# Patient Record
Sex: Male | Born: 1950
Health system: Southern US, Community
[De-identification: ages and names within clinical notes are randomized; demographics above are authoritative.]

## PROBLEM LIST (undated history)

## (undated) DIAGNOSIS — Z86718 Personal history of other venous thrombosis and embolism: Secondary | ICD-10-CM

## (undated) DIAGNOSIS — Z87442 Personal history of urinary calculi: Secondary | ICD-10-CM

## (undated) DIAGNOSIS — I499 Cardiac arrhythmia, unspecified: Secondary | ICD-10-CM

## (undated) DIAGNOSIS — E232 Diabetes insipidus: Secondary | ICD-10-CM

## (undated) DIAGNOSIS — Z950 Presence of cardiac pacemaker: Secondary | ICD-10-CM

## (undated) DIAGNOSIS — N183 Chronic kidney disease, stage 3 unspecified: Secondary | ICD-10-CM

## (undated) DIAGNOSIS — T56891A Toxic effect of other metals, accidental (unintentional), initial encounter: Secondary | ICD-10-CM

## (undated) DIAGNOSIS — F329 Major depressive disorder, single episode, unspecified: Secondary | ICD-10-CM

## (undated) DIAGNOSIS — E785 Hyperlipidemia, unspecified: Secondary | ICD-10-CM

## (undated) DIAGNOSIS — F32A Depression, unspecified: Secondary | ICD-10-CM

## (undated) DIAGNOSIS — Z86711 Personal history of pulmonary embolism: Secondary | ICD-10-CM

## (undated) DIAGNOSIS — F319 Bipolar disorder, unspecified: Secondary | ICD-10-CM

## (undated) HISTORY — DX: Presence of cardiac pacemaker: Z95.0

## (undated) HISTORY — DX: Cardiac arrhythmia, unspecified: I49.9

## (undated) HISTORY — DX: Personal history of urinary calculi: Z87.442

## (undated) HISTORY — PX: NOSE SURGERY: SHX723

## (undated) HISTORY — PX: SPINE SURGERY: SHX786

## (undated) HISTORY — DX: Diabetes insipidus: E23.2

## (undated) HISTORY — DX: Personal history of pulmonary embolism: Z86.711

## (undated) HISTORY — PX: BACK SURGERY: SHX140

## (undated) HISTORY — PX: CARDIAC SURGERY: SHX584

## (undated) HISTORY — PX: PACEMAKER PLACEMENT: SHX43

## (undated) HISTORY — DX: Hyperlipidemia, unspecified: E78.5

## (undated) HISTORY — DX: Personal history of other venous thrombosis and embolism: Z86.718

---

## 2004-01-02 ENCOUNTER — Emergency Department (HOSPITAL_COMMUNITY): Admission: EM | Admit: 2004-01-02 | Discharge: 2004-01-02 | Payer: Self-pay | Admitting: Emergency Medicine

## 2005-01-03 ENCOUNTER — Inpatient Hospital Stay (HOSPITAL_COMMUNITY): Admission: RE | Admit: 2005-01-03 | Discharge: 2005-01-09 | Payer: Self-pay | Admitting: Psychiatry

## 2005-01-03 ENCOUNTER — Ambulatory Visit: Payer: Self-pay | Admitting: Psychiatry

## 2005-01-03 ENCOUNTER — Emergency Department (HOSPITAL_COMMUNITY): Admission: EM | Admit: 2005-01-03 | Discharge: 2005-01-03 | Payer: Self-pay | Admitting: Emergency Medicine

## 2006-02-28 ENCOUNTER — Ambulatory Visit (HOSPITAL_COMMUNITY): Payer: Self-pay | Admitting: *Deleted

## 2006-04-19 ENCOUNTER — Ambulatory Visit (HOSPITAL_COMMUNITY): Payer: Self-pay | Admitting: *Deleted

## 2006-06-12 ENCOUNTER — Ambulatory Visit (HOSPITAL_COMMUNITY): Payer: Self-pay | Admitting: *Deleted

## 2006-08-21 ENCOUNTER — Ambulatory Visit (HOSPITAL_COMMUNITY): Payer: Self-pay | Admitting: *Deleted

## 2006-11-20 ENCOUNTER — Ambulatory Visit (HOSPITAL_COMMUNITY): Payer: Self-pay | Admitting: *Deleted

## 2007-02-19 ENCOUNTER — Ambulatory Visit (HOSPITAL_COMMUNITY): Payer: Self-pay | Admitting: *Deleted

## 2007-06-04 ENCOUNTER — Ambulatory Visit (HOSPITAL_COMMUNITY): Payer: Self-pay | Admitting: *Deleted

## 2007-09-03 ENCOUNTER — Ambulatory Visit (HOSPITAL_COMMUNITY): Payer: Self-pay | Admitting: *Deleted

## 2007-12-12 ENCOUNTER — Ambulatory Visit (HOSPITAL_COMMUNITY): Payer: Self-pay | Admitting: *Deleted

## 2008-03-10 ENCOUNTER — Ambulatory Visit (HOSPITAL_COMMUNITY): Payer: Self-pay | Admitting: *Deleted

## 2008-06-09 ENCOUNTER — Ambulatory Visit (HOSPITAL_COMMUNITY): Payer: Self-pay | Admitting: *Deleted

## 2008-11-19 ENCOUNTER — Ambulatory Visit: Payer: Self-pay

## 2008-11-23 ENCOUNTER — Ambulatory Visit: Payer: Self-pay

## 2008-11-30 ENCOUNTER — Ambulatory Visit: Payer: Self-pay

## 2009-01-26 ENCOUNTER — Ambulatory Visit (HOSPITAL_COMMUNITY): Payer: Self-pay | Admitting: *Deleted

## 2009-03-23 ENCOUNTER — Ambulatory Visit (HOSPITAL_COMMUNITY): Payer: Self-pay | Admitting: *Deleted

## 2009-06-16 DIAGNOSIS — I2510 Atherosclerotic heart disease of native coronary artery without angina pectoris: Secondary | ICD-10-CM | POA: Insufficient documentation

## 2009-07-26 ENCOUNTER — Ambulatory Visit (HOSPITAL_COMMUNITY): Payer: Self-pay | Admitting: Psychiatry

## 2009-08-30 ENCOUNTER — Ambulatory Visit (HOSPITAL_COMMUNITY): Payer: Self-pay | Admitting: Psychiatry

## 2009-10-25 ENCOUNTER — Ambulatory Visit (HOSPITAL_COMMUNITY): Payer: Self-pay | Admitting: Psychiatry

## 2009-12-22 ENCOUNTER — Ambulatory Visit (HOSPITAL_COMMUNITY): Payer: Self-pay | Admitting: Psychiatry

## 2010-03-07 ENCOUNTER — Ambulatory Visit (HOSPITAL_COMMUNITY): Payer: Self-pay | Admitting: Psychiatry

## 2010-05-25 ENCOUNTER — Ambulatory Visit (HOSPITAL_COMMUNITY): Payer: Self-pay | Admitting: Psychiatry

## 2010-08-08 ENCOUNTER — Ambulatory Visit (HOSPITAL_COMMUNITY): Payer: Self-pay | Admitting: Psychiatry

## 2010-08-15 ENCOUNTER — Ambulatory Visit: Payer: Self-pay | Admitting: Family Medicine

## 2010-10-04 ENCOUNTER — Ambulatory Visit: Payer: Self-pay | Admitting: Unknown Physician Specialty

## 2010-10-31 ENCOUNTER — Ambulatory Visit (HOSPITAL_COMMUNITY)
Admission: RE | Admit: 2010-10-31 | Discharge: 2010-10-31 | Payer: Self-pay | Source: Home / Self Care | Attending: Psychiatry | Admitting: Psychiatry

## 2010-12-09 ENCOUNTER — Ambulatory Visit: Payer: Self-pay | Admitting: Unknown Physician Specialty

## 2010-12-12 LAB — PATHOLOGY REPORT

## 2011-01-30 ENCOUNTER — Encounter (HOSPITAL_COMMUNITY): Payer: Medicare Other | Admitting: Psychiatry

## 2011-01-30 DIAGNOSIS — F3189 Other bipolar disorder: Secondary | ICD-10-CM

## 2011-03-03 NOTE — H&P (Signed)
Jeffrey Herring, Jeffrey Herring NO.:  [id number]   MEDICAL RECORD NO.:  [id number]          PATIENT TYPE:  IPS   LOCATION:  0400                          FACILITY:  BH   PHYSICIAN:  Carlton Adam, M.D.      DATE OF BIRTH:  17-Apr-1951   DATE OF ADMISSION:  01/03/2005  DATE OF DISCHARGE:                         PSYCHIATRIC ADMISSION ASSESSMENT   IDENTIFYING INFORMATION:  This is a 60 year old married white male who was  voluntarily admitted on January 03, 2005.   HISTORY OF PRESENT ILLNESS:  The patient presents with a history of  depression, catatonic features and was shutting down at home, hypersomnia,  decreased ADLs, staring into space.  The patient had seen his psychiatrist  and felt that patient required inpatient admission.  The patient reports  that he is just tired of being on so many medications in the past with her  side effects and states that his current medication, Xanax, is what works  the best.  He states that he is not stupid enough to hurt himself.  He has  had some problems with variations in his appetite and reports no reported  weight loss.  His stressors include patient reports that his daughter has a  speeding ticket.  The patient will need to pay for her lawyer fees.  He also  has some financial issues.  The patient denies any psychotic symptoms.  Denies any current suicidal or homicidal thoughts.  He does state that his  wife feels that he should be doing more and stating what, jumping jacks?  The patient states that he also does not feel like he needs to hear.   PAST PSYCHIATRIC HISTORY:  First admission to [facility]  No  other psychiatric admissions.  He sees Dr. Randye Lobo as an outpatient.  No history of a suicide attempt.   SOCIAL HISTORY:  This is a 60 year old married white male, married for 28  years, first marriage.  Lives with his wife, has two children, ages 49 and  44.  Unemployed.  Was a Engineer, structural in the past.   Attempting to get  social security, disability.  Recently moved from [place] to [place]  approximately one year ago.  Reports he has some family here, which is his  brother.   FAMILY HISTORY:  None.   ALCOHOL/DRUG HISTORY:  The patient smokes.  Denies any alcohol use.  Denies  any drug use although urine drug screen was positive for marijuana.   PRIMARY CARE PHYSICIAN:  None.   MEDICAL PROBLEMS:  The patient has a pacemaker that was placed in 1980s for  episodes of bradycardia.  He has no current cardiologist.  Denies any other  further medical problems.   MEDICATIONS:  Has been on Lexapro, Zoloft, Wellbutrin, Risperdal, Abilify  and reports many others in the past.  His Abilify was discontinued  approximately two days ago.  The patient was reporting some akathisia with  that.  He is currently on Xanax, taking up to three times a day.  He states  he may take an extra one every now and then  for his complaints of anxiety.   ALLERGIES:  ASPIRIN.   PHYSICAL EXAMINATION:  The patient was assessed at [facility].  He is a  middle-aged, well-nourished male.  A little pale.  His temperature is 97.3,  heart rate 95, respirations 20, blood pressure 129/79.  He is 171 pounds.   LABORATORY DATA:  His urine drug screen was positive for benzodiazepines,  positive for THC.  His platelet count is 108.  Alcohol level is less than 5.  His BMET was within normal limits.   MENTAL STATUS EXAM:  He is an alert, middle-aged, cooperative male.  Fair  eye contact.  Neat in appearance.  Sitting on the side of the bed.  There is  poverty of speech.  No initiating of any conversation.  The patient feels  anxious.  He is constricted, smiles sometimes inappropriately.  Thought  processes are coherent.  No evidence of psychosis.  Cognitive function  intact.  Memory is fair.  The patient gets his medication often confused  with other medications that he has been on in the past.  Judgment is fair.   Insight is fair.   DIAGNOSES:   AXIS I:  1.  Rule out mood disorder not otherwise specified.  2.  THC abuse.   AXIS II:  Deferred.   AXIS III:  Pacemaker.   AXIS IV:  Problems with economics, other psychosocial problems.   AXIS V:  Current 30-35; estimated this past year 62.   PLAN:  To stabilize mood and thinking.  The patient will initially be placed  on the [place] for close observation.  Will have a family session as soon  as possible with wife for concerns.  We will also repeat patient's CBC and  obtain a liver function test.  The patient is to follow up with Dr. Tamala Julian.   TENTATIVE LENGTH OF STAY:  Three to four days.      JO/MEDQ  D:  01/04/2005  T:  01/05/2005  Job:  [id number]

## 2011-03-03 NOTE — Discharge Summary (Signed)
Jeffrey Herring, NOVOSAD NO.:  [id number]   MEDICAL RECORD NO.:  [id number]          PATIENT TYPE:  IPS   LOCATION:  [id number]                          FACILITY:  BH   PHYSICIAN:  Carlton Adam, M.D.      DATE OF BIRTH:  November 10, 1950   DATE OF ADMISSION:  01/03/2005  DATE OF DISCHARGE:  01/09/2005                                 DISCHARGE SUMMARY   CHIEF COMPLAINT AND PRESENT ILLNESS:  This was the first admission to [facility] for this 60 year old married white male voluntarily  admitted.  History of depression, catatonic features, was shutting down at  home, hypersomnia, decreased ADLs, just staring in space.  Has seen a  psychiatrist.  Kristeen Miss that the patient required inpatient admission.  Endorsed  that he was just tired of being on so many medications in the past with the  side effects.  Endorsed that his current medication, Xanax, is what worked  the best.  Endorsed that he was not really wanting to help himself.  Endorsed decreased appetite but no weight loss.  He reported his stressors  that his daughter had a speeding ticket.  He will need to pay for her lawyer  fees.  Has some financial difficulties.  He did endorse that his wife felt  that she should be doing more.   PAST PSYCHIATRIC HISTORY:  First time at [facility].  No other  psychiatric admissions.  Seeing Dr. Randye Lobo on an outpatient basis.   ALCOHOL/DRUG HISTORY:  Denies any alcohol or substance use, although the  drug screen was positive for marijuana.   MEDICAL HISTORY:  Pacemaker placed in 1984, bradycardia.   MEDICATIONS:  Lexapro, Zoloft, Wellbutrin, Risperdal, Abilify and many  others in the past.  Abilify was discontinued two days prior to this  admission.  Reports some akathisia with that.  Currently on Xanax, taking it  three times a day.   PHYSICAL EXAMINATION:  Performed and failed to show any acute findings.   LABORATORY DATA:  Drug screen positive for  marijuana and benzodiazepines.  CBC within normal limits.  Blood chemistries within normal limits.  TSH  0.976.   MENTAL STATUS EXAM:  Alert male.  Cooperative.  Fair eye contact.  Neat in  appearance.  Sitting on the side of the bed.  Not as spontaneous, poverty of  speech, not initiating any conversation.  Some psychomotor retardation.  He  did endorse feeling anxious.  Affect was constricted but smiles sometimes  inappropriately.  Thought processes were logical, coherent and relevant,  although there were scars.  No evidence of delusions.  Endorsed no suicidal  or homicidal ideation.  No hallucinations.  Cognition was well-preserved.   ADMISSION DIAGNOSES:   AXIS I:  1.  Major depression.  2.  Anxiety disorder not otherwise specified.  3.  Marijuana abuse.   AXIS II:  No diagnosis.   AXIS III:  Pacemaker.   AXIS IV:  Moderate.   AXIS V:  Global Assessment of Functioning upon admission 30-35; highest  Global Assessment of Functioning in the last year 91.  HOSPITAL COURSE:  He was admitted and started in individual and group  psychotherapy.  He was given Ambien for sleep.  He was maintained on Xanax 1  mg three times a day and 1.5 mg at night.  Initially, he was going to be  detoxed from the Xanax but, given the fact that this was the only medication  that he felt was useful, we went ahead and kept it.  He was placed on Prozac  10 mg per day and he was started on lithium 300 mg twice a day.  He did  endorsed that he experienced severe anxiety and the Xanax is the only  medication that helps him.  He used to live in [place].  Came to this area  when he lost his job.  Became incapacitated.  He has not been able to  function in part secondary to all the medication that he had been trying  before.  Cogentin did help his presentation.  Thought that he was having  some side effects from the Abilify and it was discontinued.  We continued to  stabilize.  There was a family session  with the wife.  Wife reported that he  was on Zyprexa.  One year prior to this admission, he stopped taking it due  to improvement.  Approximately two days later, symptoms resembling a heart  attack started.  Depression seemed to have worsened.  At home, wife reported  that he was inactive, apathetic, unmotivated.  He was able to validate the  fact that he was more withdrawn and depressed as wife perceived him to be.  He was starting to be clearer.  Still with some psychomotor retardation.  Slow processing but willing to pursue the medication.  We started the Prozac  and the lithium.  Slowly, his mood improved.  His affect was somewhat  brighter, more spontaneous.  Able to initiate conversation.  Some concerns  about being in the unit.  Overall, he was improving.  On March 27th, he was  in full contact with reality.  There were no suicidal ideation, no homicidal  ideation, no hallucinations, no delusions.  He was willing and motivated to  pursue further outpatient treatment and pursue these medications further.   DISCHARGE DIAGNOSES:   AXIS I:  1.  Bipolar disorder, depressed.  2.  Anxiety disorder not otherwise specified.   AXIS II:  No diagnosis.   AXIS III:  Pacemaker.   AXIS IV:  Moderate.   AXIS V:  Global Assessment of Functioning upon discharge 50-55.   DISCHARGE MEDICATIONS:  1.  Xanax 1 mg three times a day and 1.5 mg at night.  2.  Prozac 10 mg daily.  3.  Lithium carbonate 300 mg, 1 twice a day.   FOLLOW UP:  [facility].      IL/MEDQ  D:  01/31/2005  T:  01/31/2005  Job:  [id number]

## 2011-05-01 ENCOUNTER — Encounter (INDEPENDENT_AMBULATORY_CARE_PROVIDER_SITE_OTHER): Payer: Medicare Other | Admitting: Psychiatry

## 2011-05-01 DIAGNOSIS — F3189 Other bipolar disorder: Secondary | ICD-10-CM

## 2011-05-22 ENCOUNTER — Encounter (HOSPITAL_COMMUNITY): Payer: Medicare Other | Admitting: Psychiatry

## 2011-05-31 ENCOUNTER — Encounter (INDEPENDENT_AMBULATORY_CARE_PROVIDER_SITE_OTHER): Payer: Medicare Other | Admitting: Psychiatry

## 2011-05-31 DIAGNOSIS — F3189 Other bipolar disorder: Secondary | ICD-10-CM

## 2011-07-26 ENCOUNTER — Encounter (HOSPITAL_COMMUNITY): Payer: Medicare Other | Admitting: Psychiatry

## 2011-07-31 ENCOUNTER — Encounter (INDEPENDENT_AMBULATORY_CARE_PROVIDER_SITE_OTHER): Payer: Medicare Other | Admitting: Psychiatry

## 2011-07-31 DIAGNOSIS — F3189 Other bipolar disorder: Secondary | ICD-10-CM

## 2011-09-01 ENCOUNTER — Other Ambulatory Visit (HOSPITAL_COMMUNITY): Payer: Self-pay | Admitting: Psychiatry

## 2011-09-27 ENCOUNTER — Ambulatory Visit (INDEPENDENT_AMBULATORY_CARE_PROVIDER_SITE_OTHER): Payer: Medicare Other | Admitting: Psychiatry

## 2011-09-27 ENCOUNTER — Encounter (HOSPITAL_COMMUNITY): Payer: Self-pay | Admitting: Psychiatry

## 2011-09-27 VITALS — Wt 195.0 lb

## 2011-09-27 DIAGNOSIS — F3189 Other bipolar disorder: Secondary | ICD-10-CM

## 2011-09-27 MED ORDER — TRAZODONE HCL 100 MG PO TABS: 100.0000 mg | ORAL_TABLET | Freq: Every day | ORAL | Status: DC

## 2011-09-27 MED ORDER — FLUOXETINE HCL 10 MG PO CAPS: 10.0000 mg | ORAL_CAPSULE | Freq: Every day | ORAL | Status: DC

## 2011-09-27 MED ORDER — ALPRAZOLAM 1 MG PO TABS: 1.0000 mg | ORAL_TABLET | Freq: Three times a day (TID) | ORAL | Status: DC | PRN

## 2011-09-27 MED ORDER — LITHIUM CARBONATE 300 MG PO CAPS: ORAL_CAPSULE | ORAL | Status: DC

## 2011-09-27 NOTE — Progress Notes (Signed)
Jeffrey [id number]  Subjective Patient came for his followup appointment with his wife. He continued to endorse poor sleep anxiety irritability and some mood swings. He is taking lithium 900 mg along with Prozac 10 mg and trazodone. Patient reported his granddaughter is doing better in gaining weight. He reported no side effects of medication. There no tremors shakes present today he has a blood work for his liver enzymes as patient is also taking simvastatin from his primary care physician.  Mental Status Examination Patient is casually dressed and fairly groomed. His speech is somewhat fast and rapid. He denies any auditory or visual hallucination. His attention and concentration is distracted. His thought process is fast but logical linear and goal-directed. He denies any active or passive suicidal thoughts or homicidal thoughts. There no psychotic symptoms present. Is alert and oriented x3. His insight judgment and impulse control is okay  Current Medication Current outpatient prescriptions:ALPRAZolam (XANAX) 1 MG tablet, Take 1 tablet (1 mg total) by mouth 3 (three) times daily as needed., Disp: 90 tablet, Rfl: 1;  FLUoxetine (PROZAC) 10 MG capsule, Take 1 capsule (10 mg total) by mouth daily., Disp: 30 capsule, Rfl: 1;  lithium carbonate 300 MG capsule, Take 2 am and 2 hs, Disp: 120 capsule, Rfl: 1 traZODone (DESYREL) 100 MG tablet, Take 1 tablet (100 mg total) by mouth at bedtime., Disp: 30 tablet, Rfl: 1   Assessment Bipolar disorder NOS  Plan I recommended to try increased dose of lithium 600 mg twice a day. So far he has been tolerating lithium without any side effects. His last lithium level was 0.7. I recommended increase lithium may helpless residual mood swing insomnia and anxiety. I recommended to call us if he is a question or concern about the medication or if he feel worsening of her symptoms. I have explained risks and benefits of medication. I will see him again in  4-6 weeks

## 2011-10-26 ENCOUNTER — Other Ambulatory Visit (HOSPITAL_COMMUNITY): Payer: Self-pay | Admitting: Psychiatry

## 2011-10-26 DIAGNOSIS — F3189 Other bipolar disorder: Secondary | ICD-10-CM

## 2011-10-27 ENCOUNTER — Other Ambulatory Visit (HOSPITAL_COMMUNITY): Payer: Self-pay | Admitting: Psychiatry

## 2011-11-24 ENCOUNTER — Other Ambulatory Visit (HOSPITAL_COMMUNITY): Payer: Self-pay | Admitting: Psychiatry

## 2011-11-26 ENCOUNTER — Other Ambulatory Visit (HOSPITAL_COMMUNITY): Payer: Self-pay | Admitting: Psychiatry

## 2011-11-28 ENCOUNTER — Other Ambulatory Visit (HOSPITAL_COMMUNITY): Payer: Self-pay | Admitting: Psychiatry

## 2011-12-12 DIAGNOSIS — Z950 Presence of cardiac pacemaker: Secondary | ICD-10-CM | POA: Diagnosis not present

## 2011-12-12 DIAGNOSIS — I2510 Atherosclerotic heart disease of native coronary artery without angina pectoris: Secondary | ICD-10-CM | POA: Diagnosis not present

## 2011-12-12 DIAGNOSIS — Z45018 Encounter for adjustment and management of other part of cardiac pacemaker: Secondary | ICD-10-CM | POA: Diagnosis not present

## 2011-12-12 DIAGNOSIS — Q246 Congenital heart block: Secondary | ICD-10-CM | POA: Diagnosis not present

## 2011-12-12 DIAGNOSIS — E7800 Pure hypercholesterolemia, unspecified: Secondary | ICD-10-CM | POA: Diagnosis not present

## 2011-12-27 ENCOUNTER — Encounter (HOSPITAL_COMMUNITY): Payer: Self-pay | Admitting: Psychiatry

## 2011-12-27 ENCOUNTER — Ambulatory Visit (INDEPENDENT_AMBULATORY_CARE_PROVIDER_SITE_OTHER): Payer: Medicare Other | Admitting: Psychiatry

## 2011-12-27 VITALS — BP 125/85 | HR 85 | Wt 188.0 lb

## 2011-12-27 DIAGNOSIS — F319 Bipolar disorder, unspecified: Secondary | ICD-10-CM

## 2011-12-27 MED ORDER — LITHIUM CARBONATE 300 MG PO CAPS: ORAL_CAPSULE | ORAL | Status: DC

## 2011-12-27 MED ORDER — TRAZODONE HCL 100 MG PO TABS: 100.0000 mg | ORAL_TABLET | Freq: Every day | ORAL | Status: DC

## 2011-12-27 MED ORDER — ALPRAZOLAM 1 MG PO TABS: 1.0000 mg | ORAL_TABLET | Freq: Three times a day (TID) | ORAL | Status: DC | PRN

## 2011-12-27 MED ORDER — FLUOXETINE HCL 10 MG PO CAPS: 10.0000 mg | ORAL_CAPSULE | Freq: Every day | ORAL | Status: DC

## 2011-12-27 NOTE — Progress Notes (Signed)
Jeffrey LehmanOctober 06, [id number]  Chief complaint Medication management  History of present illness Patient is 61 year old Caucasian married male who came with his wife for his followup appointment. Patient admitted noncompliance with his medication for one day and that resulted severe anger paranoid thinking and poor sleep. Patient admitted that he forgot to take his medication as he was very busy doing things. However overall he has been stable on his medication. I endorse that he has chronic insomnia otherwise his mood and thinking is much improved. He is taking his medication recently without any interruption. He denies any side effects of medication. He denies drinking or using any illegal substances. He has any agitation anger or any violent behavior.  Current psychiatric medication Xanax 1 mg 3 times a day Prozac 10 mg daily Lithium 300 mg 1 in the morning and 2 at bedtime Trazodone 100 mg at bedtime  Mental status examination Patient is casually dressed and fairly groomed. He is cooperative and maintained good eye contact. His speech is somewhat fast and rapid. He denies any auditory or visual hallucination. His attention and concentration is distracted. His thought process is fast but logical linear and goal-directed. He denies any active or passive suicidal thoughts or homicidal thoughts. There no psychotic symptoms present. Is alert and oriented x3. His insight judgment and impulse control is okay  Assessment Axis I Bipolar disorder NOS Axis II deferred Axis III hyperlipidemia Axis IV mild to moderate  Plan I will continue his current medication. He had been taking lithium 900 mg with better tolerance. He has any tremors or any side effects. I will continue his trazodone Xanax and Prozac. I have explained risks and benefits of medication. I recommended to call us if he has any question or concern about the medication or if he feel worsening of her symptoms. I will see him again in 8  weeks.

## 2012-01-19 ENCOUNTER — Other Ambulatory Visit (HOSPITAL_COMMUNITY): Payer: Self-pay | Admitting: Psychiatry

## 2012-02-28 ENCOUNTER — Encounter (HOSPITAL_COMMUNITY): Payer: Self-pay | Admitting: Psychiatry

## 2012-02-28 ENCOUNTER — Ambulatory Visit (INDEPENDENT_AMBULATORY_CARE_PROVIDER_SITE_OTHER): Payer: Medicare Other | Admitting: Psychiatry

## 2012-02-28 VITALS — BP 128/70 | HR 78 | Wt 191.2 lb

## 2012-02-28 DIAGNOSIS — Z79899 Other long term (current) drug therapy: Secondary | ICD-10-CM

## 2012-02-28 DIAGNOSIS — F319 Bipolar disorder, unspecified: Secondary | ICD-10-CM

## 2012-02-28 MED ORDER — LITHIUM CARBONATE 300 MG PO CAPS: ORAL_CAPSULE | ORAL | Status: DC

## 2012-02-28 MED ORDER — ALPRAZOLAM 1 MG PO TABS: 1.0000 mg | ORAL_TABLET | Freq: Three times a day (TID) | ORAL | Status: DC

## 2012-02-28 MED ORDER — TRAZODONE HCL 100 MG PO TABS: 100.0000 mg | ORAL_TABLET | Freq: Every day | ORAL | Status: DC

## 2012-02-28 MED ORDER — FLUOXETINE HCL 10 MG PO CAPS: 10.0000 mg | ORAL_CAPSULE | Freq: Every day | ORAL | Status: DC

## 2012-02-28 NOTE — Progress Notes (Signed)
Nickson Lehman01/30/[id number]  Chief complaint Medication management  History of present illness Patient is 61 year old Caucasian married male who came with his wife for his followup appointment.  Patient endorse recently he is having difficulty in breathing and feeling tired.  His wife also endorse some time patient feel very thirsty and exhausted.  Patient has not seen his primary care physician and a long time.  He is compliant with her psychiatric medication and denies any side effects.  He is sleeping better and try to keep himself busy in his daily activities.  He denies any agitation anger or mood swings.  He has chronic insomnia but he feel his psychiatric medication working very well.  He denies any aggression or violence.  He described his mood is stable however he is concerned about his physical health.  Patient has history of blood clot in his lungs which requires surgery.  He also has an arrhythmia and uses pacemaker for many years.  Sometime he gets anxious and nervous about his physical health .  He was taking Coumadin which was stopped by his cardiologist .  He denies any drinking or using any illegal substance.  He has no recent blood work .  His last lithium was done in August 2012 which was 0.87.  Current psychiatric medication Xanax 1 mg 3 times a day Prozac 10 mg daily Lithium 300 mg 1 in the morning and 2 at bedtime Trazodone 100 mg at bedtime  Past psychiatric history Patient has been seeing in this office since 2007.  He was admitted at behavioral Center in 2006 the teaching mental status and psychotic behavior.  In the past he has taken Zyprexa, Abilify, Risperdal, Lexapro, Wellbutrin.  Patient endorse history of agitation anger or poor impulse control and severe depression.  He's been stable on his current psychiatric medication since 2010.  Patient has a history of suicidal attempt.  Medical history Patient has history of arrhythmia, hyperlipidemia and blood clot.  He  is on pacemaker.  He was taking Coumadin but is stopped by his cardiologist.  Social history Patient moved from [place] with his wife heart he lost his job 9 years ago.  Patient wanted to live close to his children and that time they were studying at [employer].   Mental status examination Patient is casually dressed and fairly groomed. He is cooperative and maintained fair eye contact. His speech is somewhat fast and rapid. He denies any auditory or visual hallucination. His attention and concentration is distracted. His thought process is fast but logical linear and goal-directed. He denies any active or passive suicidal thoughts or homicidal thoughts. There no psychotic symptoms present. Is alert and oriented x3. His insight judgment and impulse control is okay  Assessment Axis I Bipolar disorder NOS Axis II deferred Axis III hyperlipidemia Axis IV mild to moderate  Plan I reviewed his history and psychosocial stressor.  I do believe patient need annual physical and complete blood work.  He is concerned about his physical health and complaining sometimes shortness or breath and feeling tired.  We will do blood work including lithium level and hemoglobin A1c.  However I also recommend him to see his primary care physician if his symptoms persist.  I will continue his current psychiatric medication and recommended to call us if his psychiatric symptoms get worse or if he has any question or concern about the medication.  I will see him again in 8 weeks .  Time spent 30 minutes.

## 2012-03-12 DIAGNOSIS — I442 Atrioventricular block, complete: Secondary | ICD-10-CM | POA: Diagnosis not present

## 2012-03-12 DIAGNOSIS — I2699 Other pulmonary embolism without acute cor pulmonale: Secondary | ICD-10-CM | POA: Diagnosis not present

## 2012-03-12 DIAGNOSIS — Z45018 Encounter for adjustment and management of other part of cardiac pacemaker: Secondary | ICD-10-CM | POA: Diagnosis not present

## 2012-03-12 DIAGNOSIS — Q246 Congenital heart block: Secondary | ICD-10-CM | POA: Diagnosis not present

## 2012-03-12 DIAGNOSIS — Z950 Presence of cardiac pacemaker: Secondary | ICD-10-CM | POA: Diagnosis not present

## 2012-03-12 DIAGNOSIS — I2510 Atherosclerotic heart disease of native coronary artery without angina pectoris: Secondary | ICD-10-CM | POA: Diagnosis not present

## 2012-04-20 ENCOUNTER — Other Ambulatory Visit (HOSPITAL_COMMUNITY): Payer: Self-pay | Admitting: Psychiatry

## 2012-04-20 DIAGNOSIS — F319 Bipolar disorder, unspecified: Secondary | ICD-10-CM

## 2012-04-22 NOTE — Telephone Encounter (Signed)
Refill requested for pharmacy benefit, "[facility] Refill". Last refill 04/23/12 per pharmacy, does not require medication at this time.

## 2012-04-25 DIAGNOSIS — Z79899 Other long term (current) drug therapy: Secondary | ICD-10-CM | POA: Diagnosis not present

## 2012-04-25 DIAGNOSIS — F319 Bipolar disorder, unspecified: Secondary | ICD-10-CM | POA: Diagnosis not present

## 2012-05-01 ENCOUNTER — Ambulatory Visit (INDEPENDENT_AMBULATORY_CARE_PROVIDER_SITE_OTHER): Payer: Medicare Other | Admitting: Psychiatry

## 2012-05-01 ENCOUNTER — Other Ambulatory Visit (HOSPITAL_COMMUNITY): Payer: Self-pay | Admitting: Psychiatry

## 2012-05-01 ENCOUNTER — Encounter (HOSPITAL_COMMUNITY): Payer: Self-pay | Admitting: Psychiatry

## 2012-05-01 VITALS — BP 146/78 | HR 85 | Wt 189.0 lb

## 2012-05-01 DIAGNOSIS — F319 Bipolar disorder, unspecified: Secondary | ICD-10-CM | POA: Insufficient documentation

## 2012-05-01 MED ORDER — TRAZODONE HCL 100 MG PO TABS: 100.0000 mg | ORAL_TABLET | Freq: Every day | ORAL | Status: DC

## 2012-05-01 MED ORDER — LITHIUM CARBONATE 300 MG PO CAPS: ORAL_CAPSULE | ORAL | Status: DC

## 2012-05-01 MED ORDER — ALPRAZOLAM 1 MG PO TABS: 1.0000 mg | ORAL_TABLET | Freq: Three times a day (TID) | ORAL | Status: DC

## 2012-05-01 MED ORDER — FLUOXETINE HCL 10 MG PO CAPS: 10.0000 mg | ORAL_CAPSULE | Freq: Every day | ORAL | Status: DC

## 2012-05-01 NOTE — Progress Notes (Signed)
Chief complaint I'm under stress to the family reason.  My son moved in 2 weeks ago.    History of present illness Patient is 61 year old Caucasian married male who came with his wife for his followup appointment.  Patient endorse increasing stress and frustration in recent weeks.  Apparently his son and future daughter-in-law moved in 2 weeks ago.  Patient admitted sometime getting frustrated irritable and moody however he realizes that the situation is temporary.  His son is moving out in August when apartment will be available.  Overall he is doing very well on medication.  He continues to have chronic insomnia but denies any recent aggression her manic symptoms.  He denies any side effects of medication.  He has any crying spells.  He continues to have some breathing problem but overall it has been less frequent.  He had a blood work on July 11th 2013 which shows lithium level 0.7.  Patient denies any tremors or any side effects.  He likes his current dose of lithium.  He is also taking Xanax as prescribed.  He denies any alcohol or illegal substance use.  He's not asking for early refills of Xanax.  Current psychiatric medication Xanax 1 mg 3 times a day Prozac 10 mg daily Lithium 300 mg 1 in the morning and 2 at bedtime Trazodone 100 mg at bedtime  Past psychiatric history Patient has been seeing in this office since 2007.  He was admitted at behavioral Center in 2006 the teaching mental status and psychotic behavior.  In the past he has taken Zyprexa, Abilify, Risperdal, Lexapro, Wellbutrin.  Patient endorse history of agitation anger or poor impulse control and severe depression.  He's been stable on his current psychiatric medication since 2010.  Patient has a history of suicidal attempt.  Medical history Patient has history of arrhythmia, hyperlipidemia and blood clot.  He is on pacemaker.    Social history Patient moved from [place] with his wife heart he lost his job 9 years ago.   Patient wanted to live close to his children and that time they were studying at [employer].   Mental status examination Patient is casually dressed and fairly groomed. He is cooperative and maintained fair eye contact. His speech is somewhat fast and rapid. He denies any auditory or visual hallucination. His attention and concentration is distracted. His thought process is fast but logical linear and goal-directed. He denies any active or passive suicidal thoughts or homicidal thoughts. There no psychotic symptoms present. Is alert and oriented x3. His insight judgment and impulse control is okay  Assessment Axis I Bipolar disorder NOS Axis II deferred Axis III hyperlipidemia Axis IV mild to moderate  Plan I reviewed his blood test results.  His creatinine is 1.29 his hemoglobin A1c is 5.5 and his lithium level is 0.7.  He is a normal CBC and liver enzymes.  I recommend for counseling however patient endorse that his situation is temporary and if he need any counseling he will let us know.  His wife is very involved in his treatment plan.  I will continue his current psychiatric medication .  I explained the side effects and benefits of medication.  I recommend to call us if he is a question or concern about the medication or if he feel worsening of the symptoms.  I will see him again in 3 months.  Time spent 30 minutes.  Portion of this note is generated with voice dictation software and may contain typographical error.

## 2012-05-02 ENCOUNTER — Other Ambulatory Visit (HOSPITAL_COMMUNITY): Payer: Self-pay | Admitting: *Deleted

## 2012-05-02 DIAGNOSIS — F319 Bipolar disorder, unspecified: Secondary | ICD-10-CM

## 2012-05-02 MED ORDER — FLUOXETINE HCL 10 MG PO CAPS: 10.0000 mg | ORAL_CAPSULE | Freq: Every day | ORAL | Status: DC

## 2012-05-02 MED ORDER — TRAZODONE HCL 100 MG PO TABS: 100.0000 mg | ORAL_TABLET | Freq: Every day | ORAL | Status: DC

## 2012-05-02 MED ORDER — LITHIUM CARBONATE 300 MG PO CAPS: ORAL_CAPSULE | ORAL | Status: DC

## 2012-05-02 NOTE — Telephone Encounter (Signed)
Patient requests refills from 7/17 be sent to [facility] instead of [facility]

## 2012-05-17 ENCOUNTER — Other Ambulatory Visit (HOSPITAL_COMMUNITY): Payer: Self-pay | Admitting: Psychiatry

## 2012-06-06 ENCOUNTER — Encounter: Payer: Self-pay | Admitting: Psychiatry

## 2012-06-13 DIAGNOSIS — R6883 Chills (without fever): Secondary | ICD-10-CM | POA: Diagnosis not present

## 2012-06-13 DIAGNOSIS — I442 Atrioventricular block, complete: Secondary | ICD-10-CM | POA: Diagnosis not present

## 2012-07-09 ENCOUNTER — Other Ambulatory Visit (HOSPITAL_COMMUNITY): Payer: Self-pay | Admitting: Psychiatry

## 2012-07-09 DIAGNOSIS — F319 Bipolar disorder, unspecified: Secondary | ICD-10-CM

## 2012-08-02 ENCOUNTER — Encounter (HOSPITAL_COMMUNITY): Payer: Self-pay | Admitting: Psychiatry

## 2012-08-02 ENCOUNTER — Ambulatory Visit (INDEPENDENT_AMBULATORY_CARE_PROVIDER_SITE_OTHER): Payer: Medicare Other | Admitting: Psychiatry

## 2012-08-02 DIAGNOSIS — F319 Bipolar disorder, unspecified: Secondary | ICD-10-CM | POA: Diagnosis not present

## 2012-08-02 MED ORDER — ALPRAZOLAM 1 MG PO TABS: 1.0000 mg | ORAL_TABLET | Freq: Three times a day (TID) | ORAL | Status: DC

## 2012-08-02 MED ORDER — TRAZODONE HCL 100 MG PO TABS: 100.0000 mg | ORAL_TABLET | Freq: Every day | ORAL | Status: DC

## 2012-08-02 MED ORDER — FLUOXETINE HCL 10 MG PO CAPS: 10.0000 mg | ORAL_CAPSULE | Freq: Every day | ORAL | Status: DC

## 2012-08-02 MED ORDER — LITHIUM CARBONATE 300 MG PO CAPS: ORAL_CAPSULE | ORAL | Status: DC

## 2012-08-02 NOTE — Progress Notes (Signed)
Chief complaint Medication management and followup.     History of present illness Patient came for her followup appointment with his wife.  He stable on his current psychiatric medication.  There has been no new medication added.  His daughter is doing very well.  His granddaughter is also doing very well however she may require an her heart surgery in 2 months.  Overall patient is doing better on his medication.  He denies any agitation anger or any mania.  He is sleeping fair.  He has a chronic insomnia .  He's not drinking or using any illegal substance.   He agitation or aggression.  Wife endorse that he's been stable on his medication.  He takes Xanax but never ask for early refills.  Current psychiatric medication Xanax 1 mg 3 times a day Prozac 10 mg daily Lithium 300 mg 1 in the morning and 2 at bedtime Trazodone 100 mg at bedtime  Past psychiatric history Patient has been seeing in this office since 2007.  He was admitted at behavioral Center in 2006 the teaching mental status and psychotic behavior.  In the past he has taken Zyprexa, Abilify, Risperdal, Lexapro, Wellbutrin.  Patient endorse history of agitation anger or poor impulse control and severe depression.  He's been stable on his current psychiatric medication since 2010.  Patient has a history of suicidal attempt.  Medical history Patient has history of arrhythmia, hyperlipidemia and blood clot.  He is on pacemaker.    Social history Patient moved from [place] with his wife heart he lost his job 9 years ago.  Patient wanted to live close to his children and that time they were studying at [employer].   Mental status examination Patient is casually dressed and fairly groomed. He is cooperative and maintained fair eye contact. His speech is clear and coherent.  He denies any auditory or visual hallucination. His attention and concentration is distracted. His thought process is fast but logical linear and goal-directed. He denies  any active or passive suicidal thoughts or homicidal thoughts. There no psychotic symptoms present. Is alert and oriented x3. His insight judgment and impulse control is okay  Assessment Axis I Bipolar disorder NOS Axis II deferred Axis III hyperlipidemia Axis IV mild to moderate  Plan I will continue his current psychiatric medication.  I explain in detail the risk and benefits of medication.  I recommend to call us if he is any question or concern or if he feel worsening of the symptom.  Followup in 3 months.  Portion of this note is generated with voice dictation software and may contain typographical error.

## 2012-08-06 ENCOUNTER — Other Ambulatory Visit (HOSPITAL_COMMUNITY): Payer: Self-pay | Admitting: Psychiatry

## 2012-09-05 ENCOUNTER — Other Ambulatory Visit (HOSPITAL_COMMUNITY): Payer: Self-pay | Admitting: Psychiatry

## 2012-09-27 DIAGNOSIS — Z23 Encounter for immunization: Secondary | ICD-10-CM | POA: Diagnosis not present

## 2012-09-30 DIAGNOSIS — Z86711 Personal history of pulmonary embolism: Secondary | ICD-10-CM | POA: Insufficient documentation

## 2012-09-30 DIAGNOSIS — Z8679 Personal history of other diseases of the circulatory system: Secondary | ICD-10-CM | POA: Insufficient documentation

## 2012-09-30 DIAGNOSIS — Z950 Presence of cardiac pacemaker: Secondary | ICD-10-CM | POA: Insufficient documentation

## 2012-10-01 DIAGNOSIS — I10 Essential (primary) hypertension: Secondary | ICD-10-CM | POA: Diagnosis not present

## 2012-10-01 DIAGNOSIS — I442 Atrioventricular block, complete: Secondary | ICD-10-CM | POA: Diagnosis not present

## 2012-10-01 DIAGNOSIS — I2510 Atherosclerotic heart disease of native coronary artery without angina pectoris: Secondary | ICD-10-CM | POA: Diagnosis not present

## 2012-10-01 DIAGNOSIS — Z45018 Encounter for adjustment and management of other part of cardiac pacemaker: Secondary | ICD-10-CM | POA: Diagnosis not present

## 2012-10-02 ENCOUNTER — Encounter (HOSPITAL_COMMUNITY): Payer: Self-pay

## 2012-10-02 ENCOUNTER — Ambulatory Visit (HOSPITAL_COMMUNITY): Payer: Medicare Other | Admitting: Psychiatry

## 2012-10-03 ENCOUNTER — Ambulatory Visit (INDEPENDENT_AMBULATORY_CARE_PROVIDER_SITE_OTHER): Payer: Medicare Other | Admitting: Psychiatry

## 2012-10-03 ENCOUNTER — Encounter (HOSPITAL_COMMUNITY): Payer: Self-pay | Admitting: Psychiatry

## 2012-10-03 DIAGNOSIS — F319 Bipolar disorder, unspecified: Secondary | ICD-10-CM

## 2012-10-03 DIAGNOSIS — Z79899 Other long term (current) drug therapy: Secondary | ICD-10-CM

## 2012-10-03 MED ORDER — TRAZODONE HCL 100 MG PO TABS: 100.0000 mg | ORAL_TABLET | Freq: Every day | ORAL | Status: DC

## 2012-10-03 MED ORDER — LITHIUM CARBONATE 300 MG PO CAPS: ORAL_CAPSULE | ORAL | Status: DC

## 2012-10-03 MED ORDER — ALPRAZOLAM 1 MG PO TABS: 1.0000 mg | ORAL_TABLET | Freq: Three times a day (TID) | ORAL | Status: DC

## 2012-10-03 MED ORDER — FLUOXETINE HCL 10 MG PO CAPS: 10.0000 mg | ORAL_CAPSULE | Freq: Every day | ORAL | Status: DC

## 2012-10-03 NOTE — Progress Notes (Signed)
Patient ID: Ardi Alling, male   DOB: [birth year], 61 y.o.   MRN: [id number] Chief complaint Medication management and followup.     History of present illness Patient came for her followup appointment by himself.  Which he usually comes with his wife but she did not come today.  Patient told she was diagnosed with breast cancer and yesterday she had a chemotherapy.  Patient and his wife is very upset with this news.  However there handling this news better than they thought.  Patient is compliant with the medication.  Denies any side effects or any issues with the medication.  He has chronic insomnia but he feel his current medications are working well.  He denies any irritability anger or any agitation.  There were no tremors or shakes present.  His daughter and granddaughter are also doing very well.  He's not drinking or using any illegal substance.  He does not ask for early refill of Xanax or abuses his benzodiazepine.  Current psychiatric medication Xanax 1 mg 3 times a day Prozac 10 mg daily Lithium 300 mg 1 in the morning and 2 at bedtime Trazodone 100 mg at bedtime  Past psychiatric history Patient has been seeing in this office since 2007.  He was admitted at behavioral Center in 2006 the teaching mental status and psychotic behavior.  In the past he has taken Zyprexa, Abilify, Risperdal, Lexapro, Wellbutrin.  Patient endorse history of agitation anger or poor impulse control and severe depression.  He's been stable on his current psychiatric medication since 2010.  Patient has a history of suicidal attempt.  Medical history Patient has history of arrhythmia, hyperlipidemia and blood clot.  He is on pacemaker.    Social history Patient moved from [place] with his wife heart he lost his job 9 years ago.  Patient wanted to live close to his children and that time they were studying at [employer].   Review of Systems  Psychiatric/Behavioral: Negative for suicidal ideas and substance abuse.  The patient is nervous/anxious and has insomnia.    Mental status examination Patient is casually dressed and fairly groomed. He is cooperative and maintained fair eye contact. His speech is clear and coherent.  He denies any auditory or visual hallucination. His attention and concentration is fair.  His psychomotor activity is normal.  His thought process is fast but logical linear and goal-directed. He denies any active or passive suicidal thoughts or homicidal thoughts. There no psychotic symptoms present. Is alert and oriented x3. His insight judgment and impulse control is okay  Assessment Axis I Bipolar disorder NOS Axis II deferred Axis III hyperlipidemia Axis IV mild to moderate  Plan I will continue his current psychiatric medication.  I explain in detail the risk and benefits of medication.  I recommend to call us if he is any question or concern or if he feel worsening of the symptom.  He has not done lithium level in a while.  We will do lithium and hemoglobin A1c.  Followup in 3 months.  Portion of this note is generated with voice dictation software and may contain typographical error.

## 2012-10-08 ENCOUNTER — Other Ambulatory Visit (HOSPITAL_COMMUNITY): Payer: Self-pay | Admitting: Psychiatry

## 2012-10-10 ENCOUNTER — Other Ambulatory Visit (HOSPITAL_COMMUNITY): Payer: Self-pay | Admitting: Psychiatry

## 2012-10-14 ENCOUNTER — Other Ambulatory Visit (HOSPITAL_COMMUNITY): Payer: Self-pay | Admitting: Psychiatry

## 2012-10-24 ENCOUNTER — Other Ambulatory Visit (HOSPITAL_COMMUNITY): Payer: Self-pay | Admitting: Psychiatry

## 2012-10-24 NOTE — Telephone Encounter (Signed)
Gave print RX 10/03/12 for 30 day supplyw/2refill.Contacted Clarise Cruz in pharmacy, she states they do not have RX from 10/03/12 on file and will contact pt to paper copy with next refill request

## 2012-12-24 DIAGNOSIS — R350 Frequency of micturition: Secondary | ICD-10-CM | POA: Diagnosis not present

## 2012-12-24 DIAGNOSIS — Z1159 Encounter for screening for other viral diseases: Secondary | ICD-10-CM | POA: Diagnosis not present

## 2012-12-24 DIAGNOSIS — R7309 Other abnormal glucose: Secondary | ICD-10-CM | POA: Diagnosis not present

## 2012-12-24 DIAGNOSIS — Z79899 Other long term (current) drug therapy: Secondary | ICD-10-CM | POA: Diagnosis not present

## 2012-12-24 DIAGNOSIS — I2510 Atherosclerotic heart disease of native coronary artery without angina pectoris: Secondary | ICD-10-CM | POA: Diagnosis not present

## 2012-12-24 DIAGNOSIS — Z125 Encounter for screening for malignant neoplasm of prostate: Secondary | ICD-10-CM | POA: Diagnosis not present

## 2013-01-01 ENCOUNTER — Ambulatory Visit (INDEPENDENT_AMBULATORY_CARE_PROVIDER_SITE_OTHER): Payer: Medicare Other | Admitting: Psychiatry

## 2013-01-01 ENCOUNTER — Encounter (HOSPITAL_COMMUNITY): Payer: Self-pay | Admitting: Psychiatry

## 2013-01-01 VITALS — BP 125/71 | HR 81 | Wt 175.4 lb

## 2013-01-01 DIAGNOSIS — F319 Bipolar disorder, unspecified: Secondary | ICD-10-CM | POA: Diagnosis not present

## 2013-01-01 MED ORDER — ALPRAZOLAM 1 MG PO TABS: 1.0000 mg | ORAL_TABLET | Freq: Three times a day (TID) | ORAL | Status: DC

## 2013-01-01 MED ORDER — LITHIUM CARBONATE 300 MG PO CAPS: ORAL_CAPSULE | ORAL | Status: DC

## 2013-01-01 MED ORDER — FLUOXETINE HCL 10 MG PO CAPS: 10.0000 mg | ORAL_CAPSULE | Freq: Every day | ORAL | Status: DC

## 2013-01-01 MED ORDER — TRAZODONE HCL 100 MG PO TABS: 100.0000 mg | ORAL_TABLET | Freq: Every day | ORAL | Status: DC

## 2013-01-01 NOTE — Progress Notes (Signed)
Jeffrey Herring [phone] Progress Note  Jeffrey Herring [id number] 62 y.o.  01/01/2013 2:38 PM  Chief Complaint: I'm having nausea.  My physician believe I have a large prostate.  History of Present Illness: Patient is 62 year old Caucasian male who came for his followup appointment.  He has noticed increased urination, nausea and insomnia for past 2 weeks.  He has seen his primary care physician who told him that he had a large prostate.  He is given ciprofloxacin and Fosamax.  He has not seen any improvement.  He has blood work last week but we do not have any results available.  I review his lithium level and hemoglobin A 1C.  His hemoglobin A1c is 5.6 and his lithium level is 1.5.  Patient complained sometime mild tremors but he denies any other side effects of lithium toxicity.  He continues to have chronic insomnia and irritability but denies any recent agitation anger mood swing.  His daughter and granddaughter are doing very well.  He usually comes with his wife however his wife recently fell and could not come with him on his appointment.  Patient is not drinking alcohol or using any illegal substance.  He does not ask for early refills of Xanax.  Suicidal Ideation: No Plan Formed: No Patient has means to carry out plan: No  Homicidal Ideation: No Plan Formed: No Patient has means to carry out plan: No  Review of Systems: Psychiatric: Agitation: No Hallucination: No Depressed Mood: No Insomnia: Yes Hypersomnia: No Altered Concentration: No Feels Worthless: No Grandiose Ideas: No Belief In Special Powers: No New/Increased Substance Abuse: No Compulsions: No  Neurologic: Headache: No Seizure: No Paresthesias: No  Medical History; Patient has history of arrhythmia, hyperlipidemia and blood clot.  He is on pacemaker.  He sees Dr. Jeannie Herring at [facility] in [place].  He was recently diagnosed with BPH.  Family and Social History: Patient was born and raised in [place].   He move from [place] with his wife 10 years ago.  He wanted to live close to his children.  Outpatient Encounter Prescriptions as of 01/01/2013  Medication Sig Dispense Refill  . ALPRAZolam (XANAX) 1 MG tablet Take 1 tablet (1 mg total) by mouth 3 (three) times daily.  90 tablet  0  . FLUoxetine (PROZAC) 10 MG capsule Take 1 capsule (10 mg total) by mouth daily.  30 capsule  0  . lithium carbonate 300 MG capsule Take 1 in am and hs  60 capsule  0  . simvastatin (ZOCOR) 10 MG tablet Take 10 mg by mouth at bedtime.      . traZODone (DESYREL) 100 MG tablet Take 1 tablet (100 mg total) by mouth at bedtime.  30 tablet  2  . [DISCONTINUED] ALPRAZolam (XANAX) 1 MG tablet Take 1 tablet (1 mg total) by mouth 3 (three) times daily.  90 tablet  2  . [DISCONTINUED] FLUoxetine (PROZAC) 10 MG capsule Take 1 capsule (10 mg total) by mouth daily.  30 capsule  2  . [DISCONTINUED] lithium carbonate 300 MG capsule Take 1 in am and 2 at hs  90 capsule  2  . [DISCONTINUED] traZODone (DESYREL) 100 MG tablet Take 1 tablet (100 mg total) by mouth at bedtime.  30 tablet  2   No facility-administered encounter medications on file as of 01/01/2013.    Past Psychiatric History/Hospitalization(s): Anxiety: Yes Bipolar Disorder: Yes Depression: Yes Mania: Yes Psychosis: Yes Schizophrenia: No Personality Disorder: No Hospitalization for psychiatric illness: Patient is been  seeing in this office since 2007.  He was admitted at behavioral Center in 2006 due to psychotic episode.  Patient has a history of anger mood swing and poor impulse control.  In the past he has taken Zyprexa, Abilify, Risperdal, Lexapro and Wellbutrin.  He's stable on his medication since 2010. History of Electroconvulsive Shock Therapy: No Prior Suicide Attempts: Yes  Physical Exam: Constitutional:  BP 125/71  Pulse 81  Wt 175 lb 6.4 oz (79.561 kg)  General Appearance: alert, oriented, no acute distress, well nourished and Casually dressed.   He maintained fair eye contact.  Musculoskeletal: Strength & Muscle Tone: within normal limits Gait & Station: normal Patient leans: N/A  Psychiatric: Speech (describe rate, volume, coherence, spontaneity, and abnormalities if any): Clear and coherent  Thought Process (describe rate, content, abstract reasoning, and computation): Goal-directed  Associations: Coherent, Relevant and Intact  Thoughts: normal  Mental Status: Orientation: oriented to person, place, time/date and situation Mood & Affect: Anxious.  His psychomotor activity is normal. Attention Span & Concentration: Fair  Medical Decision Making (Choose Three): Established Problem, Stable/Improving (1), New problem, with additional work up planned, Review of Psycho-Social Stressors (1), Review or order clinical lab tests (1), Decision to obtain old records (1), Review of Medication Regimen & Side Effects (2) and Review of New Medication or Change in Dosage (2)  Assessment: Axis I: Bipolar disorder NOS  Axis II: Deferred  Axis III: See medical history  Axis IV: Mild to moderate  Axis V: 60-65   Plan: I. review his symptoms, response of medication and recent concerned about his insomnia and frequent urination.  His lithium level is 1.5 which is slightly increased.  I recommend to cut down his lithium to 300 mg twice a day, to see if symptoms of frequent urination and nausea resolved.  We will also get his records from his primary care physician about his recent medication and blood work.  I will continue his Xanax, trazodone and Prozac at present does.  I will see him again in 4 weeks.  Xanax and lithium was given for 30 days only.  Risk and benefit explain.  Recommend to call us back if he is any question or concern he feel worsening of the symptom.  Time spent 25 minutes.  Jeffrey Barnwell T., MD 01/01/2013

## 2013-01-13 DIAGNOSIS — Z45018 Encounter for adjustment and management of other part of cardiac pacemaker: Secondary | ICD-10-CM | POA: Diagnosis not present

## 2013-01-13 DIAGNOSIS — I442 Atrioventricular block, complete: Secondary | ICD-10-CM | POA: Diagnosis not present

## 2013-01-22 DIAGNOSIS — E785 Hyperlipidemia, unspecified: Secondary | ICD-10-CM | POA: Diagnosis not present

## 2013-01-22 DIAGNOSIS — N183 Chronic kidney disease, stage 3 unspecified: Secondary | ICD-10-CM | POA: Diagnosis not present

## 2013-01-24 ENCOUNTER — Encounter: Payer: Self-pay | Admitting: Psychiatry

## 2013-02-06 ENCOUNTER — Ambulatory Visit (INDEPENDENT_AMBULATORY_CARE_PROVIDER_SITE_OTHER): Payer: Medicare Other | Admitting: Psychiatry

## 2013-02-06 ENCOUNTER — Encounter (HOSPITAL_COMMUNITY): Payer: Self-pay | Admitting: Psychiatry

## 2013-02-06 VITALS — BP 129/73 | HR 65 | Wt 176.0 lb

## 2013-02-06 DIAGNOSIS — F319 Bipolar disorder, unspecified: Secondary | ICD-10-CM

## 2013-02-06 MED ORDER — ALPRAZOLAM 1 MG PO TABS: 1.0000 mg | ORAL_TABLET | Freq: Three times a day (TID) | ORAL | Status: DC

## 2013-02-06 MED ORDER — FLUOXETINE HCL 10 MG PO CAPS: 10.0000 mg | ORAL_CAPSULE | Freq: Every day | ORAL | Status: DC

## 2013-02-06 MED ORDER — LITHIUM CARBONATE 300 MG PO CAPS: ORAL_CAPSULE | ORAL | Status: DC

## 2013-02-06 NOTE — Progress Notes (Signed)
[place] [phone] Progress Note  Jeffrey Herring [id number] 62 y.o.  02/06/2013 2:08 PM  Chief Complaint:  I still have frequent urination and taking medicine for prostate.    History of Present Illness: Patient is 62 year old Caucasian male who came for his followup appointment.  On his last visit we decreased the lithium as his lithium level is 1.5 and he was having frequent urination .  He is taking lithium 300 mg twice a day.  He still has frequent urination however his primary care physician who started him on prostate medication but patient do not remember the name.  He said he has notice some irritability depression with lowering of lithium but now he is having the dose better.  I offer Lamictal but patient does not want to change his psychiatric medication.  He likes lithium and Prozac combination.  He denies any agitation anger mood swing.  His concern about his wife who is going through chemotherapy.  He denies any aggression violence or any suicidal thinking.  There were no tremors or shakes present.  Is not drinking or using any illegal substance.  Suicidal Ideation: No Plan Formed: No Patient has means to carry out plan: No  Homicidal Ideation: No Plan Formed: No Patient has means to carry out plan: No  Review of Systems: Psychiatric: Agitation: No Hallucination: No Depressed Mood: No Insomnia: Yes Hypersomnia: No Altered Concentration: No Feels Worthless: No Grandiose Ideas: No Belief In Special Powers: No New/Increased Substance Abuse: No Compulsions: No  Neurologic: Headache: No Seizure: No Paresthesias: No  Medical History; Patient has history of arrhythmia, hyperlipidemia and blood clot.  He is on pacemaker.  He sees Dr. Jeannie Herring at [facility] in [place].  He was recently diagnosed with BPH.  Family and Social History: Patient was born and raised in [place].  He move from [place] with his wife 10 years ago.  He wanted to live close to his  children.  Outpatient Encounter Prescriptions as of 02/06/2013  Medication Sig Dispense Refill  . ALPRAZolam (XANAX) 1 MG tablet Take 1 tablet (1 mg total) by mouth 3 (three) times daily.  90 tablet  1  . FLUoxetine (PROZAC) 10 MG capsule Take 1 capsule (10 mg total) by mouth daily.  30 capsule  1  . lithium carbonate 300 MG capsule Take 1 in am and hs  60 capsule  1  . simvastatin (ZOCOR) 10 MG tablet Take 10 mg by mouth at bedtime.      . traZODone (DESYREL) 100 MG tablet Take 1 tablet (100 mg total) by mouth at bedtime.  30 tablet  2  . [DISCONTINUED] ALPRAZolam (XANAX) 1 MG tablet Take 1 tablet (1 mg total) by mouth 3 (three) times daily.  90 tablet  0  . [DISCONTINUED] FLUoxetine (PROZAC) 10 MG capsule Take 1 capsule (10 mg total) by mouth daily.  30 capsule  0  . [DISCONTINUED] lithium carbonate 300 MG capsule Take 1 in am and hs  60 capsule  0   No facility-administered encounter medications on file as of 02/06/2013.    Past Psychiatric History/Hospitalization(s): Anxiety: Yes Bipolar Disorder: Yes Depression: Yes Mania: Yes Psychosis: Yes Schizophrenia: No Personality Disorder: No Hospitalization for psychiatric illness: Patient is been seeing in this office since 2007.  He was admitted at behavioral Center in 2006 due to psychotic episode.  Patient has a history of anger mood swing and poor impulse control.  In the past he has taken Zyprexa, Abilify, Risperdal, Lexapro and Wellbutrin.  He's stable on his medication since 2010. History of Electroconvulsive Shock Therapy: No Prior Suicide Attempts: Yes  Physical Exam: Constitutional:  BP 129/73  Pulse 65  Wt 176 lb (79.833 kg)  General Appearance: alert, oriented, no acute distress, well nourished and Casually dressed.  He maintained fair eye contact.  Musculoskeletal: Strength & Muscle Tone: within normal limits Gait & Station: normal Patient leans: N/A  Psychiatric: Speech (describe rate, volume, coherence,  spontaneity, and abnormalities if any): Clear and coherent  Thought Process (describe rate, content, abstract reasoning, and computation): Goal-directed  Associations: Coherent, Relevant and Intact  Thoughts: normal  Mental Status: Orientation: oriented to person, place, time/date and situation Mood & Affect: Anxious.  His psychomotor activity is normal. Attention Span & Concentration: Fair  Medical Decision Making (Choose Three): Established Problem, Stable/Improving (1), Review of Psycho-Social Stressors (1), Decision to obtain old records (1) and Review of Medication Regimen & Side Effects (2)  Assessment: Axis I: Bipolar disorder NOS  Axis II: Deferred  Axis III: See medical history  Axis IV: Mild to moderate  Axis V: 60-65   Plan:  I will continue his current psychiatric medication which is Xanax , Prozac 10 mg, lithium 300 mg twice a day and trazodone.  I recommend to call us if he started to feel worsening of the symptoms.  I also recommend to call us back with the prostate medication which he started recently .  I will see him again in 2 months.  Explained risks and benefits of medication. Jeffrey Herring T., MD 02/06/2013

## 2013-02-13 ENCOUNTER — Telehealth (HOSPITAL_COMMUNITY): Payer: Self-pay | Admitting: *Deleted

## 2013-02-13 DIAGNOSIS — I2510 Atherosclerotic heart disease of native coronary artery without angina pectoris: Secondary | ICD-10-CM | POA: Diagnosis not present

## 2013-02-13 DIAGNOSIS — I442 Atrioventricular block, complete: Secondary | ICD-10-CM | POA: Diagnosis not present

## 2013-02-13 DIAGNOSIS — R0602 Shortness of breath: Secondary | ICD-10-CM | POA: Diagnosis not present

## 2013-02-13 DIAGNOSIS — I10 Essential (primary) hypertension: Secondary | ICD-10-CM | POA: Diagnosis not present

## 2013-02-13 DIAGNOSIS — Z950 Presence of cardiac pacemaker: Secondary | ICD-10-CM | POA: Diagnosis not present

## 2013-02-13 DIAGNOSIS — Q246 Congenital heart block: Secondary | ICD-10-CM | POA: Diagnosis not present

## 2013-02-13 DIAGNOSIS — Z45018 Encounter for adjustment and management of other part of cardiac pacemaker: Secondary | ICD-10-CM | POA: Diagnosis not present

## 2013-02-13 NOTE — Telephone Encounter (Signed)
Spouse left [id number] appt 4/24,husband given 3 printed prescriptions.Trazodone was not one of them.Was it sent directly to pharmacy?  Contacted spouse. Left message: According to pt chart, on 4/24, pt received printed prescriptions for Xanax, Prozac and Lithium. A printed prescription for Trazodone was given on 3/19, for a 30 day supply with 2 refills.Advised spouse that it was possible that prescription had been turned in and was on file waiting for activation, and that she could check with pharmacy to see if that was the case.

## 2013-02-24 DIAGNOSIS — R358 Other polyuria: Secondary | ICD-10-CM | POA: Diagnosis not present

## 2013-02-24 DIAGNOSIS — R350 Frequency of micturition: Secondary | ICD-10-CM | POA: Diagnosis not present

## 2013-02-24 DIAGNOSIS — N179 Acute kidney failure, unspecified: Secondary | ICD-10-CM | POA: Diagnosis not present

## 2013-02-24 DIAGNOSIS — R3589 Other polyuria: Secondary | ICD-10-CM | POA: Diagnosis not present

## 2013-02-27 ENCOUNTER — Other Ambulatory Visit (HOSPITAL_COMMUNITY): Payer: Self-pay | Admitting: Psychiatry

## 2013-02-28 ENCOUNTER — Other Ambulatory Visit (HOSPITAL_COMMUNITY): Payer: Self-pay | Admitting: Psychiatry

## 2013-02-28 NOTE — Telephone Encounter (Signed)
Given on 02/06/13 with one more refill. Too soon to refill.

## 2013-03-03 DIAGNOSIS — R0602 Shortness of breath: Secondary | ICD-10-CM | POA: Diagnosis not present

## 2013-03-03 DIAGNOSIS — Z950 Presence of cardiac pacemaker: Secondary | ICD-10-CM | POA: Diagnosis not present

## 2013-03-03 DIAGNOSIS — R9431 Abnormal electrocardiogram [ECG] [EKG]: Secondary | ICD-10-CM | POA: Diagnosis not present

## 2013-03-03 DIAGNOSIS — Q246 Congenital heart block: Secondary | ICD-10-CM | POA: Diagnosis not present

## 2013-03-03 DIAGNOSIS — I2510 Atherosclerotic heart disease of native coronary artery without angina pectoris: Secondary | ICD-10-CM | POA: Diagnosis not present

## 2013-03-03 DIAGNOSIS — R5381 Other malaise: Secondary | ICD-10-CM | POA: Diagnosis not present

## 2013-03-03 DIAGNOSIS — I10 Essential (primary) hypertension: Secondary | ICD-10-CM | POA: Diagnosis not present

## 2013-03-06 DIAGNOSIS — R358 Other polyuria: Secondary | ICD-10-CM | POA: Diagnosis not present

## 2013-03-06 DIAGNOSIS — R3589 Other polyuria: Secondary | ICD-10-CM | POA: Diagnosis not present

## 2013-03-06 DIAGNOSIS — N179 Acute kidney failure, unspecified: Secondary | ICD-10-CM | POA: Diagnosis not present

## 2013-03-20 DIAGNOSIS — N400 Enlarged prostate without lower urinary tract symptoms: Secondary | ICD-10-CM | POA: Diagnosis not present

## 2013-03-20 DIAGNOSIS — N179 Acute kidney failure, unspecified: Secondary | ICD-10-CM | POA: Diagnosis not present

## 2013-03-20 DIAGNOSIS — N281 Cyst of kidney, acquired: Secondary | ICD-10-CM | POA: Diagnosis not present

## 2013-03-20 DIAGNOSIS — N329 Bladder disorder, unspecified: Secondary | ICD-10-CM | POA: Diagnosis not present

## 2013-03-30 ENCOUNTER — Other Ambulatory Visit (HOSPITAL_COMMUNITY): Payer: Self-pay | Admitting: Psychiatry

## 2013-03-31 DIAGNOSIS — K219 Gastro-esophageal reflux disease without esophagitis: Secondary | ICD-10-CM | POA: Diagnosis not present

## 2013-03-31 DIAGNOSIS — R634 Abnormal weight loss: Secondary | ICD-10-CM | POA: Diagnosis not present

## 2013-03-31 DIAGNOSIS — N189 Chronic kidney disease, unspecified: Secondary | ICD-10-CM | POA: Diagnosis not present

## 2013-03-31 DIAGNOSIS — E785 Hyperlipidemia, unspecified: Secondary | ICD-10-CM | POA: Diagnosis not present

## 2013-04-03 DIAGNOSIS — R9439 Abnormal result of other cardiovascular function study: Secondary | ICD-10-CM | POA: Diagnosis not present

## 2013-04-03 DIAGNOSIS — Q246 Congenital heart block: Secondary | ICD-10-CM | POA: Diagnosis not present

## 2013-04-03 DIAGNOSIS — R0602 Shortness of breath: Secondary | ICD-10-CM | POA: Diagnosis not present

## 2013-04-03 DIAGNOSIS — I2510 Atherosclerotic heart disease of native coronary artery without angina pectoris: Secondary | ICD-10-CM | POA: Diagnosis not present

## 2013-04-08 ENCOUNTER — Ambulatory Visit (HOSPITAL_COMMUNITY): Payer: Self-pay | Admitting: Psychiatry

## 2013-04-08 DIAGNOSIS — N183 Chronic kidney disease, stage 3 unspecified: Secondary | ICD-10-CM | POA: Diagnosis not present

## 2013-04-08 DIAGNOSIS — N400 Enlarged prostate without lower urinary tract symptoms: Secondary | ICD-10-CM | POA: Diagnosis not present

## 2013-04-08 DIAGNOSIS — R358 Other polyuria: Secondary | ICD-10-CM | POA: Diagnosis not present

## 2013-04-08 DIAGNOSIS — R3589 Other polyuria: Secondary | ICD-10-CM | POA: Diagnosis not present

## 2013-04-08 DIAGNOSIS — R109 Unspecified abdominal pain: Secondary | ICD-10-CM | POA: Diagnosis not present

## 2013-04-08 DIAGNOSIS — R634 Abnormal weight loss: Secondary | ICD-10-CM | POA: Diagnosis not present

## 2013-04-08 DIAGNOSIS — R350 Frequency of micturition: Secondary | ICD-10-CM | POA: Diagnosis not present

## 2013-04-10 ENCOUNTER — Ambulatory Visit (INDEPENDENT_AMBULATORY_CARE_PROVIDER_SITE_OTHER): Payer: Medicare Other | Admitting: Psychiatry

## 2013-04-10 ENCOUNTER — Encounter (HOSPITAL_COMMUNITY): Payer: Self-pay | Admitting: Psychiatry

## 2013-04-10 VITALS — BP 98/71 | HR 78 | Ht 70.5 in | Wt 174.0 lb

## 2013-04-10 DIAGNOSIS — F319 Bipolar disorder, unspecified: Secondary | ICD-10-CM

## 2013-04-10 MED ORDER — LITHIUM CARBONATE 300 MG PO CAPS: ORAL_CAPSULE | ORAL | Status: DC

## 2013-04-10 MED ORDER — TRAZODONE HCL 100 MG PO TABS: 100.0000 mg | ORAL_TABLET | Freq: Every day | ORAL | Status: DC

## 2013-04-10 MED ORDER — FLUOXETINE HCL 10 MG PO CAPS: 10.0000 mg | ORAL_CAPSULE | Freq: Every day | ORAL | Status: DC

## 2013-04-10 NOTE — Progress Notes (Signed)
[place] [phone] Progress Note  Jeffrey Herring [id number] 62 y.o.  04/10/2013 4:01 PM  Chief Complaint:  I'm having cardiac catheterization on coming Tuesday.  I am nervous.    History of Present Illness: Patient is 62 year old Caucasian male who came for his followup appointment with his wife.  He is anxious about upcoming cardiac catheterization.  He was recently seen his cardiologist and recommended cardiac cath.  Patient admitted anxiety and nervousness and sleep issue.  He admitted also getting irritable and frustrated since lithium has been decreased.  We are still waiting for his blood work which was done at his primary care physician Dr. Jeannie Done at [facility].  His wife told that he may have a high creatinine.  Patient continued to have frequent urination in the night despite lowering the lithium.  Wife told that patient drinks too much water and liquid.  Despite some irritability and frustration patient denies any anger mood swing.  He endorse some time manic-like feeling.  We have discuss trying to make to but patient is reluctant to try any new medication at this time.  Patient likes lithium however we do not have recent BUN and creatinine.  I also believe that lithium may be causing diabetes insipidus.  Patient denies any paranoia hallucination or any active or passive suicidal thoughts.  He received records from his cardiologist.  I called his primary care physician for his and blood work..  Patient has no tremors or shakes.  Suicidal Ideation: No Plan Formed: No Patient has means to carry out plan: No  Homicidal Ideation: No Plan Formed: No Patient has means to carry out plan: No  Review of Systems: Psychiatric: Agitation: No Hallucination: No Depressed Mood: No Insomnia: Yes Hypersomnia: No Altered Concentration: No Feels Worthless: No Grandiose Ideas: No Belief In Special Powers: No New/Increased Substance Abuse: No Compulsions: No  Neurologic: Headache:  No Seizure: No Paresthesias: No  Medical History; Patient has history of arrhythmia, hyperlipidemia and blood clot.  He is on pacemaker.  He sees Dr. Jeannie Done at [facility] in [place].  He was recently diagnosed with BPH.  Family and Social History: Patient was born and raised in [place].  He move from [place] with his wife 10 years ago.  He wanted to live close to his children.  Outpatient Encounter Prescriptions as of 04/10/2013  Medication Sig Dispense Refill  . ALPRAZolam (XANAX) 1 MG tablet TAKE 1 TABLET THREE TIMES DAILY  90 tablet  0  . FLUoxetine (PROZAC) 10 MG capsule Take 1 capsule (10 mg total) by mouth daily.  30 capsule  2  . lithium carbonate 300 MG capsule Take 1 in am and hs  60 capsule  2  . omeprazole (PRILOSEC) 20 MG capsule Take 20 mg by mouth daily.      . simvastatin (ZOCOR) 10 MG tablet Take 10 mg by mouth at bedtime.      . tamsulosin (FLOMAX) 0.4 MG CAPS Take 0.4 mg by mouth.      . traZODone (DESYREL) 100 MG tablet Take 1 tablet (100 mg total) by mouth at bedtime.  30 tablet  2  . [DISCONTINUED] FLUoxetine (PROZAC) 10 MG capsule Take 1 capsule (10 mg total) by mouth daily.  30 capsule  1  . [DISCONTINUED] lithium carbonate 300 MG capsule Take 1 in am and hs  60 capsule  1  . [DISCONTINUED] traZODone (DESYREL) 100 MG tablet Take 1 tablet (100 mg total) by mouth at bedtime.  30 tablet  2  No facility-administered encounter medications on file as of 04/10/2013.    Past Psychiatric History/Hospitalization(s): Anxiety: Yes Bipolar Disorder: Yes Depression: Yes Mania: Yes Psychosis: Yes Schizophrenia: No Personality Disorder: No Hospitalization for psychiatric illness: Patient is been seeing in this office since 2007.  He was admitted at behavioral Center in 2006 due to psychotic episode.  Patient has a history of anger mood swing and poor impulse control.  In the past he has taken Zyprexa, Abilify, Risperdal, Lexapro and Wellbutrin.  He's stable on his  medication since 2010. History of Electroconvulsive Shock Therapy: No Prior Suicide Attempts: Yes  Physical Exam: Constitutional:  BP 98/71  Pulse 78  Ht 5' 10.5" (1.791 m)  Wt 174 lb (78.926 kg)  BMI 24.61 kg/m2  General Appearance: alert, oriented, no acute distress, well nourished and Casually dressed.  He maintained fair eye contact.  Musculoskeletal: Strength & Muscle Tone: within normal limits Gait & Station: normal Patient leans: N/A  Psychiatric: Speech (describe rate, volume, coherence, spontaneity, and abnormalities if any): Clear and coherent  Thought Process (describe rate, content, abstract reasoning, and computation): Goal-directed  Associations: Coherent, Relevant and Intact  Thoughts: normal  Mental Status: Orientation: oriented to person, place, time/date and situation Mood & Affect: Anxious.  His psychomotor activity is normal. Attention Span & Concentration: Fair  Medical Decision Making (Choose Three): Established Problem, Stable/Improving (1), Review of Psycho-Social Stressors (1), Decision to obtain old records (1), Review and summation of old records (2), New Problem, with no additional work-up planned (3), Review of Last Therapy Session (1) and Review of Medication Regimen & Side Effects (2)  Assessment: Axis I: Bipolar disorder NOS  Axis II: Deferred  Axis III: See medical history  Axis IV: Mild to moderate  Axis V: 60-65   Plan:  I review records from cardiologist, left message for his primary care physician to fax his recent blood work , discussed anxiety related to upcoming cardiac catheterization  And reassurance given.at this time I will not change any medication since patient is going for cardiac catheterization .  However we will consider switching to Lamictal in the future if patient continues to have issues with lithium are high BUN/creatinine.  Discuss with the patient and his wife about future treatment plan.  We'll followup her  blood results from his primary care physician.  I will see him again in 3 months.  Time spent 25 minutes.  More than 50% of the time spent in psychoeducation, counseling and coordination of care. ARFEEN,SYED T., MD 04/10/2013

## 2013-04-15 DIAGNOSIS — M129 Arthropathy, unspecified: Secondary | ICD-10-CM | POA: Diagnosis not present

## 2013-04-15 DIAGNOSIS — R9431 Abnormal electrocardiogram [ECG] [EKG]: Secondary | ICD-10-CM | POA: Diagnosis not present

## 2013-04-15 DIAGNOSIS — Z888 Allergy status to other drugs, medicaments and biological substances status: Secondary | ICD-10-CM | POA: Diagnosis not present

## 2013-04-15 DIAGNOSIS — Z01818 Encounter for other preprocedural examination: Secondary | ICD-10-CM | POA: Diagnosis not present

## 2013-04-15 DIAGNOSIS — Z885 Allergy status to narcotic agent status: Secondary | ICD-10-CM | POA: Diagnosis not present

## 2013-04-15 DIAGNOSIS — K219 Gastro-esophageal reflux disease without esophagitis: Secondary | ICD-10-CM | POA: Diagnosis not present

## 2013-04-15 DIAGNOSIS — I2510 Atherosclerotic heart disease of native coronary artery without angina pectoris: Secondary | ICD-10-CM | POA: Diagnosis not present

## 2013-04-15 DIAGNOSIS — F319 Bipolar disorder, unspecified: Secondary | ICD-10-CM | POA: Diagnosis not present

## 2013-04-15 DIAGNOSIS — R918 Other nonspecific abnormal finding of lung field: Secondary | ICD-10-CM | POA: Diagnosis not present

## 2013-04-15 DIAGNOSIS — Q246 Congenital heart block: Secondary | ICD-10-CM | POA: Diagnosis not present

## 2013-04-15 DIAGNOSIS — R9439 Abnormal result of other cardiovascular function study: Secondary | ICD-10-CM | POA: Diagnosis not present

## 2013-04-15 DIAGNOSIS — Z950 Presence of cardiac pacemaker: Secondary | ICD-10-CM | POA: Diagnosis not present

## 2013-05-06 DIAGNOSIS — R358 Other polyuria: Secondary | ICD-10-CM | POA: Diagnosis not present

## 2013-05-06 DIAGNOSIS — N281 Cyst of kidney, acquired: Secondary | ICD-10-CM | POA: Insufficient documentation

## 2013-05-06 DIAGNOSIS — Q619 Cystic kidney disease, unspecified: Secondary | ICD-10-CM | POA: Diagnosis not present

## 2013-05-06 DIAGNOSIS — N400 Enlarged prostate without lower urinary tract symptoms: Secondary | ICD-10-CM | POA: Diagnosis not present

## 2013-05-06 DIAGNOSIS — R350 Frequency of micturition: Secondary | ICD-10-CM | POA: Diagnosis not present

## 2013-05-06 DIAGNOSIS — R3589 Other polyuria: Secondary | ICD-10-CM | POA: Diagnosis not present

## 2013-05-12 DIAGNOSIS — I2510 Atherosclerotic heart disease of native coronary artery without angina pectoris: Secondary | ICD-10-CM | POA: Diagnosis not present

## 2013-05-12 DIAGNOSIS — N289 Disorder of kidney and ureter, unspecified: Secondary | ICD-10-CM | POA: Diagnosis not present

## 2013-05-15 DIAGNOSIS — D649 Anemia, unspecified: Secondary | ICD-10-CM | POA: Diagnosis not present

## 2013-05-19 DIAGNOSIS — Z885 Allergy status to narcotic agent status: Secondary | ICD-10-CM | POA: Diagnosis not present

## 2013-05-19 DIAGNOSIS — Q246 Congenital heart block: Secondary | ICD-10-CM | POA: Diagnosis not present

## 2013-05-19 DIAGNOSIS — R9439 Abnormal result of other cardiovascular function study: Secondary | ICD-10-CM | POA: Diagnosis not present

## 2013-05-19 DIAGNOSIS — Z950 Presence of cardiac pacemaker: Secondary | ICD-10-CM | POA: Diagnosis not present

## 2013-05-19 DIAGNOSIS — Z79899 Other long term (current) drug therapy: Secondary | ICD-10-CM | POA: Diagnosis not present

## 2013-05-19 DIAGNOSIS — I2510 Atherosclerotic heart disease of native coronary artery without angina pectoris: Secondary | ICD-10-CM | POA: Diagnosis not present

## 2013-05-19 DIAGNOSIS — Z886 Allergy status to analgesic agent status: Secondary | ICD-10-CM | POA: Diagnosis not present

## 2013-05-31 ENCOUNTER — Other Ambulatory Visit (HOSPITAL_COMMUNITY): Payer: Self-pay | Admitting: Psychiatry

## 2013-05-31 DIAGNOSIS — F319 Bipolar disorder, unspecified: Secondary | ICD-10-CM

## 2013-06-12 DIAGNOSIS — R3589 Other polyuria: Secondary | ICD-10-CM | POA: Diagnosis not present

## 2013-06-12 DIAGNOSIS — N183 Chronic kidney disease, stage 3 unspecified: Secondary | ICD-10-CM | POA: Diagnosis not present

## 2013-06-12 DIAGNOSIS — R358 Other polyuria: Secondary | ICD-10-CM | POA: Diagnosis not present

## 2013-06-12 DIAGNOSIS — R350 Frequency of micturition: Secondary | ICD-10-CM | POA: Diagnosis not present

## 2013-06-12 DIAGNOSIS — N400 Enlarged prostate without lower urinary tract symptoms: Secondary | ICD-10-CM | POA: Diagnosis not present

## 2013-07-11 ENCOUNTER — Encounter (HOSPITAL_COMMUNITY): Payer: Self-pay | Admitting: Psychiatry

## 2013-07-11 ENCOUNTER — Ambulatory Visit (INDEPENDENT_AMBULATORY_CARE_PROVIDER_SITE_OTHER): Payer: Medicare Other | Admitting: Psychiatry

## 2013-07-11 VITALS — BP 109/65 | HR 63 | Wt 171.0 lb

## 2013-07-11 DIAGNOSIS — F319 Bipolar disorder, unspecified: Secondary | ICD-10-CM | POA: Diagnosis not present

## 2013-07-11 MED ORDER — LAMOTRIGINE 25 MG PO TABS: ORAL_TABLET | ORAL | Status: DC

## 2013-07-11 MED ORDER — FLUOXETINE HCL 10 MG PO CAPS: 10.0000 mg | ORAL_CAPSULE | Freq: Every day | ORAL | Status: DC

## 2013-07-11 MED ORDER — TRAZODONE HCL 100 MG PO TABS: 100.0000 mg | ORAL_TABLET | Freq: Every day | ORAL | Status: DC

## 2013-07-11 MED ORDER — ALPRAZOLAM 1 MG PO TABS: ORAL_TABLET | ORAL | Status: DC

## 2013-07-11 NOTE — Progress Notes (Signed)
[facility] [phone] Progress Note  Jeffrey Herring [id number] 62 y.o.  07/11/2013 11:36 AM  Chief Complaint:  I medication management and followup.      History of Present Illness: Patient is 62 year old Caucasian male who came for his followup appointment with his wife.  Patient was recently seen to his nephrologist Dr. Johnney Herring on August 28.  Patient has history of kidney stone and recently he has a lot of urinary symptoms.  He's been seen this doctor for a long time .  Patient has chronic kidney disease .  His last creatinine was 2.25 , BUN 23 on July 14.  He's been compliant with his psychotropic medication however recently there are new medicine added by his primary care physician and cardiologist.  Patient is allergic to aspirin , he is taking Plavix and also recommended lisinopril however patient has not started yet.  Patient has cardiac catheterization and he was very pleased that his arteries are not blocked that requires any stent .  Patient still has multiple medical problems and sometime he feels very tired and lack of energy.  Recently his primary care physician added iron because he was found to be anemic .  We are reducing his lithium gradually because of high creatinine .  However patient and his wife is very concerned because lithium has worked with him very well for his bipolar disorder.  He is taking now lithium 300 mg twice a day.  Patient has not seen regarding symptoms of bipolar disorder .  He has some anxiety and irritability and sometimes he does not sleep very well.  However he has frequent urination and polyuria which causes insomnia .  This time patient and his wife agree to try a new medication.  He has never tried Lamictal in the past.  Patient denies any hallucinations, paranoia or any suicidal thoughts.  Suicidal Ideation: No Plan Formed: No Patient has means to carry out plan: No  Homicidal Ideation: No Plan Formed: No Patient has means to carry out plan:  No  Review of Systems: Psychiatric: Agitation: No Hallucination: No Depressed Mood: No Insomnia: Yes Hypersomnia: No Altered Concentration: No Feels Worthless: No Grandiose Ideas: No Belief In Special Powers: No New/Increased Substance Abuse: No Compulsions: No  Neurologic: Headache: No Seizure: No Paresthesias: No  Medical History; Patient has history of arrhythmia, history of pulmonary embolism and DVT, history of nephrolithiasis , benign prostate hypertrophy, history of chronic kidney disease , anemia and hyperlipidemia.  He has a pacemaker .  His last blood work which was done on July 14 shows BUN 23, creatinine 2.25, glucose 107, calcium 10, GFR 30, sodium 139, potassium 4.2, chloride 104 and bicarbonate 20.  On June 24 2 CBC shows WBC count 7.4, hemoglobin and hematocrit 12.2 and 36.9, platelets 183, absolute neutrophil count 5.2 and ESR 24.  His primary care physician is Dr. Rutherford Herring and his nephrologist is Dr. Johnney Herring.    Family and Social History: Patient was born and raised in [place].  He move from [place] with his wife 10 years ago.  He wanted to live close to his children.  Outpatient Encounter Prescriptions as of 07/11/2013  Medication Sig Dispense Refill  . ALPRAZolam (XANAX) 1 MG tablet TAKE 1 TABLET THREE TIMES DAILY  90 tablet  0  . clopidogrel (PLAVIX) 75 MG tablet Take 75 mg by mouth daily.      Marland Kitchen FLUoxetine (PROZAC) 10 MG capsule Take 1 capsule (10 mg total) by mouth daily.  [place]  capsule  2  . lithium carbonate 300 MG capsule Take 1 in am and hs  60 capsule  2  . metoprolol succinate (TOPROL-XL) 25 MG 24 hr tablet Take 25 mg by mouth daily. 1/2 tab daily      . omeprazole (PRILOSEC) 20 MG capsule Take 20 mg by mouth daily.      . simvastatin (ZOCOR) 10 MG tablet Take 10 mg by mouth at bedtime.      . tamsulosin (FLOMAX) 0.4 MG CAPS Take 0.4 mg by mouth.      . traZODone (DESYREL) 100 MG tablet Take 1 tablet (100 mg total) by mouth at bedtime.  30 tablet  0  .  [DISCONTINUED] ALPRAZolam (XANAX) 1 MG tablet TAKE 1 TABLET THREE TIMES DAILY  90 tablet  0  . [DISCONTINUED] FLUoxetine (PROZAC) 10 MG capsule Take 1 capsule (10 mg total) by mouth daily.  30 capsule  2  . [DISCONTINUED] traZODone (DESYREL) 100 MG tablet Take 1 tablet (100 mg total) by mouth at bedtime.  30 tablet  2  . lamoTRIgine (LAMICTAL) 25 MG tablet Take 1 tab daily for 1 week and than 2 tab daily  60 tablet  0  . [DISCONTINUED] lamoTRIgine (LAMICTAL) 25 MG tablet Take 1 tab daily for 1 week and than 2 tab daily  60 tablet  0   No facility-administered encounter medications on file as of 07/11/2013.    Past Psychiatric History/Hospitalization(s): Anxiety: Yes Bipolar Disorder: Yes Depression: Yes Mania: Yes Psychosis: Yes Schizophrenia: No Personality Disorder: No Hospitalization for psychiatric illness: Patient is been seeing in this office since 2007.  He was admitted at behavioral Center in 2006 due to psychotic episode.  Patient has a history of anger mood swing and poor impulse control.  In the past he has taken Zyprexa, Abilify, Risperdal, Lexapro and Wellbutrin.  He's stable on his medication since 2010. History of Electroconvulsive Shock Therapy: No Prior Suicide Attempts: Yes  Physical Exam: Constitutional:  BP 109/65  Pulse 63  Wt 171 lb (77.565 kg)  BMI 24.18 kg/m2  General Appearance: alert, oriented, no acute distress, well nourished and Casually dressed.  He maintained fair eye contact.  Musculoskeletal: Strength & Muscle Tone: within normal limits Gait & Station: normal Patient leans: N/A  Psychiatric: Speech (describe rate, volume, coherence, spontaneity, and abnormalities if any): Clear and coherent  Thought Process (describe rate, content, abstract reasoning, and computation): Goal-directed  Associations: Coherent, Relevant and Intact  Thoughts: normal  Mental Status: Orientation: oriented to person, place, time/date and situation Mood & Affect:  Anxious.  His psychomotor activity is normal. Attention Span & Concentration: Fair  Medical Decision Making (Choose Three): Established Problem, Stable/Improving (1), New problem, with additional work up planned, Review of Psycho-Social Stressors (1), Decision to obtain old records (1), Review and summation of old records (2), New Problem, with no additional work-up planned (3), Review of Last Therapy Session (1), Review of Medication Regimen & Side Effects (2) and Review of New Medication or Change in Dosage (2)  Assessment: Axis I: Bipolar disorder NOS  Axis II: Deferred  Axis III: See medical history  Axis IV: Mild to moderate  Axis V: 60-65   Plan:  I review records from cardiologist, nephrologist and recent blood work.  Patient and his wife agree to try a new medication.  I will start Lamictal 25 mg daily for one week and gradually increase to 50 mg daily.  I recommend to decrease lithium 300 mg daily for another 2  weeks and then stop.  I explained the risks and benefits of medication especially a rash related to Lamictal and in that case patient needs to stop the medication immediately.  Recommended to call us back if he has any question or any concern.  I will continue Xanax, Prozac and trazodone at present dose.  Followup in 3 weeks. Time spent 25 minutes.  More than 50% of the time spent in psychoeducation, counseling and coordination of care. Jeffrey Herring T., MD 07/11/2013

## 2013-07-16 DIAGNOSIS — R0602 Shortness of breath: Secondary | ICD-10-CM | POA: Diagnosis not present

## 2013-08-01 ENCOUNTER — Encounter (HOSPITAL_COMMUNITY): Payer: Self-pay | Admitting: Psychiatry

## 2013-08-01 ENCOUNTER — Ambulatory Visit (INDEPENDENT_AMBULATORY_CARE_PROVIDER_SITE_OTHER): Payer: Medicare Other | Admitting: Psychiatry

## 2013-08-01 ENCOUNTER — Other Ambulatory Visit (HOSPITAL_COMMUNITY): Payer: Self-pay | Admitting: Psychiatry

## 2013-08-01 VITALS — BP 105/55 | HR 63 | Wt 175.0 lb

## 2013-08-01 DIAGNOSIS — F319 Bipolar disorder, unspecified: Secondary | ICD-10-CM | POA: Diagnosis not present

## 2013-08-01 MED ORDER — ALPRAZOLAM 1 MG PO TABS: ORAL_TABLET | ORAL | Status: DC

## 2013-08-01 MED ORDER — FLUOXETINE HCL 10 MG PO CAPS: 10.0000 mg | ORAL_CAPSULE | Freq: Every day | ORAL | Status: DC

## 2013-08-01 MED ORDER — TRAZODONE HCL 100 MG PO TABS: 100.0000 mg | ORAL_TABLET | Freq: Every day | ORAL | Status: DC

## 2013-08-01 NOTE — Progress Notes (Signed)
[facility] [phone] Progress Note  Joslyn Coniglio [id number] 62 y.o.  08/01/2013 12:10 PM  Chief Complaint:  I am having blisters in my tongue.       History of Present Illness: Patient is 62 year old Caucasian male who came for his followup appointment with his wife.  On his last visit we started him on Lamictal and discontinue lithium because his crack and was very high.  Patient and his wife endorsed that he had some time blisters inside his tongue.  Patient is not sure if started soon after Lamictal.  Wife endorsed that he has it from the past but he never mentioned.  He is taking iron pill.  He is anemia and he is taking iron pills.  The patient has not seen any improvement in his lethargy and remains feeling tired.  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  He admitted difficulty eating the food and he has lost weight from the past.  For now recommend to stop Lamictal.  Patient is no longer taking lithium.  We will defer any mood stabilizer or an antipsychotic medication at this time.  Patient denies any psychosis however he has history of bipolar disorder.  We will closely monitor the symptoms .  I will continue Xanax trazodone and Prozac at this time.  Patient will consult his primary care physician for the lesion on his tongue .  Patient denies any hallucinations, paranoia or any suicidal thoughts.  Suicidal Ideation: No Plan Formed: No Patient has means to carry out plan: No  Homicidal Ideation: No Plan Formed: No Patient has means to carry out plan: No  Review of Systems: Psychiatric: Agitation: No Hallucination: No Depressed Mood: No Insomnia: Yes Hypersomnia: No Altered Concentration: No Feels Worthless: No Grandiose Ideas: No Belief In Special Powers: No New/Increased Substance Abuse: No Compulsions: No  Neurologic: Headache: No Seizure: No Paresthesias: No  Medical  History; Patient has history of arrhythmia, history of pulmonary embolism and DVT, history of nephrolithiasis , benign prostate hypertrophy, history of chronic kidney disease , anemia and hyperlipidemia.  He has a pacemaker .  His last blood work which was done on July 14 shows BUN 23, creatinine 2.25, glucose 107, calcium 10, GFR 30, sodium 139, potassium 4.2, chloride 104 and bicarbonate 20.  On June 24 2 CBC shows WBC count 7.4, hemoglobin and hematocrit 12.2 and 36.9, platelets 183, absolute neutrophil count 5.2 and ESR 24.  His primary care physician is Dr. Rutherford Nail and his nephrologist is Dr. Johnney Ou.    Family and Social History: Patient was born and raised in [place].  He move from [place] with his wife 10 years ago.  He wanted to live close to his children.  Outpatient Encounter Prescriptions as of 08/01/2013  Medication Sig Dispense Refill  . ALPRAZolam (XANAX) 1 MG tablet TAKE 1 TABLET THREE TIMES DAILY  90 tablet  1  . clopidogrel (PLAVIX) 75 MG tablet Take 75 mg by mouth daily.      Marland Kitchen FLUoxetine (PROZAC) 10 MG capsule Take 1 capsule (10 mg total) by mouth daily.  30 capsule  1  . metoprolol succinate (TOPROL-XL) 25 MG 24 hr tablet Take 25 mg by mouth daily. 1/2 tab daily      . omeprazole (PRILOSEC) 20 MG capsule Take 20 mg by mouth daily.      . simvastatin (ZOCOR) 10 MG tablet Take 10 mg by mouth at bedtime.      Marland Kitchen  tamsulosin (FLOMAX) 0.4 MG CAPS Take 0.4 mg by mouth.      . traZODone (DESYREL) 100 MG tablet Take 1 tablet (100 mg total) by mouth at bedtime.  30 tablet  1  . [DISCONTINUED] ALPRAZolam (XANAX) 1 MG tablet TAKE 1 TABLET THREE TIMES DAILY  90 tablet  0  . [DISCONTINUED] FLUoxetine (PROZAC) 10 MG capsule Take 1 capsule (10 mg total) by mouth daily.  30 capsule  2  . [DISCONTINUED] lamoTRIgine (LAMICTAL) 25 MG tablet Take 1 tab daily for 1 week and than 2 tab daily  60 tablet  0  . [DISCONTINUED] lithium carbonate 300 MG capsule Take 1 in am and hs  60 capsule  2  .  [DISCONTINUED] traZODone (DESYREL) 100 MG tablet Take 1 tablet (100 mg total) by mouth at bedtime.  30 tablet  0   No facility-administered encounter medications on file as of 08/01/2013.    Past Psychiatric History/Hospitalization(s): Anxiety: Yes Bipolar Disorder: Yes Depression: Yes Mania: Yes Psychosis: Yes Schizophrenia: No Personality Disorder: No Hospitalization for psychiatric illness: Patient is been seeing in this office since 2007.  He was admitted at behavioral Center in 2006 due to psychotic episode.  Patient has a history of anger mood swing and poor impulse control.  In the past he has taken Zyprexa, Abilify, Risperdal, Lexapro and Wellbutrin.  He's stable on his medication since 2010. History of Electroconvulsive Shock Therapy: No Prior Suicide Attempts: Yes  Physical Exam: Constitutional:  BP 105/55  Pulse 63  Wt 175 lb (79.379 kg)  BMI 24.75 kg/m2  General Appearance: alert, oriented, no acute distress, well nourished and Casually dressed.  He maintained fair eye contact.  Musculoskeletal: Strength & Muscle Tone: within normal limits Gait & Station: normal Patient leans: N/A  Psychiatric: Speech (describe rate, volume, coherence, spontaneity, and abnormalities if any): Clear and coherent  Thought Process (describe rate, content, abstract reasoning, and computation): Goal-directed  Associations: Coherent, Relevant and Intact  Thoughts: normal  Mental Status: Orientation: oriented to person, place, time/date and situation Mood & Affect: Anxious.  His psychomotor activity is normal. Attention Span & Concentration: Fair  Medical Decision Making (Choose Three): Established Problem, Stable/Improving (1), New problem, with additional work up planned, Review of Psycho-Social Stressors (1), Review of Last Therapy Session (1), Review of Medication Regimen & Side Effects (2) and Review of New Medication or Change in Dosage (2)  Assessment: Axis I: Bipolar  disorder NOS  Axis II: Deferred  Axis III: See medical history  Axis IV: Mild to moderate  Axis V: 60-65   Plan:  Discontinue Lamictal.  Recommend patient to see his primary care physician Dr. Rutherford Nail for lesion inside his tongue.  Continue Prozac, Xanax and trazodone.  He is sleeping better from the past.  Discussed the safety plan , recommend to call us back if he started to feel symptoms getting worse .  Followup in 6 weeks.  Time spent 25 minutes.  More than 50% of the time spent in psychoeducation, counseling and coordination of care. Mekenna Finau T., MD 08/01/2013

## 2013-09-01 DIAGNOSIS — N183 Chronic kidney disease, stage 3 unspecified: Secondary | ICD-10-CM | POA: Diagnosis not present

## 2013-09-01 DIAGNOSIS — Z23 Encounter for immunization: Secondary | ICD-10-CM | POA: Diagnosis not present

## 2013-09-03 ENCOUNTER — Telehealth (HOSPITAL_COMMUNITY): Payer: Self-pay

## 2013-09-03 MED ORDER — QUETIAPINE FUMARATE 100 MG PO TABS: ORAL_TABLET | ORAL | Status: DC

## 2013-09-03 NOTE — Telephone Encounter (Signed)
I returned patient's phone call.  Patient is complaining of increased agitation and anger.  He is not taking lithium because of high creatinine.  We started him on Lamictal but he developed blisters on his tongue.  Patient endorsed poor sleep for past 2 days, irritability anger and mood swings.  He is taking Valium which is given by his physician.  I recommend to discontinue Valium suspicion is already taking Xanax.  Recommend to try Seroquel 100 mg half to one tablet at bedtime.  Recommend to cause back if he does not feel any improvement.  I will call Seroquel to his local pharmacy at [facility].

## 2013-09-08 DIAGNOSIS — R350 Frequency of micturition: Secondary | ICD-10-CM | POA: Diagnosis not present

## 2013-09-08 DIAGNOSIS — N400 Enlarged prostate without lower urinary tract symptoms: Secondary | ICD-10-CM | POA: Diagnosis not present

## 2013-09-08 DIAGNOSIS — N183 Chronic kidney disease, stage 3 unspecified: Secondary | ICD-10-CM | POA: Diagnosis not present

## 2013-09-19 ENCOUNTER — Ambulatory Visit (INDEPENDENT_AMBULATORY_CARE_PROVIDER_SITE_OTHER): Payer: Medicare Other | Admitting: Psychiatry

## 2013-09-19 ENCOUNTER — Encounter (HOSPITAL_COMMUNITY): Payer: Self-pay | Admitting: Psychiatry

## 2013-09-19 DIAGNOSIS — F319 Bipolar disorder, unspecified: Secondary | ICD-10-CM

## 2013-09-19 MED ORDER — TRAZODONE HCL 100 MG PO TABS: 100.0000 mg | ORAL_TABLET | Freq: Every day | ORAL | Status: DC

## 2013-09-19 MED ORDER — FLUOXETINE HCL 10 MG PO CAPS: 10.0000 mg | ORAL_CAPSULE | Freq: Every day | ORAL | Status: DC

## 2013-09-19 MED ORDER — ALPRAZOLAM 1 MG PO TABS: ORAL_TABLET | ORAL | Status: DC

## 2013-09-19 NOTE — Progress Notes (Signed)
[facility] [phone] Progress Note  Jeffrey Herring [id number] 62 y.o.  09/19/2013 11:47 AM  Chief Complaint:  Taking lithium 300 mg twice a day.  I cannot take Seroquel it is making me groggy and sleepy.       History of Present Illness: Jeffrey Herring came for his followup appointment with his wife.  He is taking lithium 300 mg twice a day.  We had tried Lamictal and Seroquel however patient developed a rash with Lamictal and cannot tolerate the Seroquel.  He recently saw his nephrologist Dr. Johnney Ou for blood work.  His creatinine is 2.20 and his nephrologist has no issues continuing lithium.  Patient and his wife also does not want to change any medication at this time since he is doing much better with the lithium.  He is taking Prozac trazodone and Xanax as prescribed.  He is not interested to try any other medication.  He is sleeping better.  He denies any irritability and/or any severe mood swing.  He denies any hallucinations, paranoia or any suicidal thoughts.  He is looking forward to Christmas.  Suicidal Ideation: No Plan Formed: No Patient has means to carry out plan: No  Homicidal Ideation: No Plan Formed: No Patient has means to carry out plan: No  Review of Systems: Psychiatric: Agitation: No Hallucination: No Depressed Mood: No Insomnia: Yes Hypersomnia: No Altered Concentration: No Feels Worthless: No Grandiose Ideas: No Belief In Special Powers: No New/Increased Substance Abuse: No Compulsions: No  Neurologic: Headache: No Seizure: No Paresthesias: No  Medical History; Patient has history of arrhythmia, history of pulmonary embolism and DVT, history of nephrolithiasis , benign prostate hypertrophy, history of chronic kidney disease , anemia and hyperlipidemia.  He has a pacemaker .  His last blood work which was done on July 14 shows BUN 23, creatinine 2.25, glucose 107, calcium 10, GFR 30, sodium 139, potassium 4.2, chloride 104 and bicarbonate 20.  On June 24  2 CBC shows WBC count 7.4, hemoglobin and hematocrit 12.2 and 36.9, platelets 183, absolute neutrophil count 5.2 and ESR 24.  His primary care physician is Dr. Rutherford Nail and his nephrologist is Dr. Johnney Ou.  His last creatinine was 2.2 which was done 09/02/2013.  Family and Social History: Patient was born and raised in [place].  He move from [place] with his wife 10 years ago.  He wanted to live close to his children.  Outpatient Encounter Prescriptions as of 09/19/2013  Medication Sig  . ALPRAZolam (XANAX) 1 MG tablet TAKE 1 TABLET THREE TIMES DAILY  . clopidogrel (PLAVIX) 75 MG tablet Take 75 mg by mouth daily.  Marland Kitchen FLUoxetine (PROZAC) 10 MG capsule Take 1 capsule (10 mg total) by mouth daily.  Marland Kitchen lithium carbonate 300 MG capsule Take 300 mg by mouth 2 (two) times daily with a meal.  . metoprolol succinate (TOPROL-XL) 25 MG 24 hr tablet Take 25 mg by mouth daily. 1/2 tab daily  . omeprazole (PRILOSEC) 20 MG capsule Take 20 mg by mouth daily.  . simvastatin (ZOCOR) 10 MG tablet Take 10 mg by mouth at bedtime.  . tamsulosin (FLOMAX) 0.4 MG CAPS Take 0.4 mg by mouth.  . traZODone (DESYREL) 100 MG tablet Take 1 tablet (100 mg total) by mouth at bedtime.  . [DISCONTINUED] ALPRAZolam (XANAX) 1 MG tablet TAKE 1 TABLET THREE TIMES DAILY  . [DISCONTINUED] FLUoxetine (PROZAC) 10 MG capsule Take 1 capsule (10 mg total) by mouth daily.  . [DISCONTINUED] traZODone (DESYREL) 100 MG tablet Take 1  tablet (100 mg total) by mouth at bedtime.  . [DISCONTINUED] QUEtiapine (SEROQUEL) 100 MG tablet Take 1/2 to 1 tab at bed time    Past Psychiatric History/Hospitalization(s): Anxiety: Yes Bipolar Disorder: Yes Depression: Yes Mania: Yes Psychosis: Yes Schizophrenia: No Personality Disorder: No Hospitalization for psychiatric illness: Patient is been seeing in this office since 2007.  He was admitted at behavioral Center in 2006 due to psychotic episode.  Patient has a history of anger mood swing and poor  impulse control.  In the past he has taken Zyprexa, Abilify, Risperdal, Lexapro and Wellbutrin.  He's stable on his medication since 2010. History of Electroconvulsive Shock Therapy: No Prior Suicide Attempts: Yes  Physical Exam: Constitutional:  There were no vitals taken for this visit.  General Appearance: alert, oriented, no acute distress, well nourished and Casually dressed.  He maintained fair eye contact.  Musculoskeletal: Strength & Muscle Tone: within normal limits Gait & Station: normal Patient leans: N/A  Psychiatric: Speech (describe rate, volume, coherence, spontaneity, and abnormalities if any): Clear and coherent  Thought Process (describe rate, content, abstract reasoning, and computation): Goal-directed  Associations: Coherent, Relevant and Intact  Thoughts: normal  Mental Status: Orientation: oriented to person, place, time/date and situation Mood & Affect: Anxious.  His psychomotor activity is normal. Attention Span & Concentration: Fair  Medical Decision Making (Choose Three): Established Problem, Stable/Improving (1), Review or order clinical lab tests (1), Review of Last Therapy Session (1) and Review of New Medication or Change in Dosage (2)  Assessment: Axis I: Bipolar disorder NOS  Axis II: Deferred  Axis III: See medical history  Axis IV: Mild to moderate  Axis V: 60-65   Plan:  Discontinue the Seroquel.  Restart lithium 300 mg twice a day.  Continue Prozac, Xanax and trazodone.  Patient has remaining on lithium.  He required a new prescription for Xanax, Prozac and trazodone.  Followup in 2 months.  Simpson Paulos T., MD 09/19/2013

## 2013-10-14 DIAGNOSIS — I442 Atrioventricular block, complete: Secondary | ICD-10-CM | POA: Diagnosis not present

## 2013-10-14 DIAGNOSIS — Z45018 Encounter for adjustment and management of other part of cardiac pacemaker: Secondary | ICD-10-CM | POA: Diagnosis not present

## 2013-10-14 DIAGNOSIS — I38 Endocarditis, valve unspecified: Secondary | ICD-10-CM | POA: Insufficient documentation

## 2013-10-14 DIAGNOSIS — I495 Sick sinus syndrome: Secondary | ICD-10-CM | POA: Insufficient documentation

## 2013-10-14 DIAGNOSIS — I10 Essential (primary) hypertension: Secondary | ICD-10-CM | POA: Diagnosis not present

## 2013-10-14 DIAGNOSIS — Z950 Presence of cardiac pacemaker: Secondary | ICD-10-CM | POA: Diagnosis not present

## 2013-10-14 DIAGNOSIS — I4430 Unspecified atrioventricular block: Secondary | ICD-10-CM | POA: Diagnosis not present

## 2013-11-21 ENCOUNTER — Encounter (HOSPITAL_COMMUNITY): Payer: Self-pay | Admitting: Psychiatry

## 2013-11-21 ENCOUNTER — Ambulatory Visit (INDEPENDENT_AMBULATORY_CARE_PROVIDER_SITE_OTHER): Payer: Medicare Other | Admitting: Psychiatry

## 2013-11-21 DIAGNOSIS — F319 Bipolar disorder, unspecified: Secondary | ICD-10-CM

## 2013-11-21 MED ORDER — FLUOXETINE HCL 10 MG PO CAPS: 10.0000 mg | ORAL_CAPSULE | Freq: Every day | ORAL | Status: DC

## 2013-11-21 MED ORDER — ALPRAZOLAM 1 MG PO TABS: ORAL_TABLET | ORAL | Status: DC

## 2013-11-21 MED ORDER — TRAZODONE HCL 100 MG PO TABS: 100.0000 mg | ORAL_TABLET | Freq: Every day | ORAL | Status: DC

## 2013-11-21 MED ORDER — LITHIUM CARBONATE 300 MG PO CAPS: 300.0000 mg | ORAL_CAPSULE | Freq: Two times a day (BID) | ORAL | Status: DC

## 2013-11-21 NOTE — Progress Notes (Signed)
Jeffrey Herring [phone] Progress Note  Jeffrey Herring [id number] 63 y.o.  11/21/2013 11:11 AM  Chief Complaint:  Medication management and followup.         History of Present Illness: Jeffrey Herring came for his followup appointment by himself.  Usually he comes with his wife.  Patient told his wife has been busy and seen doctors for her own physical health issues.  Patient is doing better on lithium 300 mg twice a day along with Xanax, Prozac and trazodone.  He is been complaining of right abdominal pain and believes he has inguinal hernia .  He has difficulty walking because of pain but his mood has been stable.  Denies irritability, anger and mood swing.  He likes to Lamictal.  He wants to continue the same dosage .  Despite high creatinine and he has discussed with his wife about the possibility of side effects of lithium on the kidney patient insist taking lithium.  We had tried other medications but patient does not response and he had side effects.   He is sleeping better.  He denied any paranoia or any hallucination. He does not have any tremors, shakes or any other side effects.  Suicidal Ideation: No Plan Formed: No Patient has means to carry out plan: No  Homicidal Ideation: No Plan Formed: No Patient has means to carry out plan: No  Review of Systems: Psychiatric: Agitation: No Hallucination: No Depressed Mood: No Insomnia: Yes Hypersomnia: No Altered Concentration: No Feels Worthless: No Grandiose Ideas: No Belief In Special Powers: No New/Increased Substance Abuse: No Compulsions: No  Neurologic: Headache: No Seizure: No Paresthesias: No  Medical History; Patient has history of arrhythmia, history of pulmonary embolism and DVT, history of nephrolithiasis , benign prostate hypertrophy, history of chronic kidney disease , anemia and hyperlipidemia.  He has a pacemaker .  His last blood work which was done on July 14 shows BUN 23, creatinine 2.25, glucose 107, calcium  10, GFR 30, sodium 139, potassium 4.2, chloride 104 and bicarbonate 20.  On June 24 2 CBC shows WBC count 7.4, hemoglobin and hematocrit 12.2 and 36.9, platelets 183, absolute neutrophil count 5.2 and ESR 24.  His primary care physician is Dr. Rutherford Nail and his nephrologist is Dr. Johnney Ou.  His last creatinine was 2.2 which was done 09/02/2013.  Family and Social History:  Patient was born and raised in [place].  He move from [place] with his wife 10 years ago.  He wanted to live close to his children.  Outpatient Encounter Prescriptions as of 11/21/2013  Medication Sig  . ALPRAZolam (XANAX) 1 MG tablet TAKE 1 TABLET THREE TIMES DAILY  . FLUoxetine (PROZAC) 10 MG capsule Take 1 capsule (10 mg total) by mouth daily.  Marland Kitchen lithium carbonate 300 MG capsule Take 1 capsule (300 mg total) by mouth 2 (two) times daily with a meal.  . traZODone (DESYREL) 100 MG tablet Take 1 tablet (100 mg total) by mouth at bedtime.  . [DISCONTINUED] ALPRAZolam (XANAX) 1 MG tablet TAKE 1 TABLET THREE TIMES DAILY  . [DISCONTINUED] FLUoxetine (PROZAC) 10 MG capsule Take 1 capsule (10 mg total) by mouth daily.  . [DISCONTINUED] lithium carbonate 300 MG capsule Take 300 mg by mouth 2 (two) times daily with a meal.  . [DISCONTINUED] traZODone (DESYREL) 100 MG tablet Take 1 tablet (100 mg total) by mouth at bedtime.  . clopidogrel (PLAVIX) 75 MG tablet Take 75 mg by mouth daily.  . metoprolol succinate (TOPROL-XL) 25 MG 24 hr  tablet Take 25 mg by mouth daily. 1/2 tab daily  . omeprazole (PRILOSEC) 20 MG capsule Take 20 mg by mouth daily.  . simvastatin (ZOCOR) 10 MG tablet Take 10 mg by mouth at bedtime.  . tamsulosin (FLOMAX) 0.4 MG CAPS Take 0.4 mg by mouth.    Past Psychiatric History/Hospitalization(s): Anxiety: Yes Bipolar Disorder: Yes Depression: Yes Mania: Yes Psychosis: Yes Schizophrenia: No Personality Disorder: No Hospitalization for psychiatric illness: Patient is been seeing in this office since 2007.  He  was admitted at behavioral Center in 2006 due to psychotic episode.  Patient has a history of anger mood swing and poor impulse control.  In the past he has taken Zyprexa, Abilify, Risperdal, Lexapro and Wellbutrin.  He's stable on his medication since 2010. History of Electroconvulsive Shock Therapy: No Prior Suicide Attempts: Yes  Physical Exam: Constitutional:  There were no vitals taken for this visit.  General Appearance: alert, oriented, no acute distress, well nourished and Casually dressed.  He maintained fair eye contact.  Musculoskeletal: Strength & Muscle Tone: within normal limits Gait & Station: He has some difficulty walking because of pain. Patient leans: N/A  Psychiatric: Speech (describe rate, volume, coherence, spontaneity, and abnormalities if any): Clear and coherent  Thought Process (describe rate, content, abstract reasoning, and computation): Goal-directed  Associations: Coherent, Relevant and Intact  Thoughts: normal, fund of knowledge adequate  Mental Status: Orientation: oriented to person, place, time/date and situation Mood & Affect: Anxious.  His psychomotor activity is normal. Attention Span & Concentration: Fair  Medical Decision Making (Choose Three): Established Problem, Stable/Improving (1), Review of Last Therapy Session (1) and Review of New Medication or Change in Dosage (2)  Assessment: Axis I: Bipolar disorder NOS  Axis II: Deferred  Axis III: See medical history  Axis IV: Mild to moderate  Axis V: 60-65   Plan:  I recommended to see his primary care physician for abdominal pain to rule out if he has inguinal hernia.  Continue lithium 300 mg twice a day, Prozac, Xanax and trazodone.  The patient does not want to change lithium despite high creatinine.  Recommended to call us back if he has any question on any concern.  Followup in 3 months.  Sharif Rendell T., MD 11/21/2013

## 2013-11-26 DIAGNOSIS — K4090 Unilateral inguinal hernia, without obstruction or gangrene, not specified as recurrent: Secondary | ICD-10-CM | POA: Diagnosis not present

## 2013-12-16 DIAGNOSIS — K4090 Unilateral inguinal hernia, without obstruction or gangrene, not specified as recurrent: Secondary | ICD-10-CM | POA: Diagnosis not present

## 2014-01-05 ENCOUNTER — Encounter: Payer: Self-pay | Admitting: General Surgery

## 2014-01-05 ENCOUNTER — Ambulatory Visit (INDEPENDENT_AMBULATORY_CARE_PROVIDER_SITE_OTHER): Payer: Medicare Other | Admitting: General Surgery

## 2014-01-05 VITALS — BP 134/74 | HR 76 | Resp 14 | Ht 70.5 in | Wt 177.0 lb

## 2014-01-05 DIAGNOSIS — Z950 Presence of cardiac pacemaker: Secondary | ICD-10-CM

## 2014-01-05 DIAGNOSIS — Z45018 Encounter for adjustment and management of other part of cardiac pacemaker: Secondary | ICD-10-CM | POA: Insufficient documentation

## 2014-01-05 DIAGNOSIS — K4090 Unilateral inguinal hernia, without obstruction or gangrene, not specified as recurrent: Secondary | ICD-10-CM | POA: Diagnosis not present

## 2014-01-05 NOTE — Patient Instructions (Addendum)
Patient to be scheduled for right inguinal hernia repair. Patient advised to follow up with Cardiologist prior to hernia repair.   Hernia Repair with Laparoscope A hernia occurs when an internal organ pushes out through a weak spot in the belly (abdominal) wall muscles. Hernias most commonly occur in the groin and around the navel. Hernias can also occur through a cut by the surgeon (incision) after an abdominal operation. A hernia may be caused by:  Lifting heavy objects.  Prolonged coughing.  Straining to move your bowels. Hernias can often be pushed back into place (reduced). Most hernias tend to get worse over time. Problems occur when abdominal contents get stuck in the opening and the blood supply is blocked or impaired (incarcerated hernia). Because of these risks, you require surgery to repair the hernia. Your hernia will be repaired using a laparoscope. Laparoscopic surgery is a type of minimally invasive surgery. It does not involve making a typical surgical cut (incision) in the skin. A laparoscope is a telescope-like rod and lens system. It is usually connected to a video camera and a light source so your caregiver can clearly see the operative area. The instruments are inserted through  to  inch (5 mm or 10 mm) openings in the skin at specific locations. A working and viewing space is created by blowing a small amount of carbon dioxide gas into the abdominal cavity. The abdomen is essentially blown up like a balloon (insufflated). This elevates the abdominal wall above the internal organs like a dome. The carbon dioxide gas is common to the human body and can be absorbed by tissue and removed by the respiratory system. Once the repair is completed, the small incisions will be closed with either stitches (sutures) or staples (just like a paper stapler only this staple holds the skin together). LET YOUR CAREGIVERS KNOW ABOUT:  Allergies.  Medications taken including herbs, eye drops,  over the counter medications, and creams.  Use of steroids (by mouth or creams).  Previous problems with anesthetics or Novocaine.  Possibility of pregnancy, if this applies.  History of blood clots (thrombophlebitis).  History of bleeding or blood problems.  Previous surgery.  Other health problems. BEFORE THE PROCEDURE  Laparoscopy can be done either in a hospital or out-patient clinic. You may be given a mild sedative to help you relax before the procedure. Once in the operating room, you will be given a general anesthesia to make you sleep (unless you and your caregiver choose a different anesthetic).  AFTER THE PROCEDURE  After the procedure you will be watched in a recovery area. Depending on what type of hernia was repaired, you might be admitted to the hospital or you might go home the same day. With this procedure you may have less pain and scarring. This usually results in a quicker recovery and less risk of infection. HOME CARE INSTRUCTIONS   Bed rest is not required. You may continue your normal activities but avoid heavy lifting (more than 10 pounds) or straining.  Cough gently. If you are a smoker it is best to stop, as even the best hernia repair can break down with the continual strain of coughing.  Avoid driving until given the OK by your surgeon.  There are no dietary restrictions unless given otherwise.  TAKE ALL MEDICATIONS AS DIRECTED.  Only take over-the-counter or prescription medicines for pain, discomfort, or fever as directed by your caregiver. SEEK MEDICAL CARE IF:   There is increasing abdominal pain or pain in  your incisions.  There is more bleeding from incisions, other than minimal spotting.  You feel light headed or faint.  You develop an unexplained fever, chills, and/or an oral temperature above 102 F (38.9 C).  You have redness, swelling, or increasing pain in the wound.  Pus coming from wound.  A foul smell coming from the wound or  dressings. SEEK IMMEDIATE MEDICAL CARE IF:   You develop a rash.  You have difficulty breathing.  You have any allergic problems. MAKE SURE YOU:   Understand these instructions.  Will watch your condition.  Will get help right away if you are not doing well or get worse. Document Released: 10/02/2005 Document Revised: 12/25/2011 Document Reviewed: 09/01/2009 [facility] Patient Information 2014 [place], [place].

## 2014-01-05 NOTE — Progress Notes (Signed)
Patient ID: Jeffrey Herring, male   DOB: [birth year], 63 y.o.   MRN: [id number]  Chief Complaint  Patient presents with  . Other    New patient evaluation for right inguinal hernia    HPI Jeffrey Herring is a 63 y.o. male who presents for an evaluation of a right inguinal hernia. The patient was referred by PCP Ashok Norris.  The patient states the hernia has been present for approximately 2 month. He has pain when he is active. The patient describes the pain as a stabbing pain. He states the area is swollen. He is not able to push the hernia back in. He has to lay down and rest in order for the pain to subside. He has not been taking anything for the pain.   The patient has had multiple cardiac pacemaker placed since age 29. He reports that 4 years ago the leads had become encrusted, Necessitating open heart surgery to remove the multiple previously placed leads and placement of a new pacemaker and on the left lower chest with transthoracic leads. He reports during this time a PICC line was required and he developed a DVT and pulmonary embolus. He was initially treated with Coumadin. He is presently on Plavix, but denies any cardiac bypass was completed during the procedure. He has had no recent stent placements. No change in exercise tolerance.  The patient reports that of late, he has had some mild weight loss and a worsening in his baseline cold intolerance, reminiscent of the way he felt prior to his lead excision procedure 4 years ago. He is scheduled to meet with his [facility] cardiologist in the near future to review these symptoms.  The patient is in the midst of a one-year dental restoration for multiple broken teeth. He denies that he was informed of any abscess formation that would preclude elective surgery.  HPI  Past Medical History  Diagnosis Date  . Hyperlipemia   . Pacemaker   . Arrhythmia   . Diabetes insipidus   . History of kidney stones   . History of DVT (deep vein  thrombosis)   . History of pulmonary embolism     Past Surgical History  Procedure Laterality Date  . Cardiac surgery    . Spine surgery    . Nose surgery    . Pacemaker placement      Family History  Problem Relation Age of Onset  . Depression Mother     Social History History  Substance Use Topics  . Smoking status: Current Every Day Smoker -- 0.20 packs/day    Types: Cigars  . Smokeless tobacco: Not on file  . Alcohol Use: No    Allergies  Allergen Reactions  . Aspirin Hives  . Codeine Nausea And Vomiting  . Demerol [Meperidine] Nausea And Vomiting    Current Outpatient Prescriptions  Medication Sig Dispense Refill  . clopidogrel (PLAVIX) 75 MG tablet Take 75 mg by mouth daily.      Marland Kitchen FLUoxetine (PROZAC) 10 MG capsule Take 1 capsule (10 mg total) by mouth daily.  30 capsule  2  . lisinopril (PRINIVIL,ZESTRIL) 5 MG tablet Take 5 mg by mouth daily.      Marland Kitchen lithium carbonate 300 MG capsule Take 1 capsule (300 mg total) by mouth 2 (two) times daily with a meal.  60 capsule  2  . metoprolol succinate (TOPROL-XL) 25 MG 24 hr tablet Take 25 mg by mouth daily. 1/2 tab daily      .  simvastatin (ZOCOR) 10 MG tablet Take 10 mg by mouth at bedtime.      . tamsulosin (FLOMAX) 0.4 MG CAPS Take 0.4 mg by mouth.      . traZODone (DESYREL) 100 MG tablet Take 1 tablet (100 mg total) by mouth at bedtime.  30 tablet  2  . ALPRAZolam (XANAX) 1 MG tablet TAKE 1 TABLET THREE TIMES DAILY  90 tablet  2  . omeprazole (PRILOSEC) 20 MG capsule Take 20 mg by mouth daily.       No current facility-administered medications for this visit.    Review of Systems Review of Systems  Constitutional: Negative.   Respiratory: Negative.   Cardiovascular: Negative.   Gastrointestinal: Positive for abdominal pain.    Blood pressure 134/74, pulse 76, resp. rate 14, height 5' 10.5" (1.791 m), weight 177 lb (80.287 kg).  Physical Exam Physical Exam  Constitutional: He is oriented to person, place,  and time. He appears well-developed and well-nourished.  Cardiovascular: Normal rate, regular rhythm and normal heart sounds.   No murmur heard. Pulmonary/Chest: Effort normal and breath sounds normal.  Abdominal: Soft. Normal appearance and bowel sounds are normal. There is no hepatosplenomegaly. There is no tenderness. A hernia is present. Hernia confirmed positive in the right inguinal area.    Neurological: He is alert and oriented to person, place, and time.  Skin: Skin is warm and dry.    Data Reviewed PCP Notes.  Assessment    Symptomatic right inguinal hernia.  Previous bacteremia with lead contamination.     Plan    Prior to consideration of placement of prosthetic mesh, the patient has been requested to followup with the cardiology service to determine if there is any evidence of recurrent lead infection. If necessary, absorbable mesh could be used for repair, but ideally prosthetic mesh would be utilized as no active infection is present. Care was activities was reviewed. We'll plan for follow up after his upcoming cardiac evaluation.        Robert Bellow 01/06/2014, 6:22 AM

## 2014-01-06 DIAGNOSIS — K4090 Unilateral inguinal hernia, without obstruction or gangrene, not specified as recurrent: Secondary | ICD-10-CM | POA: Insufficient documentation

## 2014-01-08 DIAGNOSIS — Z8679 Personal history of other diseases of the circulatory system: Secondary | ICD-10-CM | POA: Diagnosis not present

## 2014-01-08 DIAGNOSIS — Z950 Presence of cardiac pacemaker: Secondary | ICD-10-CM | POA: Diagnosis not present

## 2014-01-08 DIAGNOSIS — Q246 Congenital heart block: Secondary | ICD-10-CM | POA: Diagnosis not present

## 2014-01-08 DIAGNOSIS — I10 Essential (primary) hypertension: Secondary | ICD-10-CM | POA: Diagnosis not present

## 2014-01-08 DIAGNOSIS — I2510 Atherosclerotic heart disease of native coronary artery without angina pectoris: Secondary | ICD-10-CM | POA: Diagnosis not present

## 2014-01-08 DIAGNOSIS — R5381 Other malaise: Secondary | ICD-10-CM | POA: Diagnosis not present

## 2014-01-08 DIAGNOSIS — I442 Atrioventricular block, complete: Secondary | ICD-10-CM | POA: Diagnosis not present

## 2014-01-08 DIAGNOSIS — Z45018 Encounter for adjustment and management of other part of cardiac pacemaker: Secondary | ICD-10-CM | POA: Diagnosis not present

## 2014-01-29 ENCOUNTER — Encounter: Payer: Self-pay | Admitting: General Surgery

## 2014-01-29 ENCOUNTER — Other Ambulatory Visit: Payer: Self-pay | Admitting: General Surgery

## 2014-01-29 ENCOUNTER — Telehealth: Payer: Self-pay

## 2014-01-29 DIAGNOSIS — K4090 Unilateral inguinal hernia, without obstruction or gangrene, not specified as recurrent: Secondary | ICD-10-CM

## 2014-01-29 NOTE — Telephone Encounter (Signed)
Spoke with patient's wife and have arranged for the patient to have surgery at [facility] for 02/05/14. He will hold his Plavix for 1 week before this, he has been off of his Plavix for the past 2 days already. He will pre admit at [facility] on 02/02/14 at 9:15 am. Patient is aware of date, time, and instructions.

## 2014-01-29 NOTE — Progress Notes (Signed)
Patient ID: Jeffrey Herring, male   DOB: 05/03/1951, 63 y.o.   MRN: [id number]   Records from [facility] cardiology, [place] , [place] had been received in reviewed.  Echocardiogram dated 01/08/2014 shows an ejection fraction of 30-35%. Mild to moderate mitral and tricuspid regurgitation. Normal ventricular size. No pericardial effusion.  Pacemaker assessment completed 01/08/2014 shows the patient pacemaker dependent. Magnet over device has been recommended for OR safety.  Laboratory studies of 01/08/2014 showed a normal CBC with a white blood cell count of 8300, 77% polys, 11% lymphocytes, hemoglobin 13.0. Blood cultures reported 01/15/2014 were no growth at 5 days.  Cardiology note dated 01/08/2014 was reviewed. No contraindication to surgical intervention noted.  Coronary arteriogram dated 05/19/2013 showed an occluded right coronary with appropriate collaterals. No other significant lesions.  We'll proceed with planned right inguinal hernia repair on 02/05/2014.

## 2014-02-02 ENCOUNTER — Ambulatory Visit: Payer: Self-pay | Admitting: General Surgery

## 2014-02-02 DIAGNOSIS — I10 Essential (primary) hypertension: Secondary | ICD-10-CM | POA: Diagnosis not present

## 2014-02-02 DIAGNOSIS — Z01812 Encounter for preprocedural laboratory examination: Secondary | ICD-10-CM | POA: Diagnosis not present

## 2014-02-02 DIAGNOSIS — Z01810 Encounter for preprocedural cardiovascular examination: Secondary | ICD-10-CM | POA: Diagnosis not present

## 2014-02-02 DIAGNOSIS — I119 Hypertensive heart disease without heart failure: Secondary | ICD-10-CM | POA: Diagnosis not present

## 2014-02-02 DIAGNOSIS — Z7901 Long term (current) use of anticoagulants: Secondary | ICD-10-CM | POA: Diagnosis not present

## 2014-02-02 DIAGNOSIS — K4090 Unilateral inguinal hernia, without obstruction or gangrene, not specified as recurrent: Secondary | ICD-10-CM | POA: Diagnosis not present

## 2014-02-02 LAB — BASIC METABOLIC PANEL
Anion Gap: 1 — ABNORMAL LOW (ref 7–16)
BUN: 31 mg/dL — ABNORMAL HIGH (ref 7–18)
Calcium, Total: 9.3 mg/dL (ref 8.5–10.1)
Chloride: 111 mmol/L — ABNORMAL HIGH (ref 98–107)
Co2: 23 mmol/L (ref 21–32)
Creatinine: 2.73 mg/dL — ABNORMAL HIGH (ref 0.60–1.30)
EGFR (African American): 27 — ABNORMAL LOW
EGFR (Non-African Amer.): 24 — ABNORMAL LOW
Glucose: 106 mg/dL — ABNORMAL HIGH (ref 65–99)
Osmolality: 277 (ref 275–301)
Potassium: 4.5 mmol/L (ref 3.5–5.1)
Sodium: 135 mmol/L — ABNORMAL LOW (ref 136–145)

## 2014-02-02 LAB — PROTIME-INR
INR: 1.1
Prothrombin Time: 13.7 secs (ref 11.5–14.7)

## 2014-02-02 LAB — APTT: Activated PTT: 34.8 secs (ref 23.6–35.9)

## 2014-02-03 ENCOUNTER — Encounter: Payer: Self-pay | Admitting: General Surgery

## 2014-02-05 ENCOUNTER — Ambulatory Visit: Payer: Self-pay | Admitting: General Surgery

## 2014-02-05 ENCOUNTER — Encounter: Payer: Self-pay | Admitting: General Surgery

## 2014-02-05 DIAGNOSIS — F17200 Nicotine dependence, unspecified, uncomplicated: Secondary | ICD-10-CM | POA: Diagnosis not present

## 2014-02-05 DIAGNOSIS — Z86711 Personal history of pulmonary embolism: Secondary | ICD-10-CM | POA: Diagnosis not present

## 2014-02-05 DIAGNOSIS — Z885 Allergy status to narcotic agent status: Secondary | ICD-10-CM | POA: Diagnosis not present

## 2014-02-05 DIAGNOSIS — F3289 Other specified depressive episodes: Secondary | ICD-10-CM | POA: Diagnosis not present

## 2014-02-05 DIAGNOSIS — E232 Diabetes insipidus: Secondary | ICD-10-CM | POA: Diagnosis not present

## 2014-02-05 DIAGNOSIS — F329 Major depressive disorder, single episode, unspecified: Secondary | ICD-10-CM | POA: Diagnosis not present

## 2014-02-05 DIAGNOSIS — Z950 Presence of cardiac pacemaker: Secondary | ICD-10-CM | POA: Diagnosis not present

## 2014-02-05 DIAGNOSIS — K4090 Unilateral inguinal hernia, without obstruction or gangrene, not specified as recurrent: Secondary | ICD-10-CM | POA: Diagnosis not present

## 2014-02-05 DIAGNOSIS — Z87442 Personal history of urinary calculi: Secondary | ICD-10-CM | POA: Diagnosis not present

## 2014-02-05 DIAGNOSIS — Z886 Allergy status to analgesic agent status: Secondary | ICD-10-CM | POA: Diagnosis not present

## 2014-02-05 DIAGNOSIS — E785 Hyperlipidemia, unspecified: Secondary | ICD-10-CM | POA: Diagnosis not present

## 2014-02-05 DIAGNOSIS — Z79899 Other long term (current) drug therapy: Secondary | ICD-10-CM | POA: Diagnosis not present

## 2014-02-05 DIAGNOSIS — I499 Cardiac arrhythmia, unspecified: Secondary | ICD-10-CM | POA: Diagnosis not present

## 2014-02-05 DIAGNOSIS — Z86718 Personal history of other venous thrombosis and embolism: Secondary | ICD-10-CM | POA: Diagnosis not present

## 2014-02-05 DIAGNOSIS — Z7901 Long term (current) use of anticoagulants: Secondary | ICD-10-CM | POA: Diagnosis not present

## 2014-02-05 HISTORY — PX: HERNIA REPAIR: SHX51

## 2014-02-18 ENCOUNTER — Ambulatory Visit (INDEPENDENT_AMBULATORY_CARE_PROVIDER_SITE_OTHER): Payer: Medicare Other | Admitting: Psychiatry

## 2014-02-18 ENCOUNTER — Encounter (HOSPITAL_COMMUNITY): Payer: Self-pay | Admitting: Psychiatry

## 2014-02-18 VITALS — BP 115/68 | HR 82 | Ht 73.0 in | Wt 162.0 lb

## 2014-02-18 DIAGNOSIS — F319 Bipolar disorder, unspecified: Secondary | ICD-10-CM

## 2014-02-18 MED ORDER — FLUOXETINE HCL 10 MG PO CAPS: 10.0000 mg | ORAL_CAPSULE | Freq: Every day | ORAL | Status: DC

## 2014-02-18 MED ORDER — ALPRAZOLAM 1 MG PO TABS: ORAL_TABLET | ORAL | Status: DC

## 2014-02-18 MED ORDER — TRAZODONE HCL 100 MG PO TABS: 100.0000 mg | ORAL_TABLET | Freq: Every day | ORAL | Status: DC

## 2014-02-18 MED ORDER — LITHIUM CARBONATE 150 MG PO CAPS: ORAL_CAPSULE | ORAL | Status: DC

## 2014-02-18 NOTE — Patient Instructions (Signed)
Lithium dose is decreased from 300 mg twice a day to 150 mg 2 capsule in the morning and 1 in the evening.

## 2014-02-18 NOTE — Progress Notes (Signed)
Export [phone] Progress Note  Jeffrey Herring [id number] 63 y.o.  02/18/2014 11:23 AM  Chief Complaint:  Medication management and followup.         History of Present Illness: Jeffrey Herring came for his followup appointment by himself.  Usually he comes with his wife.  Patient has surgical procedures last month and he has hernia taken out .  Patient is overall feeling  better.  He did not talk much about his psychiatric issues but admitted that he has a lot of health issues going on.  He reported sleeping is on and off and he does require his psychotropic medication for insomnia irritability and anger.  He is taking Xanax 1 mg 3 times a day.  He has difficulty remembering things.  His attention and concentration his poor.  He does not recall any agitation, anger or any severe mood swings.  I reviewed his blood work.  It is done on April 13 .  His creatinine is 2.73 and his BUN is 31.  (His last BUN was 23 and creatinine 2.25) We have multiple times discussion about his lithium dosage because of this high creatinine.  His wife has insisted in the past at he should continue the lithium at present does however I am concerned because of his high creatinine level.  I discussed her lower down his lithium with him which he agreed.  We have tried other medication but patient did not respond very well with other mood stabilizers.  Suicidal Ideation: No Plan Formed: No Patient has means to carry out plan: No  Homicidal Ideation: No Plan Formed: No Patient has means to carry out plan: No  Review of Systems: Psychiatric: Agitation: No Hallucination: No Depressed Mood: No Insomnia: Yes Hypersomnia: No Altered Concentration: No Feels Worthless: No Grandiose Ideas: No Belief In Special Powers: No New/Increased Substance Abuse: No Compulsions: No  Neurologic: Headache: No Seizure: No Paresthesias: No  Medical History; Patient has history of arrhythmia, history of pulmonary embolism and  DVT, history of nephrolithiasis , benign prostate hypertrophy, history of chronic kidney disease , anemia and hyperlipidemia.  He has a pacemaker .   he has hernia taken out recently.  Outpatient Encounter Prescriptions as of 02/18/2014  Medication Sig  . ALPRAZolam (XANAX) 1 MG tablet TAKE 1 TABLET THREE TIMES DAILY  . FLUoxetine (PROZAC) 10 MG capsule Take 1 capsule (10 mg total) by mouth daily.  Marland Kitchen lithium carbonate 150 MG capsule Take 2 capsule in morning and 1 in evening  . traZODone (DESYREL) 100 MG tablet Take 1 tablet (100 mg total) by mouth at bedtime.  . [DISCONTINUED] ALPRAZolam (XANAX) 1 MG tablet TAKE 1 TABLET THREE TIMES DAILY  . [DISCONTINUED] FLUoxetine (PROZAC) 10 MG capsule Take 1 capsule (10 mg total) by mouth daily.  . [DISCONTINUED] lithium carbonate 300 MG capsule Take 1 capsule (300 mg total) by mouth 2 (two) times daily with a meal.  . [DISCONTINUED] traZODone (DESYREL) 100 MG tablet Take 1 tablet (100 mg total) by mouth at bedtime.  . clopidogrel (PLAVIX) 75 MG tablet Take 75 mg by mouth daily.  Marland Kitchen lisinopril (PRINIVIL,ZESTRIL) 5 MG tablet Take 5 mg by mouth daily.  . metoprolol succinate (TOPROL-XL) 25 MG 24 hr tablet Take 25 mg by mouth daily. 1/2 tab daily  . omeprazole (PRILOSEC) 20 MG capsule Take 20 mg by mouth daily.  . simvastatin (ZOCOR) 10 MG tablet Take 10 mg by mouth at bedtime.  . tamsulosin (FLOMAX) 0.4 MG CAPS Take 0.4  mg by mouth.    Past Psychiatric History/Hospitalization(s): Anxiety: Yes Bipolar Disorder: Yes Depression: Yes Mania: Yes Psychosis: Yes Schizophrenia: No Personality Disorder: No Hospitalization for psychiatric illness: Patient is been seeing in this office since 2007.  He was admitted at behavioral Center in 2006 due to psychotic episode.  Patient has a history of anger mood swing and poor impulse control.  In the past he has taken Zyprexa, Abilify, Risperdal, Lexapro and Wellbutrin.  He's stable on his medication since 2010. History  of Electroconvulsive Shock Therapy: No Prior Suicide Attempts: Yes  Physical Exam: Constitutional:  BP 115/68  Pulse 82  Ht 6\' 1"  (1.854 m)  Wt 162 lb (73.483 kg)  BMI 21.38 kg/m2  General Appearance: well nourished and Casually dressed.  He maintained fair eye contact.  Musculoskeletal: Strength & Muscle Tone: within normal limits Gait & Station: He has some difficulty walking because of pain. Patient leans: N/A   Mental Status Examination; Patient is casually dressed.  His speech is fast and at times rambling.  His thought processes circumstantial.  His attention and concentration is distracted.  He denies any paranoia, hallucination or any obsessive thoughts.  He denies any auditory or visual hallucination.  He appears restless and his psychomotor activity is slightly increased.  He described his mood is okay and his affect is neutral.  There were no delusions at this time.  He has difficulty organizing his thoughts.  He is alert and oriented x3 however he has difficulty remembering things.  His fund of knowledge is adequate.  His insight judgment and impulse control is okay.   Established Problem, Stable/Improving (1), New problem, with additional work up planned, Decision to obtain old records (1), Review of Last Therapy Session (1), Review of Medication Regimen & Side Effects (2) and Review of New Medication or Change in Dosage (2)  Assessment: Axis I: Bipolar disorder NOS  Axis II: Deferred  Axis III: See medical history  Axis IV: Mild to moderate  Axis V: 60-65   Plan:  I reviewed blood results, discharge summary , psychosocial stressors and his current medication.  Due to the fact that he has high creatinine from the past I lowered his lithium dose .  He will continue lithium 300 mg in the morning and the new dose will be 150 in the evening.  I will continue Prozac Xanax and trazodone at present dose.  I have provided written instruction to him.  On his next visit the  lithium level.  Recommended to call us back if he has any question on any concern.  Followup in 3 months.  Time spent 25 minutes.  More than 50% of the time spent in psychoeducation, counseling and coordination of care.  Discuss safety plan that anytime having active suicidal thoughts or homicidal thoughts then patient need to call 911 or go to the local emergency room.    Hennessey Cantrell T., MD 02/18/2014

## 2014-02-19 ENCOUNTER — Ambulatory Visit (INDEPENDENT_AMBULATORY_CARE_PROVIDER_SITE_OTHER): Payer: Medicare Other | Admitting: General Surgery

## 2014-02-19 ENCOUNTER — Encounter: Payer: Self-pay | Admitting: General Surgery

## 2014-02-19 VITALS — BP 130/64 | HR 68 | Resp 12 | Ht 70.0 in | Wt 161.0 lb

## 2014-02-19 DIAGNOSIS — K4090 Unilateral inguinal hernia, without obstruction or gangrene, not specified as recurrent: Secondary | ICD-10-CM

## 2014-02-19 NOTE — Progress Notes (Signed)
Patient ID: Jeffrey Herring, male   DOB: [birth year], 63 y.o.   MRN: [id number]  Chief Complaint  Patient presents with  . Routine Post Op    right inguinal hernia    HPI Jeffrey Herring is a 63 y.o. male here today for his post op right inguinal hernia repair done on 02/05/14. Patient states he is doing well.  HPI  Past Medical History  Diagnosis Date  . Hyperlipemia   . Pacemaker   . Arrhythmia   . Diabetes insipidus   . History of kidney stones   . History of DVT (deep vein thrombosis)   . History of pulmonary embolism     Past Surgical History  Procedure Laterality Date  . Cardiac surgery    . Spine surgery    . Nose surgery    . Pacemaker placement    . Hernia repair Right 02/05/14    Family History  Problem Relation Age of Onset  . Depression Mother     Social History History  Substance Use Topics  . Smoking status: Current Every Day Smoker -- 0.20 packs/day    Types: Cigars  . Smokeless tobacco: Never Used  . Alcohol Use: No    Allergies  Allergen Reactions  . Aspirin Hives  . Codeine Nausea And Vomiting  . Demerol [Meperidine] Nausea And Vomiting    Current Outpatient Prescriptions  Medication Sig Dispense Refill  . ALPRAZolam (XANAX) 1 MG tablet TAKE 1 TABLET THREE TIMES DAILY  90 tablet  2  . clopidogrel (PLAVIX) 75 MG tablet Take 75 mg by mouth daily.      Marland Kitchen FLUoxetine (PROZAC) 10 MG capsule Take 1 capsule (10 mg total) by mouth daily.  30 capsule  2  . lisinopril (PRINIVIL,ZESTRIL) 5 MG tablet Take 5 mg by mouth daily.      Marland Kitchen lithium carbonate 150 MG capsule Take 2 capsule in morning and 1 in evening  90 capsule  2  . metoprolol succinate (TOPROL-XL) 25 MG 24 hr tablet Take 25 mg by mouth daily. 1/2 tab daily      . omeprazole (PRILOSEC) 20 MG capsule Take 20 mg by mouth daily.      . simvastatin (ZOCOR) 10 MG tablet Take 10 mg by mouth at bedtime.      . tamsulosin (FLOMAX) 0.4 MG CAPS Take 0.4 mg by mouth.      . traZODone (DESYREL) 100 MG  tablet Take 1 tablet (100 mg total) by mouth at bedtime.  30 tablet  2   No current facility-administered medications for this visit.    Review of Systems Review of Systems  Constitutional: Negative.   Respiratory: Negative.   Cardiovascular: Negative.     Blood pressure 130/64, pulse 68, resp. rate 12, height 5\' 10"  (1.778 m), weight 161 lb (73.029 kg).  Physical Exam Physical Exam  Constitutional: He is oriented to person, place, and time. He appears well-developed and well-nourished.  Abdominal:  Thickening along the scar, mild bruising.  Neurological: He is alert and oriented to person, place, and time.  Skin: Skin is warm and dry.    Assessment    Doing well post hernia repair.     Plan    The patient will resume activities as he is comfortable.  He is aware of careful lifting technique.       PCP: Joyce Gross Tonjia Parillo 02/22/2014, 10:02 AM

## 2014-02-19 NOTE — Patient Instructions (Addendum)
Patient to return in one month.. The patient is aware to use a heating pad as needed for comfort.Proper lifting techniques reviewed.

## 2014-03-23 ENCOUNTER — Encounter: Payer: Self-pay | Admitting: General Surgery

## 2014-03-23 ENCOUNTER — Ambulatory Visit (INDEPENDENT_AMBULATORY_CARE_PROVIDER_SITE_OTHER): Payer: Medicare Other | Admitting: General Surgery

## 2014-03-23 VITALS — BP 130/70 | HR 68 | Resp 12 | Ht 70.0 in | Wt 161.0 lb

## 2014-03-23 DIAGNOSIS — K4090 Unilateral inguinal hernia, without obstruction or gangrene, not specified as recurrent: Secondary | ICD-10-CM

## 2014-03-23 NOTE — Progress Notes (Signed)
Patient ID: Jeffrey Herring, male   DOB: 1951/02/20, 62 y.o.   MRN: [id number]  Chief Complaint  Patient presents with  . Routine Post Op    right inguinal hernia repair     HPI Jeffrey Herring is a 63 y.o. male here today for his one month right inguinal hernia repair done on 02/05/14. Patient states he is doing well.   HPI  Past Medical History  Diagnosis Date  . Hyperlipemia   . Pacemaker   . Arrhythmia   . Diabetes insipidus   . History of kidney stones   . History of DVT (deep vein thrombosis)   . History of pulmonary embolism     Past Surgical History  Procedure Laterality Date  . Cardiac surgery    . Spine surgery    . Nose surgery    . Pacemaker placement    . Hernia repair Right 02/05/14    Family History  Problem Relation Age of Onset  . Depression Mother     Social History History  Substance Use Topics  . Smoking status: Current Every Day Smoker -- 0.20 packs/day    Types: Cigars  . Smokeless tobacco: Never Used  . Alcohol Use: No    Allergies  Allergen Reactions  . Aspirin Hives  . Codeine Nausea And Vomiting  . Demerol [Meperidine] Nausea And Vomiting    Current Outpatient Prescriptions  Medication Sig Dispense Refill  . ALPRAZolam (XANAX) 1 MG tablet TAKE 1 TABLET THREE TIMES DAILY  90 tablet  2  . clopidogrel (PLAVIX) 75 MG tablet Take 75 mg by mouth daily.      Marland Kitchen FLUoxetine (PROZAC) 10 MG capsule Take 1 capsule (10 mg total) by mouth daily.  30 capsule  2  . lisinopril (PRINIVIL,ZESTRIL) 5 MG tablet Take 5 mg by mouth daily.      Marland Kitchen lithium carbonate 150 MG capsule Take 2 capsule in morning and 1 in evening  90 capsule  2  . metoprolol succinate (TOPROL-XL) 25 MG 24 hr tablet Take 25 mg by mouth daily. 1/2 tab daily      . omeprazole (PRILOSEC) 20 MG capsule Take 20 mg by mouth daily.      . simvastatin (ZOCOR) 10 MG tablet Take 10 mg by mouth at bedtime.      . tamsulosin (FLOMAX) 0.4 MG CAPS Take 0.4 mg by mouth.      . traZODone (DESYREL)  100 MG tablet Take 1 tablet (100 mg total) by mouth at bedtime.  30 tablet  2   No current facility-administered medications for this visit.    Review of Systems Review of Systems  Constitutional: Negative.   Respiratory: Negative.   Cardiovascular: Negative.     Blood pressure 130/70, pulse 68, resp. rate 12, height 5\' 10"  (1.778 m), weight 161 lb (73.029 kg).  Physical Exam Physical Exam  Constitutional: He is oriented to person, place, and time. He appears well-developed and well-nourished.  Eyes: Conjunctivae are normal. No scleral icterus.  Neck: Neck supple.  Cardiovascular: Normal rate, regular rhythm and normal heart sounds.   Pulmonary/Chest: Effort normal and breath sounds normal.  Abdominal: Soft. Normal appearance and bowel sounds are normal.  Right inguinal site is well healed   Neurological: He is alert and oriented to person, place, and time.  Skin: Skin is warm and dry.       Assessment    Doing well status post inguinal hernia repair.     Plan  Good judgment with strenuous activity was encouraged. The patient reported that he is appreciating or tremors. He was encouraged to discuss this with his primary care physician.     PCP: Joyce Gross Rokhaya Quinn 03/23/2014, 9:30 PM

## 2014-03-23 NOTE — Patient Instructions (Signed)
Proper lifting techniques reviewed.

## 2014-04-09 ENCOUNTER — Other Ambulatory Visit (HOSPITAL_COMMUNITY): Payer: Self-pay | Admitting: Psychiatry

## 2014-04-09 ENCOUNTER — Inpatient Hospital Stay (HOSPITAL_COMMUNITY)
Admission: EM | Admit: 2014-04-09 | Discharge: 2014-04-23 | DRG: 871 | Disposition: A | Discharge status: Skilled Nursing Facility | Payer: Medicare Other | Attending: Internal Medicine | Admitting: Internal Medicine

## 2014-04-09 ENCOUNTER — Telehealth (HOSPITAL_COMMUNITY): Payer: Self-pay

## 2014-04-09 ENCOUNTER — Emergency Department (HOSPITAL_COMMUNITY): Payer: Medicare Other

## 2014-04-09 ENCOUNTER — Telehealth (HOSPITAL_COMMUNITY): Payer: Self-pay | Admitting: Psychiatry

## 2014-04-09 ENCOUNTER — Encounter (HOSPITAL_COMMUNITY): Payer: Self-pay | Admitting: Emergency Medicine

## 2014-04-09 DIAGNOSIS — Z86718 Personal history of other venous thrombosis and embolism: Secondary | ICD-10-CM | POA: Diagnosis not present

## 2014-04-09 DIAGNOSIS — Z886 Allergy status to analgesic agent status: Secondary | ICD-10-CM | POA: Diagnosis not present

## 2014-04-09 DIAGNOSIS — R339 Retention of urine, unspecified: Secondary | ICD-10-CM | POA: Diagnosis not present

## 2014-04-09 DIAGNOSIS — N179 Acute kidney failure, unspecified: Secondary | ICD-10-CM | POA: Diagnosis not present

## 2014-04-09 DIAGNOSIS — G9340 Encephalopathy, unspecified: Secondary | ICD-10-CM

## 2014-04-09 DIAGNOSIS — D631 Anemia in chronic kidney disease: Secondary | ICD-10-CM | POA: Diagnosis present

## 2014-04-09 DIAGNOSIS — F17200 Nicotine dependence, unspecified, uncomplicated: Secondary | ICD-10-CM | POA: Diagnosis present

## 2014-04-09 DIAGNOSIS — I10 Essential (primary) hypertension: Secondary | ICD-10-CM | POA: Diagnosis not present

## 2014-04-09 DIAGNOSIS — A419 Sepsis, unspecified organism: Principal | ICD-10-CM | POA: Diagnosis present

## 2014-04-09 DIAGNOSIS — K4090 Unilateral inguinal hernia, without obstruction or gangrene, not specified as recurrent: Secondary | ICD-10-CM | POA: Diagnosis present

## 2014-04-09 DIAGNOSIS — T438X5A Adverse effect of other psychotropic drugs, initial encounter: Secondary | ICD-10-CM | POA: Diagnosis present

## 2014-04-09 DIAGNOSIS — E860 Dehydration: Secondary | ICD-10-CM

## 2014-04-09 DIAGNOSIS — Y33XXXS Other specified events, undetermined intent, sequela: Secondary | ICD-10-CM | POA: Diagnosis not present

## 2014-04-09 DIAGNOSIS — F29 Unspecified psychosis not due to a substance or known physiological condition: Secondary | ICD-10-CM | POA: Diagnosis not present

## 2014-04-09 DIAGNOSIS — G92 Toxic encephalopathy: Secondary | ICD-10-CM | POA: Diagnosis present

## 2014-04-09 DIAGNOSIS — G929 Unspecified toxic encephalopathy: Secondary | ICD-10-CM | POA: Diagnosis present

## 2014-04-09 DIAGNOSIS — E43 Unspecified severe protein-calorie malnutrition: Secondary | ICD-10-CM | POA: Diagnosis not present

## 2014-04-09 DIAGNOSIS — Z885 Allergy status to narcotic agent status: Secondary | ICD-10-CM

## 2014-04-09 DIAGNOSIS — N183 Chronic kidney disease, stage 3 unspecified: Secondary | ICD-10-CM | POA: Diagnosis not present

## 2014-04-09 DIAGNOSIS — N400 Enlarged prostate without lower urinary tract symptoms: Secondary | ICD-10-CM | POA: Diagnosis not present

## 2014-04-09 DIAGNOSIS — N2581 Secondary hyperparathyroidism of renal origin: Secondary | ICD-10-CM | POA: Diagnosis present

## 2014-04-09 DIAGNOSIS — N189 Chronic kidney disease, unspecified: Secondary | ICD-10-CM | POA: Diagnosis not present

## 2014-04-09 DIAGNOSIS — G9349 Other encephalopathy: Secondary | ICD-10-CM | POA: Diagnosis not present

## 2014-04-09 DIAGNOSIS — I509 Heart failure, unspecified: Secondary | ICD-10-CM | POA: Diagnosis present

## 2014-04-09 DIAGNOSIS — R4182 Altered mental status, unspecified: Secondary | ICD-10-CM | POA: Diagnosis not present

## 2014-04-09 DIAGNOSIS — D72829 Elevated white blood cell count, unspecified: Secondary | ICD-10-CM | POA: Diagnosis present

## 2014-04-09 DIAGNOSIS — Z950 Presence of cardiac pacemaker: Secondary | ICD-10-CM | POA: Diagnosis not present

## 2014-04-09 DIAGNOSIS — J9819 Other pulmonary collapse: Secondary | ICD-10-CM | POA: Diagnosis not present

## 2014-04-09 DIAGNOSIS — R410 Disorientation, unspecified: Secondary | ICD-10-CM

## 2014-04-09 DIAGNOSIS — Z7902 Long term (current) use of antithrombotics/antiplatelets: Secondary | ICD-10-CM | POA: Diagnosis not present

## 2014-04-09 DIAGNOSIS — M6281 Muscle weakness (generalized): Secondary | ICD-10-CM | POA: Diagnosis not present

## 2014-04-09 DIAGNOSIS — T6591XS Toxic effect of unspecified substance, accidental (unintentional), sequela: Secondary | ICD-10-CM | POA: Diagnosis not present

## 2014-04-09 DIAGNOSIS — R279 Unspecified lack of coordination: Secondary | ICD-10-CM | POA: Diagnosis not present

## 2014-04-09 DIAGNOSIS — N139 Obstructive and reflux uropathy, unspecified: Secondary | ICD-10-CM | POA: Diagnosis present

## 2014-04-09 DIAGNOSIS — F919 Conduct disorder, unspecified: Secondary | ICD-10-CM | POA: Diagnosis not present

## 2014-04-09 DIAGNOSIS — I129 Hypertensive chronic kidney disease with stage 1 through stage 4 chronic kidney disease, or unspecified chronic kidney disease: Secondary | ICD-10-CM | POA: Diagnosis present

## 2014-04-09 DIAGNOSIS — Z8619 Personal history of other infectious and parasitic diseases: Secondary | ICD-10-CM | POA: Diagnosis present

## 2014-04-09 DIAGNOSIS — E785 Hyperlipidemia, unspecified: Secondary | ICD-10-CM | POA: Diagnosis present

## 2014-04-09 DIAGNOSIS — IMO0002 Reserved for concepts with insufficient information to code with codable children: Secondary | ICD-10-CM

## 2014-04-09 DIAGNOSIS — E232 Diabetes insipidus: Secondary | ICD-10-CM | POA: Diagnosis present

## 2014-04-09 DIAGNOSIS — R6889 Other general symptoms and signs: Secondary | ICD-10-CM | POA: Diagnosis not present

## 2014-04-09 DIAGNOSIS — J189 Pneumonia, unspecified organism: Secondary | ICD-10-CM | POA: Diagnosis present

## 2014-04-09 DIAGNOSIS — R404 Transient alteration of awareness: Secondary | ICD-10-CM | POA: Diagnosis not present

## 2014-04-09 DIAGNOSIS — T5691XA Toxic effect of unspecified metal, accidental (unintentional), initial encounter: Secondary | ICD-10-CM | POA: Diagnosis not present

## 2014-04-09 DIAGNOSIS — Z86711 Personal history of pulmonary embolism: Secondary | ICD-10-CM | POA: Diagnosis not present

## 2014-04-09 DIAGNOSIS — F3130 Bipolar disorder, current episode depressed, mild or moderate severity, unspecified: Secondary | ICD-10-CM | POA: Diagnosis not present

## 2014-04-09 DIAGNOSIS — N039 Chronic nephritic syndrome with unspecified morphologic changes: Secondary | ICD-10-CM

## 2014-04-09 DIAGNOSIS — E870 Hyperosmolality and hypernatremia: Secondary | ICD-10-CM | POA: Diagnosis not present

## 2014-04-09 DIAGNOSIS — T56894S Toxic effect of other metals, undetermined, sequela: Secondary | ICD-10-CM

## 2014-04-09 DIAGNOSIS — E875 Hyperkalemia: Secondary | ICD-10-CM | POA: Diagnosis not present

## 2014-04-09 DIAGNOSIS — R509 Fever, unspecified: Secondary | ICD-10-CM | POA: Diagnosis not present

## 2014-04-09 DIAGNOSIS — I5022 Chronic systolic (congestive) heart failure: Secondary | ICD-10-CM | POA: Diagnosis present

## 2014-04-09 DIAGNOSIS — F319 Bipolar disorder, unspecified: Secondary | ICD-10-CM | POA: Diagnosis present

## 2014-04-09 DIAGNOSIS — T56891A Toxic effect of other metals, accidental (unintentional), initial encounter: Secondary | ICD-10-CM

## 2014-04-09 DIAGNOSIS — R262 Difficulty in walking, not elsewhere classified: Secondary | ICD-10-CM | POA: Diagnosis not present

## 2014-04-09 DIAGNOSIS — T56894A Toxic effect of other metals, undetermined, initial encounter: Secondary | ICD-10-CM | POA: Diagnosis not present

## 2014-04-09 LAB — URINALYSIS, ROUTINE W REFLEX MICROSCOPIC
Bilirubin Urine: NEGATIVE
Glucose, UA: NEGATIVE mg/dL
Hgb urine dipstick: NEGATIVE
Ketones, ur: NEGATIVE mg/dL
Nitrite: NEGATIVE
Protein, ur: NEGATIVE mg/dL
Specific Gravity, Urine: 1.007 (ref 1.005–1.030)
Urobilinogen, UA: 0.2 mg/dL (ref 0.0–1.0)
pH: 6 (ref 5.0–8.0)

## 2014-04-09 LAB — CBC WITH DIFFERENTIAL/PLATELET
Basophils Absolute: 0 10*3/uL (ref 0.0–0.1)
Basophils Relative: 0 % (ref 0–1)
Eosinophils Absolute: 0.2 10*3/uL (ref 0.0–0.7)
Eosinophils Relative: 1 % (ref 0–5)
HCT: 34 % — ABNORMAL LOW (ref 39.0–52.0)
Hemoglobin: 11.3 g/dL — ABNORMAL LOW (ref 13.0–17.0)
Lymphocytes Relative: 8 % — ABNORMAL LOW (ref 12–46)
Lymphs Abs: 0.9 10*3/uL (ref 0.7–4.0)
MCH: 30.4 pg (ref 26.0–34.0)
MCHC: 33.2 g/dL (ref 30.0–36.0)
MCV: 91.4 fL (ref 78.0–100.0)
Monocytes Absolute: 1.4 10*3/uL — ABNORMAL HIGH (ref 0.1–1.0)
Monocytes Relative: 12 % (ref 3–12)
Neutro Abs: 9.8 10*3/uL — ABNORMAL HIGH (ref 1.7–7.7)
Neutrophils Relative %: 79 % — ABNORMAL HIGH (ref 43–77)
Platelets: 203 10*3/uL (ref 150–400)
RBC: 3.72 MIL/uL — ABNORMAL LOW (ref 4.22–5.81)
RDW: 13.6 % (ref 11.5–15.5)
WBC: 12.3 10*3/uL — ABNORMAL HIGH (ref 4.0–10.5)

## 2014-04-09 LAB — CBG MONITORING, ED: Glucose-Capillary: 134 mg/dL — ABNORMAL HIGH (ref 70–99)

## 2014-04-09 LAB — COMPREHENSIVE METABOLIC PANEL
ALT: 10 U/L (ref 0–53)
AST: 15 U/L (ref 0–37)
Albumin: 4.9 g/dL (ref 3.5–5.2)
Alkaline Phosphatase: 102 U/L (ref 39–117)
BUN: 35 mg/dL — ABNORMAL HIGH (ref 6–23)
CO2: 17 mEq/L — ABNORMAL LOW (ref 19–32)
Calcium: 11.1 mg/dL — ABNORMAL HIGH (ref 8.4–10.5)
Chloride: 104 mEq/L (ref 96–112)
Creatinine, Ser: 3.12 mg/dL — ABNORMAL HIGH (ref 0.50–1.35)
GFR calc Af Amer: 23 mL/min — ABNORMAL LOW (ref >90)
GFR calc non Af Amer: 20 mL/min — ABNORMAL LOW (ref >90)
Glucose, Bld: 125 mg/dL — ABNORMAL HIGH (ref 70–99)
Potassium: 4.6 mEq/L (ref 3.7–5.3)
Sodium: 135 mEq/L — ABNORMAL LOW (ref 137–147)
Total Bilirubin: 0.7 mg/dL (ref 0.3–1.2)
Total Protein: 8.1 g/dL (ref 6.0–8.3)

## 2014-04-09 LAB — I-STAT CG4 LACTIC ACID, ED: Lactic Acid, Venous: 1.55 mmol/L (ref 0.5–2.2)

## 2014-04-09 LAB — I-STAT TROPONIN, ED: Troponin i, poc: 0.02 ng/mL (ref 0.00–0.08)

## 2014-04-09 LAB — URINE MICROSCOPIC-ADD ON

## 2014-04-09 LAB — HIV ANTIBODY (ROUTINE TESTING W REFLEX): HIV 1&2 Ab, 4th Generation: NONREACTIVE

## 2014-04-09 LAB — SALICYLATE LEVEL: Salicylate Lvl: <2 mg/dL — ABNORMAL LOW (ref 2.8–20.0)

## 2014-04-09 LAB — LITHIUM LEVEL: Lithium Lvl: 2.16 mEq/L (ref 0.80–1.40)

## 2014-04-09 LAB — ETHANOL: Alcohol, Ethyl (B): <11 mg/dL (ref 0–11)

## 2014-04-09 LAB — ACETAMINOPHEN LEVEL: Acetaminophen (Tylenol), Serum: <15 ug/mL (ref 10–30)

## 2014-04-09 MED ORDER — ACETAMINOPHEN 650 MG RE SUPP: 650.0000 mg | Freq: Four times a day (QID) | RECTAL | Status: DC | PRN

## 2014-04-09 MED ORDER — CHLORHEXIDINE GLUCONATE 0.12 % MT SOLN
15.0000 mL | Freq: Two times a day (BID) | OROMUCOSAL | Status: DC
  Administered 2014-04-09 – 2014-04-16 (×14): 15 mL via OROMUCOSAL
  Filled 2014-04-09 (×18): qty 15

## 2014-04-09 MED ORDER — DEXTROSE 5 % IV SOLN
1.0000 g | Freq: Once | INTRAVENOUS | Status: AC
  Administered 2014-04-09: 1 g via INTRAVENOUS
  Filled 2014-04-09: qty 10

## 2014-04-09 MED ORDER — METOPROLOL SUCCINATE ER 25 MG PO TB24
25.0000 mg | ORAL_TABLET | Freq: Every morning | ORAL | Status: DC
  Administered 2014-04-09 – 2014-04-23 (×14): 25 mg via ORAL
  Filled 2014-04-09 (×17): qty 1

## 2014-04-09 MED ORDER — CLOPIDOGREL BISULFATE 75 MG PO TABS
75.0000 mg | ORAL_TABLET | Freq: Every morning | ORAL | Status: DC
  Administered 2014-04-09 – 2014-04-23 (×15): 75 mg via ORAL
  Filled 2014-04-09 (×16): qty 1

## 2014-04-09 MED ORDER — DIPHENHYDRAMINE HCL 50 MG/ML IJ SOLN
50.0000 mg | Freq: Once | INTRAMUSCULAR | Status: AC
  Administered 2014-04-09: 50 mg via INTRAMUSCULAR
  Filled 2014-04-09: qty 1

## 2014-04-09 MED ORDER — DEXTROSE 5 % IV SOLN
1.0000 g | INTRAVENOUS | Status: DC
  Administered 2014-04-10 – 2014-04-12 (×3): 1 g via INTRAVENOUS
  Filled 2014-04-09 (×4): qty 10

## 2014-04-09 MED ORDER — HALOPERIDOL LACTATE 5 MG/ML IJ SOLN
2.0000 mg | Freq: Once | INTRAMUSCULAR | Status: AC
  Administered 2014-04-09: 2 mg via INTRAMUSCULAR
  Filled 2014-04-09: qty 1

## 2014-04-09 MED ORDER — SODIUM CHLORIDE 0.9 % IV BOLUS (SEPSIS)
1000.0000 mL | Freq: Once | INTRAVENOUS | Status: AC
  Administered 2014-04-09: 1000 mL via INTRAVENOUS

## 2014-04-09 MED ORDER — ONDANSETRON HCL 4 MG PO TABS: 4.0000 mg | ORAL_TABLET | Freq: Four times a day (QID) | ORAL | Status: DC | PRN

## 2014-04-09 MED ORDER — FLUOXETINE HCL 10 MG PO CAPS
10.0000 mg | ORAL_CAPSULE | Freq: Every morning | ORAL | Status: DC
  Administered 2014-04-09 – 2014-04-13 (×5): 10 mg via ORAL
  Filled 2014-04-09 (×8): qty 1

## 2014-04-09 MED ORDER — SIMVASTATIN 10 MG PO TABS
10.0000 mg | ORAL_TABLET | Freq: Every day | ORAL | Status: DC
  Administered 2014-04-09 – 2014-04-22 (×13): 10 mg via ORAL
  Filled 2014-04-09 (×18): qty 1

## 2014-04-09 MED ORDER — ACETAMINOPHEN 325 MG PO TABS: 650.0000 mg | ORAL_TABLET | Freq: Four times a day (QID) | ORAL | Status: DC | PRN

## 2014-04-09 MED ORDER — LITHIUM CARBONATE 150 MG PO CAPS: 150.0000 mg | ORAL_CAPSULE | Freq: Two times a day (BID) | ORAL | Status: DC

## 2014-04-09 MED ORDER — ALPRAZOLAM 0.5 MG PO TABS
1.0000 mg | ORAL_TABLET | Freq: Three times a day (TID) | ORAL | Status: DC
  Administered 2014-04-09 – 2014-04-23 (×39): 1 mg via ORAL
  Filled 2014-04-09 (×43): qty 2

## 2014-04-09 MED ORDER — PNEUMOCOCCAL VAC POLYVALENT 25 MCG/0.5ML IJ INJ
0.5000 mL | INJECTION | INTRAMUSCULAR | Status: DC
  Filled 2014-04-09: qty 0.5

## 2014-04-09 MED ORDER — ONDANSETRON HCL 4 MG/2ML IJ SOLN: 4.0000 mg | Freq: Four times a day (QID) | INTRAMUSCULAR | Status: DC | PRN

## 2014-04-09 MED ORDER — LORAZEPAM 2 MG/ML IJ SOLN
1.0000 mg | Freq: Once | INTRAMUSCULAR | Status: AC
  Administered 2014-04-09: 1 mg via INTRAMUSCULAR
  Filled 2014-04-09: qty 1

## 2014-04-09 MED ORDER — DEXTROSE 5 % IV SOLN
500.0000 mg | Freq: Once | INTRAVENOUS | Status: AC
  Administered 2014-04-09: 500 mg via INTRAVENOUS
  Filled 2014-04-09: qty 500

## 2014-04-09 MED ORDER — SODIUM CHLORIDE 0.9 % IV SOLN: INTRAVENOUS | Status: DC

## 2014-04-09 MED ORDER — HALOPERIDOL LACTATE 5 MG/ML IJ SOLN
5.0000 mg | Freq: Four times a day (QID) | INTRAMUSCULAR | Status: DC | PRN
  Administered 2014-04-09 – 2014-04-23 (×12): 5 mg via INTRAVENOUS
  Filled 2014-04-09 (×16): qty 1

## 2014-04-09 MED ORDER — SODIUM CHLORIDE 0.9 % IV SOLN
INTRAVENOUS | Status: DC
  Administered 2014-04-09 (×2): via INTRAVENOUS

## 2014-04-09 MED ORDER — HEPARIN SODIUM (PORCINE) 5000 UNIT/ML IJ SOLN
5000.0000 [IU] | Freq: Three times a day (TID) | INTRAMUSCULAR | Status: DC
  Administered 2014-04-09 – 2014-04-22 (×37): 5000 [IU] via SUBCUTANEOUS
  Filled 2014-04-09 (×45): qty 1

## 2014-04-09 MED ORDER — LORAZEPAM 2 MG/ML IJ SOLN
2.0000 mg | Freq: Once | INTRAMUSCULAR | Status: AC
  Administered 2014-04-09: 2 mg via INTRAVENOUS
  Filled 2014-04-09: qty 1

## 2014-04-09 MED ORDER — TRAZODONE HCL 100 MG PO TABS
100.0000 mg | ORAL_TABLET | Freq: Every day | ORAL | Status: DC
  Administered 2014-04-09 – 2014-04-22 (×13): 100 mg via ORAL
  Filled 2014-04-09 (×15): qty 1

## 2014-04-09 MED ORDER — SODIUM CHLORIDE 0.9 % IJ SOLN
3.0000 mL | Freq: Two times a day (BID) | INTRAMUSCULAR | Status: DC
  Administered 2014-04-09 – 2014-04-23 (×12): 3 mL via INTRAVENOUS

## 2014-04-09 MED ORDER — AZITHROMYCIN 250 MG PO TABS: 500.0000 mg | ORAL_TABLET | ORAL | Status: DC

## 2014-04-09 MED ORDER — LISINOPRIL 5 MG PO TABS
5.0000 mg | ORAL_TABLET | Freq: Every morning | ORAL | Status: DC
  Administered 2014-04-09: 5 mg via ORAL
  Filled 2014-04-09 (×3): qty 1

## 2014-04-09 MED ORDER — LORAZEPAM 2 MG/ML IJ SOLN
1.0000 mg | Freq: Four times a day (QID) | INTRAMUSCULAR | Status: DC | PRN
  Administered 2014-04-09 – 2014-04-23 (×10): 1 mg via INTRAVENOUS
  Filled 2014-04-09 (×11): qty 1

## 2014-04-09 MED ORDER — AZITHROMYCIN 500 MG PO TABS
500.0000 mg | ORAL_TABLET | ORAL | Status: DC
  Administered 2014-04-10 – 2014-04-13 (×4): 500 mg via ORAL
  Filled 2014-04-09 (×4): qty 1

## 2014-04-09 MED ORDER — TAMSULOSIN HCL 0.4 MG PO CAPS
0.4000 mg | ORAL_CAPSULE | Freq: Every morning | ORAL | Status: DC
  Administered 2014-04-10 – 2014-04-23 (×14): 0.4 mg via ORAL
  Filled 2014-04-09 (×14): qty 1

## 2014-04-09 MED ORDER — DEXTROSE 5 % IV SOLN: 1.0000 g | INTRAVENOUS | Status: DC

## 2014-04-09 MED ORDER — BIOTENE DRY MOUTH MT LIQD
15.0000 mL | Freq: Two times a day (BID) | OROMUCOSAL | Status: DC
  Administered 2014-04-10 – 2014-04-16 (×14): 15 mL via OROMUCOSAL

## 2014-04-09 NOTE — H&P (Signed)
[facility] History and Physical  Jeffrey Herring [id number] DOB: Jan 30, 1951 DOA: 04/09/2014  Referring physician: Emergency Department PCP: Jeffrey Norris, MD  Specialists:   Chief Complaint: Confusion  HPI: Jeffrey Herring is a 63 y.o. male  With a hx of bipolar disorder, DI, hx pacemaker placement who presents to the ED with increased confusion and agitation over the past week prior to admi. In the ED, the patient was found to have a low grade temp of 100F with CXR findings suggestive of R base infiltrate with WBC of over 12K and Cr of 3.12 (previous of over 2). The patient was started on empiric azithromycin and rocephin and the hospitalist service consulted for consideration for admission.  Review of Systems:  Per above, the remainder of the 10pt ros reviewed and are neg  Past Medical History  Diagnosis Date  . Hyperlipemia   . Pacemaker   . Arrhythmia   . Diabetes insipidus   . History of kidney stones   . History of DVT (deep vein thrombosis)   . History of pulmonary embolism    Past Surgical History  Procedure Laterality Date  . Cardiac surgery    . Spine surgery    . Nose surgery    . Pacemaker placement    . Hernia repair Right 02/05/14   Social History:  reports that he has been smoking Cigars.  He has never used smokeless tobacco. He reports that he does not drink alcohol or use illicit drugs.  where does patient live--home, ALF, SNF? and with whom if at home?  Can patient participate in ADLs?  Allergies  Allergen Reactions  . Aspirin Hives  . Codeine Nausea And Vomiting  . Demerol [Meperidine] Nausea And Vomiting    Family History  Problem Relation Age of Onset  . Depression Mother     (be sure to complete)  Prior to Admission medications   Medication Sig Start Date End Date Taking? Authorizing Provider  ALPRAZolam [name]) 1 MG tablet Take 1 mg by mouth 3 (three) times daily.   Yes Historical Provider, MD  clopidogrel (PLAVIX) 75 MG tablet  Take 75 mg by mouth every morning.    Yes Historical Provider, MD  FLUoxetine (PROZAC) 10 MG capsule Take 10 mg by mouth every morning.   Yes Historical Provider, MD  lisinopril (PRINIVIL,ZESTRIL) 5 MG tablet Take 5 mg by mouth every morning.    Yes Historical Provider, MD  lithium carbonate 150 MG capsule Take 150 mg by mouth 2 (two) times daily with a meal.   Yes Historical Provider, MD  metoprolol succinate (TOPROL-XL) 25 MG 24 hr tablet Take 25 mg by mouth every morning.    Yes Historical Provider, MD  simvastatin (ZOCOR) 10 MG tablet Take 10 mg by mouth at bedtime.   Yes Historical Provider, MD  tamsulosin (FLOMAX) 0.4 MG CAPS Take 0.4 mg by mouth every morning.    Yes Historical Provider, MD  traZODone (DESYREL) 100 MG tablet Take 100 mg by mouth at bedtime.   Yes Historical Provider, MD   Physical Exam: Filed Vitals:   04/09/14 1134 04/09/14 1307  BP: 154/118 132/70  Pulse: 78 96  Temp: 100 F (37.8 C)   TempSrc: Oral   Resp: 16 20  SpO2: 98% 96%     General:  Awake, in nad  Eyes: PERRL B  ENT: membranes moist, dentition fair  Neck: trachea midline, neck supple  Cardiovascular: regular, s1, s2  Respiratory: normal resp effort, on wheezing  Abdomen: soft,  nondistended  Skin: normal skin turgor, no abnormal skin lesions seen  Musculoskeletal: perfused, no clubbing  Psychiatric: confused, not following commands  Neurologic: cn2-12 grossly int  Labs on Admission:  Basic Metabolic Panel:  Recent Labs Lab 04/09/14 1204  NA 135*  K 4.6  CL 104  CO2 17*  GLUCOSE 125*  BUN 35*  CREATININE 3.12*  CALCIUM 11.1*   Liver Function Tests:  Recent Labs Lab 04/09/14 1204  AST 15  ALT 10  ALKPHOS 102  BILITOT 0.7  PROT 8.1  ALBUMIN 4.9   No results found for this basename: LIPASE, AMYLASE,  in the last 168 hours No results found for this basename: AMMONIA,  in the last 168 hours CBC:  Recent Labs Lab 04/09/14 1204  WBC 12.3*  NEUTROABS 9.8*  HGB  11.3*  HCT 34.0*  MCV 91.4  PLT 203   Cardiac Enzymes: No results found for this basename: CKTOTAL, CKMB, CKMBINDEX, TROPONINI,  in the last 168 hours  BNP (last 3 results) No results found for this basename: PROBNP,  in the last 8760 hours CBG:  Recent Labs Lab 04/09/14 1211  GLUCAP 134*    Radiological Exams on Admission: Dg Chest 2 View  04/09/2014   CLINICAL DATA:  Fever.  EXAM: CHEST  2 VIEW  COMPARISON:  Chest x-ray 01/02/2004.  FINDINGS: Mediastinum and hilar structures are normal. Stable borderline cardiomegaly. Prior CABG. Pacer leads are noted over the chest. Previously identified pacemaker has been removed. Right base atelectasis and/or infiltrate. No significant pleural effusion. No pneumothorax. No acute bony abnormality.  IMPRESSION: 1. Right base atelectasis and/or infiltrate. Follow-up chest x-ray to demonstrate clearing suggested. 2. Prior CABG.  Borderline cardiomegaly, no CHF.   Electronically Signed   By: Marcello Moores  Register   On: 04/09/2014 12:52    Assessment/Plan Principal Problem:   Bipolar 1 disorder Active Problems:   Inguinal hernia   Cardiac pacemaker in situ   Acute renal failure   Leukocytosis   1. Mental status change 1. Considerations include worsening bipolar vs toxic-metabolic encephalopathy from possible CAP 2. Consider Psych consult 3. Abx for pna per below 4. Admit to med-tele 2. Suspected CAP 1. Cont on azithromycin and rocephin that was started in the ED 2. On min O2 support 3. ARF 1. Cont on IVF and monitor lytes/renal function closely 2. Avoid nephrotoxic agents 4. DVT prophylaxis 1. Heparin subq  UPDATE: Lithium levels now elevated to over 2.16 in the setting of worsening renal function. Nephrology will be consulted and in the meantime, have coordinated with ED a transfer to [facility] for further work up.  Code Status: Full Family Communication: Wife at bedside Disposition Plan: Pending  Time spent: 69min  [facility] Pager [phone]  If 7PM-7AM, please contact night-coverage [email or web address] Password [facility] 04/09/2014, 2:07 PM

## 2014-04-09 NOTE — ED Provider Notes (Addendum)
CSN: [id number]     Arrival date & time 04/09/14  1121 History   First MD Initiated Contact with Patient 04/09/14 1145     Chief Complaint  Patient presents with  . AMS      (Consider location/radiation/quality/duration/timing/severity/associated sxs/prior Treatment) The history is provided by the patient and the spouse.  Jeffrey Herring is a 63 y.o. male hx of HL, diabetes insipidus, here with AMS. As per his wife, patient has been more confused. He has been disoriented and has been agitated. He also has been wandering out of the house and has been hearing things that are not there. He also claims that he "wants to be out of his skin" and hasn't been sleeping. He was recently here and Cr has worsened and lithium decreased. Family called Dr. Justine Null, PMD, who sent patient in for r/o UTI vs bipolar.    Past Medical History  Diagnosis Date  . Hyperlipemia   . Pacemaker   . Arrhythmia   . Diabetes insipidus   . History of kidney stones   . History of DVT (deep vein thrombosis)   . History of pulmonary embolism    Past Surgical History  Procedure Laterality Date  . Cardiac surgery    . Spine surgery    . Nose surgery    . Pacemaker placement    . Hernia repair Right 02/05/14   Family History  Problem Relation Age of Onset  . Depression Mother    History  Substance Use Topics  . Smoking status: Current Every Day Smoker -- 0.20 packs/day    Types: Cigars  . Smokeless tobacco: Never Used  . Alcohol Use: No    Review of Systems  Unable to perform ROS: Mental status change      Allergies  Aspirin; Codeine; and Demerol  Home Medications   Prior to Admission medications   Medication Sig Start Date End Date Taking? Authorizing Provider  ALPRAZolam [name]) 1 MG tablet Take 1 mg by mouth 3 (three) times daily.   Yes Historical Provider, MD  clopidogrel (PLAVIX) 75 MG tablet Take 75 mg by mouth every morning.    Yes Historical Provider, MD  FLUoxetine (PROZAC) 10 MG capsule  Take 10 mg by mouth every morning.   Yes Historical Provider, MD  lisinopril (PRINIVIL,ZESTRIL) 5 MG tablet Take 5 mg by mouth every morning.    Yes Historical Provider, MD  lithium carbonate 150 MG capsule Take 150 mg by mouth 2 (two) times daily with a meal.   Yes Historical Provider, MD  metoprolol succinate (TOPROL-XL) 25 MG 24 hr tablet Take 25 mg by mouth every morning.    Yes Historical Provider, MD  simvastatin (ZOCOR) 10 MG tablet Take 10 mg by mouth at bedtime.   Yes Historical Provider, MD  tamsulosin (FLOMAX) 0.4 MG CAPS Take 0.4 mg by mouth every morning.    Yes Historical Provider, MD  traZODone (DESYREL) 100 MG tablet Take 100 mg by mouth at bedtime.   Yes Historical Provider, MD   BP 132/70  Pulse 96  Temp(Src) 100 F (37.8 C) (Oral)  Resp 20  SpO2 96% Physical Exam  Nursing note and vitals reviewed. Constitutional:  Confused, disheveled, agitated   HENT:  Head: Normocephalic and atraumatic.  Mouth/Throat: Oropharynx is clear and moist.  Eyes: Conjunctivae are normal. Pupils are equal, round, and reactive to light.  Neck: Normal range of motion. Neck supple.  Cardiovascular: Normal rate, regular rhythm and normal heart sounds.   Pulmonary/Chest: Effort  normal and breath sounds normal. No respiratory distress. He has no wheezes. He has no rales.  Abdominal: Soft. Bowel sounds are normal. He exhibits no distension. There is no tenderness. There is no rebound and no guarding.  Musculoskeletal: Normal range of motion. He exhibits no edema and no tenderness.  Neurological:  Confused, moving all extremities   Skin: Skin is warm and dry.  Psychiatric:  Confused, poor judgement     ED Course  Procedures (including critical care time) Labs Review Labs Reviewed  CBC WITH DIFFERENTIAL - Abnormal; Notable for the following:    WBC 12.3 (*)    RBC 3.72 (*)    Hemoglobin 11.3 (*)    HCT 34.0 (*)    Neutrophils Relative % 79 (*)    Neutro Abs 9.8 (*)    Lymphocytes  Relative 8 (*)    Monocytes Absolute 1.4 (*)    All other components within normal limits  COMPREHENSIVE METABOLIC PANEL - Abnormal; Notable for the following:    Sodium 135 (*)    CO2 17 (*)    Glucose, Bld 125 (*)    BUN 35 (*)    Creatinine, Ser 3.12 (*)    Calcium 11.1 (*)    GFR calc non Af Amer 20 (*)    GFR calc Af Amer 23 (*)    All other components within normal limits  URINALYSIS, ROUTINE W REFLEX MICROSCOPIC - Abnormal; Notable for the following:    Leukocytes, UA SMALL (*)    All other components within normal limits  CBG MONITORING, ED - Abnormal; Notable for the following:    Glucose-Capillary 134 (*)    All other components within normal limits  URINE CULTURE  URINE MICROSCOPIC-ADD ON  LITHIUM LEVEL  ETHANOL  SALICYLATE LEVEL  ACETAMINOPHEN LEVEL  I-STAT TROPOININ, ED  I-STAT CG4 LACTIC ACID, ED    Imaging Review Dg Chest 2 View  04/09/2014   CLINICAL DATA:  Fever.  EXAM: CHEST  2 VIEW  COMPARISON:  Chest x-ray 01/02/2004.  FINDINGS: Mediastinum and hilar structures are normal. Stable borderline cardiomegaly. Prior CABG. Pacer leads are noted over the chest. Previously identified pacemaker has been removed. Right base atelectasis and/or infiltrate. No significant pleural effusion. No pneumothorax. No acute bony abnormality.  IMPRESSION: 1. Right base atelectasis and/or infiltrate. Follow-up chest x-ray to demonstrate clearing suggested. 2. Prior CABG.  Borderline cardiomegaly, no CHF.   Electronically Signed   By: Marcello Moores  Register   On: 04/09/2014 12:52     EKG Interpretation   Date/Time:  Thursday April 09 2014 13:06:12 EDT Ventricular Rate:  102 PR Interval:  147 QRS Duration: 196 QT Interval:  492 QTC Calculation: 641 R Axis:   0 Text Interpretation:  A-V dual-paced rhythm with some inhibition No  further analysis attempted due to paced rhythm Since last tracing rate  faster Confirmed by YAO  MD, DAVID ([id number]) on 04/09/2014 1:14:25 PM      MDM    Final diagnoses:  None    Jeffrey Herring is a 63 y.o. male here with agitation, AMS. Likely bipolar. Low grade temp so will need to r/o sepsis. On lithium so will check level. Will IVC and likely need psych consult.   2:08 PM Patient IVC. WBC 12. CXR showed possible infiltrate. Given abx. Cr 3.1, worsened compared to before. Bicarb 17, likely from dehydration. Will admit to med/surg for pneumonia, AMS, dehydration, renal failure.   3:17 PM Lithium level came out to be 2.1. Likely from  acute renal failure. Since its not an acute overdose, will not give any meds. I consulted nephrology. Will transfer to [facility] for nephrology consult.   Wandra Arthurs, MD 04/09/14 Joiner Yao, MD 04/09/14 [phone]

## 2014-04-09 NOTE — ED Notes (Signed)
Pt repositioned in bed. Pt awakens with repositioning in bed and denies pain at present time.

## 2014-04-09 NOTE — Telephone Encounter (Signed)
I received a call from patient's wife who is concerned the patient is very delusional, confused and agitated.  I talked with the patient's wife.  She is concerned that her past few days the patient is not to his baseline.  Patient is going to the garbage can, more irritable, confused and not eating very well.  By this very concerned about his behavior.  I recommended to bring patient to [place] room for assessment and medical clearance.  I also spoke to psychiatric emergency room staff that after medically cleared and patient remains very psychotic that he required inpatient psychiatric treatment.

## 2014-04-09 NOTE — ED Notes (Signed)
Tammy from lab reports Lithium of 2.16.

## 2014-04-09 NOTE — ED Notes (Signed)
Wyline Copas on phone with Darl Householder MD.

## 2014-04-09 NOTE — Progress Notes (Signed)
Jeffrey Herring [id number] Code Status: Full   Admission Data: 04/09/2014 5:56 PM Attending Provider:  Hartford Poli [id number], MD Consults/ Treatment Team:    Jeffrey Herring is a 63 y.o. male patient admitted from ED awake, alert - oriented  X 3 - no acute distress noted.  VSS - Blood pressure 100/52, pulse 62, temperature 97.6 F (36.4 C), temperature source Oral, resp. rate 18, SpO2 95.00%.    IV in place, occlusive dsg intact without redness.   SR up x 2, fall assessment complete, Call light within reach, patient able to voice, and demonstrate understanding.  Skin, clean-dry- intact without evidence of bruising, or skin tears.   No evidence of skin break down noted on exam.     Will cont to eval and treat per MD orders.  Henriette Combs, [place] 04/09/2014 5:56 PM

## 2014-04-09 NOTE — ED Notes (Signed)
Per pt's family, states patient has been altered for about 3 weeks-was told to come her by Dr Justine Null to be checked out for possible UTI

## 2014-04-09 NOTE — Consult Note (Signed)
[facility] Consult   Reason for Consult:  Lethargic, disorientation  Referring Physician:  EDP RYIN AMBROSIUS is an 63 y.o. male. Total Time spent with patient: 25 minutes  Assessment: AXIS I:  Psychotic Disorder NOS AXIS II:  Deferred AXIS III:   Past Medical History  Diagnosis Date  . Hyperlipemia   . Pacemaker   . Arrhythmia   . Diabetes insipidus   . History of kidney stones   . History of DVT (deep vein thrombosis)   . History of pulmonary embolism    AXIS IV:  other psychosocial or environmental problems and problems related to social environment AXIS V:  11-20 some danger of hurting self or others possible OR occasionally fails to maintain minimal personal hygiene OR gross impairment in communication  Plan:  Recommend psychiatric Inpatient admission when medically cleared.   Subjective:   Jeffrey Herring is a 63 y.o. male patient admitted with severe confusion. Pt has been medically cleared in ED. Mr. Zufall is a pt of Dr. Adele Schilder who is tapering pt off lithium due to renal impairment. Pt is lethargic and will not respond to questions during this assessment. Per nurse report and medical chart, pt just received Ativan for agitation which is likely the cause for this sedation. Pt will be admitted to [facility] inpatient if possible as we have a therapeutic relationship with this patient already.   HPI:  With a hx of bipolar disorder, DI, hx pacemaker placement who presents to the ED with increased confusion and agitation over the past week prior to admi. In the ED, the patient was found to have a low grade temp of 100F with CXR findings suggestive of R base infiltrate with WBC of over 12K and Cr of 3.12 (previous of over 2). The patient was started on empiric azithromycin and rocephin and the hospitalist service consulted for consideration for admission.   HPI Elements:   Location:  Generalized, inpatient. Quality:  Worsening. Severity:  Severe. Timing:   Transient. Duration:  Transient. Context:  Unknown etiology, yet possibly related to lithium adjustments.  Past Psychiatric History: Past Medical History  Diagnosis Date  . Hyperlipemia   . Pacemaker   . Arrhythmia   . Diabetes insipidus   . History of kidney stones   . History of DVT (deep vein thrombosis)   . History of pulmonary embolism     reports that he has been smoking Cigars.  He has never used smokeless tobacco. He reports that he does not drink alcohol or use illicit drugs. Family History  Problem Relation Age of Onset  . Depression Mother      Living Arrangements: Spouse/significant other   Abuse/Neglect [facility]) Physical Abuse: Denies Verbal Abuse: Denies Sexual Abuse: Denies Allergies:   Allergies  Allergen Reactions  . Aspirin Hives  . Codeine Nausea And Vomiting  . Demerol [Meperidine] Nausea And Vomiting    ACT Assessment Complete:  Yes:    Educational Status    Risk to Self: Risk to self Is patient at risk for suicide?: No Substance abuse history and/or treatment for substance abuse?: No  Risk to Others:    Abuse: Abuse/Neglect Assessment (Assessment to be complete while patient is alone) Physical Abuse: Denies Verbal Abuse: Denies Sexual Abuse: Denies Exploitation of patient/patient's resources: Denies Self-Neglect: Denies  Prior Inpatient Therapy:    Prior Outpatient Therapy:    Additional Information:      Objective: Blood pressure 100/52, pulse 62, temperature 97.6 F (36.4 C), temperature source Oral,  resp. rate 18, SpO2 95.00%.There is no weight on file to calculate BMI. Results for orders placed during the hospital encounter of 04/09/14 (from the past 72 hour(s))  CBC WITH DIFFERENTIAL     Status: Abnormal   Collection Time    04/09/14 12:04 PM      Result Value Ref Range   WBC 12.3 (*) 4.0 - 10.5 K/uL   RBC 3.72 (*) 4.22 - 5.81 MIL/uL   Hemoglobin 11.3 (*) 13.0 - 17.0 g/dL   HCT 34.0 (*) 39.0 - 52.0 %   MCV 91.4  78.0 - 100.0 fL    MCH 30.4  26.0 - 34.0 pg   MCHC 33.2  30.0 - 36.0 g/dL   RDW 13.6  11.5 - 15.5 %   Platelets 203  150 - 400 K/uL   Neutrophils Relative % 79 (*) 43 - 77 %   Neutro Abs 9.8 (*) 1.7 - 7.7 K/uL   Lymphocytes Relative 8 (*) 12 - 46 %   Lymphs Abs 0.9  0.7 - 4.0 K/uL   Monocytes Relative 12  3 - 12 %   Monocytes Absolute 1.4 (*) 0.1 - 1.0 K/uL   Eosinophils Relative 1  0 - 5 %   Eosinophils Absolute 0.2  0.0 - 0.7 K/uL   Basophils Relative 0  0 - 1 %   Basophils Absolute 0.0  0.0 - 0.1 K/uL  COMPREHENSIVE METABOLIC PANEL     Status: Abnormal   Collection Time    04/09/14 12:04 PM      Result Value Ref Range   Sodium 135 (*) 137 - 147 mEq/L   Potassium 4.6  3.7 - 5.3 mEq/L   Chloride 104  96 - 112 mEq/L   CO2 17 (*) 19 - 32 mEq/L   Glucose, Bld 125 (*) 70 - 99 mg/dL   BUN 35 (*) 6 - 23 mg/dL   Creatinine, Ser 3.12 (*) 0.50 - 1.35 mg/dL   Calcium 11.1 (*) 8.4 - 10.5 mg/dL   Total Protein 8.1  6.0 - 8.3 g/dL   Albumin 4.9  3.5 - 5.2 g/dL   AST 15  0 - 37 U/L   ALT 10  0 - 53 U/L   Alkaline Phosphatase 102  39 - 117 U/L   Total Bilirubin 0.7  0.3 - 1.2 mg/dL   GFR calc non Af Amer 20 (*) >90 mL/min   GFR calc Af Amer 23 (*) >90 mL/min   Comment: (NOTE)     The eGFR has been calculated using the CKD EPI equation.     This calculation has not been validated in all clinical situations.     eGFR's persistently <90 mL/min signify possible Chronic Kidney     Disease.  URINALYSIS, ROUTINE W REFLEX MICROSCOPIC     Status: Abnormal   Collection Time    04/09/14 12:09 PM      Result Value Ref Range   Color, Urine YELLOW  YELLOW   APPearance CLEAR  CLEAR   Specific Gravity, Urine 1.007  1.005 - 1.030   pH 6.0  5.0 - 8.0   Glucose, UA NEGATIVE  NEGATIVE mg/dL   Hgb urine dipstick NEGATIVE  NEGATIVE   Bilirubin Urine NEGATIVE  NEGATIVE   Ketones, ur NEGATIVE  NEGATIVE mg/dL   Protein, ur NEGATIVE  NEGATIVE mg/dL   Urobilinogen, UA 0.2  0.0 - 1.0 mg/dL   Nitrite NEGATIVE  NEGATIVE    Leukocytes, UA SMALL (*) NEGATIVE  URINE MICROSCOPIC-ADD ON  Status: None   Collection Time    04/09/14 12:09 PM      Result Value Ref Range   WBC, UA 3-6  <3 WBC/hpf   Bacteria, UA RARE  RARE  CBG MONITORING, ED     Status: Abnormal   Collection Time    04/09/14 12:11 PM      Result Value Ref Range   Glucose-Capillary 134 (*) 70 - 99 mg/dL  Randolm Idol, ED     Status: None   Collection Time    04/09/14 12:14 PM      Result Value Ref Range   Troponin i, poc 0.02  0.00 - 0.08 ng/mL   Comment 3            Comment: Due to the release kinetics of cTnI,     a negative result within the first hours     of the onset of symptoms does not rule out     myocardial infarction with certainty.     If myocardial infarction is still suspected,     repeat the test at appropriate intervals.  LITHIUM LEVEL     Status: Abnormal   Collection Time    04/09/14  1:27 PM      Result Value Ref Range   Lithium Lvl 2.16 (*) 0.80 - 1.40 mEq/L   Comment: CRITICAL RESULT CALLED TO, READ BACK BY AND VERIFIED WITH:     [facility] RN AT 1749 04/09/14 BARFIELD,T  ETHANOL     Status: None   Collection Time    04/09/14  1:27 PM      Result Value Ref Range   Alcohol, Ethyl (B) <11  0 - 11 mg/dL   Comment:            LOWEST DETECTABLE LIMIT FOR     SERUM ALCOHOL IS 11 mg/dL     FOR MEDICAL PURPOSES ONLY  SALICYLATE LEVEL     Status: Abnormal   Collection Time    04/09/14  1:27 PM      Result Value Ref Range   Salicylate Lvl <4.4 (*) 2.8 - 20.0 mg/dL  ACETAMINOPHEN LEVEL     Status: None   Collection Time    04/09/14  1:27 PM      Result Value Ref Range   Acetaminophen (Tylenol), Serum <15.0  10 - 30 ug/mL   Comment:            THERAPEUTIC CONCENTRATIONS VARY     SIGNIFICANTLY. A RANGE OF 10-30     ug/mL MAY BE AN EFFECTIVE     CONCENTRATION FOR MANY PATIENTS.     HOWEVER, SOME ARE BEST TREATED     AT CONCENTRATIONS OUTSIDE THIS     RANGE.     ACETAMINOPHEN CONCENTRATIONS     >150  ug/mL AT 4 HOURS AFTER     INGESTION AND >50 ug/mL AT 12     HOURS AFTER INGESTION ARE     OFTEN ASSOCIATED WITH TOXIC     REACTIONS.  I-STAT CG4 LACTIC ACID, ED     Status: None   Collection Time    04/09/14  1:40 PM      Result Value Ref Range   Lactic Acid, Venous 1.55  0.5 - 2.2 mmol/L   Labs are reviewed and are pertinent for Lithium 2.16, Cr 3.12, BUN 35,   Current Facility-Administered Medications  Medication Dose Route Frequency Provider Last Rate Last Dose  . 0.9 %  sodium chloride infusion  Intravenous Continuous Donne Hazel, MD 75 mL/hr at 04/09/14 1700    . acetaminophen (TYLENOL) tablet 650 mg  650 mg Oral Q6H PRN Donne Hazel, MD       Or  . acetaminophen (TYLENOL) suppository 650 mg  650 mg Rectal Q6H PRN Donne Hazel, MD      . ALPRAZolam [name]) tablet 1 mg  1 mg Oral TID Donne Hazel, MD      . Derrill Memo ON 04/10/2014] azithromycin (ZITHROMAX) tablet 500 mg  500 mg Oral Q24H Donne Hazel, MD      . Derrill Memo ON 04/10/2014] cefTRIAXone (ROCEPHIN) 1 g in dextrose 5 % 50 mL IVPB  1 g Intravenous Q24H Donne Hazel, MD      . clopidogrel (PLAVIX) tablet 75 mg  75 mg Oral q morning - 10a Donne Hazel, MD      . FLUoxetine (PROZAC) capsule 10 mg  10 mg Oral q morning - 10a Donne Hazel, MD      . haloperidol lactate (HALDOL) injection 5 mg  5 mg Intravenous Q6H PRN Donne Hazel, MD      . heparin injection 5,000 Units  5,000 Units Subcutaneous 3 times per day Donne Hazel, MD      . lisinopril (PRINIVIL,ZESTRIL) tablet 5 mg  5 mg Oral q morning - 10a Donne Hazel, MD      . LORazepam (ATIVAN) injection 1 mg  1 mg Intravenous Q6H PRN Verlee Monte, MD   1 mg at 04/09/14 1813  . metoprolol succinate (TOPROL-XL) 24 hr tablet 25 mg  25 mg Oral q morning - 10a Donne Hazel, MD      . ondansetron [facility]) tablet 4 mg  4 mg Oral Q6H PRN Donne Hazel, MD       Or  . ondansetron [facility]) injection 4 mg  4 mg Intravenous Q6H PRN Donne Hazel, MD      . Derrill Memo ON  04/10/2014] pneumococcal 23 valent vaccine (PNU-IMMUNE) injection 0.5 mL  0.5 mL Intramuscular Tomorrow-1000 Verlee Monte, MD      . simvastatin (ZOCOR) tablet 10 mg  10 mg Oral QHS Donne Hazel, MD      . sodium chloride 0.9 % injection 3 mL  3 mL Intravenous Q12H Donne Hazel, MD      . tamsulosin [facility]) capsule 0.4 mg  0.4 mg Oral q morning - 10a Donne Hazel, MD      . traZODone (DESYREL) tablet 100 mg  100 mg Oral QHS Donne Hazel, MD        Psychiatric Specialty Exam:     Blood pressure 100/52, pulse 62, temperature 97.6 F (36.4 C), temperature source Oral, resp. rate 18, SpO2 95.00%.There is no weight on file to calculate BMI.  General Appearance: Casual  Eye Contact::  Good  Speech:  Clear and Coherent  Volume:  Normal  Mood:  Anxious and Depressed  Affect:  Appropriate  Thought Process:  Coherent and Goal Directed  Orientation:  Full (Time, Place, and Person)  Thought Content:  WDL  Suicidal Thoughts:  No  Homicidal Thoughts:  No  Memory:  Immediate;   Good Recent;   Good Remote;   Good  Judgement:  Fair  Insight:  Fair  Psychomotor Activity:  Normal  Concentration:  Fair  Recall:  [place] of Knowledge:Fair  Language: Fair  Akathisia:  No  Handed:    AIMS (if indicated):  Assets:  Desire for Improvement Resilience  Sleep:      Musculoskeletal: Strength & Muscle Tone: within normal limits Gait & Station: normal Patient leans: N/A  Treatment Plan Summary:  Daily contact with patient to assess and evaluate symptoms and progress in treatment Medication management -Admit to [facility] inpatient as we have a therapeutic relationship with this patient.   Benjamine Mola, FNP-BC 04/09/2014 6:29 PM

## 2014-04-09 NOTE — ED Notes (Signed)
CT reports unable to perform CT of head due to pt movement. Darl Householder MD aware.

## 2014-04-09 NOTE — ED Notes (Signed)
EMS called to transport pt to limit transport delay.

## 2014-04-09 NOTE — ED Notes (Signed)
A Nease attempt IV unsuccessful. Infiltration/brusing noted to site of right hand.

## 2014-04-09 NOTE — Consult Note (Signed)
HPI: I was asked by Dr. Wyline Copas to see Jeffrey Herring who is a 63 y.o. male admitted today with altered mental status and fever to 100 with ?RLL infx.n He has a history of Stage 3 CKD with a serum creatinine of 2.02 on 03/06/13 and 2.2 on 09/01/13. He has been taking Lithium for years followed by Dr. Adele Schilder.  I do not have any lithium level results.  Laboratory studies upon presentation today include a BUN of 35 and creat of 3.2. Lithium level was elevated at 2.16.  Calcium was 11.1. He has a past history of diabetes insipidus. UA reveals spg of 1.007 and no protein w/ 3-6 WBCs. Renal consultation was requested.  Past Medical History  Diagnosis Date  . Hyperlipemia   . Pacemaker   . Arrhythmia   . Diabetes insipidus   . History of kidney stones   . History of DVT (deep vein thrombosis)   . History of pulmonary embolism    Past Surgical History  Procedure Laterality Date  . Cardiac surgery    . Spine surgery    . Nose surgery    . Pacemaker placement    . Hernia repair Right 02/05/14   Social History:  reports that he has been smoking Cigars.  He has never used smokeless tobacco. He reports that he does not drink alcohol or use illicit drugs. Allergies:  Allergies  Allergen Reactions  . Aspirin Hives  . Codeine Nausea And Vomiting  . Demerol [Meperidine] Nausea And Vomiting   Family History  Problem Relation Age of Onset  . Depression Mother     Medications:  Prior to Admission:  Prescriptions prior to admission  Medication Sig Dispense Refill  . ALPRAZolam (XANAX) 1 MG tablet Take 1 mg by mouth 3 (three) times daily.      . clopidogrel (PLAVIX) 75 MG tablet Take 75 mg by mouth every morning.       Marland Kitchen FLUoxetine (PROZAC) 10 MG capsule Take 10 mg by mouth every morning.      Marland Kitchen lisinopril (PRINIVIL,ZESTRIL) 5 MG tablet Take 5 mg by mouth every morning.       . lithium carbonate 150 MG capsule Take 150 mg by mouth 2 (two) times daily with a meal.      . metoprolol succinate  (TOPROL-XL) 25 MG 24 hr tablet Take 25 mg by mouth every morning.       . simvastatin (ZOCOR) 10 MG tablet Take 10 mg by mouth at bedtime.      . tamsulosin (FLOMAX) 0.4 MG CAPS Take 0.4 mg by mouth every morning.       . traZODone (DESYREL) 100 MG tablet Take 100 mg by mouth at bedtime.       Scheduled: . ALPRAZolam  1 mg Oral TID  . [START ON 04/10/2014] antiseptic oral rinse  15 mL Mouth Rinse q12n4p  . [START ON 04/10/2014] azithromycin  500 mg Oral Q24H  . [START ON 04/10/2014] cefTRIAXone (ROCEPHIN)  IV  1 g Intravenous Q24H  . chlorhexidine  15 mL Mouth Rinse BID  . clopidogrel  75 mg Oral q morning - 10a  . FLUoxetine  10 mg Oral q morning - 10a  . heparin  5,000 Units Subcutaneous 3 times per day  . lisinopril  5 mg Oral q morning - 10a  . metoprolol succinate  25 mg Oral q morning - 10a  . [START ON 04/10/2014] pneumococcal 23 valent vaccine  0.5 mL Intramuscular Tomorrow-1000  . simvastatin  10 mg Oral QHS  . sodium chloride  3 mL Intravenous Q12H  . tamsulosin  0.4 mg Oral q morning - 10a  . traZODone  100 mg Oral QHS    ROS: not ascertainable   Blood pressure 164/91, pulse 84, temperature 98.5 F (36.9 C), temperature source Axillary, resp. rate 18, height '5\' 10"'  (1.778 m), weight 68.221 kg (150 lb 6.4 oz), SpO2 98.00%.  General appearance: moderate distress, uncooperative and agitated Head: Normocephalic, without obvious abnormality, atraumatic Resp: clear to auscultation bilaterally Chest wall: no tenderness Cardio: regular rate and rhythm, S1, S2 normal, no murmur, click, rub or gallop GI: soft, non-tender; bowel sounds normal; no masses,  no organomegaly Extremities: extremities normal, atraumatic, no cyanosis or edema Skin: Skin color, texture, turgor normal. No rashes or lesions Neurologic: severe agitation and uncooperative Results for orders placed during the hospital encounter of 04/09/14 (from the past 48 hour(s))  CBC WITH DIFFERENTIAL     Status: Abnormal    Collection Time    04/09/14 12:04 PM      Result Value Ref Range   WBC 12.3 (*) 4.0 - 10.5 K/uL   RBC 3.72 (*) 4.22 - 5.81 MIL/uL   Hemoglobin 11.3 (*) 13.0 - 17.0 g/dL   HCT 34.0 (*) 39.0 - 52.0 %   MCV 91.4  78.0 - 100.0 fL   MCH 30.4  26.0 - 34.0 pg   MCHC 33.2  30.0 - 36.0 g/dL   RDW 13.6  11.5 - 15.5 %   Platelets 203  150 - 400 K/uL   Neutrophils Relative % 79 (*) 43 - 77 %   Neutro Abs 9.8 (*) 1.7 - 7.7 K/uL   Lymphocytes Relative 8 (*) 12 - 46 %   Lymphs Abs 0.9  0.7 - 4.0 K/uL   Monocytes Relative 12  3 - 12 %   Monocytes Absolute 1.4 (*) 0.1 - 1.0 K/uL   Eosinophils Relative 1  0 - 5 %   Eosinophils Absolute 0.2  0.0 - 0.7 K/uL   Basophils Relative 0  0 - 1 %   Basophils Absolute 0.0  0.0 - 0.1 K/uL  COMPREHENSIVE METABOLIC PANEL     Status: Abnormal   Collection Time    04/09/14 12:04 PM      Result Value Ref Range   Sodium 135 (*) 137 - 147 mEq/L   Potassium 4.6  3.7 - 5.3 mEq/L   Chloride 104  96 - 112 mEq/L   CO2 17 (*) 19 - 32 mEq/L   Glucose, Bld 125 (*) 70 - 99 mg/dL   BUN 35 (*) 6 - 23 mg/dL   Creatinine, Ser 3.12 (*) 0.50 - 1.35 mg/dL   Calcium 11.1 (*) 8.4 - 10.5 mg/dL   Total Protein 8.1  6.0 - 8.3 g/dL   Albumin 4.9  3.5 - 5.2 g/dL   AST 15  0 - 37 U/L   ALT 10  0 - 53 U/L   Alkaline Phosphatase 102  39 - 117 U/L   Total Bilirubin 0.7  0.3 - 1.2 mg/dL   GFR calc non Af Amer 20 (*) >90 mL/min   GFR calc Af Amer 23 (*) >90 mL/min   Comment: (NOTE)     The eGFR has been calculated using the CKD EPI equation.     This calculation has not been validated in all clinical situations.     eGFR's persistently <90 mL/min signify possible Chronic Kidney     Disease.  URINALYSIS,  ROUTINE W REFLEX MICROSCOPIC     Status: Abnormal   Collection Time    04/09/14 12:09 PM      Result Value Ref Range   Color, Urine YELLOW  YELLOW   APPearance CLEAR  CLEAR   Specific Gravity, Urine 1.007  1.005 - 1.030   pH 6.0  5.0 - 8.0   Glucose, UA NEGATIVE  NEGATIVE mg/dL    Hgb urine dipstick NEGATIVE  NEGATIVE   Bilirubin Urine NEGATIVE  NEGATIVE   Ketones, ur NEGATIVE  NEGATIVE mg/dL   Protein, ur NEGATIVE  NEGATIVE mg/dL   Urobilinogen, UA 0.2  0.0 - 1.0 mg/dL   Nitrite NEGATIVE  NEGATIVE   Leukocytes, UA SMALL (*) NEGATIVE  URINE MICROSCOPIC-ADD ON     Status: None   Collection Time    04/09/14 12:09 PM      Result Value Ref Range   WBC, UA 3-6  <3 WBC/hpf   Bacteria, UA RARE  RARE  CBG MONITORING, ED     Status: Abnormal   Collection Time    04/09/14 12:11 PM      Result Value Ref Range   Glucose-Capillary 134 (*) 70 - 99 mg/dL  I-STAT TROPOININ, ED     Status: None   Collection Time    04/09/14 12:14 PM      Result Value Ref Range   Troponin i, poc 0.02  0.00 - 0.08 ng/mL   Comment 3            Comment: Due to the release kinetics of cTnI,     a negative result within the first hours     of the onset of symptoms does not rule out     myocardial infarction with certainty.     If myocardial infarction is still suspected,     repeat the test at appropriate intervals.  LITHIUM LEVEL     Status: Abnormal   Collection Time    04/09/14  1:27 PM      Result Value Ref Range   Lithium Lvl 2.16 (*) 0.80 - 1.40 mEq/L   Comment: CRITICAL RESULT CALLED TO, READ BACK BY AND VERIFIED WITH:     [facility] RN AT 2947 04/09/14 BARFIELD,T  ETHANOL     Status: None   Collection Time    04/09/14  1:27 PM      Result Value Ref Range   Alcohol, Ethyl (B) <11  0 - 11 mg/dL   Comment:            LOWEST DETECTABLE LIMIT FOR     SERUM ALCOHOL IS 11 mg/dL     FOR MEDICAL PURPOSES ONLY  SALICYLATE LEVEL     Status: Abnormal   Collection Time    04/09/14  1:27 PM      Result Value Ref Range   Salicylate Lvl <6.5 (*) 2.8 - 20.0 mg/dL  ACETAMINOPHEN LEVEL     Status: None   Collection Time    04/09/14  1:27 PM      Result Value Ref Range   Acetaminophen (Tylenol), Serum <15.0  10 - 30 ug/mL   Comment:            THERAPEUTIC CONCENTRATIONS VARY      SIGNIFICANTLY. A RANGE OF 10-30     ug/mL MAY BE AN EFFECTIVE     CONCENTRATION FOR MANY PATIENTS.     HOWEVER, SOME ARE BEST TREATED     AT CONCENTRATIONS OUTSIDE THIS  RANGE.     ACETAMINOPHEN CONCENTRATIONS     >150 ug/mL AT 4 HOURS AFTER     INGESTION AND >50 ug/mL AT 12     HOURS AFTER INGESTION ARE     OFTEN ASSOCIATED WITH TOXIC     REACTIONS.  I-STAT CG4 LACTIC ACID, ED     Status: None   Collection Time    04/09/14  1:40 PM      Result Value Ref Range   Lactic Acid, Venous 1.55  0.5 - 2.2 mmol/L  HIV ANTIBODY (ROUTINE TESTING)     Status: None   Collection Time    04/09/14  3:01 PM      Result Value Ref Range   HIV 1&2 Ab, 4th Generation NONREACTIVE  NONREACTIVE   Comment: (NOTE)     A NONREACTIVE HIV Ag/Ab result does not exclude HIV infection since     the time frame for seroconversion is variable. If acute HIV infection     is suspected, a HIV-1 RNA Qualitative TMA test is recommended.     HIV-1/2 Antibody Diff         Not indicated.     HIV-1 RNA, Qual TMA           Not indicated.     PLEASE NOTE: This information has been disclosed to you from records     whose confidentiality may be protected by state law. If your state     requires such protection, then the state law prohibits you from making     any further disclosure of the information without the specific written     consent of the person to whom it pertains, or as otherwise permitted     by law. A general authorization for the release of medical or other     information is NOT sufficient for this purpose.     The performance of this assay has not been clinically validated in     patients less than 33 years old.     Performed at [facility]   Dg Chest 2 View  04/09/2014   CLINICAL DATA:  Fever.  EXAM: CHEST  2 VIEW  COMPARISON:  Chest x-ray 01/02/2004.  FINDINGS: Mediastinum and hilar structures are normal. Stable borderline cardiomegaly. Prior CABG. Pacer leads are noted over the chest.  Previously identified pacemaker has been removed. Right base atelectasis and/or infiltrate. No significant pleural effusion. No pneumothorax. No acute bony abnormality.  IMPRESSION: 1. Right base atelectasis and/or infiltrate. Follow-up chest x-ray to demonstrate clearing suggested. 2. Prior CABG.  Borderline cardiomegaly, no CHF.   Electronically Signed   By: Marcello Moores  Register   On: 04/09/2014 12:52    Assessment: 1 Chronic kidney disease with an acute component, both possibly due to Lithium 2 Nephrogenic diabetes insipidus, due to Lithium 3 Hypercalcemia due to Lithium 4 Altered mental status of uncertain cause(s) 5 Bipolar  Rec: 1 Stop Lithium forever  2 Hydrate 3 Follow level 4 check PTH level 5 Renal ultrasound 6 Hold Lisinopril  Citlalli Weikel C 04/09/2014, 9:58 PM

## 2014-04-09 NOTE — ED Notes (Addendum)
Per wife pt "is bipolar and delusional." Wife also states pt on Lithium ad Prozac and has not been taking medications prescribed at night time. Pt seen by psychiatrist and referred here to be treated for possible UTI related to confusion and high creatinine . Pt unable to verify date/time. Pt alert. Pt denies pain. Pt denies SI/HI.   Wife/Maryann contact information [phone].

## 2014-04-09 NOTE — ED Notes (Signed)
Pt behavior continues to be agitated. Pt obeys commands but staff have to repeat several times. Pt forgets easily.

## 2014-04-09 NOTE — ED Notes (Signed)
Bednar MD at bedside. Aware of stable vital signs and pt responds to pain. Post assessment as Bednar exits room pt rises and moans, back to baseline with confusion as presented on arrival. EMS at bedside. Bednar reports pt okay to transfer.

## 2014-04-09 NOTE — ED Notes (Signed)
Pt groans when moved. Bednar MD notified of pt increased lethargic state.

## 2014-04-09 NOTE — ED Notes (Signed)
Yao MD notified of Lithium level. [employer]product [employer] admitting.

## 2014-04-09 NOTE — ED Notes (Signed)
Per [place] Night NT unable to draw ALL labels related to pt agitation. Will redraw remaining labs when pt calms.

## 2014-04-09 NOTE — Progress Notes (Signed)
Pt given ativan 1mg  at 1815 for agitation. Pt condition unchanged. Pt currently with sitter at bedside. At 1915: haldol IV given. However, after giving haldol iv was flushed and started to leak. Unsure whether pt received haldol or not. Night shift RN was notified and will continue to monitor pt's condition.

## 2014-04-09 NOTE — ED Provider Notes (Signed)
Recheck just prior to transfer with [facility] present; patient remains with altered mental status sitting up on his own groaning and moaning with incomprehensible speech moving all 4 extremities spontaneously with pulse oximetry 94% to 95% normal room air with lungs clear and unlabored bilaterally with abdomen soft nontender patient not following commands. 1715 The patient appears reasonably stabilized for transfer considering the current resources, flow, and capabilities available in the ED at this time, and I doubt any other [facility] requiring further screening and/or treatment in the ED prior to transfer.  Babette Relic, MD 04/12/14 2141

## 2014-04-09 NOTE — ED Notes (Signed)
Pt transported to CT ?

## 2014-04-09 NOTE — ED Notes (Addendum)
Dr Darl Householder is aware that patient is very agitated- and will not allow [place] to perform EKG. Patient pulling leads and gown off. RN Lilia Pro and Dr Darl Householder notified.

## 2014-04-10 ENCOUNTER — Inpatient Hospital Stay (HOSPITAL_COMMUNITY): Payer: Medicare Other

## 2014-04-10 DIAGNOSIS — R339 Retention of urine, unspecified: Secondary | ICD-10-CM

## 2014-04-10 DIAGNOSIS — F3130 Bipolar disorder, current episode depressed, mild or moderate severity, unspecified: Secondary | ICD-10-CM

## 2014-04-10 LAB — COMPREHENSIVE METABOLIC PANEL
ALT: 13 U/L (ref 0–53)
AST: 45 U/L — ABNORMAL HIGH (ref 0–37)
Albumin: 4.3 g/dL (ref 3.5–5.2)
Alkaline Phosphatase: 92 U/L (ref 39–117)
BUN: 31 mg/dL — ABNORMAL HIGH (ref 6–23)
CO2: 12 mEq/L — ABNORMAL LOW (ref 19–32)
Calcium: 10.6 mg/dL — ABNORMAL HIGH (ref 8.4–10.5)
Chloride: 105 mEq/L (ref 96–112)
Creatinine, Ser: 2.79 mg/dL — ABNORMAL HIGH (ref 0.50–1.35)
GFR calc Af Amer: 26 mL/min — ABNORMAL LOW (ref >90)
GFR calc non Af Amer: 22 mL/min — ABNORMAL LOW (ref >90)
Glucose, Bld: 81 mg/dL (ref 70–99)
Potassium: 5 mEq/L (ref 3.7–5.3)
Sodium: 136 mEq/L — ABNORMAL LOW (ref 137–147)
Total Bilirubin: 0.7 mg/dL (ref 0.3–1.2)
Total Protein: 7.2 g/dL (ref 6.0–8.3)

## 2014-04-10 LAB — URINE CULTURE
Colony Count: NO GROWTH
Culture: NO GROWTH

## 2014-04-10 LAB — CBC
HCT: 27.4 % — ABNORMAL LOW (ref 39.0–52.0)
Hemoglobin: 8.8 g/dL — ABNORMAL LOW (ref 13.0–17.0)
MCH: 30.4 pg (ref 26.0–34.0)
MCHC: 32.1 g/dL (ref 30.0–36.0)
MCV: 94.8 fL (ref 78.0–100.0)
Platelets: 143 10*3/uL — ABNORMAL LOW (ref 150–400)
RBC: 2.89 MIL/uL — ABNORMAL LOW (ref 4.22–5.81)
RDW: 14 % (ref 11.5–15.5)
WBC: 8 10*3/uL (ref 4.0–10.5)

## 2014-04-10 LAB — STREP PNEUMONIAE URINARY ANTIGEN: Strep Pneumo Urinary Antigen: NEGATIVE

## 2014-04-10 MED ORDER — SODIUM CHLORIDE 0.9 % IV SOLN
INTRAVENOUS | Status: DC
  Administered 2014-04-10 – 2014-04-11 (×4): via INTRAVENOUS

## 2014-04-10 NOTE — Progress Notes (Signed)
Pt seen by Dr. Florene Glen late last night- work up (ultrasound) is pending, is making urine.  No repeat labs as of yet.  I have re- ordered for today.  I will plan on looking at labs and seeing patient in AM.  Call if you have questions today  Massa Pe A [phone]

## 2014-04-10 NOTE — Care Management Note (Addendum)
    Page 1 of 1   04/23/2014     10:45:47 AM CARE MANAGEMENT NOTE 04/23/2014  Patient:  LAURY, TENNYSON   Account Number:  [id number]  Date Initiated:  04/10/2014  Documentation initiated by:  Tomi Bamberger  Subjective/Objective Assessment:   dx lithium toxicity, confusion, arf, ?pna  admit- lives with wife     Action/Plan:   Anticipated DC Date:  04/23/2014   Anticipated DC Plan:  [place]  In-house referral  Clinical Social Worker      DC Planning Services  CM consult      Choice offered to / List presented to:             Status of service:  Completed, signed off Medicare Important Message given?  YES (If response is "NO", the following Medicare IM given date fields will be blank) Date Medicare IM given:  04/20/2014 Medicare IM given by:  [facility] Date Additional Medicare IM given:  04/23/2014 Additional Medicare IM given by:  Tomi Bamberger  Discharge Disposition:  [place]  Per UR Regulation:  Reviewed for med. necessity/level of care/duration of stay  If discussed at [place] of Stay Meetings, dates discussed:    Comments:  04/23/14 [place], BSN [phone] patient for dc to [facility] today.  04/20/2014 1500 LTAC referral sent. Pt not appropriate for LTAC. Jonnie Finner RN CCM Case Mgmt phone [phone]

## 2014-04-10 NOTE — Progress Notes (Signed)
[facility] PROGRESS NOTE  Jeffrey Herring [id number] DOB: July 17, 1951 DOA: 04/09/2014 PCP: Ashok Norris, MD  Assessment/Plan: 1. Acute encephalopathy -Likely multifactorial, suspect lithium toxicity, underlying infections process from UTI and possibly CAP, urinary retention.  -He seemed less agitated once foley was placed  -Continue supportive care, emperic antibiotic therapy, IV fluids  2.  Urinary Retention -Over 800 mL's of urine drained after the placement of foley.  -Will check a renal U/S -Continue AB therapy for possible UTI  3.  Acute Kidney Injury -Initial labs showing a Creatinine of 3.12 -Nephrology consulted -U/S pending   4.  Possible CAP -Initial CXR showed Right base atelectasis and/or PNA -Was started on emperic AB therapy with Rocephin and Azithromycin -Plan to repeat CXR in AM  5.  History of Bipolar Disorder -Presenting with Lithium toxicity -Psychiatry following    Code Status: Full Code Family Communication: Spoke with his wife Disposition Plan: Follow up on renal US, continue AB therapy, supportive care   Consultants:  Nephrology   Antibiotics:  Ceftriaxone  Azithromycin  HPI/Subjective: Patient admitted on 04/09/2014 presenting with increased confusion. Has h/o bipolar disorder, had a supratherapeutic Lithium level of 2.16.  On this morning's evaluation he appears agitated, thrashing his arms, not following commands. He was bladder scanned this moring and found to have over 800 mL's, for which Foley was placed.   Objective: Filed Vitals:   04/10/14 1200  BP: 121/63  Pulse: 80  Temp:   Resp:     Intake/Output Summary (Last 24 hours) at 04/10/14 1348 Last data filed at 04/10/14 0849  Gross per 24 hour  Intake      0 ml  Output    800 ml  Net   -800 ml   Filed Weights   04/09/14 1820  Weight: 68.221 kg (150 lb 6.4 oz)    Exam:   General:  Patient is agitated, thrashing about, significantly improved after  insertion of foley  Cardiovascular: Tachycardic, regular rate and rhythm, normal S1S2  Respiratory: Clear to auscultation, normal inspiratory effort  Abdomen: Having mild generalized tenderness to palpation  Musculoskeletal: no edema  Data Reviewed: Basic Metabolic Panel:  Recent Labs Lab 04/09/14 1204  NA 135*  K 4.6  CL 104  CO2 17*  GLUCOSE 125*  BUN 35*  CREATININE 3.12*  CALCIUM 11.1*   Liver Function Tests:  Recent Labs Lab 04/09/14 1204  AST 15  ALT 10  ALKPHOS 102  BILITOT 0.7  PROT 8.1  ALBUMIN 4.9   No results found for this basename: LIPASE, AMYLASE,  in the last 168 hours No results found for this basename: AMMONIA,  in the last 168 hours CBC:  Recent Labs Lab 04/09/14 1204 04/10/14 1012  WBC 12.3* 8.0  NEUTROABS 9.8*  --   HGB 11.3* 8.8*  HCT 34.0* 27.4*  MCV 91.4 94.8  PLT 203 143*   Cardiac Enzymes: No results found for this basename: CKTOTAL, CKMB, CKMBINDEX, TROPONINI,  in the last 168 hours BNP (last 3 results) No results found for this basename: PROBNP,  in the last 8760 hours CBG:  Recent Labs Lab 04/09/14 1211  GLUCAP 134*    Recent Results (from the past 240 hour(s))  CULTURE, BLOOD (ROUTINE X 2)     Status: None   Collection Time    04/09/14  3:01 PM      Result Value Ref Range Status   Specimen Description BLOOD FOREARM   Final   Special Requests BOTTLES DRAWN AEROBIC AND ANAEROBIC 6ML  Final   Culture  Setup Time     Final   Value: 04/09/2014 19:05     Performed at [facility]   Culture     Final   Value:        BLOOD CULTURE RECEIVED NO GROWTH TO DATE CULTURE WILL BE HELD FOR 5 DAYS BEFORE ISSUING A FINAL NEGATIVE REPORT     Performed at [facility]   Report Status PENDING   Incomplete  CULTURE, BLOOD (ROUTINE X 2)     Status: None   Collection Time    04/09/14  3:01 PM      Result Value Ref Range Status   Specimen Description BLOOD BLOOD LEFT FOREARM   Final   Special Requests     Final    Value: BOTTLES DRAWN AEROBIC AND ANAEROBIC 6ML POST ANTIBIOTICS   Culture  Setup Time     Final   Value: 04/09/2014 19:05     Performed at [facility]   Culture     Final   Value:        BLOOD CULTURE RECEIVED NO GROWTH TO DATE CULTURE WILL BE HELD FOR 5 DAYS BEFORE ISSUING A FINAL NEGATIVE REPORT     Performed at [facility]   Report Status PENDING   Incomplete     Studies: Dg Chest 2 View  04/09/2014   CLINICAL DATA:  Fever.  EXAM: CHEST  2 VIEW  COMPARISON:  Chest x-ray 01/02/2004.  FINDINGS: Mediastinum and hilar structures are normal. Stable borderline cardiomegaly. Prior CABG. Pacer leads are noted over the chest. Previously identified pacemaker has been removed. Right base atelectasis and/or infiltrate. No significant pleural effusion. No pneumothorax. No acute bony abnormality.  IMPRESSION: 1. Right base atelectasis and/or infiltrate. Follow-up chest x-ray to demonstrate clearing suggested. 2. Prior CABG.  Borderline cardiomegaly, no CHF.   Electronically Signed   By: Marcello Moores  Register   On: 04/09/2014 12:52    Scheduled Meds: . ALPRAZolam  1 mg Oral TID  . antiseptic oral rinse  15 mL Mouth Rinse q12n4p  . azithromycin  500 mg Oral Q24H  . cefTRIAXone (ROCEPHIN)  IV  1 g Intravenous Q24H  . chlorhexidine  15 mL Mouth Rinse BID  . clopidogrel  75 mg Oral q morning - 10a  . FLUoxetine  10 mg Oral q morning - 10a  . heparin  5,000 Units Subcutaneous 3 times per day  . metoprolol succinate  25 mg Oral q morning - 10a  . pneumococcal 23 valent vaccine  0.5 mL Intramuscular Tomorrow-1000  . simvastatin  10 mg Oral QHS  . sodium chloride  3 mL Intravenous Q12H  . tamsulosin  0.4 mg Oral q morning - 10a  . traZODone  100 mg Oral QHS   Continuous Infusions: . sodium chloride 100 mL/hr at 04/10/14 1159    Principal Problem:   Bipolar 1 disorder Active Problems:   Inguinal hernia   Cardiac pacemaker in situ   Acute renal failure   Leukocytosis   Pneumonia    Sepsis due to pneumonia   Lithium toxicity    Time spent: 35 min    Kelvin Cellar  [facility] Pager [phone]. If 7PM-7AM, please contact night-coverage at [email or web address], password [facility] 04/10/2014, 1:48 PM  LOS: 1 day

## 2014-04-10 NOTE — Progress Notes (Signed)
Utilization review completed.  

## 2014-04-10 NOTE — Consult Note (Signed)
[facility] Psychiatry Consult   Reason for Consult:  Bipolar disorder, lithium toxicity and AMS Referring Physician:  Dr.Chiu  Jeffrey Herring is an 63 y.o. male. Total Time spent with patient: 45 minutes  Assessment: AXIS I:  Bipolar, Depressed AXIS II:  Deferred AXIS III:   Past Medical History  Diagnosis Date  . Hyperlipemia   . Pacemaker   . Arrhythmia   . Diabetes insipidus   . History of kidney stones   . History of DVT (deep vein thrombosis)   . History of pulmonary embolism    AXIS IV:  other psychosocial or environmental problems, problems related to social environment and problems with primary support group AXIS V:  31-40 impairment in reality testing  Plan:  Supportive therapy provided about ongoing stressors. Discontinue lithium if it was not done May use Haldol and Ativan as needed for agitation and aggressive behaviors Appreciate psychiatric consultation and followup as clinically required  Subjective:   Jeffrey Herring is a 63 y.o. male patient admitted with AMS.  HPI: Patient was seen and chart reviewed. Patient was not able to contribute to the evaluation secondary to being sedated with medication. Patient has renal sonogram planned for today. Staff RN reported patient has been irritable, restless and poorly cooperative.   Medical history: Jeffrey Herring is a 63 y.o. male With a hx of bipolar disorder, DI, hx pacemaker placement who presents to the ED with increased confusion and agitation over the past week prior to admi. In the ED, the patient was found to have a low grade temp of 100F with CXR findings suggestive of R base infiltrate with WBC of over 12K and Cr of 3.12 (previous of over 2). The patient was started on empiric azithromycin and rocephin and the hospitalist service consulted for consideration for admission.   Review of Systems:  Per above, the remainder of the 10pt ros reviewed and are neg   HPI Elements:   Location:  Lithium  toxicity. Quality:  Sedation and confusion. Severity:  Acute. Timing:  Unknown causes of lithium toxicity.  Past Psychiatric History: Past Medical History  Diagnosis Date  . Hyperlipemia   . Pacemaker   . Arrhythmia   . Diabetes insipidus   . History of kidney stones   . History of DVT (deep vein thrombosis)   . History of pulmonary embolism     reports that he has been smoking Cigars.  He has never used smokeless tobacco. He reports that he does not drink alcohol or use illicit drugs. Family History  Problem Relation Age of Onset  . Depression Mother      Living Arrangements: Spouse/significant other   Abuse/Neglect [facility]) Physical Abuse: Denies Verbal Abuse: Denies Sexual Abuse: Denies Allergies:   Allergies  Allergen Reactions  . Aspirin Hives  . Codeine Nausea And Vomiting  . Demerol [Meperidine] Nausea And Vomiting    ACT Assessment Complete:  NO Objective: Blood pressure 147/63, pulse 68, temperature 98.5 F (36.9 C), temperature source Axillary, resp. rate 18, height '5\' 10"'  (1.778 m), weight 68.221 kg (150 lb 6.4 oz), SpO2 98.00%.Body mass index is 21.58 kg/(m^2). Results for orders placed during the hospital encounter of 04/09/14 (from the past 72 hour(s))  CBC WITH DIFFERENTIAL     Status: Abnormal   Collection Time    04/09/14 12:04 PM      Result Value Ref Range   WBC 12.3 (*) 4.0 - 10.5 K/uL   RBC 3.72 (*) 4.22 - 5.81 MIL/uL  Hemoglobin 11.3 (*) 13.0 - 17.0 g/dL   HCT 34.0 (*) 39.0 - 52.0 %   MCV 91.4  78.0 - 100.0 fL   MCH 30.4  26.0 - 34.0 pg   MCHC 33.2  30.0 - 36.0 g/dL   RDW 13.6  11.5 - 15.5 %   Platelets 203  150 - 400 K/uL   Neutrophils Relative % 79 (*) 43 - 77 %   Neutro Abs 9.8 (*) 1.7 - 7.7 K/uL   Lymphocytes Relative 8 (*) 12 - 46 %   Lymphs Abs 0.9  0.7 - 4.0 K/uL   Monocytes Relative 12  3 - 12 %   Monocytes Absolute 1.4 (*) 0.1 - 1.0 K/uL   Eosinophils Relative 1  0 - 5 %   Eosinophils Absolute 0.2  0.0 - 0.7 K/uL   Basophils  Relative 0  0 - 1 %   Basophils Absolute 0.0  0.0 - 0.1 K/uL  COMPREHENSIVE METABOLIC PANEL     Status: Abnormal   Collection Time    04/09/14 12:04 PM      Result Value Ref Range   Sodium 135 (*) 137 - 147 mEq/L   Potassium 4.6  3.7 - 5.3 mEq/L   Chloride 104  96 - 112 mEq/L   CO2 17 (*) 19 - 32 mEq/L   Glucose, Bld 125 (*) 70 - 99 mg/dL   BUN 35 (*) 6 - 23 mg/dL   Creatinine, Ser 3.12 (*) 0.50 - 1.35 mg/dL   Calcium 11.1 (*) 8.4 - 10.5 mg/dL   Total Protein 8.1  6.0 - 8.3 g/dL   Albumin 4.9  3.5 - 5.2 g/dL   AST 15  0 - 37 U/L   ALT 10  0 - 53 U/L   Alkaline Phosphatase 102  39 - 117 U/L   Total Bilirubin 0.7  0.3 - 1.2 mg/dL   GFR calc non Af Amer 20 (*) >90 mL/min   GFR calc Af Amer 23 (*) >90 mL/min   Comment: (NOTE)     The eGFR has been calculated using the CKD EPI equation.     This calculation has not been validated in all clinical situations.     eGFR's persistently <90 mL/min signify possible Chronic Kidney     Disease.  URINALYSIS, ROUTINE W REFLEX MICROSCOPIC     Status: Abnormal   Collection Time    04/09/14 12:09 PM      Result Value Ref Range   Color, Urine YELLOW  YELLOW   APPearance CLEAR  CLEAR   Specific Gravity, Urine 1.007  1.005 - 1.030   pH 6.0  5.0 - 8.0   Glucose, UA NEGATIVE  NEGATIVE mg/dL   Hgb urine dipstick NEGATIVE  NEGATIVE   Bilirubin Urine NEGATIVE  NEGATIVE   Ketones, ur NEGATIVE  NEGATIVE mg/dL   Protein, ur NEGATIVE  NEGATIVE mg/dL   Urobilinogen, UA 0.2  0.0 - 1.0 mg/dL   Nitrite NEGATIVE  NEGATIVE   Leukocytes, UA SMALL (*) NEGATIVE  URINE MICROSCOPIC-ADD ON     Status: None   Collection Time    04/09/14 12:09 PM      Result Value Ref Range   WBC, UA 3-6  <3 WBC/hpf   Bacteria, UA RARE  RARE  CBG MONITORING, ED     Status: Abnormal   Collection Time    04/09/14 12:11 PM      Result Value Ref Range   Glucose-Capillary 134 (*) 70 - 99 mg/dL  Randolm Idol, ED     Status: None   Collection Time    04/09/14 12:14 PM       Result Value Ref Range   Troponin i, poc 0.02  0.00 - 0.08 ng/mL   Comment 3            Comment: Due to the release kinetics of cTnI,     a negative result within the first hours     of the onset of symptoms does not rule out     myocardial infarction with certainty.     If myocardial infarction is still suspected,     repeat the test at appropriate intervals.  LITHIUM LEVEL     Status: Abnormal   Collection Time    04/09/14  1:27 PM      Result Value Ref Range   Lithium Lvl 2.16 (*) 0.80 - 1.40 mEq/L   Comment: CRITICAL RESULT CALLED TO, READ BACK BY AND VERIFIED WITH:     [facility] RN AT [id number] 04/09/14 BARFIELD,T  ETHANOL     Status: None   Collection Time    04/09/14  1:27 PM      Result Value Ref Range   Alcohol, Ethyl (B) <11  0 - 11 mg/dL   Comment:            LOWEST DETECTABLE LIMIT FOR     SERUM ALCOHOL IS 11 mg/dL     FOR MEDICAL PURPOSES ONLY  SALICYLATE LEVEL     Status: Abnormal   Collection Time    04/09/14  1:27 PM      Result Value Ref Range   Salicylate Lvl <1.4 (*) 2.8 - 20.0 mg/dL  ACETAMINOPHEN LEVEL     Status: None   Collection Time    04/09/14  1:27 PM      Result Value Ref Range   Acetaminophen (Tylenol), Serum <15.0  10 - 30 ug/mL   Comment:            THERAPEUTIC CONCENTRATIONS VARY     SIGNIFICANTLY. A RANGE OF 10-30     ug/mL MAY BE AN EFFECTIVE     CONCENTRATION FOR MANY PATIENTS.     HOWEVER, SOME ARE BEST TREATED     AT CONCENTRATIONS OUTSIDE THIS     RANGE.     ACETAMINOPHEN CONCENTRATIONS     >150 ug/mL AT 4 HOURS AFTER     INGESTION AND >50 ug/mL AT 12     HOURS AFTER INGESTION ARE     OFTEN ASSOCIATED WITH TOXIC     REACTIONS.  I-STAT CG4 LACTIC ACID, ED     Status: None   Collection Time    04/09/14  1:40 PM      Result Value Ref Range   Lactic Acid, Venous 1.55  0.5 - 2.2 mmol/L  CULTURE, BLOOD (ROUTINE X 2)     Status: None   Collection Time    04/09/14  3:01 PM      Result Value Ref Range   Specimen Description BLOOD  FOREARM     Special Requests BOTTLES DRAWN AEROBIC AND ANAEROBIC 6ML     Culture  Setup Time       Value: 04/09/2014 19:05     Performed at [facility]   Culture       Value:        BLOOD CULTURE RECEIVED NO GROWTH TO DATE CULTURE WILL BE HELD FOR 5 DAYS BEFORE ISSUING A FINAL NEGATIVE REPORT  Performed at [facility]   Report Status PENDING    CULTURE, BLOOD (ROUTINE X 2)     Status: None   Collection Time    04/09/14  3:01 PM      Result Value Ref Range   Specimen Description BLOOD BLOOD LEFT FOREARM     Special Requests       Value: BOTTLES DRAWN AEROBIC AND ANAEROBIC 6ML POST ANTIBIOTICS   Culture  Setup Time       Value: 04/09/2014 19:05     Performed at [facility]   Culture       Value:        BLOOD CULTURE RECEIVED NO GROWTH TO DATE CULTURE WILL BE HELD FOR 5 DAYS BEFORE ISSUING A FINAL NEGATIVE REPORT     Performed at [facility]   Report Status PENDING    HIV ANTIBODY (ROUTINE TESTING)     Status: None   Collection Time    04/09/14  3:01 PM      Result Value Ref Range   HIV 1&2 Ab, 4th Generation NONREACTIVE  NONREACTIVE   Comment: (NOTE)     A NONREACTIVE HIV Ag/Ab result does not exclude HIV infection since     the time frame for seroconversion is variable. If acute HIV infection     is suspected, a HIV-1 RNA Qualitative TMA test is recommended.     HIV-1/2 Antibody Diff         Not indicated.     HIV-1 RNA, Qual TMA           Not indicated.     PLEASE NOTE: This information has been disclosed to you from records     whose confidentiality may be protected by state law. If your state     requires such protection, then the state law prohibits you from making     any further disclosure of the information without the specific written     consent of the person to whom it pertains, or as otherwise permitted     by law. A general authorization for the release of medical or other     information is NOT sufficient for this purpose.      The performance of this assay has not been clinically validated in     patients less than 23 years old.     Performed at [facility]     Status: None   Collection Time    04/10/14  9:12 AM      Result Value Ref Range   Strep Pneumo Urinary Antigen NEGATIVE  NEGATIVE   Comment:            Infection due to S. pneumoniae     cannot be absolutely ruled out     since the antigen present     may be below the detection limit     of the test.  CBC     Status: Abnormal   Collection Time    04/10/14 10:12 AM      Result Value Ref Range   WBC 8.0  4.0 - 10.5 K/uL   RBC 2.89 (*) 4.22 - 5.81 MIL/uL   Hemoglobin 8.8 (*) 13.0 - 17.0 g/dL   HCT 27.4 (*) 39.0 - 52.0 %   MCV 94.8  78.0 - 100.0 fL   MCH 30.4  26.0 - 34.0 pg   MCHC 32.1  30.0 - 36.0 g/dL   RDW 14.0  11.5 - 15.5 %   Platelets 143 (*) 150 - 400 K/uL   Labs are reviewed and are pertinent for lithium toxicity and increased bun and cr.  Current Facility-Administered Medications  Medication Dose Route Frequency Provider Last Rate Last Dose  . 0.9 %  sodium chloride infusion   Intravenous Continuous Kelvin Cellar, MD 100 mL/hr at 04/10/14 1159    . acetaminophen (TYLENOL) tablet 650 mg  650 mg Oral Q6H PRN Donne Hazel, MD       Or  . acetaminophen (TYLENOL) suppository 650 mg  650 mg Rectal Q6H PRN Donne Hazel, MD      . ALPRAZolam [name]) tablet 1 mg  1 mg Oral TID Donne Hazel, MD   1 mg at 04/09/14 2201  . antiseptic oral rinse (BIOTENE) solution 15 mL  15 mL Mouth Rinse q12n4p Verlee Monte, MD      . azithromycin (ZITHROMAX) tablet 500 mg  500 mg Oral Q24H Donne Hazel, MD      . cefTRIAXone (ROCEPHIN) 1 g in dextrose 5 % 50 mL IVPB  1 g Intravenous Q24H Donne Hazel, MD      . chlorhexidine (PERIDEX) 0.12 % solution 15 mL  15 mL Mouth Rinse BID Verlee Monte, MD   15 mL at 04/10/14 0848  . clopidogrel (PLAVIX) tablet 75 mg  75 mg Oral q morning - 10a Donne Hazel, MD   75  mg at 04/09/14 2204  . FLUoxetine (PROZAC) capsule 10 mg  10 mg Oral q morning - 10a Donne Hazel, MD   10 mg at 04/09/14 2204  . haloperidol lactate (HALDOL) injection 5 mg  5 mg Intravenous Q6H PRN Donne Hazel, MD   5 mg at 04/10/14 0847  . heparin injection 5,000 Units  5,000 Units Subcutaneous 3 times per day Donne Hazel, MD   5,000 Units at 04/10/14 0554  . LORazepam (ATIVAN) injection 1 mg  1 mg Intravenous Q6H PRN Verlee Monte, MD   1 mg at 04/10/14 0626  . metoprolol succinate (TOPROL-XL) 24 hr tablet 25 mg  25 mg Oral q morning - 10a Donne Hazel, MD   25 mg at 04/09/14 2204  . ondansetron (ZOFRAN) tablet 4 mg  4 mg Oral Q6H PRN Donne Hazel, MD       Or  . ondansetron [facility]) injection 4 mg  4 mg Intravenous Q6H PRN Donne Hazel, MD      . pneumococcal 23 valent vaccine (PNU-IMMUNE) injection 0.5 mL  0.5 mL Intramuscular Tomorrow-1000 Verlee Monte, MD      . simvastatin (ZOCOR) tablet 10 mg  10 mg Oral QHS Donne Hazel, MD   10 mg at 04/09/14 2207  . sodium chloride 0.9 % injection 3 mL  3 mL Intravenous Q12H Donne Hazel, MD   3 mL at 04/09/14 2208  . tamsulosin (FLOMAX) capsule 0.4 mg  0.4 mg Oral q morning - 10a Donne Hazel, MD      . traZODone (DESYREL) tablet 100 mg  100 mg Oral QHS Donne Hazel, MD   100 mg at 04/09/14 2208    Psychiatric Specialty Exam: Physical Exam  ROS  Blood pressure 147/63, pulse 68, temperature 98.5 F (36.9 C), temperature source Axillary, resp. rate 18, height '5\' 10"'  (1.778 m), weight 68.221 kg (150 lb 6.4 oz), SpO2 98.00%.Body mass index is 21.58 kg/(m^2).  General Appearance: Guarded  Eye Contact::  Minimal  Speech:  NA  Volume:  Decreased  Mood:  NA  Affect:  NA  Thought Process:  NA  Orientation:  NA  Thought Content:  NA  Suicidal Thoughts:  No  Homicidal Thoughts:  No  Memory:  NA  Judgement:  NA  Insight:  NA  Psychomotor Activity:  NA  Concentration:  NA  Recall:  NA  Fund of Knowledge:NA  Language: NA   Akathisia:  NA  Handed:  Right  AIMS (if indicated):     Assets:  Financial Resources/Insurance Housing Intimacy Leisure Time Resilience Social Support Talents/Skills  Sleep:      Musculoskeletal: Strength & Muscle Tone: decreased Gait & Station: unable to stand Patient leans: N/A  Treatment Plan Summary: Daily contact with patient to assess and evaluate symptoms and progress in treatment Medication management  JONNALAGADDA,JANARDHAHA R. 04/10/2014 12:03 PM

## 2014-04-11 ENCOUNTER — Inpatient Hospital Stay (HOSPITAL_COMMUNITY): Payer: Medicare Other

## 2014-04-11 DIAGNOSIS — Z950 Presence of cardiac pacemaker: Secondary | ICD-10-CM

## 2014-04-11 LAB — BASIC METABOLIC PANEL
BUN: 34 mg/dL — ABNORMAL HIGH (ref 6–23)
CO2: 14 mEq/L — ABNORMAL LOW (ref 19–32)
Calcium: 10.1 mg/dL (ref 8.4–10.5)
Chloride: 112 mEq/L (ref 96–112)
Creatinine, Ser: 3.04 mg/dL — ABNORMAL HIGH (ref 0.50–1.35)
GFR calc Af Amer: 23 mL/min — ABNORMAL LOW (ref >90)
GFR calc non Af Amer: 20 mL/min — ABNORMAL LOW (ref >90)
Glucose, Bld: 72 mg/dL (ref 70–99)
Potassium: 5.1 mEq/L (ref 3.7–5.3)
Sodium: 143 mEq/L (ref 137–147)

## 2014-04-11 LAB — LEGIONELLA ANTIGEN, URINE: Legionella Antigen, Urine: NEGATIVE

## 2014-04-11 LAB — CBC
HCT: 29.5 % — ABNORMAL LOW (ref 39.0–52.0)
Hemoglobin: 9.2 g/dL — ABNORMAL LOW (ref 13.0–17.0)
MCH: 30.2 pg (ref 26.0–34.0)
MCHC: 31.2 g/dL (ref 30.0–36.0)
MCV: 96.7 fL (ref 78.0–100.0)
Platelets: 146 10*3/uL — ABNORMAL LOW (ref 150–400)
RBC: 3.05 MIL/uL — ABNORMAL LOW (ref 4.22–5.81)
RDW: 14.3 % (ref 11.5–15.5)
WBC: 7.7 10*3/uL (ref 4.0–10.5)

## 2014-04-11 MED ORDER — DARBEPOETIN ALFA-POLYSORBATE 100 MCG/0.5ML IJ SOLN
100.0000 ug | INTRAMUSCULAR | Status: DC
  Administered 2014-04-11: 100 ug via SUBCUTANEOUS
  Filled 2014-04-11 (×3): qty 0.5

## 2014-04-11 MED ORDER — SODIUM BICARBONATE 650 MG PO TABS
1300.0000 mg | ORAL_TABLET | Freq: Two times a day (BID) | ORAL | Status: DC
  Administered 2014-04-11 – 2014-04-23 (×24): 1300 mg via ORAL
  Filled 2014-04-11 (×26): qty 2

## 2014-04-11 NOTE — Progress Notes (Signed)
[facility] PROGRESS NOTE  Jeffrey Herring [id number] DOB: 02-12-1951 DOA: 04/09/2014 PCP: Ashok Norris, MD   HPI/Subjective: Patient admitted on 04/09/2014 presenting with increased confusion. Has h/o bipolar disorder, had a supratherapeutic Lithium level of 2.16.   Seen with sitter at bedside, denies any complaints, has very short attention span  Assessment/Plan: 1. Acute encephalopathy -Likely multifactorial, suspect lithium toxicity, underlying infections process from UTI and possibly CAP, urinary retention.  -He seemed less agitated once foley was placed  -Continue supportive care, emperic antibiotic therapy, IV fluids  2.  Urinary Retention -Over 800 mL's of urine drained after the placement of foley.  -Will check a renal U/S -Continue AB therapy for possible UTI  3.  Acute Kidney Injury -Initial labs showing a Creatinine of 3.12 -Nephrology consulted -U/S showed clinical renal disease, no evidence of obstruction  4.  Possible CAP -Initial CXR showed Right base atelectasis and/or PNA -Was started on emperic AB therapy with Rocephin and Azithromycin -Plan to repeat CXR in AM  5.  History of Bipolar Disorder -Presenting with Lithium toxicity -Psychiatry following, recommended Ativan and Haldol as needed.    Code Status: Full Code Family Communication: Spoke with his wife Disposition Plan: Follow up on renal US, continue AB therapy, supportive care   Consultants:  Nephrology   Antibiotics:  Ceftriaxone  Azithromycin   Objective: Filed Vitals:   04/11/14 1046  BP: 114/68  Pulse: 86  Temp:   Resp:     Intake/Output Summary (Last 24 hours) at 04/11/14 1247 Last data filed at 04/11/14 1157  Gross per 24 hour  Intake    240 ml  Output   1675 ml  Net  -1435 ml   Filed Weights   04/09/14 1820  Weight: 68.221 kg (150 lb 6.4 oz)    Exam:   General:  Patient is agitated, thrashing about, significantly improved after insertion of  foley  Cardiovascular: Tachycardic, regular rate and rhythm, normal S1S2  Respiratory: Clear to auscultation, normal inspiratory effort  Abdomen: Having mild generalized tenderness to palpation  Musculoskeletal: no edema  Data Reviewed: Basic Metabolic Panel:  Recent Labs Lab 04/09/14 1204 04/10/14 1012 04/11/14 0452  NA 135* 136* 143  K 4.6 5.0 5.1  CL 104 105 112  CO2 17* 12* 14*  GLUCOSE 125* 81 72  BUN 35* 31* 34*  CREATININE 3.12* 2.79* 3.04*  CALCIUM 11.1* 10.6* 10.1   Liver Function Tests:  Recent Labs Lab 04/09/14 1204 04/10/14 1012  AST 15 45*  ALT 10 13  ALKPHOS 102 92  BILITOT 0.7 0.7  PROT 8.1 7.2  ALBUMIN 4.9 4.3   No results found for this basename: LIPASE, AMYLASE,  in the last 168 hours No results found for this basename: AMMONIA,  in the last 168 hours CBC:  Recent Labs Lab 04/09/14 1204 04/10/14 1012 04/11/14 0452  WBC 12.3* 8.0 7.7  NEUTROABS 9.8*  --   --   HGB 11.3* 8.8* 9.2*  HCT 34.0* 27.4* 29.5*  MCV 91.4 94.8 96.7  PLT 203 143* 146*   Cardiac Enzymes: No results found for this basename: CKTOTAL, CKMB, CKMBINDEX, TROPONINI,  in the last 168 hours BNP (last 3 results) No results found for this basename: PROBNP,  in the last 8760 hours CBG:  Recent Labs Lab 04/09/14 1211  GLUCAP 134*    Recent Results (from the past 240 hour(s))  URINE CULTURE     Status: None   Collection Time    04/09/14 12:09 PM  Result Value Ref Range Status   Specimen Description URINE, CLEAN CATCH   Final   Special Requests NONE   Final   Culture  Setup Time     Final   Value: 04/09/2014 19:48     Performed at [place]     Final   Value: NO GROWTH     Performed at [facility]   Culture     Final   Value: NO GROWTH     Performed at [facility]   Report Status 04/10/2014 FINAL   Final  CULTURE, BLOOD (ROUTINE X 2)     Status: None   Collection Time    04/09/14  3:01 PM      Result Value  Ref Range Status   Specimen Description BLOOD FOREARM   Final   Special Requests BOTTLES DRAWN AEROBIC AND ANAEROBIC 6ML   Final   Culture  Setup Time     Final   Value: 04/09/2014 19:05     Performed at [facility]   Culture     Final   Value:        BLOOD CULTURE RECEIVED NO GROWTH TO DATE CULTURE WILL BE HELD FOR 5 DAYS BEFORE ISSUING A FINAL NEGATIVE REPORT     Performed at [facility]   Report Status PENDING   Incomplete  CULTURE, BLOOD (ROUTINE X 2)     Status: None   Collection Time    04/09/14  3:01 PM      Result Value Ref Range Status   Specimen Description BLOOD BLOOD LEFT FOREARM   Final   Special Requests     Final   Value: BOTTLES DRAWN AEROBIC AND ANAEROBIC 6ML POST ANTIBIOTICS   Culture  Setup Time     Final   Value: 04/09/2014 19:05     Performed at [facility]   Culture     Final   Value:        BLOOD CULTURE RECEIVED NO GROWTH TO DATE CULTURE WILL BE HELD FOR 5 DAYS BEFORE ISSUING A FINAL NEGATIVE REPORT     Performed at [facility]   Report Status PENDING   Incomplete     Studies: US Renal  04/11/2014   CLINICAL DATA:  Acute kidney injury and urinary tract infection  EXAM: RENAL/URINARY TRACT ULTRASOUND COMPLETE  COMPARISON:  None.  FINDINGS: Right Kidney:  Length: 10 cm. Echogenic cortex with abnormal cortical medullary differentiation. There is a 9 mm simple appearing cyst in the interpolar region. No hydronephrosis or evidence of solid mass.  Left Kidney:  Length: 10 cm. Echogenic cortex with abnormal cortical medullary differentiation. No hydronephrosis or evidence of mass.  Bladder:  Completely decompressed around a Foley catheter.  IMPRESSION: 1. No hydronephrosis. 2. Chronic medical renal disease.   Electronically Signed   By: Jorje Guild M.D.   On: 04/11/2014 06:17   Dg Chest Port 1 View  04/11/2014   CLINICAL DATA:  Possible pneumonia.  EXAM: PORTABLE CHEST - 1 VIEW  COMPARISON:  Chest x-ray 04/09/2014.  FINDINGS:  Status post median sternotomy. Epicardial wires are seen projecting over the right atrium. Abandoned wires projecting over the upper right hemithorax presumably from prior pacemaker. Lung volumes are low. There are some ill-defined bibasilar opacities favored to reflect some mild subsegmental atelectasis. No consolidative airspace disease. No pleural effusions. No pneumothorax. No evidence of pulmonary edema. Heart size is borderline enlarged. Upper mediastinal contours are unremarkable.  Atherosclerosis in the thoracic aorta.  IMPRESSION: 1. Low lung volumes with probable bibasilar subsegmental atelectasis. 2. Atherosclerosis. 3. Postoperative changes and support apparatus, as above.   Electronically Signed   By: Vinnie Langton M.D.   On: 04/11/2014 11:24    Scheduled Meds: . ALPRAZolam  1 mg Oral TID  . antiseptic oral rinse  15 mL Mouth Rinse q12n4p  . azithromycin  500 mg Oral Q24H  . cefTRIAXone (ROCEPHIN)  IV  1 g Intravenous Q24H  . chlorhexidine  15 mL Mouth Rinse BID  . clopidogrel  75 mg Oral q morning - 10a  . darbepoetin (ARANESP) injection - NON-DIALYSIS  100 mcg Subcutaneous Q Sat-1800  . FLUoxetine  10 mg Oral q morning - 10a  . heparin  5,000 Units Subcutaneous 3 times per day  . metoprolol succinate  25 mg Oral q morning - 10a  . pneumococcal 23 valent vaccine  0.5 mL Intramuscular Tomorrow-1000  . simvastatin  10 mg Oral QHS  . sodium bicarbonate  1,300 mg Oral BID  . sodium chloride  3 mL Intravenous Q12H  . tamsulosin  0.4 mg Oral q morning - 10a  . traZODone  100 mg Oral QHS   Continuous Infusions: . sodium chloride 100 mL/hr at 04/10/14 2213    Principal Problem:   Bipolar 1 disorder Active Problems:   Inguinal hernia   Cardiac pacemaker in situ   Acute renal failure   Leukocytosis   Pneumonia   Sepsis due to pneumonia   Lithium toxicity    Time spent: 35 min    [facility] Pager [phone]. If 7PM-7AM, please contact  night-coverage at [email or web address], password [facility] 04/11/2014, 12:47 PM  LOS: 2 days

## 2014-04-11 NOTE — Progress Notes (Signed)
Subjective:  Still quite agitated- took several tries to engage him with one work answers, mittens in place.  Is making urine, creatinine trending down although went up slightly this AM- of note seemed to have element of BOO- large amount of urine after foley placed yesterday.  U/A no hydro, 10 cm echogenic kidneys Objective Vital signs in last 24 hours: Filed Vitals:   04/10/14 1200 04/10/14 1429 04/10/14 2222 04/11/14 0634  BP: 121/63 98/54 109/69 116/65  Pulse: 80 88 72 81  Temp:  98.1 F (36.7 C) 98.3 F (36.8 C) 98.3 F (36.8 C)  TempSrc:  Axillary Oral Oral  Resp:  18 20 20   Height:      Weight:      SpO2:  94% 94% 95%   Weight change:   Intake/Output Summary (Last 24 hours) at 04/11/14 1046 Last data filed at 04/11/14 0900  Gross per 24 hour  Intake    240 ml  Output   1400 ml  Net  -1160 ml    Assessment/ Plan: Pt is a 63 y.o. yo male with bipolar d/o on lithium with CKD (creatinine 2-2.2 in 2014) who was admitted on 04/09/2014 with increased agitation, A on CKD with lithium toxicity, hypercalcemia and BOO.  Also possible PNA  Assessment/Plan: 1. A on CKD- baseline creatinine of around 2- likely due to lithium toxicity.  Now with A on CRF in the setting of high lithium level, hypercalcemia and on lisinopril and also clinical evidence of BOO.  U/A bland. Lithium and lisinopril held, foley in place.  Nonoliguric, kidney function overall improved even with bump in creatinine this AM.  There are no indications for dialysis and in this state patient would not be a candidate.   2. Mental status- really do not know baseline.  Reportedly was at home with wife PTA so must have been better than what it is now.  Toxic/metabolic vs psych.  Since we are saying that lithium should be stopped, will need psych input on what to treat him with   3. Anemia- likely due to CKD- will check iron stores and start ESA 4. Secondary hyperparathyroidism- will check appropriate labs and treat as able 5.  HTN/volume- if anything dry, is on toprol and flomax with a good blood pressure.  IVF at 100 per hour 6. Metabolic acidosis - will start bicarb- should help potassium as well 7. ID- CAP- on rocephin and zithromax- per primary team  GOLDSBOROUGH,KELLIE A    Labs: Basic Metabolic Panel:  Recent Labs Lab 04/09/14 1204 04/10/14 1012 04/11/14 0452  NA 135* 136* 143  K 4.6 5.0 5.1  CL 104 105 112  CO2 17* 12* 14*  GLUCOSE 125* 81 72  BUN 35* 31* 34*  CREATININE 3.12* 2.79* 3.04*  CALCIUM 11.1* 10.6* 10.1   Liver Function Tests:  Recent Labs Lab 04/09/14 1204 04/10/14 1012  AST 15 45*  ALT 10 13  ALKPHOS 102 92  BILITOT 0.7 0.7  PROT 8.1 7.2  ALBUMIN 4.9 4.3   No results found for this basename: LIPASE, AMYLASE,  in the last 168 hours No results found for this basename: AMMONIA,  in the last 168 hours CBC:  Recent Labs Lab 04/09/14 1204 04/10/14 1012 04/11/14 0452  WBC 12.3* 8.0 7.7  NEUTROABS 9.8*  --   --   HGB 11.3* 8.8* 9.2*  HCT 34.0* 27.4* 29.5*  MCV 91.4 94.8 96.7  PLT 203 143* 146*   Cardiac Enzymes: No results found for this basename:  CKTOTAL, CKMB, CKMBINDEX, TROPONINI,  in the last 168 hours CBG:  Recent Labs Lab 04/09/14 1211  GLUCAP 134*    Iron Studies: No results found for this basename: IRON, TIBC, TRANSFERRIN, FERRITIN,  in the last 72 hours Studies/Results: Dg Chest 2 View  04/09/2014   CLINICAL DATA:  Fever.  EXAM: CHEST  2 VIEW  COMPARISON:  Chest x-ray 01/02/2004.  FINDINGS: Mediastinum and hilar structures are normal. Stable borderline cardiomegaly. Prior CABG. Pacer leads are noted over the chest. Previously identified pacemaker has been removed. Right base atelectasis and/or infiltrate. No significant pleural effusion. No pneumothorax. No acute bony abnormality.  IMPRESSION: 1. Right base atelectasis and/or infiltrate. Follow-up chest x-ray to demonstrate clearing suggested. 2. Prior CABG.  Borderline cardiomegaly, no CHF.    Electronically Signed   By: Marcello Moores  Register   On: 04/09/2014 12:52   US Renal  04/11/2014   CLINICAL DATA:  Acute kidney injury and urinary tract infection  EXAM: RENAL/URINARY TRACT ULTRASOUND COMPLETE  COMPARISON:  None.  FINDINGS: Right Kidney:  Length: 10 cm. Echogenic cortex with abnormal cortical medullary differentiation. There is a 9 mm simple appearing cyst in the interpolar region. No hydronephrosis or evidence of solid mass.  Left Kidney:  Length: 10 cm. Echogenic cortex with abnormal cortical medullary differentiation. No hydronephrosis or evidence of mass.  Bladder:  Completely decompressed around a Foley catheter.  IMPRESSION: 1. No hydronephrosis. 2. Chronic medical renal disease.   Electronically Signed   By: Jorje Guild M.D.   On: 04/11/2014 06:17   Medications: Infusions: . sodium chloride 100 mL/hr at 04/10/14 2213    Scheduled Medications: . ALPRAZolam  1 mg Oral TID  . antiseptic oral rinse  15 mL Mouth Rinse q12n4p  . azithromycin  500 mg Oral Q24H  . cefTRIAXone (ROCEPHIN)  IV  1 g Intravenous Q24H  . chlorhexidine  15 mL Mouth Rinse BID  . clopidogrel  75 mg Oral q morning - 10a  . FLUoxetine  10 mg Oral q morning - 10a  . heparin  5,000 Units Subcutaneous 3 times per day  . metoprolol succinate  25 mg Oral q morning - 10a  . pneumococcal 23 valent vaccine  0.5 mL Intramuscular Tomorrow-1000  . simvastatin  10 mg Oral QHS  . sodium chloride  3 mL Intravenous Q12H  . tamsulosin  0.4 mg Oral q morning - 10a  . traZODone  100 mg Oral QHS    have reviewed scheduled and prn medications.  Physical Exam: General: agitated, mittens- did eventually get a one word answer Heart: RRR Lungs: clear Abdomen: soft, non tender Extremities: no edema    04/11/2014,10:46 AM  LOS: 2 days

## 2014-04-12 LAB — IRON AND TIBC
Iron: 88 ug/dL (ref 42–135)
Saturation Ratios: 39 % (ref 20–55)
TIBC: 227 ug/dL (ref 215–435)
UIBC: 139 ug/dL (ref 125–400)

## 2014-04-12 LAB — RENAL FUNCTION PANEL
Albumin: 3.9 g/dL (ref 3.5–5.2)
BUN: 30 mg/dL — ABNORMAL HIGH (ref 6–23)
CO2: 15 mEq/L — ABNORMAL LOW (ref 19–32)
Calcium: 10.2 mg/dL (ref 8.4–10.5)
Chloride: 114 mEq/L — ABNORMAL HIGH (ref 96–112)
Creatinine, Ser: 2.79 mg/dL — ABNORMAL HIGH (ref 0.50–1.35)
GFR calc Af Amer: 26 mL/min — ABNORMAL LOW (ref >90)
GFR calc non Af Amer: 22 mL/min — ABNORMAL LOW (ref >90)
Glucose, Bld: 112 mg/dL — ABNORMAL HIGH (ref 70–99)
Phosphorus: 2.9 mg/dL (ref 2.3–4.6)
Potassium: 4.4 mEq/L (ref 3.7–5.3)
Sodium: 145 mEq/L (ref 137–147)

## 2014-04-12 LAB — FERRITIN: Ferritin: 366 ng/mL — ABNORMAL HIGH (ref 22–322)

## 2014-04-12 LAB — LITHIUM LEVEL: Lithium Lvl: 1.22 mEq/L (ref 0.80–1.40)

## 2014-04-12 MED ORDER — WHITE PETROLATUM GEL
Status: AC
  Administered 2014-04-12: 1
  Filled 2014-04-12: qty 5

## 2014-04-12 NOTE — Progress Notes (Signed)
[facility] PROGRESS NOTE  Jeffrey Herring [id number] DOB: 1950-11-06 DOA: 04/09/2014 PCP: Ashok Norris, MD   HPI/Subjective: Patient admitted on 04/09/2014 presenting with increased confusion. Has h/o bipolar disorder, had a supratherapeutic Lithium level of 2.16.   Seen with sitter at bedside, NOT engaging in meaningful conversation today.  Assessment/Plan: 1. Acute encephalopathy -Likely multifactorial, suspect lithium toxicity, underlying infections process from UTI and possibly CAP, urinary retention.  -He seemed less agitated once foley was placed  -Continue supportive care, emperic antibiotic therapy, IV fluids  2.  Urinary Retention -Over 800 mL's of urine drained after the placement of foley.  -Will check a renal U/S -Continue AB therapy for possible UTI  3.  Acute Kidney Injury -Initial labs showing a Creatinine of 3.12, creatinine today is 2.79. -Nephrology consulted -U/S showed clinical renal disease, no evidence of obstruction  4.  Possible CAP -Initial CXR showed Right base atelectasis and/or PNA -Was started on emperic AB therapy with Rocephin and Azithromycin -Plan to repeat CXR in AM  5.  History of Bipolar Disorder -Presenting with Lithium toxicity -Psychiatry following, recommended Ativan and Haldol as needed.    Code Status: Full Code Family Communication: Spoke with his wife Disposition Plan: Follow up on renal US, continue AB therapy, supportive care   Consultants:  Nephrology   Antibiotics:  Ceftriaxone  Azithromycin   Objective: Filed Vitals:   04/12/14 1008  BP: 136/76  Pulse: 78  Temp:   Resp:     Intake/Output Summary (Last 24 hours) at 04/12/14 1048 Last data filed at 04/12/14 0432  Gross per 24 hour  Intake    100 ml  Output   1925 ml  Net  -1825 ml   Filed Weights   04/09/14 1820  Weight: 68.221 kg (150 lb 6.4 oz)    Exam:   General:  Patient is agitated, thrashing about, significantly improved  after insertion of foley  Cardiovascular: Tachycardic, regular rate and rhythm, normal S1S2  Respiratory: Clear to auscultation, normal inspiratory effort  Abdomen: Having mild generalized tenderness to palpation  Musculoskeletal: no edema  Data Reviewed: Basic Metabolic Panel:  Recent Labs Lab 04/09/14 1204 04/10/14 1012 04/11/14 0452 04/12/14 0540  NA 135* 136* 143 145  K 4.6 5.0 5.1 4.4  CL 104 105 112 114*  CO2 17* 12* 14* 15*  GLUCOSE 125* 81 72 112*  BUN 35* 31* 34* 30*  CREATININE 3.12* 2.79* 3.04* 2.79*  CALCIUM 11.1* 10.6* 10.1 10.2  PHOS  --   --   --  2.9   Liver Function Tests:  Recent Labs Lab 04/09/14 1204 04/10/14 1012 04/12/14 0540  AST 15 45*  --   ALT 10 13  --   ALKPHOS 102 92  --   BILITOT 0.7 0.7  --   PROT 8.1 7.2  --   ALBUMIN 4.9 4.3 3.9   No results found for this basename: LIPASE, AMYLASE,  in the last 168 hours No results found for this basename: AMMONIA,  in the last 168 hours CBC:  Recent Labs Lab 04/09/14 1204 04/10/14 1012 04/11/14 0452  WBC 12.3* 8.0 7.7  NEUTROABS 9.8*  --   --   HGB 11.3* 8.8* 9.2*  HCT 34.0* 27.4* 29.5*  MCV 91.4 94.8 96.7  PLT 203 143* 146*   Cardiac Enzymes: No results found for this basename: CKTOTAL, CKMB, CKMBINDEX, TROPONINI,  in the last 168 hours BNP (last 3 results) No results found for this basename: PROBNP,  in the last 8760 hours  CBG:  Recent Labs Lab 04/09/14 1211  GLUCAP 134*    Recent Results (from the past 240 hour(s))  URINE CULTURE     Status: None   Collection Time    04/09/14 12:09 PM      Result Value Ref Range Status   Specimen Description URINE, CLEAN CATCH   Final   Special Requests NONE   Final   Culture  Setup Time     Final   Value: 04/09/2014 19:48     Performed at [place]     Final   Value: NO GROWTH     Performed at [facility]   Culture     Final   Value: NO GROWTH     Performed at [facility]   Report  Status 04/10/2014 FINAL   Final  CULTURE, BLOOD (ROUTINE X 2)     Status: None   Collection Time    04/09/14  3:01 PM      Result Value Ref Range Status   Specimen Description BLOOD FOREARM   Final   Special Requests BOTTLES DRAWN AEROBIC AND ANAEROBIC 6ML   Final   Culture  Setup Time     Final   Value: 04/09/2014 19:05     Performed at [facility]   Culture     Final   Value:        BLOOD CULTURE RECEIVED NO GROWTH TO DATE CULTURE WILL BE HELD FOR 5 DAYS BEFORE ISSUING A FINAL NEGATIVE REPORT     Performed at [facility]   Report Status PENDING   Incomplete  CULTURE, BLOOD (ROUTINE X 2)     Status: None   Collection Time    04/09/14  3:01 PM      Result Value Ref Range Status   Specimen Description BLOOD BLOOD LEFT FOREARM   Final   Special Requests     Final   Value: BOTTLES DRAWN AEROBIC AND ANAEROBIC 6ML POST ANTIBIOTICS   Culture  Setup Time     Final   Value: 04/09/2014 19:05     Performed at [facility]   Culture     Final   Value:        BLOOD CULTURE RECEIVED NO GROWTH TO DATE CULTURE WILL BE HELD FOR 5 DAYS BEFORE ISSUING A FINAL NEGATIVE REPORT     Performed at [facility]   Report Status PENDING   Incomplete     Studies: US Renal  04/11/2014   CLINICAL DATA:  Acute kidney injury and urinary tract infection  EXAM: RENAL/URINARY TRACT ULTRASOUND COMPLETE  COMPARISON:  None.  FINDINGS: Right Kidney:  Length: 10 cm. Echogenic cortex with abnormal cortical medullary differentiation. There is a 9 mm simple appearing cyst in the interpolar region. No hydronephrosis or evidence of solid mass.  Left Kidney:  Length: 10 cm. Echogenic cortex with abnormal cortical medullary differentiation. No hydronephrosis or evidence of mass.  Bladder:  Completely decompressed around a Foley catheter.  IMPRESSION: 1. No hydronephrosis. 2. Chronic medical renal disease.   Electronically Signed   By: Jorje Guild M.D.   On: 04/11/2014 06:17   Dg Chest Port 1  View  04/11/2014   CLINICAL DATA:  Possible pneumonia.  EXAM: PORTABLE CHEST - 1 VIEW  COMPARISON:  Chest x-ray 04/09/2014.  FINDINGS: Status post median sternotomy. Epicardial wires are seen projecting over the right atrium. Abandoned wires projecting over the upper right hemithorax presumably  from prior pacemaker. Lung volumes are low. There are some ill-defined bibasilar opacities favored to reflect some mild subsegmental atelectasis. No consolidative airspace disease. No pleural effusions. No pneumothorax. No evidence of pulmonary edema. Heart size is borderline enlarged. Upper mediastinal contours are unremarkable. Atherosclerosis in the thoracic aorta.  IMPRESSION: 1. Low lung volumes with probable bibasilar subsegmental atelectasis. 2. Atherosclerosis. 3. Postoperative changes and support apparatus, as above.   Electronically Signed   By: Vinnie Langton M.D.   On: 04/11/2014 11:24    Scheduled Meds: . ALPRAZolam  1 mg Oral TID  . antiseptic oral rinse  15 mL Mouth Rinse q12n4p  . azithromycin  500 mg Oral Q24H  . cefTRIAXone (ROCEPHIN)  IV  1 g Intravenous Q24H  . chlorhexidine  15 mL Mouth Rinse BID  . clopidogrel  75 mg Oral q morning - 10a  . darbepoetin (ARANESP) injection - NON-DIALYSIS  100 mcg Subcutaneous Q Sat-1800  . FLUoxetine  10 mg Oral q morning - 10a  . heparin  5,000 Units Subcutaneous 3 times per day  . metoprolol succinate  25 mg Oral q morning - 10a  . pneumococcal 23 valent vaccine  0.5 mL Intramuscular Tomorrow-1000  . simvastatin  10 mg Oral QHS  . sodium bicarbonate  1,300 mg Oral BID  . sodium chloride  3 mL Intravenous Q12H  . tamsulosin  0.4 mg Oral q morning - 10a  . traZODone  100 mg Oral QHS   Continuous Infusions: . sodium chloride 100 mL/hr at 04/11/14 2151    Principal Problem:   Bipolar 1 disorder Active Problems:   Inguinal hernia   Cardiac pacemaker in situ   Acute renal failure   Leukocytosis   Pneumonia   Sepsis due to pneumonia    Lithium toxicity    Time spent: 35 min    [facility] Pager [phone]. If 7PM-7AM, please contact night-coverage at [email or web address], password [facility] 04/12/2014, 10:48 AM  LOS: 3 days

## 2014-04-12 NOTE — Progress Notes (Signed)
Subjective:  Still quite agitated- took several tries to engage him with one work answers, mittens in place.  Is making urine, creatinine trending down     Objective Vital signs in last 24 hours: Filed Vitals:   04/11/14 1257 04/11/14 2125 04/12/14 0626 04/12/14 1008  BP: 136/71 120/68 142/77 136/76  Pulse: 78 86 66 78  Temp: 98.2 F (36.8 C) 97.1 F (36.2 C) 99.2 F (37.3 C)   TempSrc: Oral Oral Axillary   Resp: 19 20 18    Height:      Weight:      SpO2: 96% 95% 98%    Weight change:   Intake/Output Summary (Last 24 hours) at 04/12/14 1050 Last data filed at 04/12/14 0432  Gross per 24 hour  Intake    100 ml  Output   1925 ml  Net  -1825 ml    Assessment/ Plan: Pt is a 63 y.o. yo male with bipolar d/o on lithium with CKD (creatinine 2-2.2 in 2014) who was admitted on 04/09/2014 with increased agitation, A on CKD with lithium toxicity, hypercalcemia and BOO.  Also possible PNA  Assessment/Plan: 1. A on CKD- baseline creatinine of around 2- likely due to lithium toxicity.  Now with A on CRF in the setting of high lithium level, hypercalcemia and on lisinopril and also clinical evidence of BOO.  U/A bland. U/S unremarkable except for echogenic kidneys.  Lithium and lisinopril held, foley in place.  Nonoliguric, kidney function overall improved.  There are no indications for dialysis and in this state patient would not be a candidate.   2. Mental status- really do not know baseline.  Reportedly was at home with wife PTA so must have been better than what it is now.  Toxic/metabolic vs psych.  Since we are saying that lithium should be stopped, will need psych input on what to treat him with long term 3. Anemia- likely due to CKD- will check iron stores (pending) and start ESA 4. Secondary hyperparathyroidism- will check appropriate labs and treat as able- PTH pending 5. HTN/volume- volume status better, is on toprol and flomax with a good blood pressure.  Watch sodium , may need some  free water- encourage drinking 6. Metabolic acidosis - likely due to CKD-  on oral bicarb should help potassium as well 7. ID- CAP- on rocephin and zithromax- per primary team  GOLDSBOROUGH,KELLIE A    Labs: Basic Metabolic Panel:  Recent Labs Lab 04/10/14 1012 04/11/14 0452 04/12/14 0540  NA 136* 143 145  K 5.0 5.1 4.4  CL 105 112 114*  CO2 12* 14* 15*  GLUCOSE 81 72 112*  BUN 31* 34* 30*  CREATININE 2.79* 3.04* 2.79*  CALCIUM 10.6* 10.1 10.2  PHOS  --   --  2.9   Liver Function Tests:  Recent Labs Lab 04/09/14 1204 04/10/14 1012 04/12/14 0540  AST 15 45*  --   ALT 10 13  --   ALKPHOS 102 92  --   BILITOT 0.7 0.7  --   PROT 8.1 7.2  --   ALBUMIN 4.9 4.3 3.9   No results found for this basename: LIPASE, AMYLASE,  in the last 168 hours No results found for this basename: AMMONIA,  in the last 168 hours CBC:  Recent Labs Lab 04/09/14 1204 04/10/14 1012 04/11/14 0452  WBC 12.3* 8.0 7.7  NEUTROABS 9.8*  --   --   HGB 11.3* 8.8* 9.2*  HCT 34.0* 27.4* 29.5*  MCV 91.4 94.8 96.7  PLT 203 143* 146*   Cardiac Enzymes: No results found for this basename: CKTOTAL, CKMB, CKMBINDEX, TROPONINI,  in the last 168 hours CBG:  Recent Labs Lab 04/09/14 1211  GLUCAP 134*    Iron Studies: No results found for this basename: IRON, TIBC, TRANSFERRIN, FERRITIN,  in the last 72 hours Studies/Results: US Renal  04/11/2014   CLINICAL DATA:  Acute kidney injury and urinary tract infection  EXAM: RENAL/URINARY TRACT ULTRASOUND COMPLETE  COMPARISON:  None.  FINDINGS: Right Kidney:  Length: 10 cm. Echogenic cortex with abnormal cortical medullary differentiation. There is a 9 mm simple appearing cyst in the interpolar region. No hydronephrosis or evidence of solid mass.  Left Kidney:  Length: 10 cm. Echogenic cortex with abnormal cortical medullary differentiation. No hydronephrosis or evidence of mass.  Bladder:  Completely decompressed around a Foley catheter.  IMPRESSION: 1. No  hydronephrosis. 2. Chronic medical renal disease.   Electronically Signed   By: Jorje Guild M.D.   On: 04/11/2014 06:17   Dg Chest Port 1 View  04/11/2014   CLINICAL DATA:  Possible pneumonia.  EXAM: PORTABLE CHEST - 1 VIEW  COMPARISON:  Chest x-ray 04/09/2014.  FINDINGS: Status post median sternotomy. Epicardial wires are seen projecting over the right atrium. Abandoned wires projecting over the upper right hemithorax presumably from prior pacemaker. Lung volumes are low. There are some ill-defined bibasilar opacities favored to reflect some mild subsegmental atelectasis. No consolidative airspace disease. No pleural effusions. No pneumothorax. No evidence of pulmonary edema. Heart size is borderline enlarged. Upper mediastinal contours are unremarkable. Atherosclerosis in the thoracic aorta.  IMPRESSION: 1. Low lung volumes with probable bibasilar subsegmental atelectasis. 2. Atherosclerosis. 3. Postoperative changes and support apparatus, as above.   Electronically Signed   By: Vinnie Langton M.D.   On: 04/11/2014 11:24   Medications: Infusions: . sodium chloride 100 mL/hr at 04/11/14 2151    Scheduled Medications: . ALPRAZolam  1 mg Oral TID  . antiseptic oral rinse  15 mL Mouth Rinse q12n4p  . azithromycin  500 mg Oral Q24H  . cefTRIAXone (ROCEPHIN)  IV  1 g Intravenous Q24H  . chlorhexidine  15 mL Mouth Rinse BID  . clopidogrel  75 mg Oral q morning - 10a  . darbepoetin (ARANESP) injection - NON-DIALYSIS  100 mcg Subcutaneous Q Sat-1800  . FLUoxetine  10 mg Oral q morning - 10a  . heparin  5,000 Units Subcutaneous 3 times per day  . metoprolol succinate  25 mg Oral q morning - 10a  . pneumococcal 23 valent vaccine  0.5 mL Intramuscular Tomorrow-1000  . simvastatin  10 mg Oral QHS  . sodium bicarbonate  1,300 mg Oral BID  . sodium chloride  3 mL Intravenous Q12H  . tamsulosin  0.4 mg Oral q morning - 10a  . traZODone  100 mg Oral QHS    have reviewed scheduled and prn  medications.  Physical Exam: General: agitated, mittens- did eventually get a one word answer Heart: RRR Lungs: clear Abdomen: soft, non tender Extremities: no edema    04/12/2014,10:50 AM  LOS: 3 days

## 2014-04-13 LAB — RENAL FUNCTION PANEL
Albumin: 4 g/dL (ref 3.5–5.2)
BUN: 28 mg/dL — ABNORMAL HIGH (ref 6–23)
CO2: 17 mEq/L — ABNORMAL LOW (ref 19–32)
Calcium: 10.6 mg/dL — ABNORMAL HIGH (ref 8.4–10.5)
Chloride: 117 mEq/L — ABNORMAL HIGH (ref 96–112)
Creatinine, Ser: 2.7 mg/dL — ABNORMAL HIGH (ref 0.50–1.35)
GFR calc Af Amer: 27 mL/min — ABNORMAL LOW (ref >90)
GFR calc non Af Amer: 23 mL/min — ABNORMAL LOW (ref >90)
Glucose, Bld: 123 mg/dL — ABNORMAL HIGH (ref 70–99)
Phosphorus: 3.9 mg/dL (ref 2.3–4.6)
Potassium: 4.7 mEq/L (ref 3.7–5.3)
Sodium: 148 mEq/L — ABNORMAL HIGH (ref 137–147)

## 2014-04-13 LAB — PARATHYROID HORMONE, INTACT (NO CA)
PTH: 107.6 pg/mL — ABNORMAL HIGH (ref 14.0–72.0)
PTH: 69.8 pg/mL (ref 14.0–72.0)

## 2014-04-13 MED ORDER — POLYETHYLENE GLYCOL 3350 17 G PO PACK
17.0000 g | PACK | Freq: Every day | ORAL | Status: DC
  Administered 2014-04-13 – 2014-04-23 (×10): 17 g via ORAL
  Filled 2014-04-13 (×11): qty 1

## 2014-04-13 MED ORDER — SODIUM CHLORIDE 0.45 % IV SOLN
INTRAVENOUS | Status: DC
  Administered 2014-04-13: 100 mL/h via INTRAVENOUS
  Administered 2014-04-14 (×2): via INTRAVENOUS

## 2014-04-13 MED ORDER — LEVOFLOXACIN 500 MG PO TABS
500.0000 mg | ORAL_TABLET | ORAL | Status: AC
  Administered 2014-04-13 – 2014-04-15 (×2): 500 mg via ORAL
  Filled 2014-04-13 (×2): qty 1

## 2014-04-13 MED ORDER — OLANZAPINE 5 MG PO TBDP
5.0000 mg | ORAL_TABLET | Freq: Every day | ORAL | Status: DC
  Administered 2014-04-13 – 2014-04-15 (×3): 5 mg via ORAL
  Filled 2014-04-13 (×5): qty 1

## 2014-04-13 NOTE — Progress Notes (Signed)
Patient ID: Jeffrey Herring, male   DOB: 1951/09/27, 63 y.o.   MRN: [id number] S:non verbal O:BP 142/80  Pulse 69  Temp(Src) 97.8 F (36.6 C) (Oral)  Resp 20  Ht '5\' 10"'  (1.778 m)  Wt 68.221 kg (150 lb 6.4 oz)  BMI 21.58 kg/m2  SpO2 95%  Intake/Output Summary (Last 24 hours) at 04/13/14 1318 Last data filed at 04/13/14 1041  Gross per 24 hour  Intake    290 ml  Output   1425 ml  Net  -1135 ml   Intake/Output: I/O last 3 completed shifts: In: 290 [P.O.:240; IV Piggyback:50] Out: 2800 [Urine:2800]  Intake/Output this shift:  Total I/O In: -  Out: 325 [Urine:325] Weight change:  Gen:WD WM, delirious and not following commands CVS:no rub Resp:scattered rhonchi CNO:BSJGGE Ext:no edema   Recent Labs Lab 04/09/14 1204 04/10/14 1012 04/11/14 0452 04/12/14 0540 04/13/14 0634  NA 135* 136* 143 145 148*  K 4.6 5.0 5.1 4.4 4.7  CL 104 105 112 114* 117*  CO2 17* 12* 14* 15* 17*  GLUCOSE 125* 81 72 112* 123*  BUN 35* 31* 34* 30* 28*  CREATININE 3.12* 2.79* 3.04* 2.79* 2.70*  ALBUMIN 4.9 4.3  --  3.9 4.0  CALCIUM 11.1* 10.6* 10.1 10.2 10.6*  PHOS  --   --   --  2.9 3.9  AST 15 45*  --   --   --   ALT 10 13  --   --   --    Liver Function Tests:  Recent Labs Lab 04/09/14 1204 04/10/14 1012 04/12/14 0540 04/13/14 0634  AST 15 45*  --   --   ALT 10 13  --   --   ALKPHOS 102 92  --   --   BILITOT 0.7 0.7  --   --   PROT 8.1 7.2  --   --   ALBUMIN 4.9 4.3 3.9 4.0   No results found for this basename: LIPASE, AMYLASE,  in the last 168 hours No results found for this basename: AMMONIA,  in the last 168 hours CBC:  Recent Labs Lab 04/09/14 1204 04/10/14 1012 04/11/14 0452  WBC 12.3* 8.0 7.7  NEUTROABS 9.8*  --   --   HGB 11.3* 8.8* 9.2*  HCT 34.0* 27.4* 29.5*  MCV 91.4 94.8 96.7  PLT 203 143* 146*   Cardiac Enzymes: No results found for this basename: CKTOTAL, CKMB, CKMBINDEX, TROPONINI,  in the last 168 hours CBG:  Recent Labs Lab 04/09/14 1211   GLUCAP 134*    Iron Studies:  Recent Labs  04/12/14 0540  IRON 88  TIBC 227  FERRITIN 366*   Studies/Results: No results found. . ALPRAZolam  1 mg Oral TID  . antiseptic oral rinse  15 mL Mouth Rinse q12n4p  . chlorhexidine  15 mL Mouth Rinse BID  . clopidogrel  75 mg Oral q morning - 10a  . darbepoetin (ARANESP) injection - NON-DIALYSIS  100 mcg Subcutaneous Q Sat-1800  . FLUoxetine  10 mg Oral q morning - 10a  . heparin  5,000 Units Subcutaneous 3 times per day  . levofloxacin  500 mg Oral Q48H  . metoprolol succinate  25 mg Oral q morning - 10a  . pneumococcal 23 valent vaccine  0.5 mL Intramuscular Tomorrow-1000  . polyethylene glycol  17 g Oral Daily  . simvastatin  10 mg Oral QHS  . sodium bicarbonate  1,300 mg Oral BID  . sodium chloride  3 mL Intravenous Q12H  .  tamsulosin  0.4 mg Oral q morning - 10a  . traZODone  100 mg Oral QHS    BMET    Component Value Date/Time   NA 148* 04/13/2014 0634   K 4.7 04/13/2014 0634   CL 117* 04/13/2014 0634   CO2 17* 04/13/2014 0634   GLUCOSE 123* 04/13/2014 0634   BUN 28* 04/13/2014 0634   CREATININE 2.70* 04/13/2014 0634   CALCIUM 10.6* 04/13/2014 0634   GFRNONAA 23* 04/13/2014 0634   GFRAA 27* 04/13/2014 0634   CBC    Component Value Date/Time   WBC 7.7 04/11/2014 0452   RBC 3.05* 04/11/2014 0452   HGB 9.2* 04/11/2014 0452   HCT 29.5* 04/11/2014 0452   PLT 146* 04/11/2014 0452   MCV 96.7 04/11/2014 0452   MCH 30.2 04/11/2014 0452   MCHC 31.2 04/11/2014 0452   RDW 14.3 04/11/2014 0452   LYMPHSABS 0.9 04/09/2014 1204   MONOABS 1.4* 04/09/2014 1204   EOSABS 0.2 04/09/2014 1204   BASOSABS 0.0 04/09/2014 1204      Assessment/Plan:  1. AKI/CKD high suspicion for lithium toxicity as well as hypercalcemia and BOO.  Agree with holding lithium and would not resume.  Scr improved.  Cont to follow 2. Hyperkalemia- will likely need boluses of free water if not taking po or with D5W IV  3. Met acidosis- improved with bicarb, cont to  replete and follow 4. HTN- stable 5. Psych- off lithium, nonverbal and does not follow commands.  recs per Psych 6. ID: CAP- on abx  COLADONATO,JOSEPH A

## 2014-04-13 NOTE — Consult Note (Signed)
[facility] Consult   Reason for Consult:  Bipolar disorder, lithium toxicity and AMS Referring Physician:  Dr.Chiu  Jeffrey Herring is an 63 y.o. male. Total Time spent with patient: 45 minutes  Assessment: AXIS I:  Bipolar, Depressed AXIS II:  Deferred AXIS III:   Past Medical History  Diagnosis Date  . Hyperlipemia   . Pacemaker   . Arrhythmia   . Diabetes insipidus   . History of kidney stones   . History of DVT (deep vein thrombosis)   . History of pulmonary embolism    AXIS IV:  other psychosocial or environmental problems, problems related to social environment and problems with primary support group AXIS V:  31-40 impairment in reality testing  Plan:  Supportive therapy provided about ongoing stressors. Discontinue lithium if it was not done Discontinue Fluoxetine - not helpful  Start Zyprexa Zydis 5 mg PO Qhs and may adjust if tolerated May use Haldol and Ativan as needed for agitation and aggressive behaviors Appreciate psychiatric consultation and followup as clinically required  Subjective:   Jeffrey Herring is a 63 y.o. male patient admitted with AMS.  HPI: Patient was seen and chart reviewed. Patient was not able to contribute to the evaluation secondary to being sedated with medication. Patient has renal sonogram planned for today. Staff RN reported patient has been irritable, restless and poorly cooperative.   Interval History: Patient is seen for psychiatric consultation follow up. Case discussed with Dr. Adele Schilder who is aware of the patient condition and stated patient should not be on Lithium and should give a trial of Lamictal and/or ZZyprexa due to his renal condition. Patient and his family believe he does better with lithium but it is hard to keep him with therapeutic level without falling into toxic levels. Patient was unable to contribute verbally, awake, opened his eyes but unable to follow the commands or respond verbally.   Medical  history: Jeffrey Herring is a 64 y.o. male With a hx of bipolar disorder, DI, hx pacemaker placement who presents to the ED with increased confusion and agitation over the past week prior to admi. In the ED, the patient was found to have a low grade temp of 100F with CXR findings suggestive of R base infiltrate with WBC of over 12K and Cr of 3.12 (previous of over 2). The patient was started on empiric azithromycin and rocephin and the hospitalist service consulted for consideration for admission.   Review of Systems:  Per above, the remainder of the 10pt ros reviewed and are neg   HPI Elements:   Location:  Lithium toxicity. Quality:  Sedation and confusion. Severity:  Acute. Timing:  Unknown causes of lithium toxicity.  Past Psychiatric History: Past Medical History  Diagnosis Date  . Hyperlipemia   . Pacemaker   . Arrhythmia   . Diabetes insipidus   . History of kidney stones   . History of DVT (deep vein thrombosis)   . History of pulmonary embolism     reports that he has been smoking Cigars.  He has never used smokeless tobacco. He reports that he does not drink alcohol or use illicit drugs. Family History  Problem Relation Age of Onset  . Depression Mother      Living Arrangements: Spouse/significant other   Abuse/Neglect [facility]) Physical Abuse: Denies Verbal Abuse: Denies Sexual Abuse: Denies Allergies:   Allergies  Allergen Reactions  . Aspirin Hives  . Codeine Nausea And Vomiting  . Demerol [Meperidine] Nausea And  Vomiting    ACT Assessment Complete:  NO Objective: Blood pressure 142/80, pulse 69, temperature 97.8 F (36.6 C), temperature source Oral, resp. rate 20, height '5\' 10"'  (1.778 m), weight 68.221 kg (150 lb 6.4 oz), SpO2 95.00%.Body mass index is 21.58 kg/(m^2). Results for orders placed during the hospital encounter of 04/09/14 (from the past 72 hour(s))  BASIC METABOLIC PANEL     Status: Abnormal   Collection Time    04/11/14  4:52 AM      Result  Value Ref Range   Sodium 143  137 - 147 mEq/L   Comment: DELTA CHECK NOTED   Potassium 5.1  3.7 - 5.3 mEq/L   Chloride 112  96 - 112 mEq/L   CO2 14 (*) 19 - 32 mEq/L   Glucose, Bld 72  70 - 99 mg/dL   BUN 34 (*) 6 - 23 mg/dL   Creatinine, Ser 3.04 (*) 0.50 - 1.35 mg/dL   Calcium 10.1  8.4 - 10.5 mg/dL   GFR calc non Af Amer 20 (*) >90 mL/min   GFR calc Af Amer 23 (*) >90 mL/min   Comment: (NOTE)     The eGFR has been calculated using the CKD EPI equation.     This calculation has not been validated in all clinical situations.     eGFR's persistently <90 mL/min signify possible Chronic Kidney     Disease.  CBC     Status: Abnormal   Collection Time    04/11/14  4:52 AM      Result Value Ref Range   WBC 7.7  4.0 - 10.5 K/uL   RBC 3.05 (*) 4.22 - 5.81 MIL/uL   Hemoglobin 9.2 (*) 13.0 - 17.0 g/dL   HCT 29.5 (*) 39.0 - 52.0 %   MCV 96.7  78.0 - 100.0 fL   MCH 30.2  26.0 - 34.0 pg   MCHC 31.2  30.0 - 36.0 g/dL   RDW 14.3  11.5 - 15.5 %   Platelets 146 (*) 150 - 400 K/uL  IRON AND TIBC     Status: None   Collection Time    04/12/14  5:40 AM      Result Value Ref Range   Iron 88  42 - 135 ug/dL   TIBC 227  215 - 435 ug/dL   Saturation Ratios 39  20 - 55 %   UIBC 139  125 - 400 ug/dL   Comment: Performed at [place]     Status: Abnormal   Collection Time    04/12/14  5:40 AM      Result Value Ref Range   Ferritin 366 (*) 22 - 322 ng/mL   Comment: Performed at [place], INTACT (NO CA)     Status: None   Collection Time    04/12/14  5:40 AM      Result Value Ref Range   PTH 69.8  14.0 - 72.0 pg/mL   Comment: Performed at [facility]  RENAL FUNCTION PANEL     Status: Abnormal   Collection Time    04/12/14  5:40 AM      Result Value Ref Range   Sodium 145  137 - 147 mEq/L   Potassium 4.4  3.7 - 5.3 mEq/L   Chloride 114 (*) 96 - 112 mEq/L   CO2 15 (*) 19 - 32 mEq/L   Glucose, Bld 112 (*) 70 - 99 mg/dL   BUN  30 (*)  6 - 23 mg/dL   Creatinine, Ser 2.79 (*) 0.50 - 1.35 mg/dL   Calcium 10.2  8.4 - 10.5 mg/dL   Phosphorus 2.9  2.3 - 4.6 mg/dL   Albumin 3.9  3.5 - 5.2 g/dL   GFR calc non Af Amer 22 (*) >90 mL/min   GFR calc Af Amer 26 (*) >90 mL/min   Comment: (NOTE)     The eGFR has been calculated using the CKD EPI equation.     This calculation has not been validated in all clinical situations.     eGFR's persistently <90 mL/min signify possible Chronic Kidney     Disease.  LITHIUM LEVEL     Status: None   Collection Time    04/12/14  5:40 AM      Result Value Ref Range   Lithium Lvl 1.22  0.80 - 1.40 mEq/L  RENAL FUNCTION PANEL     Status: Abnormal   Collection Time    04/13/14  6:34 AM      Result Value Ref Range   Sodium 148 (*) 137 - 147 mEq/L   Potassium 4.7  3.7 - 5.3 mEq/L   Chloride 117 (*) 96 - 112 mEq/L   CO2 17 (*) 19 - 32 mEq/L   Glucose, Bld 123 (*) 70 - 99 mg/dL   BUN 28 (*) 6 - 23 mg/dL   Creatinine, Ser 2.70 (*) 0.50 - 1.35 mg/dL   Calcium 10.6 (*) 8.4 - 10.5 mg/dL   Phosphorus 3.9  2.3 - 4.6 mg/dL   Albumin 4.0  3.5 - 5.2 g/dL   GFR calc non Af Amer 23 (*) >90 mL/min   GFR calc Af Amer 27 (*) >90 mL/min   Comment: (NOTE)     The eGFR has been calculated using the CKD EPI equation.     This calculation has not been validated in all clinical situations.     eGFR's persistently <90 mL/min signify possible Chronic Kidney     Disease.   Labs are reviewed and are pertinent for lithium toxicity and increased bun and cr.  Current Facility-Administered Medications  Medication Dose Route Frequency Weronika Birch Last Rate Last Dose  . 0.45 % sodium chloride infusion   Intravenous Continuous Donetta Potts, MD      . acetaminophen (TYLENOL) tablet 650 mg  650 mg Oral Q6H PRN Donne Hazel, MD       Or  . acetaminophen (TYLENOL) suppository 650 mg  650 mg Rectal Q6H PRN Donne Hazel, MD      . ALPRAZolam [name]) tablet 1 mg  1 mg Oral TID Donne Hazel, MD   1 mg at  04/13/14 1001  . antiseptic oral rinse (BIOTENE) solution 15 mL  15 mL Mouth Rinse q12n4p Verlee Monte, MD   15 mL at 04/13/14 1233  . chlorhexidine (PERIDEX) 0.12 % solution 15 mL  15 mL Mouth Rinse BID Verlee Monte, MD   15 mL at 04/13/14 0755  . clopidogrel (PLAVIX) tablet 75 mg  75 mg Oral q morning - 10a Donne Hazel, MD   75 mg at 04/13/14 1001  . darbepoetin (ARANESP) injection 100 mcg  100 mcg Subcutaneous Q Sat-1800 Louis Meckel, MD   100 mcg at 04/11/14 1845  . FLUoxetine (PROZAC) capsule 10 mg  10 mg Oral q morning - 10a Donne Hazel, MD   10 mg at 04/13/14 1001  . haloperidol lactate (HALDOL) injection 5 mg  5 mg Intravenous Q6H PRN  Donne Hazel, MD   5 mg at 04/12/14 1756  . heparin injection 5,000 Units  5,000 Units Subcutaneous 3 times per day Donne Hazel, MD   5,000 Units at 04/13/14 0536  . levofloxacin (LEVAQUIN) tablet 500 mg  500 mg Oral Q48H Verlee Monte, MD   500 mg at 04/13/14 1233  . LORazepam (ATIVAN) injection 1 mg  1 mg Intravenous Q6H PRN Verlee Monte, MD   1 mg at 04/12/14 2252  . metoprolol succinate (TOPROL-XL) 24 hr tablet 25 mg  25 mg Oral q morning - 10a Donne Hazel, MD   25 mg at 04/13/14 1001  . ondansetron (ZOFRAN) tablet 4 mg  4 mg Oral Q6H PRN Donne Hazel, MD       Or  . ondansetron [facility]) injection 4 mg  4 mg Intravenous Q6H PRN Donne Hazel, MD      . pneumococcal 23 valent vaccine (PNU-IMMUNE) injection 0.5 mL  0.5 mL Intramuscular Tomorrow-1000 Mutaz Elmahi, MD      . polyethylene glycol (MIRALAX / GLYCOLAX) packet 17 g  17 g Oral Daily Verlee Monte, MD   17 g at 04/13/14 1240  . simvastatin (ZOCOR) tablet 10 mg  10 mg Oral QHS Donne Hazel, MD   10 mg at 04/12/14 2245  . sodium bicarbonate tablet 1,300 mg  1,300 mg Oral BID Louis Meckel, MD   1,300 mg at 04/13/14 1002  . sodium chloride 0.9 % injection 3 mL  3 mL Intravenous Q12H Donne Hazel, MD   3 mL at 04/12/14 2245  . tamsulosin (FLOMAX) capsule 0.4 mg  0.4  mg Oral q morning - 10a Donne Hazel, MD   0.4 mg at 04/13/14 1002  . traZODone (DESYREL) tablet 100 mg  100 mg Oral QHS Donne Hazel, MD   100 mg at 04/12/14 2244    Psychiatric Specialty Exam: Physical Exam  ROS  Blood pressure 142/80, pulse 69, temperature 97.8 F (36.6 C), temperature source Oral, resp. rate 20, height '5\' 10"'  (1.778 m), weight 68.221 kg (150 lb 6.4 oz), SpO2 95.00%.Body mass index is 21.58 kg/(m^2).  General Appearance: Guarded  Eye Contact::  Minimal  Speech:  NA  Volume:  Decreased  Mood:  NA  Affect:  NA  Thought Process:  NA  Orientation:  NA  Thought Content:  NA  Suicidal Thoughts:  No  Homicidal Thoughts:  No  Memory:  NA  Judgement:  NA  Insight:  NA  Psychomotor Activity:  NA  Concentration:  NA  Recall:  NA  Fund of Knowledge:NA  Language: NA  Akathisia:  NA  Handed:  Right  AIMS (if indicated):     Assets:  Financial Resources/Insurance Housing Intimacy Leisure Time Resilience Social Support Talents/Skills  Sleep:      Musculoskeletal: Strength & Muscle Tone: decreased Gait & Station: unable to stand Patient leans: N/A  Treatment Plan Summary: Daily contact with patient to assess and evaluate symptoms and progress in treatment Medication management  Jeffrey Herring,Jeffrey R. 04/13/2014 1:34 PM

## 2014-04-13 NOTE — Progress Notes (Signed)
[facility] PROGRESS NOTE  CLEDITH SEIN [id number] DOB: 19-Jul-1951 DOA: 04/09/2014 PCP: Ashok Norris, MD   HPI/Subjective: Patient admitted on 04/09/2014 presenting with increased confusion. Has h/o bipolar disorder, had a supratherapeutic Lithium level of 2.16.   Seen with sitter at bedside, lethargic this morning.  Assessment/Plan: 1. Acute encephalopathy -Likely multifactorial, suspect lithium toxicity, underlying infections process from UTI and possibly CAP, urinary retention.  -He seemed less agitated once foley was placed  -Continue supportive care, emperic antibiotic therapy, IV fluids. -Lethargic today, did not get any Haldol or Ativan since yesterday. -Psych reconsulted for medication recommendation on discharge and discharge disposition ([facility] versus home).  2.  Urinary Retention -Over 800 mL's of urine drained after the placement of foley.  -Will check a renal U/S -Continue AB therapy for possible UTI  3.  Acute Kidney Injury -Initial labs showing a Creatinine of 3.12, creatinine today is 2.70. -Nephrology consulted -U/S showed clinical renal disease, no evidence of obstruction  4.  Possible CAP -Initial CXR showed Right base atelectasis and/or PNA -Was started on emperic AB therapy with Rocephin and Azithromycin -Antibiotics switched to oral Levaquin.  5.  History of Bipolar Disorder -Presenting with Lithium toxicity -Psychiatry following, recommended Ativan and Haldol as needed.    Code Status: Full Code Family Communication: Spoke with his wife Disposition Plan: Follow up on renal US, continue AB therapy, supportive care   Consultants:  Nephrology   Antibiotics:  Ceftriaxone  Azithromycin   Objective: Filed Vitals:   04/13/14 0940  BP: 142/80  Pulse: 69  Temp: 97.8 F (36.6 C)  Resp: 20    Intake/Output Summary (Last 24 hours) at 04/13/14 1212 Last data filed at 04/13/14 1041  Gross per 24 hour  Intake    290 ml   Output   1425 ml  Net  -1135 ml   Filed Weights   04/09/14 1820  Weight: 68.221 kg (150 lb 6.4 oz)    Exam:   General:  Patient is agitated, thrashing about, significantly improved after insertion of foley  Cardiovascular: Tachycardic, regular rate and rhythm, normal S1S2  Respiratory: Clear to auscultation, normal inspiratory effort  Abdomen: Having mild generalized tenderness to palpation  Musculoskeletal: no edema  Data Reviewed: Basic Metabolic Panel:  Recent Labs Lab 04/09/14 1204 04/10/14 1012 04/11/14 0452 04/12/14 0540 04/13/14 0634  NA 135* 136* 143 145 148*  K 4.6 5.0 5.1 4.4 4.7  CL 104 105 112 114* 117*  CO2 17* 12* 14* 15* 17*  GLUCOSE 125* 81 72 112* 123*  BUN 35* 31* 34* 30* 28*  CREATININE 3.12* 2.79* 3.04* 2.79* 2.70*  CALCIUM 11.1* 10.6* 10.1 10.2 10.6*  PHOS  --   --   --  2.9 3.9   Liver Function Tests:  Recent Labs Lab 04/09/14 1204 04/10/14 1012 04/12/14 0540 04/13/14 0634  AST 15 45*  --   --   ALT 10 13  --   --   ALKPHOS 102 92  --   --   BILITOT 0.7 0.7  --   --   PROT 8.1 7.2  --   --   ALBUMIN 4.9 4.3 3.9 4.0   No results found for this basename: LIPASE, AMYLASE,  in the last 168 hours No results found for this basename: AMMONIA,  in the last 168 hours CBC:  Recent Labs Lab 04/09/14 1204 04/10/14 1012 04/11/14 0452  WBC 12.3* 8.0 7.7  NEUTROABS 9.8*  --   --   HGB 11.3* 8.8* 9.2*  HCT 34.0* 27.4* 29.5*  MCV 91.4 94.8 96.7  PLT 203 143* 146*   Cardiac Enzymes: No results found for this basename: CKTOTAL, CKMB, CKMBINDEX, TROPONINI,  in the last 168 hours BNP (last 3 results) No results found for this basename: PROBNP,  in the last 8760 hours CBG:  Recent Labs Lab 04/09/14 1211  GLUCAP 134*    Recent Results (from the past 240 hour(s))  URINE CULTURE     Status: None   Collection Time    04/09/14 12:09 PM      Result Value Ref Range Status   Specimen Description URINE, CLEAN CATCH   Final   Special  Requests NONE   Final   Culture  Setup Time     Final   Value: 04/09/2014 19:48     Performed at [facility]     Final   Value: NO GROWTH     Performed at [facility]   Culture     Final   Value: NO GROWTH     Performed at [facility]   Report Status 04/10/2014 FINAL   Final  CULTURE, BLOOD (ROUTINE X 2)     Status: None   Collection Time    04/09/14  3:01 PM      Result Value Ref Range Status   Specimen Description BLOOD FOREARM   Final   Special Requests BOTTLES DRAWN AEROBIC AND ANAEROBIC 6ML   Final   Culture  Setup Time     Final   Value: 04/09/2014 19:05     Performed at [facility]   Culture     Final   Value:        BLOOD CULTURE RECEIVED NO GROWTH TO DATE CULTURE WILL BE HELD FOR 5 DAYS BEFORE ISSUING A FINAL NEGATIVE REPORT     Performed at [facility]   Report Status PENDING   Incomplete  CULTURE, BLOOD (ROUTINE X 2)     Status: None   Collection Time    04/09/14  3:01 PM      Result Value Ref Range Status   Specimen Description BLOOD BLOOD LEFT FOREARM   Final   Special Requests     Final   Value: BOTTLES DRAWN AEROBIC AND ANAEROBIC 6ML POST ANTIBIOTICS   Culture  Setup Time     Final   Value: 04/09/2014 19:05     Performed at [facility]   Culture     Final   Value:        BLOOD CULTURE RECEIVED NO GROWTH TO DATE CULTURE WILL BE HELD FOR 5 DAYS BEFORE ISSUING A FINAL NEGATIVE REPORT     Performed at [facility]   Report Status PENDING   Incomplete     Studies: No results found.  Scheduled Meds: . ALPRAZolam  1 mg Oral TID  . antiseptic oral rinse  15 mL Mouth Rinse q12n4p  . chlorhexidine  15 mL Mouth Rinse BID  . clopidogrel  75 mg Oral q morning - 10a  . darbepoetin (ARANESP) injection - NON-DIALYSIS  100 mcg Subcutaneous Q Sat-1800  . FLUoxetine  10 mg Oral q morning - 10a  . heparin  5,000 Units Subcutaneous 3 times per day  . levofloxacin  500 mg Oral Q48H  . metoprolol  succinate  25 mg Oral q morning - 10a  . pneumococcal 23 valent vaccine  0.5 mL Intramuscular Tomorrow-1000  . simvastatin  10 mg Oral  QHS  . sodium bicarbonate  1,300 mg Oral BID  . sodium chloride  3 mL Intravenous Q12H  . tamsulosin  0.4 mg Oral q morning - 10a  . traZODone  100 mg Oral QHS   Continuous Infusions: . sodium chloride 100 mL/hr at 04/11/14 2151    Principal Problem:   Bipolar 1 disorder Active Problems:   Inguinal hernia   Cardiac pacemaker in situ   Acute renal failure   Leukocytosis   Pneumonia   Sepsis due to pneumonia   Lithium toxicity    Time spent: 35 min    [facility] Pager [phone]. If 7PM-7AM, please contact night-coverage at [email or web address], password [facility] 04/13/2014, 12:12 PM  LOS: 4 days

## 2014-04-14 ENCOUNTER — Inpatient Hospital Stay (HOSPITAL_COMMUNITY): Payer: Medicare Other

## 2014-04-14 DIAGNOSIS — D72829 Elevated white blood cell count, unspecified: Secondary | ICD-10-CM

## 2014-04-14 DIAGNOSIS — Y33XXXS Other specified events, undetermined intent, sequela: Secondary | ICD-10-CM

## 2014-04-14 DIAGNOSIS — T6591XS Toxic effect of unspecified substance, accidental (unintentional), sequela: Secondary | ICD-10-CM

## 2014-04-14 LAB — RENAL FUNCTION PANEL
Albumin: 3.1 g/dL — ABNORMAL LOW (ref 3.5–5.2)
BUN: 31 mg/dL — ABNORMAL HIGH (ref 6–23)
CO2: 16 mEq/L — ABNORMAL LOW (ref 19–32)
Calcium: 10.1 mg/dL (ref 8.4–10.5)
Chloride: 119 mEq/L — ABNORMAL HIGH (ref 96–112)
Creatinine, Ser: 2.67 mg/dL — ABNORMAL HIGH (ref 0.50–1.35)
GFR calc Af Amer: 27 mL/min — ABNORMAL LOW (ref >90)
GFR calc non Af Amer: 24 mL/min — ABNORMAL LOW (ref >90)
Glucose, Bld: 108 mg/dL — ABNORMAL HIGH (ref 70–99)
Phosphorus: 3.7 mg/dL (ref 2.3–4.6)
Potassium: 4.3 mEq/L (ref 3.7–5.3)
Sodium: 149 mEq/L — ABNORMAL HIGH (ref 137–147)

## 2014-04-14 MED ORDER — STERILE WATER FOR INJECTION IV SOLN
INTRAVENOUS | Status: DC
  Administered 2014-04-14 – 2014-04-16 (×5): via INTRAVENOUS
  Filled 2014-04-14 (×10): qty 9.7

## 2014-04-14 NOTE — Consult Note (Signed)
[place] Psychiatry Consult   Reason for Consult:  Bipolar disorder, lithium toxicity and AMS Referring Physician:  Dr.Chiu  Jeffrey Herring is an 63 y.o. male. Total Time spent with patient: 45 minutes  Assessment: AXIS I:  Bipolar, Depressed AXIS II:  Deferred AXIS III:   Past Medical History  Diagnosis Date  . Hyperlipemia   . Pacemaker   . Arrhythmia   . Diabetes insipidus   . History of kidney stones   . History of DVT (deep vein thrombosis)   . History of pulmonary embolism    AXIS IV:  other psychosocial or environmental problems, problems related to social environment and problems with primary support group AXIS V:  31-40 impairment in reality testing  Plan:  Supportive therapy provided about ongoing stressors. Discontinue lithium if it was not done Discontinue Fluoxetine - not helpful  Start Zyprexa Zydis 5 mg PO Qhs and may adjust if tolerated May use Haldol and Ativan as needed for agitation and aggressive behaviors Appreciate psychiatric consultation and followup as clinically required  Subjective:   Jeffrey Herring is a 63 y.o. male patient admitted with AMS.  HPI: Patient was seen and chart reviewed. Patient was not able to contribute to the evaluation secondary to being sedated with medication. Patient has renal sonogram planned for today. Staff RN reported patient has been irritable, restless and poorly cooperative.   Interval History: Patient is seen for psychiatric consultation follow up. Patient is awake but not alert and oriented. He has making verbal sounds but does not make sense at this time. Patient found sedated this morning as per report. Patient has safety sitter at next to his bed.  Will follow up for medical clearance and in patient hospitalization needs.  Medical history: Jeffrey Herring is a 63 y.o. male With a hx of bipolar disorder, DI, hx pacemaker placement who presents to the ED with increased confusion and agitation over the past  week prior to admi. In the ED, the patient was found to have a low grade temp of 100F with CXR findings suggestive of R base infiltrate with WBC of over 12K and Cr of 3.12 (previous of over 2). The patient was started on empiric azithromycin and rocephin and the hospitalist service consulted for consideration for admission.   Review of Systems:  Per above, the remainder of the 10pt ros reviewed and are neg   HPI Elements:   Location:  Lithium toxicity. Quality:  Sedation and confusion. Severity:  Acute. Timing:  Unknown causes of lithium toxicity.  Past Psychiatric History: Past Medical History  Diagnosis Date  . Hyperlipemia   . Pacemaker   . Arrhythmia   . Diabetes insipidus   . History of kidney stones   . History of DVT (deep vein thrombosis)   . History of pulmonary embolism     reports that he has been smoking Cigars.  He has never used smokeless tobacco. He reports that he does not drink alcohol or use illicit drugs. Family History  Problem Relation Age of Onset  . Depression Mother      Living Arrangements: Spouse/significant other   Abuse/Neglect [facility]) Physical Abuse: Denies Verbal Abuse: Denies Sexual Abuse: Denies Allergies:   Allergies  Allergen Reactions  . Aspirin Hives  . Codeine Nausea And Vomiting  . Demerol [Meperidine] Nausea And Vomiting    ACT Assessment Complete:  NO Objective: Blood pressure 126/72, pulse 60, temperature 97.7 F (36.5 C), temperature source Axillary, resp. rate 18, height '5\' 10"'  (  1.778 m), weight 68.221 kg (150 lb 6.4 oz), SpO2 96.00%.Body mass index is 21.58 kg/(m^2). Results for orders placed during the hospital encounter of 04/09/14 (from the past 72 hour(s))  IRON AND TIBC     Status: None   Collection Time    04/12/14  5:40 AM      Result Value Ref Range   Iron 88  42 - 135 ug/dL   TIBC 227  215 - 435 ug/dL   Saturation Ratios 39  20 - 55 %   UIBC 139  125 - 400 ug/dL   Comment: Performed at [place]     Status: Abnormal   Collection Time    04/12/14  5:40 AM      Result Value Ref Range   Ferritin 366 (*) 22 - 322 ng/mL   Comment: Performed at [place], INTACT (NO CA)     Status: None   Collection Time    04/12/14  5:40 AM      Result Value Ref Range   PTH 69.8  14.0 - 72.0 pg/mL   Comment: Performed at [facility]  RENAL FUNCTION PANEL     Status: Abnormal   Collection Time    04/12/14  5:40 AM      Result Value Ref Range   Sodium 145  137 - 147 mEq/L   Potassium 4.4  3.7 - 5.3 mEq/L   Chloride 114 (*) 96 - 112 mEq/L   CO2 15 (*) 19 - 32 mEq/L   Glucose, Bld 112 (*) 70 - 99 mg/dL   BUN 30 (*) 6 - 23 mg/dL   Creatinine, Ser 2.79 (*) 0.50 - 1.35 mg/dL   Calcium 10.2  8.4 - 10.5 mg/dL   Phosphorus 2.9  2.3 - 4.6 mg/dL   Albumin 3.9  3.5 - 5.2 g/dL   GFR calc non Af Amer 22 (*) >90 mL/min   GFR calc Af Amer 26 (*) >90 mL/min   Comment: (NOTE)     The eGFR has been calculated using the CKD EPI equation.     This calculation has not been validated in all clinical situations.     eGFR's persistently <90 mL/min signify possible Chronic Kidney     Disease.  LITHIUM LEVEL     Status: None   Collection Time    04/12/14  5:40 AM      Result Value Ref Range   Lithium Lvl 1.22  0.80 - 1.40 mEq/L  RENAL FUNCTION PANEL     Status: Abnormal   Collection Time    04/13/14  6:34 AM      Result Value Ref Range   Sodium 148 (*) 137 - 147 mEq/L   Potassium 4.7  3.7 - 5.3 mEq/L   Chloride 117 (*) 96 - 112 mEq/L   CO2 17 (*) 19 - 32 mEq/L   Glucose, Bld 123 (*) 70 - 99 mg/dL   BUN 28 (*) 6 - 23 mg/dL   Creatinine, Ser 2.70 (*) 0.50 - 1.35 mg/dL   Calcium 10.6 (*) 8.4 - 10.5 mg/dL   Phosphorus 3.9  2.3 - 4.6 mg/dL   Albumin 4.0  3.5 - 5.2 g/dL   GFR calc non Af Amer 23 (*) >90 mL/min   GFR calc Af Amer 27 (*) >90 mL/min   Comment: (NOTE)     The eGFR has been calculated using the CKD EPI equation.     This calculation has  not  been validated in all clinical situations.     eGFR's persistently <90 mL/min signify possible Chronic Kidney     Disease.  RENAL FUNCTION PANEL     Status: Abnormal   Collection Time    04/14/14  5:07 AM      Result Value Ref Range   Sodium 149 (*) 137 - 147 mEq/L   Potassium 4.3  3.7 - 5.3 mEq/L   Chloride 119 (*) 96 - 112 mEq/L   CO2 16 (*) 19 - 32 mEq/L   Glucose, Bld 108 (*) 70 - 99 mg/dL   BUN 31 (*) 6 - 23 mg/dL   Creatinine, Ser 2.67 (*) 0.50 - 1.35 mg/dL   Calcium 10.1  8.4 - 10.5 mg/dL   Phosphorus 3.7  2.3 - 4.6 mg/dL   Albumin 3.1 (*) 3.5 - 5.2 g/dL   GFR calc non Af Amer 24 (*) >90 mL/min   GFR calc Af Amer 27 (*) >90 mL/min   Comment: (NOTE)     The eGFR has been calculated using the CKD EPI equation.     This calculation has not been validated in all clinical situations.     eGFR's persistently <90 mL/min signify possible Chronic Kidney     Disease.   Labs are reviewed and are pertinent for lithium toxicity and increased bun and cr.  Current Facility-Administered Medications  Medication Dose Route Frequency Provider Last Rate Last Dose  . 0.45 % sodium chloride infusion   Intravenous Continuous Donetta Potts, MD 100 mL/hr at 04/14/14 1103    . acetaminophen (TYLENOL) tablet 650 mg  650 mg Oral Q6H PRN Donne Hazel, MD       Or  . acetaminophen (TYLENOL) suppository 650 mg  650 mg Rectal Q6H PRN Donne Hazel, MD      . ALPRAZolam [name]) tablet 1 mg  1 mg Oral TID Donne Hazel, MD   1 mg at 04/14/14 1011  . antiseptic oral rinse (BIOTENE) solution 15 mL  15 mL Mouth Rinse q12n4p Verlee Monte, MD   15 mL at 04/14/14 1200  . chlorhexidine (PERIDEX) 0.12 % solution 15 mL  15 mL Mouth Rinse BID Verlee Monte, MD   15 mL at 04/14/14 0807  . clopidogrel (PLAVIX) tablet 75 mg  75 mg Oral q morning - 10a Donne Hazel, MD   75 mg at 04/14/14 1012  . darbepoetin (ARANESP) injection 100 mcg  100 mcg Subcutaneous Q Sat-1800 Louis Meckel, MD   100 mcg at  04/11/14 1845  . haloperidol lactate (HALDOL) injection 5 mg  5 mg Intravenous Q6H PRN Donne Hazel, MD   5 mg at 04/12/14 1756  . heparin injection 5,000 Units  5,000 Units Subcutaneous 3 times per day Donne Hazel, MD   5,000 Units at 04/14/14 [phone]  . levofloxacin (LEVAQUIN) tablet 500 mg  500 mg Oral Q48H Verlee Monte, MD   500 mg at 04/13/14 1233  . LORazepam (ATIVAN) injection 1 mg  1 mg Intravenous Q6H PRN Verlee Monte, MD   1 mg at 04/12/14 2252  . metoprolol succinate (TOPROL-XL) 24 hr tablet 25 mg  25 mg Oral q morning - 10a Donne Hazel, MD   25 mg at 04/14/14 1011  . OLANZapine zydis (ZYPREXA) disintegrating tablet 5 mg  5 mg Oral QHS Durward Parcel, MD   5 mg at 04/13/14 2304  . ondansetron (ZOFRAN) tablet 4 mg  4 mg Oral Q6H PRN  Donne Hazel, MD       Or  . ondansetron [facility]) injection 4 mg  4 mg Intravenous Q6H PRN Donne Hazel, MD      . pneumococcal 23 valent vaccine (PNU-IMMUNE) injection 0.5 mL  0.5 mL Intramuscular Tomorrow-1000 Verlee Monte, MD      . polyethylene glycol (MIRALAX / GLYCOLAX) packet 17 g  17 g Oral Daily Verlee Monte, MD   17 g at 04/14/14 1000  . simvastatin (ZOCOR) tablet 10 mg  10 mg Oral QHS Donne Hazel, MD   10 mg at 04/13/14 2237  . sodium bicarbonate tablet 1,300 mg  1,300 mg Oral BID Louis Meckel, MD   1,300 mg at 04/14/14 1011  . sodium chloride 0.9 % injection 3 mL  3 mL Intravenous Q12H Donne Hazel, MD   3 mL at 04/14/14 1018  . tamsulosin (FLOMAX) capsule 0.4 mg  0.4 mg Oral q morning - 10a Donne Hazel, MD   0.4 mg at 04/14/14 1011  . traZODone (DESYREL) tablet 100 mg  100 mg Oral QHS Donne Hazel, MD   100 mg at 04/13/14 2236    Psychiatric Specialty Exam: Physical Exam  ROS  Blood pressure 126/72, pulse 60, temperature 97.7 F (36.5 C), temperature source Axillary, resp. rate 18, height '5\' 10"'  (1.778 m), weight 68.221 kg (150 lb 6.4 oz), SpO2 96.00%.Body mass index is 21.58 kg/(m^2).  General  Appearance: Guarded  Eye Contact::  Minimal  Speech:  NA  Volume:  Decreased  Mood:  NA  Affect:  NA  Thought Process:  NA  Orientation:  NA  Thought Content:  NA  Suicidal Thoughts:  No  Homicidal Thoughts:  No  Memory:  NA  Judgement:  NA  Insight:  NA  Psychomotor Activity:  NA  Concentration:  NA  Recall:  NA  Fund of Knowledge:NA  Language: NA  Akathisia:  NA  Handed:  Right  AIMS (if indicated):     Assets:  Financial Resources/Insurance Housing Intimacy Leisure Time Resilience Social Support Talents/Skills  Sleep:      Musculoskeletal: Strength & Muscle Tone: decreased Gait & Station: unable to stand Patient leans: N/A  Treatment Plan Summary: Daily contact with patient to assess and evaluate symptoms and progress in treatment Medication management  Lyndsie Wallman,JANARDHAHA R. 04/14/2014 12:54 PM

## 2014-04-14 NOTE — Progress Notes (Addendum)
[facility] PROGRESS NOTE  Jeffrey Herring [id number] DOB: 08/15/1951 DOA: 04/09/2014 PCP: Ashok Norris, MD   Subjective: Seen with sitter at bedside, lethargic this morning.  Interim history: Patient is a 63 year old with history of bipolar disorder, presented to [facility] emergency with increased confusion and agitation, he also had low-grade fever and acute renal failure with creatinine of 3.1. Chest x-ray showed he might have community-acquired pneumonia, he has lithium level of 2.16, consistent with lithium toxicity. After initial presentation with agitation patient started to have more confusion, psych reconsulted, MRI cannot be done because of cardiac pacemaker, CT angiography cannot be done because of renal function.  Assessment/Plan:  Acute encephalopathy -Likely multifactorial, suspect lithium toxicity, underlying infections process from UTI and possibly CAP, urinary retention.  -He seemed less agitated once foley was placed  -Continue supportive care, emperic antibiotic therapy, IV fluids. -Lethargic today, did not get any Haldol or Ativan since yesterday. -Psych reconsulted for medication recommendation on discharge and discharge disposition ([facility] versus home).  Acute Kidney Injury, probable CKD stage III -Initial labs showing a Creatinine of 3.12, creatinine today is 2.70. -I reviewed multiple records, patient seems to have chronic kidney disease. -There is scan labs from 04/25/2012 creatinine was 1.29, 09/01/2013 creatinine was 2.2, 02/02/2014 creatinine was 2.73. -Nephrology consulted -U/S showed clinical renal disease, no evidence of obstruction  Urinary Retention -Over 800 mL's of urine drained after the placement of foley.  -Renal ultrasound without hydronephrosis or obstruction.  Possible CAP -Initial CXR showed Right base atelectasis and/or PNA -Was started on emperic AB therapy with Rocephin and Azithromycin -Antibiotics switched to oral  Levaquin.  History of Bipolar Disorder -Presenting with Lithium toxicity -Psychiatry following, recommended Ativan and Haldol as needed.    Code Status: Full Code Family Communication: Spoke with his wife Disposition Plan:    Consultants:  Nephrology   Antibiotics:  Ceftriaxone  Azithromycin   Objective: Filed Vitals:   04/14/14 0538  BP: 126/72  Pulse: 60  Temp: 97.7 F (36.5 C)  Resp: 18    Intake/Output Summary (Last 24 hours) at 04/14/14 1052 Last data filed at 04/14/14 0600  Gross per 24 hour  Intake 1658.33 ml  Output   1400 ml  Net 258.33 ml   Filed Weights   04/09/14 1820  Weight: 68.221 kg (150 lb 6.4 oz)    Exam:   General:  Patient is agitated, thrashing about, significantly improved after insertion of foley  Cardiovascular: Tachycardic, regular rate and rhythm, normal S1S2  Respiratory: Clear to auscultation, normal inspiratory effort  Abdomen: Having mild generalized tenderness to palpation  Musculoskeletal: no edema  Data Reviewed: Basic Metabolic Panel:  Recent Labs Lab 04/10/14 1012 04/11/14 0452 04/12/14 0540 04/13/14 0634 04/14/14 0507  NA 136* 143 145 148* 149*  K 5.0 5.1 4.4 4.7 4.3  CL 105 112 114* 117* 119*  CO2 12* 14* 15* 17* 16*  GLUCOSE 81 72 112* 123* 108*  BUN 31* 34* 30* 28* 31*  CREATININE 2.79* 3.04* 2.79* 2.70* 2.67*  CALCIUM 10.6* 10.1 10.2 10.6* 10.1  PHOS  --   --  2.9 3.9 3.7   Liver Function Tests:  Recent Labs Lab 04/09/14 1204 04/10/14 1012 04/12/14 0540 04/13/14 0634 04/14/14 0507  AST 15 45*  --   --   --   ALT 10 13  --   --   --   ALKPHOS 102 92  --   --   --   BILITOT 0.7 0.7  --   --   --  PROT 8.1 7.2  --   --   --   ALBUMIN 4.9 4.3 3.9 4.0 3.1*   No results found for this basename: LIPASE, AMYLASE,  in the last 168 hours No results found for this basename: AMMONIA,  in the last 168 hours CBC:  Recent Labs Lab 04/09/14 1204 04/10/14 1012 04/11/14 0452  WBC 12.3* 8.0  7.7  NEUTROABS 9.8*  --   --   HGB 11.3* 8.8* 9.2*  HCT 34.0* 27.4* 29.5*  MCV 91.4 94.8 96.7  PLT 203 143* 146*   Cardiac Enzymes: No results found for this basename: CKTOTAL, CKMB, CKMBINDEX, TROPONINI,  in the last 168 hours BNP (last 3 results) No results found for this basename: PROBNP,  in the last 8760 hours CBG:  Recent Labs Lab 04/09/14 1211  GLUCAP 134*    Recent Results (from the past 240 hour(s))  URINE CULTURE     Status: None   Collection Time    04/09/14 12:09 PM      Result Value Ref Range Status   Specimen Description URINE, CLEAN CATCH   Final   Special Requests NONE   Final   Culture  Setup Time     Final   Value: 04/09/2014 19:48     Performed at [place]     Final   Value: NO GROWTH     Performed at [facility]   Culture     Final   Value: NO GROWTH     Performed at [facility]   Report Status 04/10/2014 FINAL   Final  CULTURE, BLOOD (ROUTINE X 2)     Status: None   Collection Time    04/09/14  3:01 PM      Result Value Ref Range Status   Specimen Description BLOOD FOREARM   Final   Special Requests BOTTLES DRAWN AEROBIC AND ANAEROBIC 6ML   Final   Culture  Setup Time     Final   Value: 04/09/2014 19:05     Performed at [facility]   Culture     Final   Value:        BLOOD CULTURE RECEIVED NO GROWTH TO DATE CULTURE WILL BE HELD FOR 5 DAYS BEFORE ISSUING A FINAL NEGATIVE REPORT     Performed at [facility]   Report Status PENDING   Incomplete  CULTURE, BLOOD (ROUTINE X 2)     Status: None   Collection Time    04/09/14  3:01 PM      Result Value Ref Range Status   Specimen Description BLOOD BLOOD LEFT FOREARM   Final   Special Requests     Final   Value: BOTTLES DRAWN AEROBIC AND ANAEROBIC 6ML POST ANTIBIOTICS   Culture  Setup Time     Final   Value: 04/09/2014 19:05     Performed at [facility]   Culture     Final   Value:        BLOOD CULTURE RECEIVED NO GROWTH TO  DATE CULTURE WILL BE HELD FOR 5 DAYS BEFORE ISSUING A FINAL NEGATIVE REPORT     Performed at [facility]   Report Status PENDING   Incomplete     Studies: No results found.  Scheduled Meds: . ALPRAZolam  1 mg Oral TID  . antiseptic oral rinse  15 mL Mouth Rinse q12n4p  . chlorhexidine  15 mL Mouth Rinse BID  . clopidogrel  75  mg Oral q morning - 10a  . darbepoetin (ARANESP) injection - NON-DIALYSIS  100 mcg Subcutaneous Q Sat-1800  . heparin  5,000 Units Subcutaneous 3 times per day  . levofloxacin  500 mg Oral Q48H  . metoprolol succinate  25 mg Oral q morning - 10a  . OLANZapine zydis  5 mg Oral QHS  . pneumococcal 23 valent vaccine  0.5 mL Intramuscular Tomorrow-1000  . polyethylene glycol  17 g Oral Daily  . simvastatin  10 mg Oral QHS  . sodium bicarbonate  1,300 mg Oral BID  . sodium chloride  3 mL Intravenous Q12H  . tamsulosin  0.4 mg Oral q morning - 10a  . traZODone  100 mg Oral QHS   Continuous Infusions: . sodium chloride 100 mL/hr at 04/14/14 0600    Principal Problem:   Bipolar 1 disorder Active Problems:   Inguinal hernia   Cardiac pacemaker in situ   Acute renal failure   Leukocytosis   Pneumonia   Sepsis due to pneumonia   Lithium toxicity    Time spent: 35 min    [place] [facility] Pager [phone]. If 7PM-7AM, please contact night-coverage at [email or web address], password [facility] 04/14/2014, 10:52 AM  LOS: 5 days

## 2014-04-14 NOTE — Progress Notes (Signed)
Patient ID: Jeffrey Herring, male   DOB: Oct 10, 1951, 63 y.o.   MRN: [id number] S:more awake but still nonverbal and not following commands O:BP 126/72  Pulse 60  Temp(Src) 97.7 F (36.5 C) (Axillary)  Resp 18  Ht _0  (1.778 m)  Wt 68.221 kg (150 lb 6.4 oz)  BMI 21.58 kg/m2  SpO2 96%  Intake/Output Summary (Last 24 hours) at 04/14/14 1334 Last data filed at 04/14/14 0600  Gross per 24 hour  Intake 1658.33 ml  Output   1400 ml  Net 258.33 ml   Intake/Output: I/O last 3 completed shifts: In: 1898.3 [P.O.:240; I.V.:1658.3] Out: 2325 [Urine:2325]  Intake/Output this shift:    Weight change:  Gen:WD chronically ill-appearing, lethargic and nonverbal CVS:no rub Resp:scattered rhonchi GYB:WLSLHT Ext:no edema   Recent Labs Lab 04/09/14 1204 04/10/14 1012 04/11/14 0452 04/12/14 0540 04/13/14 0634 04/14/14 0507  NA 135* 136* 143 145 148* 149*  K 4.6 5.0 5.1 4.4 4.7 4.3  CL 104 105 112 114* 117* 119*  CO2 17* 12* 14* 15* 17* 16*  GLUCOSE 125* 81 72 112* 123* 108*  BUN 35* 31* 34* 30* 28* 31*  CREATININE 3.12* 2.79* 3.04* 2.79* 2.70* 2.67*  ALBUMIN 4.9 4.3  --  3.9 4.0 3.1*  CALCIUM 11.1* 10.6* 10.1 10.2 10.6* 10.1  PHOS  --   --   --  2.9 3.9 3.7  AST 15 45*  --   --   --   --   ALT 10 13  --   --   --   --    Liver Function Tests:  Recent Labs Lab 04/09/14 1204 04/10/14 1012 04/12/14 0540 04/13/14 0634 04/14/14 0507  AST 15 45*  --   --   --   ALT 10 13  --   --   --   ALKPHOS 102 92  --   --   --   BILITOT 0.7 0.7  --   --   --   PROT 8.1 7.2  --   --   --   ALBUMIN 4.9 4.3 3.9 4.0 3.1*   No results found for this basename: LIPASE, AMYLASE,  in the last 168 hours No results found for this basename: AMMONIA,  in the last 168 hours CBC:  Recent Labs Lab 04/09/14 1204 04/10/14 1012 04/11/14 0452  WBC 12.3* 8.0 7.7  NEUTROABS 9.8*  --   --   HGB 11.3* 8.8* 9.2*  HCT 34.0* 27.4* 29.5*  MCV 91.4 94.8 96.7  PLT 203 143* 146*   Cardiac Enzymes: No  results found for this basename: CKTOTAL, CKMB, CKMBINDEX, TROPONINI,  in the last 168 hours CBG:  Recent Labs Lab 04/09/14 1211  GLUCAP 134*    Iron Studies:  Recent Labs  04/12/14 0540  IRON 88  TIBC 227  FERRITIN 366*   Studies/Results: No results found. . ALPRAZolam  1 mg Oral TID  . antiseptic oral rinse  15 mL Mouth Rinse q12n4p  . chlorhexidine  15 mL Mouth Rinse BID  . clopidogrel  75 mg Oral q morning - 10a  . darbepoetin (ARANESP) injection - NON-DIALYSIS  100 mcg Subcutaneous Q Sat-1800  . heparin  5,000 Units Subcutaneous 3 times per day  . levofloxacin  500 mg Oral Q48H  . metoprolol succinate  25 mg Oral q morning - 10a  . OLANZapine zydis  5 mg Oral QHS  . pneumococcal 23 valent vaccine  0.5 mL Intramuscular Tomorrow-1000  . polyethylene glycol  17  g Oral Daily  . simvastatin  10 mg Oral QHS  . sodium bicarbonate  1,300 mg Oral BID  . sodium chloride  3 mL Intravenous Q12H  . tamsulosin  0.4 mg Oral q morning - 10a  . traZODone  100 mg Oral QHS    BMET    Component Value Date/Time   NA 149* 04/14/2014 0507   K 4.3 04/14/2014 0507   CL 119* 04/14/2014 0507   CO2 16* 04/14/2014 0507   GLUCOSE 108* 04/14/2014 0507   BUN 31* 04/14/2014 0507   CREATININE 2.67* 04/14/2014 0507   CALCIUM 10.1 04/14/2014 0507   GFRNONAA 24* 04/14/2014 0507   GFRAA 27* 04/14/2014 0507   CBC    Component Value Date/Time   WBC 7.7 04/11/2014 0452   RBC 3.05* 04/11/2014 0452   HGB 9.2* 04/11/2014 0452   HCT 29.5* 04/11/2014 0452   PLT 146* 04/11/2014 0452   MCV 96.7 04/11/2014 0452   MCH 30.2 04/11/2014 0452   MCHC 31.2 04/11/2014 0452   RDW 14.3 04/11/2014 0452   LYMPHSABS 0.9 04/09/2014 1204   MONOABS 1.4* 04/09/2014 1204   EOSABS 0.2 04/09/2014 1204   BASOSABS 0.0 04/09/2014 1204     Assessment/Plan:  1. AKI/CKD high suspicion for lithium toxicity as well as hypercalcemia and BOO. Agree with holding lithium and would not resume. Scr improved. Cont to follow 2. Hypernatremia- not  taking po.  Will change IVF's to sterile water with 39mq of bicarb and follow serum Na levels but may need to have NGT placed for boluses of free water if not taking po. 3. Met acidosis- improved with bicarb but now dropping again.  Will change to IV bicarb as he is not taking po. 4. HTN- stable 5. Psych- off lithium, nonverbal and does not follow commands. recs per Psych 6. ID: CAP- on abx 7. Dispo- pt is not a candidate for outpt dialysis given his condition.  Continue with conservative therapy.  [place] Point[place]

## 2014-04-14 NOTE — Progress Notes (Signed)
Patient's father requested that all four rails be up because he feels that his father is a fall risk. Sitter by bedside and all rails up. Will continue to monitor

## 2014-04-14 NOTE — Consult Note (Signed)
Face to face evaluation and I agree with this note 

## 2014-04-15 ENCOUNTER — Inpatient Hospital Stay (HOSPITAL_COMMUNITY): Payer: Medicare Other

## 2014-04-15 DIAGNOSIS — G9340 Encephalopathy, unspecified: Secondary | ICD-10-CM

## 2014-04-15 LAB — RENAL FUNCTION PANEL
Albumin: 3.7 g/dL (ref 3.5–5.2)
Anion gap: 15 (ref 5–15)
BUN: 28 mg/dL — ABNORMAL HIGH (ref 6–23)
CO2: 21 mEq/L (ref 19–32)
Calcium: 10.2 mg/dL (ref 8.4–10.5)
Chloride: 113 mEq/L — ABNORMAL HIGH (ref 96–112)
Creatinine, Ser: 2.28 mg/dL — ABNORMAL HIGH (ref 0.50–1.35)
GFR calc Af Amer: 33 mL/min — ABNORMAL LOW (ref >90)
GFR calc non Af Amer: 29 mL/min — ABNORMAL LOW (ref >90)
Glucose, Bld: 106 mg/dL — ABNORMAL HIGH (ref 70–99)
Phosphorus: 2.8 mg/dL (ref 2.3–4.6)
Potassium: 4.1 mEq/L (ref 3.7–5.3)
Sodium: 149 mEq/L — ABNORMAL HIGH (ref 137–147)

## 2014-04-15 LAB — CULTURE, BLOOD (ROUTINE X 2)
Culture: NO GROWTH
Culture: NO GROWTH

## 2014-04-15 NOTE — Progress Notes (Signed)
EEG Completed; Results Pending  

## 2014-04-15 NOTE — Consult Note (Addendum)
NEURO HOSPITALIST CONSULT NOTE    Reason for Consult: AMS  HPI:                                                                                                                                          Jeffrey Herring is an 63 y.o. male with history of Bipolar disorder on Lithium, Abdominal pace maker who presented to ED on 04/09/14 for one week of confusion and agitation. At that time patient had a LG temp of 100 and CXR suggestive of R base infiltrate with WBC of over 12K and Cr of 3.12. patient was also found to have urinary retention and foley was placed. Lithium level was obtained and found to be 2.16. Patient was hydrated and lithium was held.  3 days later on 6/28 lithium level was down to 1.22 however creatinine remained elevated at 2.79.  Patient continues to remain confused 6 days after admission and neurology was consulted for further evaluation.    Past Medical History  Diagnosis Date  . Hyperlipemia   . Pacemaker   . Arrhythmia   . Diabetes insipidus   . History of kidney stones   . History of DVT (deep vein thrombosis)   . History of pulmonary embolism     Past Surgical History  Procedure Laterality Date  . Cardiac surgery    . Spine surgery    . Nose surgery    . Pacemaker placement    . Hernia repair Right 02/05/14    Family History  Problem Relation Age of Onset  . Depression Mother      Social History:  reports that he has been smoking Cigars.  He has never used smokeless tobacco. He reports that he does not drink alcohol or use illicit drugs.  Allergies  Allergen Reactions  . Aspirin Hives  . Codeine Nausea And Vomiting  . Demerol [Meperidine] Nausea And Vomiting    MEDICATIONS:                                                                                                                     Prior to Admission:  Prescriptions prior to admission  Medication Sig Dispense Refill  . ALPRAZolam (XANAX) 1 MG tablet Take 1 mg by mouth 3  (three) times daily.      Marland Kitchen  clopidogrel (PLAVIX) 75 MG tablet Take 75 mg by mouth every morning.       Marland Kitchen FLUoxetine (PROZAC) 10 MG capsule Take 10 mg by mouth every morning.      Marland Kitchen lisinopril (PRINIVIL,ZESTRIL) 5 MG tablet Take 5 mg by mouth every morning.       . lithium carbonate 150 MG capsule Take 150 mg by mouth 2 (two) times daily with a meal.      . metoprolol succinate (TOPROL-XL) 25 MG 24 hr tablet Take 25 mg by mouth every morning.       . simvastatin (ZOCOR) 10 MG tablet Take 10 mg by mouth at bedtime.      . tamsulosin (FLOMAX) 0.4 MG CAPS Take 0.4 mg by mouth every morning.       . traZODone (DESYREL) 100 MG tablet Take 100 mg by mouth at bedtime.       Scheduled: . ALPRAZolam  1 mg Oral TID  . antiseptic oral rinse  15 mL Mouth Rinse q12n4p  . chlorhexidine  15 mL Mouth Rinse BID  . clopidogrel  75 mg Oral q morning - 10a  . darbepoetin (ARANESP) injection - NON-DIALYSIS  100 mcg Subcutaneous Q Sat-1800  . heparin  5,000 Units Subcutaneous 3 times per day  . metoprolol succinate  25 mg Oral q morning - 10a  . OLANZapine zydis  5 mg Oral QHS  . pneumococcal 23 valent vaccine  0.5 mL Intramuscular Tomorrow-1000  . polyethylene glycol  17 g Oral Daily  . simvastatin  10 mg Oral QHS  . sodium bicarbonate  1,300 mg Oral BID  . sodium chloride  3 mL Intravenous Q12H  . tamsulosin  0.4 mg Oral q morning - 10a  . traZODone  100 mg Oral QHS     ROS:                                                                                                                                       History obtained from unobtainable from patient due to mental status   Blood pressure 149/87, pulse 68, temperature 98.5 F (36.9 C), temperature source Oral, resp. rate 16, height 5\' 10"  (1.778 m), weight 68.221 kg (150 lb 6.4 oz), SpO2 99.00%.   Neurologic Examination:                                                                                                      Mental Status: Alert,  answers no question appropriately, at  time laughing inappropriately.   Patient does not speak much but when he does it is short one-two word sentences such as "what" or "ok I will".   Able to follow simple commands with both verbal and visual prompting. Easily distracted. Cranial Nerves: II: Discs flat bilaterally; blinks to threat bilaterally, pupils equal, round, reactive to light and accommodation III,IV, VI: ptosis not present, extra-ocular motions intact bilaterally V,VII: smile symmetric, facial light touch sensation normal bilaterally VIII: hearing normal bilaterally IX,X: gag reflex present XI: bilateral shoulder shrug XII: midline tongue extension without atrophy or fasciculations  Motor: Moving all extremities both spontaneously and purposefully with good strength Sensory: withdraws from pain in all extremities. Deep Tendon Reflexes:  Right: Upper Extremity   Left: Upper extremity   biceps (C-5 to C-6) 2/4   biceps (C-5 to C-6) 2/4 tricep (C7) 2/4    triceps (C7) 2/4 Brachioradialis (C6) 2/4  Brachioradialis (C6) 2/4  Lower Extremity Lower Extremity  quadriceps (L-2 to L-4) 2/4   quadriceps (L-2 to L-4) 2/4 Achilles (S1) 2/4   Achilles (S1) 2/4  Plantars: Right: downgoing   Left: downgoing Cerebellar: Finger to nose showed no ataxia.  Could not get patient to take part in heel to shin.  Gait: not tested.  CV: pulses palpable throughout    Lab Results: Basic Metabolic Panel:  Recent Labs Lab 04/10/14 1012 04/11/14 0452 04/12/14 0540 04/13/14 0634 04/14/14 0507  NA 136* 143 145 148* 149*  K 5.0 5.1 4.4 4.7 4.3  CL 105 112 114* 117* 119*  CO2 12* 14* 15* 17* 16*  GLUCOSE 81 72 112* 123* 108*  BUN 31* 34* 30* 28* 31*  CREATININE 2.79* 3.04* 2.79* 2.70* 2.67*  CALCIUM 10.6* 10.1 10.2 10.6* 10.1  PHOS  --   --  2.9 3.9 3.7    Liver Function Tests:  Recent Labs Lab 04/09/14 1204 04/10/14 1012 04/12/14 0540 04/13/14 0634 04/14/14 0507  AST 15 45*  --    --   --   ALT 10 13  --   --   --   ALKPHOS 102 92  --   --   --   BILITOT 0.7 0.7  --   --   --   PROT 8.1 7.2  --   --   --   ALBUMIN 4.9 4.3 3.9 4.0 3.1*   No results found for this basename: LIPASE, AMYLASE,  in the last 168 hours No results found for this basename: AMMONIA,  in the last 168 hours  CBC:  Recent Labs Lab 04/09/14 1204 04/10/14 1012 04/11/14 0452  WBC 12.3* 8.0 7.7  NEUTROABS 9.8*  --   --   HGB 11.3* 8.8* 9.2*  HCT 34.0* 27.4* 29.5*  MCV 91.4 94.8 96.7  PLT 203 143* 146*    Cardiac Enzymes: No results found for this basename: CKTOTAL, CKMB, CKMBINDEX, TROPONINI,  in the last 168 hours  Lipid Panel: No results found for this basename: CHOL, TRIG, HDL, CHOLHDL, VLDL, LDLCALC,  in the last 168 hours  CBG:  Recent Labs Lab 04/09/14 [place] 134*    Microbiology: Results for orders placed during the hospital encounter of 04/09/14  URINE CULTURE     Status: None   Collection Time    04/09/14 12:09 PM      Result Value Ref Range Status   Specimen Description URINE, CLEAN CATCH   Final   Special Requests NONE   Final   Culture  Setup Time  Final   Value: 04/09/2014 19:48     Performed at [facility]     Final   Value: NO GROWTH     Performed at [facility]   Culture     Final   Value: NO GROWTH     Performed at [facility]   Report Status 04/10/2014 FINAL   Final  CULTURE, BLOOD (ROUTINE X 2)     Status: None   Collection Time    04/09/14  3:01 PM      Result Value Ref Range Status   Specimen Description BLOOD FOREARM   Final   Special Requests BOTTLES DRAWN AEROBIC AND ANAEROBIC 6ML   Final   Culture  Setup Time     Final   Value: 04/09/2014 19:05     Performed at [facility]   Culture     Final   Value: NO GROWTH 5 DAYS     Performed at [facility]   Report Status 04/15/2014 FINAL   Final  CULTURE, BLOOD (ROUTINE X 2)     Status: None   Collection Time    04/09/14   3:01 PM      Result Value Ref Range Status   Specimen Description BLOOD BLOOD LEFT FOREARM   Final   Special Requests     Final   Value: BOTTLES DRAWN AEROBIC AND ANAEROBIC 6ML POST ANTIBIOTICS   Culture  Setup Time     Final   Value: 04/09/2014 19:05     Performed at [facility]   Culture     Final   Value: NO GROWTH 5 DAYS     Performed at [facility]   Report Status 04/15/2014 FINAL   Final    Coagulation Studies: No results found for this basename: LABPROT, INR,  in the last 72 hours  Imaging: Ct Head Wo Contrast  04/14/2014   CLINICAL DATA:  Mental status change and confusion  EXAM: CT HEAD WITHOUT CONTRAST  TECHNIQUE: Contiguous axial images were obtained from the base of the skull through the vertex without intravenous contrast.  COMPARISON:  There are no previous studies for comparison.  FINDINGS: The study is limited due to motion artifact. The ventricles are normal in size and position. There is a focus of encephalomalacia in the posterior right parietal lobe. There is no acute intracranial hemorrhage nor objective evidence of an evolving ischemic event. The cerebellum and brainstem are grossly normal.  At bone window settings as best as can be determined there is no acute skull fracture. The observed paranasal sinuses and mastoid air cells are clear.  IMPRESSION: The study is limited due to motion artifact. There is no definite acute intracranial abnormality. Hypodensity in the right posterior parietal lobe is consistent with encephalomalacia.   Electronically Signed   By: David  Martinique   On: 04/14/2014 15:18    Etta Quill PA-C [facility] [phone]  04/15/2014, 4:18 PM  Patient seen and examined.  Clinical course and management discussed.  Necessary edits performed.  I agree with the above.  Assessment and plan of care developed and discussed below.   Assessment/Plan: 63 YO male presenting to hospital with 1 week confusion in setting of  lithium toxicity and decreased renal function. Although patient's level is normal his clearance is clearly impaired which may be affecting his ability to return to baseline further narrowing his therapeutic window.  Patient also has multiple other metabolic issues that may  be affecting his mental status as well.  Lithium continues to be held.    Recommend: 1) Continue supportive care and continued holding of Lithium.  2) Will follow up on EEG 3) Psychiatry should follow for signs of underlying psychiatric disease since patient will be off of Lithium.    Alexis Goodell, MD [facility] [phone]  04/15/2014  7:25 PM

## 2014-04-15 NOTE — Progress Notes (Signed)
Patient ID: Jeffrey Herring, male   DOB: 1950-11-15, 64 y.o.   MRN: [id number] S:"I'm thirsty" O:BP 146/92  Pulse 72  Temp(Src) 97.5 F (36.4 C) (Axillary)  Resp 22  Ht '5\' 10"'  (1.778 m)  Wt 68.221 kg (150 lb 6.4 oz)  BMI 21.58 kg/m2  SpO2 99%  Intake/Output Summary (Last 24 hours) at 04/15/14 0930 Last data filed at 04/15/14 0700  Gross per 24 hour  Intake 3088.33 ml  Output    470 ml  Net 2618.33 ml   Intake/Output: I/O last 3 completed shifts: In: 4288.3 [P.O.:240; I.V.:4048.3] Out: 1870 [Urine:1870]  Intake/Output this shift:    Weight change:  Gen:WD WN WM who is lethargic but arousable, verbal and able to follow simple commands CVS:no rub Resp:occ rhonchi JSH:FWYOVZ Ext:no edema   Recent Labs Lab 04/09/14 1204 04/10/14 1012 04/11/14 0452 04/12/14 0540 04/13/14 0634 04/14/14 0507  NA 135* 136* 143 145 148* 149*  K 4.6 5.0 5.1 4.4 4.7 4.3  CL 104 105 112 114* 117* 119*  CO2 17* 12* 14* 15* 17* 16*  GLUCOSE 125* 81 72 112* 123* 108*  BUN 35* 31* 34* 30* 28* 31*  CREATININE 3.12* 2.79* 3.04* 2.79* 2.70* 2.67*  ALBUMIN 4.9 4.3  --  3.9 4.0 3.1*  CALCIUM 11.1* 10.6* 10.1 10.2 10.6* 10.1  PHOS  --   --   --  2.9 3.9 3.7  AST 15 45*  --   --   --   --   ALT 10 13  --   --   --   --    Liver Function Tests:  Recent Labs Lab 04/09/14 1204 04/10/14 1012 04/12/14 0540 04/13/14 0634 04/14/14 0507  AST 15 45*  --   --   --   ALT 10 13  --   --   --   ALKPHOS 102 92  --   --   --   BILITOT 0.7 0.7  --   --   --   PROT 8.1 7.2  --   --   --   ALBUMIN 4.9 4.3 3.9 4.0 3.1*   No results found for this basename: LIPASE, AMYLASE,  in the last 168 hours No results found for this basename: AMMONIA,  in the last 168 hours CBC:  Recent Labs Lab 04/09/14 1204 04/10/14 1012 04/11/14 0452  WBC 12.3* 8.0 7.7  NEUTROABS 9.8*  --   --   HGB 11.3* 8.8* 9.2*  HCT 34.0* 27.4* 29.5*  MCV 91.4 94.8 96.7  PLT 203 143* 146*   Cardiac Enzymes: No results found for  this basename: CKTOTAL, CKMB, CKMBINDEX, TROPONINI,  in the last 168 hours CBG:  Recent Labs Lab 04/09/14 1211  GLUCAP 134*    Iron Studies: No results found for this basename: IRON, TIBC, TRANSFERRIN, FERRITIN,  in the last 72 hours Studies/Results: Ct Head Wo Contrast  04/14/2014   CLINICAL DATA:  Mental status change and confusion  EXAM: CT HEAD WITHOUT CONTRAST  TECHNIQUE: Contiguous axial images were obtained from the base of the skull through the vertex without intravenous contrast.  COMPARISON:  There are no previous studies for comparison.  FINDINGS: The study is limited due to motion artifact. The ventricles are normal in size and position. There is a focus of encephalomalacia in the posterior right parietal lobe. There is no acute intracranial hemorrhage nor objective evidence of an evolving ischemic event. The cerebellum and brainstem are grossly normal.  At bone window settings as best  as can be determined there is no acute skull fracture. The observed paranasal sinuses and mastoid air cells are clear.  IMPRESSION: The study is limited due to motion artifact. There is no definite acute intracranial abnormality. Hypodensity in the right posterior parietal lobe is consistent with encephalomalacia.   Electronically Signed   By: David  Martinique   On: 04/14/2014 15:18   . ALPRAZolam  1 mg Oral TID  . antiseptic oral rinse  15 mL Mouth Rinse q12n4p  . chlorhexidine  15 mL Mouth Rinse BID  . clopidogrel  75 mg Oral q morning - 10a  . darbepoetin (ARANESP) injection - NON-DIALYSIS  100 mcg Subcutaneous Q Sat-1800  . heparin  5,000 Units Subcutaneous 3 times per day  . levofloxacin  500 mg Oral Q48H  . metoprolol succinate  25 mg Oral q morning - 10a  . OLANZapine zydis  5 mg Oral QHS  . pneumococcal 23 valent vaccine  0.5 mL Intramuscular Tomorrow-1000  . polyethylene glycol  17 g Oral Daily  . simvastatin  10 mg Oral QHS  . sodium bicarbonate  1,300 mg Oral BID  . sodium chloride  3 mL  Intravenous Q12H  . tamsulosin  0.4 mg Oral q morning - 10a  . traZODone  100 mg Oral QHS    BMET    Component Value Date/Time   NA 149* 04/14/2014 0507   K 4.3 04/14/2014 0507   CL 119* 04/14/2014 0507   CO2 16* 04/14/2014 0507   GLUCOSE 108* 04/14/2014 0507   BUN 31* 04/14/2014 0507   CREATININE 2.67* 04/14/2014 0507   CALCIUM 10.1 04/14/2014 0507   GFRNONAA 24* 04/14/2014 0507   GFRAA 27* 04/14/2014 0507   CBC    Component Value Date/Time   WBC 7.7 04/11/2014 0452   RBC 3.05* 04/11/2014 0452   HGB 9.2* 04/11/2014 0452   HCT 29.5* 04/11/2014 0452   PLT 146* 04/11/2014 0452   MCV 96.7 04/11/2014 0452   MCH 30.2 04/11/2014 0452   MCHC 31.2 04/11/2014 0452   RDW 14.3 04/11/2014 0452   LYMPHSABS 0.9 04/09/2014 1204   MONOABS 1.4* 04/09/2014 1204   EOSABS 0.2 04/09/2014 1204   BASOSABS 0.0 04/09/2014 1204     Assessment/Plan:  1. AKI/CKD high suspicion for lithium toxicity as well as hypercalcemia and BOO. Agree with holding lithium and would not resume. Scr improved. Cont to follow 1. Unfortunately labs not drawn this am, will check now but overall appears to be doing better 2. Hypernatremia- not taking po. Will change IVF's to sterile water with 72mq of bicarb and follow serum Na levels but may need to have NGT placed for boluses of free water if not taking po. 1. Thirsty and able to drink some water.  Await serum Na levels and hopefully will be able to decrease IVF's and transition to po 3. Met acidosis- started on hypotonic bicarb yesterday, await labs for today 4. HTN- stable 5. Psych- off lithium, more awake/alert/interactive today and able to commands. recs per Psych 6. ID: CAP- on abx 7. Dispo- pt is not a candidate for outpt dialysis given his condition. Continue with conservative therapy. 8.   Mathius Birkeland A

## 2014-04-15 NOTE — Progress Notes (Signed)
PATIENT DETAILS Name: Jeffrey Herring Age: 63 y.o. Sex: male Date of Birth: 07/13/1951 Admit Date: 04/09/2014 Admitting Physician Donne Hazel, MD IN:[id number], MD  Subjective: Continues to be confused this morning. Follow some commands.  Assessment/Plan: Principal Problem: Acute encephalopathy - Etiology not very evident, current suspicion for metabolic encephalopathy- given lithium toxicity, worsening renal failure, UTI/community acquired pneumonia - CT head on 6/30 negative. Unable to do MRI as the patient has a cardiac pacemaker. - Given persistent altered mental status, and despite improvement in renal function, he can levels, will check an EEG and have consulted neurology as well.  Active Problems: Acute on chronic kidney disease stage III - Multi-factorial etiology for acute renal failure-including eating toxicity, hypercalcemia, and obstructive uropathy. Nephrology consulted, supportive care given, creatinine significantly improved and now close to her usual baseline.  Acute urinary retention - Resolved. Temporarily required placement of Foley catheter. Renal ultrasound did not show any hydronephrosis. - Currently voiding spontaneously, has a condom catheter in place. Continue Flomax  Hypernatremia -? Developing DI secondary to leaking toxicity - Monitor periodically. Cautious to continue with fluids, given hardly any by mouth intake   Metabolic acidosis - Secondary to worsening kidney function on IV fluids with bicarbonate.  Suspected community acquired pneumonia - Remains afebrile, no leukocytosis. Does not have a toxic appearance. - Has finished a course of IV antibiotics including Rocephin/Zithromax, with transition to oral Levaquin on 6/29 for 2 doses- last dose on 7/1.  Long-standing history of bipolar disorder - Presented Lithium toxicity, has been evaluated by psychiatry- he then discontinued permanently-started on Zyprexa  History of chronic  systolic heart failure - Clinically compensated. Continue to monitor carefully while on IV fluids. - ACE inhibitor discontinued given worsening kidney function, continue cautiously with beta blocker blood pressure permitting.  History of complete heart block - Pacemaker in place.  History of venous thromboembolism - Previously was on Coumadin- currently no evidence of any recurrence. On heparin for DVT prophylaxis.  Disposition: Remain inpatient  DVT Prophylaxis: Prophylactic Heparin  Code Status: Full code   Family Communication Spouse over the phone  Procedures:  None  CONSULTS:  nephrology, neurology and psychiatry  Time spent 40 minutes-which includes 50% of the time with face-to-face with patient/ family and coordinating care related to the above assessment and plan.  MEDICATIONS: Scheduled Meds: . ALPRAZolam  1 mg Oral TID  . antiseptic oral rinse  15 mL Mouth Rinse q12n4p  . chlorhexidine  15 mL Mouth Rinse BID  . clopidogrel  75 mg Oral q morning - 10a  . darbepoetin (ARANESP) injection - NON-DIALYSIS  100 mcg Subcutaneous Q Sat-1800  . heparin  5,000 Units Subcutaneous 3 times per day  . metoprolol succinate  25 mg Oral q morning - 10a  . OLANZapine zydis  5 mg Oral QHS  . pneumococcal 23 valent vaccine  0.5 mL Intramuscular Tomorrow-1000  . polyethylene glycol  17 g Oral Daily  . simvastatin  10 mg Oral QHS  . sodium bicarbonate  1,300 mg Oral BID  . sodium chloride  3 mL Intravenous Q12H  . tamsulosin  0.4 mg Oral q morning - 10a  . traZODone  100 mg Oral QHS   Continuous Infusions: .  sodium bicarbonate infusion 1/4 NS 1000 mL 100 mL/hr at 04/15/14 0537   PRN Meds:.acetaminophen, acetaminophen, haloperidol lactate, LORazepam, ondansetron (ZOFRAN) IV, ondansetron  Antibiotics: Anti-infectives   Start     Dose/Rate Route Frequency Ordered Stop  04/13/14 1200  levofloxacin (LEVAQUIN) tablet 500 mg     500 mg Oral Every 48 hours 04/13/14 1143  04/15/14 1200   04/10/14 1400  cefTRIAXone (ROCEPHIN) 1 g in dextrose 5 % 50 mL IVPB  Status:  Discontinued     1 g 100 mL/hr over 30 Minutes Intravenous Every 24 hours 04/09/14 1453 04/13/14 1143   04/10/14 1000  azithromycin (ZITHROMAX) tablet 500 mg  Status:  Discontinued     500 mg Oral Every 24 hours 04/09/14 1453 04/13/14 1143   04/09/14 1500  cefTRIAXone (ROCEPHIN) 1 g in dextrose 5 % 50 mL IVPB  Status:  Discontinued     1 g 100 mL/hr over 30 Minutes Intravenous Every 24 hours 04/09/14 1448 04/09/14 1453   04/09/14 1500  azithromycin (ZITHROMAX) tablet 500 mg  Status:  Discontinued     500 mg Oral Every 24 hours 04/09/14 1448 04/09/14 1453   04/09/14 1330  cefTRIAXone (ROCEPHIN) 1 g in dextrose 5 % 50 mL IVPB     1 g 100 mL/hr over 30 Minutes Intravenous  Once 04/09/14 1316 04/09/14 1451   04/09/14 1330  azithromycin (ZITHROMAX) 500 mg in dextrose 5 % 250 mL IVPB     500 mg 250 mL/hr over 60 Minutes Intravenous  Once 04/09/14 1316 04/09/14 1600       PHYSICAL EXAM: Vital signs in last 24 hours: Filed Vitals:   04/15/14 1008 04/15/14 1011 04/15/14 1018 04/15/14 1105  BP: 85/50 86/55 90/51  144/79  Pulse: 65 64  64  Temp:      TempSrc:      Resp:      Height:      Weight:      SpO2:        Weight change:  Filed Weights   04/09/14 1820  Weight: 68.221 kg (150 lb 6.4 oz)   Body mass index is 21.58 kg/(m^2).   Gen Exam: Awake, follows some commands.Confused Neck: Supple, No JVD.   Chest: B/L Clear.   CVS: S1 S2 Regular, no murmurs.  Abdomen: soft, BS +, non tender, non distended.  Extremities: no edema, lower extremities warm to touch. Neurologic: Non Focal.   Skin: No Rash.   Wounds: N/A.   Intake/Output from previous day:  Intake/Output Summary (Last 24 hours) at 04/15/14 1206 Last data filed at 04/15/14 1054  Gross per 24 hour  Intake 3478.33 ml  Output    470 ml  Net 3008.33 ml     LAB RESULTS: CBC  Recent Labs Lab 04/09/14 1204 04/10/14 1012  04/11/14 0452  WBC 12.3* 8.0 7.7  HGB 11.3* 8.8* 9.2*  HCT 34.0* 27.4* 29.5*  PLT 203 143* 146*  MCV 91.4 94.8 96.7  MCH 30.4 30.4 30.2  MCHC 33.2 32.1 31.2  RDW 13.6 14.0 14.3  LYMPHSABS 0.9  --   --   MONOABS 1.4*  --   --   EOSABS 0.2  --   --   BASOSABS 0.0  --   --     Chemistries   Recent Labs Lab 04/10/14 1012 04/11/14 0452 04/12/14 0540 04/13/14 0634 04/14/14 0507  NA 136* 143 145 148* 149*  K 5.0 5.1 4.4 4.7 4.3  CL 105 112 114* 117* 119*  CO2 12* 14* 15* 17* 16*  GLUCOSE 81 72 112* 123* 108*  BUN 31* 34* 30* 28* 31*  CREATININE 2.79* 3.04* 2.79* 2.70* 2.67*  CALCIUM 10.6* 10.1 10.2 10.6* 10.1    CBG:  Recent Labs Lab  04/09/14 1211  GLUCAP 134*    GFR Estimated Creatinine Clearance: 27 ml/min (by C-G formula based on Cr of 2.67).  Coagulation profile No results found for this basename: INR, PROTIME,  in the last 168 hours  Cardiac Enzymes No results found for this basename: CK, CKMB, TROPONINI, MYOGLOBIN,  in the last 168 hours  No components found with this basename: POCBNP,  No results found for this basename: DDIMER,  in the last 72 hours No results found for this basename: HGBA1C,  in the last 72 hours No results found for this basename: CHOL, HDL, LDLCALC, TRIG, CHOLHDL, LDLDIRECT,  in the last 72 hours No results found for this basename: TSH, T4TOTAL, FREET3, T3FREE, THYROIDAB,  in the last 72 hours No results found for this basename: VITAMINB12, FOLATE, FERRITIN, TIBC, IRON, RETICCTPCT,  in the last 72 hours No results found for this basename: LIPASE, AMYLASE,  in the last 72 hours  Urine Studies No results found for this basename: UACOL, UAPR, USPG, UPH, UTP, UGL, UKET, UBIL, UHGB, UNIT, UROB, ULEU, UEPI, UWBC, URBC, UBAC, CAST, CRYS, UCOM, BILUA,  in the last 72 hours  MICROBIOLOGY: Recent Results (from the past 240 hour(s))  URINE CULTURE     Status: None   Collection Time    04/09/14 12:09 PM      Result Value Ref Range Status    Specimen Description URINE, CLEAN CATCH   Final   Special Requests NONE   Final   Culture  Setup Time     Final   Value: 04/09/2014 19:48     Performed at [facility]     Final   Value: NO GROWTH     Performed at [facility]   Culture     Final   Value: NO GROWTH     Performed at [facility]   Report Status 04/10/2014 FINAL   Final  CULTURE, BLOOD (ROUTINE X 2)     Status: None   Collection Time    04/09/14  3:01 PM      Result Value Ref Range Status   Specimen Description BLOOD FOREARM   Final   Special Requests BOTTLES DRAWN AEROBIC AND ANAEROBIC 6ML   Final   Culture  Setup Time     Final   Value: 04/09/2014 19:05     Performed at [facility]   Culture     Final   Value: NO GROWTH 5 DAYS     Performed at [facility]   Report Status 04/15/2014 FINAL   Final  CULTURE, BLOOD (ROUTINE X 2)     Status: None   Collection Time    04/09/14  3:01 PM      Result Value Ref Range Status   Specimen Description BLOOD BLOOD LEFT FOREARM   Final   Special Requests     Final   Value: BOTTLES DRAWN AEROBIC AND ANAEROBIC 6ML POST ANTIBIOTICS   Culture  Setup Time     Final   Value: 04/09/2014 19:05     Performed at [facility]   Culture     Final   Value: NO GROWTH 5 DAYS     Performed at [facility]   Report Status 04/15/2014 FINAL   Final    RADIOLOGY STUDIES/RESULTS: Dg Chest 2 View  04/09/2014   CLINICAL DATA:  Fever.  EXAM: CHEST  2 VIEW  COMPARISON:  Chest x-ray 01/02/2004.  FINDINGS: Mediastinum and hilar  structures are normal. Stable borderline cardiomegaly. Prior CABG. Pacer leads are noted over the chest. Previously identified pacemaker has been removed. Right base atelectasis and/or infiltrate. No significant pleural effusion. No pneumothorax. No acute bony abnormality.  IMPRESSION: 1. Right base atelectasis and/or infiltrate. Follow-up chest x-ray to demonstrate clearing suggested. 2. Prior CABG.   Borderline cardiomegaly, no CHF.   Electronically Signed   By: Marcello Moores  Register   On: 04/09/2014 12:52   Ct Head Wo Contrast  04/14/2014   CLINICAL DATA:  Mental status change and confusion  EXAM: CT HEAD WITHOUT CONTRAST  TECHNIQUE: Contiguous axial images were obtained from the base of the skull through the vertex without intravenous contrast.  COMPARISON:  There are no previous studies for comparison.  FINDINGS: The study is limited due to motion artifact. The ventricles are normal in size and position. There is a focus of encephalomalacia in the posterior right parietal lobe. There is no acute intracranial hemorrhage nor objective evidence of an evolving ischemic event. The cerebellum and brainstem are grossly normal.  At bone window settings as best as can be determined there is no acute skull fracture. The observed paranasal sinuses and mastoid air cells are clear.  IMPRESSION: The study is limited due to motion artifact. There is no definite acute intracranial abnormality. Hypodensity in the right posterior parietal lobe is consistent with encephalomalacia.   Electronically Signed   By: David  Martinique   On: 04/14/2014 15:18   US Renal  04/11/2014   CLINICAL DATA:  Acute kidney injury and urinary tract infection  EXAM: RENAL/URINARY TRACT ULTRASOUND COMPLETE  COMPARISON:  None.  FINDINGS: Right Kidney:  Length: 10 cm. Echogenic cortex with abnormal cortical medullary differentiation. There is a 9 mm simple appearing cyst in the interpolar region. No hydronephrosis or evidence of solid mass.  Left Kidney:  Length: 10 cm. Echogenic cortex with abnormal cortical medullary differentiation. No hydronephrosis or evidence of mass.  Bladder:  Completely decompressed around a Foley catheter.  IMPRESSION: 1. No hydronephrosis. 2. Chronic medical renal disease.   Electronically Signed   By: Jorje Guild M.D.   On: 04/11/2014 06:17   Dg Chest Port 1 View  04/11/2014   CLINICAL DATA:  Possible pneumonia.  EXAM:  PORTABLE CHEST - 1 VIEW  COMPARISON:  Chest x-ray 04/09/2014.  FINDINGS: Status post median sternotomy. Epicardial wires are seen projecting over the right atrium. Abandoned wires projecting over the upper right hemithorax presumably from prior pacemaker. Lung volumes are low. There are some ill-defined bibasilar opacities favored to reflect some mild subsegmental atelectasis. No consolidative airspace disease. No pleural effusions. No pneumothorax. No evidence of pulmonary edema. Heart size is borderline enlarged. Upper mediastinal contours are unremarkable. Atherosclerosis in the thoracic aorta.  IMPRESSION: 1. Low lung volumes with probable bibasilar subsegmental atelectasis. 2. Atherosclerosis. 3. Postoperative changes and support apparatus, as above.   Electronically Signed   By: Vinnie Langton M.D.   On: 04/11/2014 11:24    Oren Binet, MD  [facility] Pager:[phone]  If 7PM-7AM, please contact night-coverage [email or web address] Password [email or web address] 04/15/2014, 12:06 PM   LOS: 6 days   **Disclaimer: This note may have been dictated with voice recognition software. Similar sounding words can inadvertently be transcribed and this note may contain transcription errors which may not have been corrected upon publication of note.**

## 2014-04-15 NOTE — Consult Note (Signed)
[facility] Consult   Reason for Consult:  Bipolar disorder, lithium toxicity and AMS Referring Physician:  Dr.Chiu  Jeffrey Herring is an 63 y.o. male. Total Time spent with patient: 45 minutes  Assessment: AXIS I:  Bipolar, Depressed AXIS II:  Deferred AXIS III:   Past Medical History  Diagnosis Date  . Hyperlipemia   . Pacemaker   . Arrhythmia   . Diabetes insipidus   . History of kidney stones   . History of DVT (deep vein thrombosis)   . History of pulmonary embolism    AXIS IV:  other psychosocial or environmental problems, problems related to social environment and problems with primary support group AXIS V:  31-40 impairment in reality testing  Plan:  Recommend psychiatric Inpatient admission when medically cleared. Supportive therapy provided about ongoing stressors. Patient benefit from Geriatric psychiatry when medcially stable, Contact psych social service regarding appropriate placement. Continue Zyprexa Zydis 5 mg PO Qhs and may adjust if tolerated May use Haldol and Ativan as needed for agitation and aggressive behaviors Appreciate psychiatric consultation and followup as clinically required  Subjective:   Jeffrey Herring is a 63 y.o. male patient admitted with AMS.  HPI: Patient was seen and chart reviewed. Patient was not able to contribute to the evaluation secondary to being sedated with medication. Patient has renal sonogram planned for today. Staff RN reported patient has been irritable, restless and poorly cooperative.   Interval History: Patient is seen for psychiatric consultation follow up. Patient has little improvement over the past 24 hours. Patient is awake, introduced himself with both first name and last name and than not responded to questions. He is continues to be delirious and unfocused most of the time. He has been undergoing EEG during my visit.  He has making verbal sounds but does not make sense at this time. Patient has  safety sitter at next to his bed.  Will follow up for medical clearance and in patient hospitalization needs.  Medical history: Jeffrey Herring is a 63 y.o. male With a hx of bipolar disorder, DI, hx pacemaker placement who presents to the ED with increased confusion and agitation over the past week prior to admi. In the ED, the patient was found to have a low grade temp of 100F with CXR findings suggestive of R base infiltrate with WBC of over 12K and Cr of 3.12 (previous of over 2). The patient was started on empiric azithromycin and rocephin and the hospitalist service consulted for consideration for admission.   Review of Systems:  Per above, the remainder of the 10pt ros reviewed and are neg   HPI Elements:  Location:  Lithium toxicity. Quality:  Sedation and confusion. Severity:  Acute. Timing:  Unknown causes of lithium toxicity.  Past Psychiatric History: Past Medical History  Diagnosis Date  . Hyperlipemia   . Pacemaker   . Arrhythmia   . Diabetes insipidus   . History of kidney stones   . History of DVT (deep vein thrombosis)   . History of pulmonary embolism     reports that he has been smoking Cigars.  He has never used smokeless tobacco. He reports that he does not drink alcohol or use illicit drugs. Family History  Problem Relation Age of Onset  . Depression Mother      Living Arrangements: Spouse/significant other   Abuse/Neglect [facility]) Physical Abuse: Denies Verbal Abuse: Denies Sexual Abuse: Denies Allergies:   Allergies  Allergen Reactions  . Aspirin Hives  .  Codeine Nausea And Vomiting  . Demerol [Meperidine] Nausea And Vomiting    ACT Assessment Complete:  NO Objective: Blood pressure 144/79, pulse 64, temperature 97.5 F (36.4 C), temperature source Axillary, resp. rate 22, height '5\' 10"'  (1.778 m), weight 68.221 kg (150 lb 6.4 oz), SpO2 99.00%.Body mass index is 21.58 kg/(m^2). Results for orders placed during the hospital encounter of 04/09/14  (from the past 72 hour(s))  RENAL FUNCTION PANEL     Status: Abnormal   Collection Time    04/13/14  6:34 AM      Result Value Ref Range   Sodium 148 (*) 137 - 147 mEq/L   Potassium 4.7  3.7 - 5.3 mEq/L   Chloride 117 (*) 96 - 112 mEq/L   CO2 17 (*) 19 - 32 mEq/L   Glucose, Bld 123 (*) 70 - 99 mg/dL   BUN 28 (*) 6 - 23 mg/dL   Creatinine, Ser 2.70 (*) 0.50 - 1.35 mg/dL   Calcium 10.6 (*) 8.4 - 10.5 mg/dL   Phosphorus 3.9  2.3 - 4.6 mg/dL   Albumin 4.0  3.5 - 5.2 g/dL   GFR calc non Af Amer 23 (*) >90 mL/min   GFR calc Af Amer 27 (*) >90 mL/min   Comment: (NOTE)     The eGFR has been calculated using the CKD EPI equation.     This calculation has not been validated in all clinical situations.     eGFR's persistently <90 mL/min signify possible Chronic Kidney     Disease.  RENAL FUNCTION PANEL     Status: Abnormal   Collection Time    04/14/14  5:07 AM      Result Value Ref Range   Sodium 149 (*) 137 - 147 mEq/L   Potassium 4.3  3.7 - 5.3 mEq/L   Chloride 119 (*) 96 - 112 mEq/L   CO2 16 (*) 19 - 32 mEq/L   Glucose, Bld 108 (*) 70 - 99 mg/dL   BUN 31 (*) 6 - 23 mg/dL   Creatinine, Ser 2.67 (*) 0.50 - 1.35 mg/dL   Calcium 10.1  8.4 - 10.5 mg/dL   Phosphorus 3.7  2.3 - 4.6 mg/dL   Albumin 3.1 (*) 3.5 - 5.2 g/dL   GFR calc non Af Amer 24 (*) >90 mL/min   GFR calc Af Amer 27 (*) >90 mL/min   Comment: (NOTE)     The eGFR has been calculated using the CKD EPI equation.     This calculation has not been validated in all clinical situations.     eGFR's persistently <90 mL/min signify possible Chronic Kidney     Disease.   Labs are reviewed and are pertinent for lithium toxicity and increased bun and cr.  Current Facility-Administered Medications  Medication Dose Route Frequency Provider Last Rate Last Dose  . acetaminophen (TYLENOL) tablet 650 mg  650 mg Oral Q6H PRN Donne Hazel, MD       Or  . acetaminophen (TYLENOL) suppository 650 mg  650 mg Rectal Q6H PRN Donne Hazel,  MD      . ALPRAZolam [name]) tablet 1 mg  1 mg Oral TID Donne Hazel, MD   1 mg at 04/15/14 1027  . antiseptic oral rinse (BIOTENE) solution 15 mL  15 mL Mouth Rinse q12n4p Verlee Monte, MD   15 mL at 04/15/14 1201  . chlorhexidine (PERIDEX) 0.12 % solution 15 mL  15 mL Mouth Rinse BID Verlee Monte, MD   15  mL at 04/15/14 0750  . clopidogrel (PLAVIX) tablet 75 mg  75 mg Oral q morning - 10a Donne Hazel, MD   75 mg at 04/15/14 1026  . darbepoetin (ARANESP) injection 100 mcg  100 mcg Subcutaneous Q Sat-1800 Louis Meckel, MD   100 mcg at 04/11/14 1845  . haloperidol lactate (HALDOL) injection 5 mg  5 mg Intravenous Q6H PRN Donne Hazel, MD   5 mg at 04/12/14 1756  . heparin injection 5,000 Units  5,000 Units Subcutaneous 3 times per day Donne Hazel, MD   5,000 Units at 04/15/14 0530  . LORazepam (ATIVAN) injection 1 mg  1 mg Intravenous Q6H PRN Verlee Monte, MD   1 mg at 04/14/14 1816  . metoprolol succinate (TOPROL-XL) 24 hr tablet 25 mg  25 mg Oral q morning - 10a Donne Hazel, MD   25 mg at 04/14/14 1011  . OLANZapine zydis (ZYPREXA) disintegrating tablet 5 mg  5 mg Oral QHS Durward Parcel, MD   5 mg at 04/14/14 2221  . ondansetron (ZOFRAN) tablet 4 mg  4 mg Oral Q6H PRN Donne Hazel, MD       Or  . ondansetron [facility]) injection 4 mg  4 mg Intravenous Q6H PRN Donne Hazel, MD      . pneumococcal 23 valent vaccine (PNU-IMMUNE) injection 0.5 mL  0.5 mL Intramuscular Tomorrow-1000 Mutaz Elmahi, MD      . polyethylene glycol (MIRALAX / GLYCOLAX) packet 17 g  17 g Oral Daily Verlee Monte, MD   17 g at 04/15/14 1027  . simvastatin (ZOCOR) tablet 10 mg  10 mg Oral QHS Donne Hazel, MD   10 mg at 04/14/14 2220  . sodium bicarbonate tablet 1,300 mg  1,300 mg Oral BID Louis Meckel, MD   1,300 mg at 04/15/14 1034  . sodium chloride 0.225 % with sodium bicarbonate 50 mEq infusion   Intravenous Continuous Donetta Potts, MD 100 mL/hr at 04/15/14 0537    .  sodium chloride 0.9 % injection 3 mL  3 mL Intravenous Q12H Donne Hazel, MD   3 mL at 04/14/14 2221  . tamsulosin (FLOMAX) capsule 0.4 mg  0.4 mg Oral q morning - 10a Donne Hazel, MD   0.4 mg at 04/15/14 1026  . traZODone (DESYREL) tablet 100 mg  100 mg Oral QHS Donne Hazel, MD   100 mg at 04/14/14 2221    Psychiatric Specialty Exam: Physical Exam  ROS  Blood pressure 144/79, pulse 64, temperature 97.5 F (36.4 C), temperature source Axillary, resp. rate 22, height '5\' 10"'  (1.778 m), weight 68.221 kg (150 lb 6.4 oz), SpO2 99.00%.Body mass index is 21.58 kg/(m^2).  General Appearance: Guarded  Eye Contact::  Minimal  Speech:  NA  Volume:  Decreased  Mood:  NA  Affect:  NA  Thought Process:  NA  Orientation:  NA  Thought Content:  NA  Suicidal Thoughts:  No  Homicidal Thoughts:  No  Memory:  NA  Judgement:  NA  Insight:  NA  Psychomotor Activity:  NA  Concentration:  NA  Recall:  NA  Fund of Knowledge:NA  Language: NA  Akathisia:  NA  Handed:  Right  AIMS (if indicated):     Assets:  Financial Resources/Insurance Housing Intimacy Leisure Time Resilience Social Support Talents/Skills  Sleep:      Musculoskeletal: Strength & Muscle Tone: decreased Gait & Station: unable to stand Patient leans: N/A  Treatment  Plan Summary: Daily contact with patient to assess and evaluate symptoms and progress in treatment Medication management  Levern Pitter,JANARDHAHA R. 04/15/2014 1:37 PM

## 2014-04-16 DIAGNOSIS — T5691XA Toxic effect of unspecified metal, accidental (unintentional), initial encounter: Secondary | ICD-10-CM

## 2014-04-16 DIAGNOSIS — T56894A Toxic effect of other metals, undetermined, initial encounter: Secondary | ICD-10-CM

## 2014-04-16 LAB — RENAL FUNCTION PANEL
Albumin: 3.6 g/dL (ref 3.5–5.2)
Anion gap: 15 (ref 5–15)
BUN: 26 mg/dL — ABNORMAL HIGH (ref 6–23)
CO2: 23 mEq/L (ref 19–32)
Calcium: 9.9 mg/dL (ref 8.4–10.5)
Chloride: 112 mEq/L (ref 96–112)
Creatinine, Ser: 2.23 mg/dL — ABNORMAL HIGH (ref 0.50–1.35)
GFR calc Af Amer: 34 mL/min — ABNORMAL LOW (ref >90)
GFR calc non Af Amer: 29 mL/min — ABNORMAL LOW (ref >90)
Glucose, Bld: 97 mg/dL (ref 70–99)
Phosphorus: 2.9 mg/dL (ref 2.3–4.6)
Potassium: 3.9 mEq/L (ref 3.7–5.3)
Sodium: 150 mEq/L — ABNORMAL HIGH (ref 137–147)

## 2014-04-16 LAB — LITHIUM LEVEL: Lithium Lvl: 0.56 mEq/L — ABNORMAL LOW (ref 0.80–1.40)

## 2014-04-16 MED ORDER — OLANZAPINE 5 MG PO TBDP
5.0000 mg | ORAL_TABLET | Freq: Every day | ORAL | Status: DC
  Administered 2014-04-16 – 2014-04-18 (×3): 5 mg via ORAL
  Filled 2014-04-16 (×4): qty 1

## 2014-04-16 MED ORDER — SODIUM CHLORIDE 0.45 % IV SOLN
INTRAVENOUS | Status: DC
  Administered 2014-04-16 – 2014-04-19 (×7): via INTRAVENOUS

## 2014-04-16 NOTE — Procedures (Signed)
ELECTROENCEPHALOGRAM REPORT   Patient: Jeffrey Herring       Room #: [id number] EEG No. ID: [id number] Age: 63 y.o.        Sex: male Referring Physician: Ghimire Report Date:  04/15/2014        Interpreting Physician: Alexis Goodell D  History: Jeffrey Herring is an 63 y.o. male with altered mental status and lithium toxicity  Medications:  Scheduled: . ALPRAZolam  1 mg Oral TID  . antiseptic oral rinse  15 mL Mouth Rinse q12n4p  . chlorhexidine  15 mL Mouth Rinse BID  . clopidogrel  75 mg Oral q morning - 10a  . darbepoetin (ARANESP) injection - NON-DIALYSIS  100 mcg Subcutaneous Q Sat-1800  . heparin  5,000 Units Subcutaneous 3 times per day  . metoprolol succinate  25 mg Oral q morning - 10a  . OLANZapine zydis  5 mg Oral QHS  . pneumococcal 23 valent vaccine  0.5 mL Intramuscular Tomorrow-1000  . polyethylene glycol  17 g Oral Daily  . simvastatin  10 mg Oral QHS  . sodium bicarbonate  1,300 mg Oral BID  . sodium chloride  3 mL Intravenous Q12H  . tamsulosin  0.4 mg Oral q morning - 10a  . traZODone  100 mg Oral QHS    Conditions of Recording:  This is a 16 channel EEG carried out with the patient in the awake, drowsy and asleep states.  Description:  The waking background activity consists of a large amount of artifact.  The patient is uncooperative.  When the background rhythm can be evaluated it is subtotal and consists of a mixture of low voltage, diffusely distributed mixture of delta and theta activity.  There is a marked absence of faster frequencies with only some rare central alpha noted that is poorly sustained.  The patient has continued slowing with drowse but the background activity can be better evaluated.  Again theta and delta rhythms are seen most prominently with delta activity being more dominant.   The patient goes in to a light sleep with symmetrical sleep spindles and irregular slow activity.  No epileptiform activity is noted.   Hyperventilation and intermittent  photic stimulation were not performed.  IMPRESSION: This is an abnormal EEG secondary to general background slowing.  This finding may be seen with a diffuse disturbance that is etiologically nonspecific, but may include a metabolic encephalopathy, among other possibilities.  No epileptiform activity was noted.     Alexis Goodell, MD [facility] [phone] 04/16/2014, 8:16 AM

## 2014-04-16 NOTE — Progress Notes (Signed)
Subjective: Patient remains confused today but improved.    Objective: Current vital signs: BP 124/77  Pulse 60  Temp(Src) 99.1 F (37.3 C) (Oral)  Resp 20  Ht 5\' 10"  (1.778 m)  Wt 68.221 kg (150 lb 6.4 oz)  BMI 21.58 kg/m2  SpO2 97% Vital signs in last 24 hours: Temp:  [98.2 F (36.8 C)-99.1 F (37.3 C)] 99.1 F (37.3 C) (07/02 1351) Pulse Rate:  [60-71] 60 (07/02 1351) Resp:  [16-20] 20 (07/02 1351) BP: (111-157)/(71-83) 124/77 mmHg (07/02 1351) SpO2:  [94 %-97 %] 97 % (07/02 1351)  Intake/Output from previous day: 07/01 0701 - 07/02 0700 In: 1230 [P.O.:1230] Out: 2200 [Urine:2200] Intake/Output this shift: Total I/O In: 540 [P.O.:540] Out: 800 [Urine:800] Nutritional status: Cardiac  Neurologic Exam: Mental Status:  Patient much more verbal today.  Responds in full sentences and often appropriate to questions being asked.  Follows simple commands.   Cranial Nerves:  II: Blinks to threat bilaterally, pupils equal, round, reactive to light and accommodation  III,IV, VI: ptosis not present, extra-ocular motions intact bilaterally  V,VII: smile symmetric, facial light touch sensation normal bilaterally  VIII: hearing normal bilaterally  IX,X: gag reflex present  XI: bilateral shoulder shrug  XII: midline tongue extension without atrophy or fasciculations  Motor:  Moving all extremities both spontaneously and purposefully with good strength  Sensory: withdraws from pain in all extremities.  Deep Tendon Reflexes:  2+ throughout Plantars:  Right: downgoing  Left: downgoing    Lab Results: Basic Metabolic Panel:  Recent Labs Lab 04/12/14 0540 04/13/14 0634 04/14/14 0507 04/15/14 1653 04/16/14 0435  NA 145 148* 149* 149* 150*  K 4.4 4.7 4.3 4.1 3.9  CL 114* 117* 119* 113* 112  CO2 15* 17* 16* 21 23  GLUCOSE 112* 123* 108* 106* 97  BUN 30* 28* 31* 28* 26*  CREATININE 2.79* 2.70* 2.67* 2.28* 2.23*  CALCIUM 10.2 10.6* 10.1 10.2 9.9  PHOS 2.9 3.9 3.7 2.8  2.9    Liver Function Tests:  Recent Labs Lab 04/10/14 1012 04/12/14 0540 04/13/14 0634 04/14/14 0507 04/15/14 1653 04/16/14 0435  AST 45*  --   --   --   --   --   ALT 13  --   --   --   --   --   ALKPHOS 92  --   --   --   --   --   BILITOT 0.7  --   --   --   --   --   PROT 7.2  --   --   --   --   --   ALBUMIN 4.3 3.9 4.0 3.1* 3.7 3.6   No results found for this basename: LIPASE, AMYLASE,  in the last 168 hours No results found for this basename: AMMONIA,  in the last 168 hours  CBC:  Recent Labs Lab 04/10/14 1012 04/11/14 0452  WBC 8.0 7.7  HGB 8.8* 9.2*  HCT 27.4* 29.5*  MCV 94.8 96.7  PLT 143* 146*    Cardiac Enzymes: No results found for this basename: CKTOTAL, CKMB, CKMBINDEX, TROPONINI,  in the last 168 hours  Lipid Panel: No results found for this basename: CHOL, TRIG, HDL, CHOLHDL, VLDL, LDLCALC,  in the last 168 hours  CBG: No results found for this basename: GLUCAP,  in the last 168 hours  Microbiology: Results for orders placed during the hospital encounter of 04/09/14  URINE CULTURE     Status: None   Collection Time  04/09/14 12:09 PM      Result Value Ref Range Status   Specimen Description URINE, CLEAN CATCH   Final   Special Requests NONE   Final   Culture  Setup Time     Final   Value: 04/09/2014 19:48     Performed at [facility]     Final   Value: NO GROWTH     Performed at [facility]   Culture     Final   Value: NO GROWTH     Performed at [facility]   Report Status 04/10/2014 FINAL   Final  CULTURE, BLOOD (ROUTINE X 2)     Status: None   Collection Time    04/09/14  3:01 PM      Result Value Ref Range Status   Specimen Description BLOOD FOREARM   Final   Special Requests BOTTLES DRAWN AEROBIC AND ANAEROBIC 6ML   Final   Culture  Setup Time     Final   Value: 04/09/2014 19:05     Performed at [facility]   Culture     Final   Value: NO GROWTH 5 DAYS     Performed at  [facility]   Report Status 04/15/2014 FINAL   Final  CULTURE, BLOOD (ROUTINE X 2)     Status: None   Collection Time    04/09/14  3:01 PM      Result Value Ref Range Status   Specimen Description BLOOD BLOOD LEFT FOREARM   Final   Special Requests     Final   Value: BOTTLES DRAWN AEROBIC AND ANAEROBIC 6ML POST ANTIBIOTICS   Culture  Setup Time     Final   Value: 04/09/2014 19:05     Performed at [facility]   Culture     Final   Value: NO GROWTH 5 DAYS     Performed at [facility]   Report Status 04/15/2014 FINAL   Final    Coagulation Studies: No results found for this basename: LABPROT, INR,  in the last 72 hours  Imaging: No results found.  Medications:  I have reviewed the patient's current medications. Scheduled: . ALPRAZolam  1 mg Oral TID  . antiseptic oral rinse  15 mL Mouth Rinse q12n4p  . chlorhexidine  15 mL Mouth Rinse BID  . clopidogrel  75 mg Oral q morning - 10a  . darbepoetin (ARANESP) injection - NON-DIALYSIS  100 mcg Subcutaneous Q Sat-1800  . heparin  5,000 Units Subcutaneous 3 times per day  . metoprolol succinate  25 mg Oral q morning - 10a  . OLANZapine zydis  5 mg Oral QHS  . pneumococcal 23 valent vaccine  0.5 mL Intramuscular Tomorrow-1000  . polyethylene glycol  17 g Oral Daily  . simvastatin  10 mg Oral QHS  . sodium bicarbonate  1,300 mg Oral BID  . sodium chloride  3 mL Intravenous Q12H  . tamsulosin  0.4 mg Oral q morning - 10a  . traZODone  100 mg Oral QHS    Assessment/Plan: Patient slowly improving.  Continue to suspect lithium toxicity.    Recommendations: 1.  Will continue to follow with you     LOS: 7 days   Alexis Goodell, MD [facility] [phone] 04/16/2014  4:38 PM

## 2014-04-16 NOTE — Consult Note (Signed)
[facility] Consult   Reason for Consult:  Bipolar disorder, lithium toxicity and AMS Referring Physician:  Dr. Maryfrances Bunnell CHIKE FARRINGTON is an 63 y.o. male. Total Time spent with patient: 45 minutes  Assessment: AXIS I:  Bipolar, Depressed AXIS II:  Deferred AXIS III:   Past Medical History  Diagnosis Date  . Hyperlipemia   . Pacemaker   . Arrhythmia   . Diabetes insipidus   . History of kidney stones   . History of DVT (deep vein thrombosis)   . History of pulmonary embolism    AXIS IV:  other psychosocial or environmental problems, problems related to social environment and problems with primary support group AXIS V:  31-40 impairment in reality testing  Plan:  Recommend psychiatric Inpatient admission when medically cleared. Supportive therapy provided about ongoing stressors. Patient benefit from Geriatric psychiatry when medcially stable, Contact psych social service regarding appropriate placement. Continue Zyprexa Zydis 5 mg PO Qhs and may adjust if tolerated May use Haldol and Ativan as needed for agitation and aggressive behaviors Appreciate psychiatric consultation and followup as clinically required  Subjective:   JAMON HAYHURST is a 63 y.o. male patient admitted with AMS.  HPI: Patient was seen and chart reviewed. Patient was not able to contribute to the evaluation secondary to being sedated with medication. Patient has renal sonogram planned for today. Staff RN reported patient has been irritable, restless and poorly cooperative.   Interval History: Patient is seen for psychiatric consultation follow up. Patient wife is at bed side and reported that he has been thirsty and has increased oral intake and PCP is considering DI secondary to lithium. Patient has some cognitive improvement over the past 24 hours, he is more verbally responds and continue to be confused and frequently does not responded to querries. He can not hold a conversation at this  time but able answer few direct questions and has inappropriate affect and behaviors like showing his private part etc. Patient has safety sitter at next to his bed.  Spoke with Nonnie Done, LCSW, who is searching for geriatric psych bed in local area when he is medically cleared for psychiatric crisis and medication management. Patient grandson was hospitalized in [place] about a week ago due to heart problems and his wife stated she needs a Social worker for herself. Will continue his zyprexa and may consider Lamictal in few days.    Medical history: DEARION HUOT is a 63 y.o. male With a hx of bipolar disorder, DI, hx pacemaker placement who presents to the ED with increased confusion and agitation over the past week prior to admi. In the ED, the patient was found to have a low grade temp of 100F with CXR findings suggestive of R base infiltrate with WBC of over 12K and Cr of 3.12 (previous of over 2). The patient was started on empiric azithromycin and rocephin and the hospitalist service consulted for consideration for admission.   Review of Systems:  Per above, the remainder of the 10pt ros reviewed and are neg   HPI Elements:  Location:  Lithium toxicity. Quality:  Sedation and confusion. Severity:  Acute. Timing:  Unknown causes of lithium toxicity.  Past Psychiatric History: Past Medical History  Diagnosis Date  . Hyperlipemia   . Pacemaker   . Arrhythmia   . Diabetes insipidus   . History of kidney stones   . History of DVT (deep vein thrombosis)   . History of pulmonary embolism  reports that he has been smoking Cigars.  He has never used smokeless tobacco. He reports that he does not drink alcohol or use illicit drugs. Family History  Problem Relation Age of Onset  . Depression Mother      Living Arrangements: Spouse/significant other   Abuse/Neglect [facility]) Physical Abuse: Denies Verbal Abuse: Denies Sexual Abuse: Denies Allergies:   Allergies  Allergen Reactions  .  Aspirin Hives  . Codeine Nausea And Vomiting  . Demerol [Meperidine] Nausea And Vomiting    ACT Assessment Complete:  NO Objective: Blood pressure 124/77, pulse 60, temperature 99.1 F (37.3 C), temperature source Oral, resp. rate 20, height '5\' 10"'  (1.778 m), weight 68.221 kg (150 lb 6.4 oz), SpO2 97.00%.Body mass index is 21.58 kg/(m^2). Results for orders placed during the hospital encounter of 04/09/14 (from the past 72 hour(s))  RENAL FUNCTION PANEL     Status: Abnormal   Collection Time    04/14/14  5:07 AM      Result Value Ref Range   Sodium 149 (*) 137 - 147 mEq/L   Potassium 4.3  3.7 - 5.3 mEq/L   Chloride 119 (*) 96 - 112 mEq/L   CO2 16 (*) 19 - 32 mEq/L   Glucose, Bld 108 (*) 70 - 99 mg/dL   BUN 31 (*) 6 - 23 mg/dL   Creatinine, Ser 2.67 (*) 0.50 - 1.35 mg/dL   Calcium 10.1  8.4 - 10.5 mg/dL   Phosphorus 3.7  2.3 - 4.6 mg/dL   Albumin 3.1 (*) 3.5 - 5.2 g/dL   GFR calc non Af Amer 24 (*) >90 mL/min   GFR calc Af Amer 27 (*) >90 mL/min   Comment: (NOTE)     The eGFR has been calculated using the CKD EPI equation.     This calculation has not been validated in all clinical situations.     eGFR's persistently <90 mL/min signify possible Chronic Kidney     Disease.  RENAL FUNCTION PANEL     Status: Abnormal   Collection Time    04/15/14  4:53 PM      Result Value Ref Range   Sodium 149 (*) 137 - 147 mEq/L   Potassium 4.1  3.7 - 5.3 mEq/L   Chloride 113 (*) 96 - 112 mEq/L   CO2 21  19 - 32 mEq/L   Glucose, Bld 106 (*) 70 - 99 mg/dL   BUN 28 (*) 6 - 23 mg/dL   Creatinine, Ser 2.28 (*) 0.50 - 1.35 mg/dL   Calcium 10.2  8.4 - 10.5 mg/dL   Phosphorus 2.8  2.3 - 4.6 mg/dL   Albumin 3.7  3.5 - 5.2 g/dL   GFR calc non Af Amer 29 (*) >90 mL/min   GFR calc Af Amer 33 (*) >90 mL/min   Comment: (NOTE)     The eGFR has been calculated using the CKD EPI equation.     This calculation has not been validated in all clinical situations.     eGFR's persistently <90 mL/min  signify possible Chronic Kidney     Disease.   Anion gap 15  5 - 15  RENAL FUNCTION PANEL     Status: Abnormal   Collection Time    04/16/14  4:35 AM      Result Value Ref Range   Sodium 150 (*) 137 - 147 mEq/L   Potassium 3.9  3.7 - 5.3 mEq/L   Chloride 112  96 - 112 mEq/L   CO2 23  19 - 32 mEq/L   Glucose, Bld 97  70 - 99 mg/dL   BUN 26 (*) 6 - 23 mg/dL   Creatinine, Ser 2.23 (*) 0.50 - 1.35 mg/dL   Calcium 9.9  8.4 - 10.5 mg/dL   Phosphorus 2.9  2.3 - 4.6 mg/dL   Albumin 3.6  3.5 - 5.2 g/dL   GFR calc non Af Amer 29 (*) >90 mL/min   GFR calc Af Amer 34 (*) >90 mL/min   Comment: (NOTE)     The eGFR has been calculated using the CKD EPI equation.     This calculation has not been validated in all clinical situations.     eGFR's persistently <90 mL/min signify possible Chronic Kidney     Disease.   Anion gap 15  5 - 15  LITHIUM LEVEL     Status: Abnormal   Collection Time    04/16/14  4:35 AM      Result Value Ref Range   Lithium Lvl 0.56 (*) 0.80 - 1.40 mEq/L   Labs are reviewed and are pertinent for lithium toxicity and increased bun and cr.  Current Facility-Administered Medications  Medication Dose Route Frequency Provider Last Rate Last Dose  . 0.45 % sodium chloride infusion   Intravenous Continuous Donetta Potts, MD 125 mL/hr at 04/16/14 1133    . acetaminophen (TYLENOL) tablet 650 mg  650 mg Oral Q6H PRN Donne Hazel, MD       Or  . acetaminophen (TYLENOL) suppository 650 mg  650 mg Rectal Q6H PRN Donne Hazel, MD      . ALPRAZolam [name]) tablet 1 mg  1 mg Oral TID Donne Hazel, MD   1 mg at 04/16/14 1507  . antiseptic oral rinse (BIOTENE) solution 15 mL  15 mL Mouth Rinse q12n4p Verlee Monte, MD   15 mL at 04/16/14 1600  . chlorhexidine (PERIDEX) 0.12 % solution 15 mL  15 mL Mouth Rinse BID Verlee Monte, MD   15 mL at 04/16/14 0837  . clopidogrel (PLAVIX) tablet 75 mg  75 mg Oral q morning - 10a Donne Hazel, MD   75 mg at 04/16/14 1006  .  darbepoetin (ARANESP) injection 100 mcg  100 mcg Subcutaneous Q Sat-1800 Louis Meckel, MD   100 mcg at 04/11/14 1845  . haloperidol lactate (HALDOL) injection 5 mg  5 mg Intravenous Q6H PRN Donne Hazel, MD   5 mg at 04/12/14 1756  . heparin injection 5,000 Units  5,000 Units Subcutaneous 3 times per day Donne Hazel, MD   5,000 Units at 04/16/14 1507  . LORazepam (ATIVAN) injection 1 mg  1 mg Intravenous Q6H PRN Verlee Monte, MD   1 mg at 04/14/14 1816  . metoprolol succinate (TOPROL-XL) 24 hr tablet 25 mg  25 mg Oral q morning - 10a Donne Hazel, MD   25 mg at 04/16/14 1006  . OLANZapine zydis (ZYPREXA) disintegrating tablet 5 mg  5 mg Oral QHS Durward Parcel, MD   5 mg at 04/15/14 2244  . ondansetron (ZOFRAN) tablet 4 mg  4 mg Oral Q6H PRN Donne Hazel, MD       Or  . ondansetron [facility]) injection 4 mg  4 mg Intravenous Q6H PRN Donne Hazel, MD      . pneumococcal 23 valent vaccine (PNU-IMMUNE) injection 0.5 mL  0.5 mL Intramuscular Tomorrow-1000 Verlee Monte, MD      . polyethylene glycol (MIRALAX / GLYCOLAX)  packet 17 g  17 g Oral Daily Verlee Monte, MD   17 g at 04/16/14 1007  . simvastatin (ZOCOR) tablet 10 mg  10 mg Oral QHS Donne Hazel, MD   10 mg at 04/15/14 2244  . sodium bicarbonate tablet 1,300 mg  1,300 mg Oral BID Louis Meckel, MD   1,300 mg at 04/16/14 1006  . sodium chloride 0.9 % injection 3 mL  3 mL Intravenous Q12H Donne Hazel, MD   3 mL at 04/14/14 2221  . tamsulosin (FLOMAX) capsule 0.4 mg  0.4 mg Oral q morning - 10a Donne Hazel, MD   0.4 mg at 04/16/14 1006  . traZODone (DESYREL) tablet 100 mg  100 mg Oral QHS Donne Hazel, MD   100 mg at 04/15/14 2244    Psychiatric Specialty Exam: Physical Exam  ROS  Blood pressure 124/77, pulse 60, temperature 99.1 F (37.3 C), temperature source Oral, resp. rate 20, height '5\' 10"'  (1.778 m), weight 68.221 kg (150 lb 6.4 oz), SpO2 97.00%.Body mass index is 21.58 kg/(m^2).  General  Appearance: Guarded  Eye Contact::  Fair  Speech:  Blocked  Volume:  Normal  Mood:  Anxious and Depressed  Affect:  Non-Congruent and Inappropriate  Thought Process:  Coherent and Goal Directed  Orientation:  NA  Thought Content:  Rumination  Suicidal Thoughts:  No  Homicidal Thoughts:  No  Memory:  Remote;   Fair  Judgement:  Impaired  Insight:  NA and Lacking  Psychomotor Activity:  Restlessness  Concentration:  Poor  Recall:  Poor  Fund of Knowledge:Fair  Language: Fair  Akathisia:  NA  Handed:  Right  AIMS (if indicated):     Assets:  Financial Resources/Insurance Housing Intimacy Leisure Time Resilience Social Support Talents/Skills  Sleep:      Musculoskeletal: Strength & Muscle Tone: decreased Gait & Station: unable to stand Patient leans: N/A  Treatment Plan Summary: Daily contact with patient to assess and evaluate symptoms and progress in treatment Medication management   Jon,JANARDHAHA R. 04/16/2014 3:37 PM

## 2014-04-16 NOTE — Progress Notes (Signed)
PATIENT DETAILS Name: Jeffrey Herring Age: 63 y.o. Sex: male Date of Birth: April 27, 1951 Admit Date: 04/09/2014 Admitting Physician Donne Hazel, MD IN:[id number], MD  Subjective: Continues to be confused this morning. Follow some commands.  Assessment/Plan: Principal Problem: Acute encephalopathy - Etiology not very evident, current suspicion for metabolic encephalopathy- given lithium toxicity, worsening renal failure, UTI/community acquired pneumonia - CT head on 6/30 negative. Unable to do MRI as the patient has a cardiac pacemaker. - Given persistent altered mental status, and despite improvement in renal function, Neurology consulted,EEG negative for epileptiform waves. Continue supportive measures, very slow improvement.  Active Problems: Acute on chronic kidney disease stage III - Multi-factorial etiology for acute renal failure-including eating toxicity, hypercalcemia, and obstructive uropathy. Nephrology consulted, supportive care given, creatinine significantly improved and now close to her usual baseline.  Acute urinary retention - Resolved. Temporarily required placement of Foley catheter. Renal ultrasound did not show any hydronephrosis. - Currently voiding spontaneously, has a condom catheter in place. Continue Flomax  Hypernatremia -? Developing DI secondary to Lithium toxicity - Monitor periodically. Cautious to continue with fluids. Per RN patient now starting to drink, will continue to encourage oral intake  Metabolic acidosis - Secondary to worsening kidney function on IV fluids with bicarbonate.  Suspected community acquired pneumonia - Remains afebrile, no leukocytosis. Does not have a toxic appearance. - Has finished a course of IV antibiotics including Rocephin/Zithromax, with transition to oral Levaquin on 6/29 for 2 doses- last dose on 7/1.  Long-standing history of bipolar disorder - Presented Lithium toxicity, has been evaluated by  psychiatry- he then discontinued permanently-started on Zyprexa  History of chronic systolic heart failure - Clinically compensated. Continue to monitor carefully while on IV fluids. - ACE inhibitor discontinued given worsening kidney function, continue cautiously with beta blocker blood pressure permitting.  History of complete heart block - Pacemaker in place.  History of venous thromboembolism - Previously was on Coumadin- currently no evidence of any recurrence. On heparin for DVT prophylaxis.  Disposition: Remain inpatient  DVT Prophylaxis: Prophylactic Heparin  Code Status: Full code   Family Communication Spouse over the phone  Procedures:  None  CONSULTS:  nephrology, neurology and psychiatry   MEDICATIONS: Scheduled Meds: . ALPRAZolam  1 mg Oral TID  . antiseptic oral rinse  15 mL Mouth Rinse q12n4p  . chlorhexidine  15 mL Mouth Rinse BID  . clopidogrel  75 mg Oral q morning - 10a  . darbepoetin (ARANESP) injection - NON-DIALYSIS  100 mcg Subcutaneous Q Sat-1800  . heparin  5,000 Units Subcutaneous 3 times per day  . metoprolol succinate  25 mg Oral q morning - 10a  . OLANZapine zydis  5 mg Oral QHS  . pneumococcal 23 valent vaccine  0.5 mL Intramuscular Tomorrow-1000  . polyethylene glycol  17 g Oral Daily  . simvastatin  10 mg Oral QHS  . sodium bicarbonate  1,300 mg Oral BID  . sodium chloride  3 mL Intravenous Q12H  . tamsulosin  0.4 mg Oral q morning - 10a  . traZODone  100 mg Oral QHS   Continuous Infusions: .  sodium bicarbonate infusion 1/4 NS 1000 mL 100 mL/hr at 04/16/14 0335   PRN Meds:.acetaminophen, acetaminophen, haloperidol lactate, LORazepam, ondansetron (ZOFRAN) IV, ondansetron  Antibiotics: Anti-infectives   Start     Dose/Rate Route Frequency Ordered Stop   04/13/14 1200  levofloxacin (LEVAQUIN) tablet 500 mg     500 mg Oral Every 48  hours 04/13/14 1143 04/15/14 1200   04/10/14 1400  cefTRIAXone (ROCEPHIN) 1 g in dextrose 5 % 50  mL IVPB  Status:  Discontinued     1 g 100 mL/hr over 30 Minutes Intravenous Every 24 hours 04/09/14 1453 04/13/14 1143   04/10/14 1000  azithromycin (ZITHROMAX) tablet 500 mg  Status:  Discontinued     500 mg Oral Every 24 hours 04/09/14 1453 04/13/14 1143   04/09/14 1500  cefTRIAXone (ROCEPHIN) 1 g in dextrose 5 % 50 mL IVPB  Status:  Discontinued     1 g 100 mL/hr over 30 Minutes Intravenous Every 24 hours 04/09/14 1448 04/09/14 1453   04/09/14 1500  azithromycin (ZITHROMAX) tablet 500 mg  Status:  Discontinued     500 mg Oral Every 24 hours 04/09/14 1448 04/09/14 1453   04/09/14 1330  cefTRIAXone (ROCEPHIN) 1 g in dextrose 5 % 50 mL IVPB     1 g 100 mL/hr over 30 Minutes Intravenous  Once 04/09/14 1316 04/09/14 1451   04/09/14 1330  azithromycin (ZITHROMAX) 500 mg in dextrose 5 % 250 mL IVPB     500 mg 250 mL/hr over 60 Minutes Intravenous  Once 04/09/14 1316 04/09/14 1600       PHYSICAL EXAM: Vital signs in last 24 hours: Filed Vitals:   04/15/14 1105 04/15/14 1423 04/15/14 2100 04/16/14 0525  BP: 144/79 149/87 157/83 155/81  Pulse: 64 68 61 71  Temp:  98.5 F (36.9 C) 98.2 F (36.8 C) 98.3 F (36.8 C)  TempSrc:  Oral Oral Oral  Resp:  16 16 16   Height:      Weight:      SpO2:  99% 94% 97%    Weight change:  Filed Weights   04/09/14 1820  Weight: 68.221 kg (150 lb 6.4 oz)   Body mass index is 21.58 kg/(m^2).   Gen Exam: Awake, follows some commands.Confused Neck: Supple, No JVD.   Chest: B/L Clear.   CVS: S1 S2 Regular, no murmurs.  Abdomen: soft, BS +, non tender, non distended.  Extremities: no edema, lower extremities warm to touch. Neurologic: Non Focal.   Skin: No Rash.   Wounds: N/A.   Intake/Output from previous day:  Intake/Output Summary (Last 24 hours) at 04/16/14 0910 Last data filed at 04/16/14 0526  Gross per 24 hour  Intake   1230 ml  Output   2200 ml  Net   -970 ml     LAB RESULTS: CBC  Recent Labs Lab 04/09/14 1204  04/10/14 1012 04/11/14 0452  WBC 12.3* 8.0 7.7  HGB 11.3* 8.8* 9.2*  HCT 34.0* 27.4* 29.5*  PLT 203 143* 146*  MCV 91.4 94.8 96.7  MCH 30.4 30.4 30.2  MCHC 33.2 32.1 31.2  RDW 13.6 14.0 14.3  LYMPHSABS 0.9  --   --   MONOABS 1.4*  --   --   EOSABS 0.2  --   --   BASOSABS 0.0  --   --     Chemistries   Recent Labs Lab 04/12/14 0540 04/13/14 0634 04/14/14 0507 04/15/14 1653 04/16/14 0435  NA 145 148* 149* 149* 150*  K 4.4 4.7 4.3 4.1 3.9  CL 114* 117* 119* 113* 112  CO2 15* 17* 16* 21 23  GLUCOSE 112* 123* 108* 106* 97  BUN 30* 28* 31* 28* 26*  CREATININE 2.79* 2.70* 2.67* 2.28* 2.23*  CALCIUM 10.2 10.6* 10.1 10.2 9.9    CBG:  Recent Labs Lab 04/09/14 1211  GLUCAP 134*  GFR Estimated Creatinine Clearance: 32.3 ml/min (by C-G formula based on Cr of 2.23).  Coagulation profile No results found for this basename: INR, PROTIME,  in the last 168 hours  Cardiac Enzymes No results found for this basename: CK, CKMB, TROPONINI, MYOGLOBIN,  in the last 168 hours  No components found with this basename: POCBNP,  No results found for this basename: DDIMER,  in the last 72 hours No results found for this basename: HGBA1C,  in the last 72 hours No results found for this basename: CHOL, HDL, LDLCALC, TRIG, CHOLHDL, LDLDIRECT,  in the last 72 hours No results found for this basename: TSH, T4TOTAL, FREET3, T3FREE, THYROIDAB,  in the last 72 hours No results found for this basename: VITAMINB12, FOLATE, FERRITIN, TIBC, IRON, RETICCTPCT,  in the last 72 hours No results found for this basename: LIPASE, AMYLASE,  in the last 72 hours  Urine Studies No results found for this basename: UACOL, UAPR, USPG, UPH, UTP, UGL, UKET, UBIL, UHGB, UNIT, UROB, ULEU, UEPI, UWBC, URBC, UBAC, CAST, CRYS, UCOM, BILUA,  in the last 72 hours  MICROBIOLOGY: Recent Results (from the past 240 hour(s))  URINE CULTURE     Status: None   Collection Time    04/09/14 12:09 PM      Result Value  Ref Range Status   Specimen Description URINE, CLEAN CATCH   Final   Special Requests NONE   Final   Culture  Setup Time     Final   Value: 04/09/2014 19:48     Performed at [facility]     Final   Value: NO GROWTH     Performed at [facility]   Culture     Final   Value: NO GROWTH     Performed at [facility]   Report Status 04/10/2014 FINAL   Final  CULTURE, BLOOD (ROUTINE X 2)     Status: None   Collection Time    04/09/14  3:01 PM      Result Value Ref Range Status   Specimen Description BLOOD FOREARM   Final   Special Requests BOTTLES DRAWN AEROBIC AND ANAEROBIC 6ML   Final   Culture  Setup Time     Final   Value: 04/09/2014 19:05     Performed at [facility]   Culture     Final   Value: NO GROWTH 5 DAYS     Performed at [facility]   Report Status 04/15/2014 FINAL   Final  CULTURE, BLOOD (ROUTINE X 2)     Status: None   Collection Time    04/09/14  3:01 PM      Result Value Ref Range Status   Specimen Description BLOOD BLOOD LEFT FOREARM   Final   Special Requests     Final   Value: BOTTLES DRAWN AEROBIC AND ANAEROBIC 6ML POST ANTIBIOTICS   Culture  Setup Time     Final   Value: 04/09/2014 19:05     Performed at [facility]   Culture     Final   Value: NO GROWTH 5 DAYS     Performed at [facility]   Report Status 04/15/2014 FINAL   Final    RADIOLOGY STUDIES/RESULTS: Dg Chest 2 View  04/09/2014   CLINICAL DATA:  Fever.  EXAM: CHEST  2 VIEW  COMPARISON:  Chest x-ray 01/02/2004.  FINDINGS: Mediastinum and hilar structures are normal. Stable borderline cardiomegaly. Prior CABG.  Pacer leads are noted over the chest. Previously identified pacemaker has been removed. Right base atelectasis and/or infiltrate. No significant pleural effusion. No pneumothorax. No acute bony abnormality.  IMPRESSION: 1. Right base atelectasis and/or infiltrate. Follow-up chest x-ray to demonstrate clearing suggested.  2. Prior CABG.  Borderline cardiomegaly, no CHF.   Electronically Signed   By: Marcello Moores  Register   On: 04/09/2014 12:52   Ct Head Wo Contrast  04/14/2014   CLINICAL DATA:  Mental status change and confusion  EXAM: CT HEAD WITHOUT CONTRAST  TECHNIQUE: Contiguous axial images were obtained from the base of the skull through the vertex without intravenous contrast.  COMPARISON:  There are no previous studies for comparison.  FINDINGS: The study is limited due to motion artifact. The ventricles are normal in size and position. There is a focus of encephalomalacia in the posterior right parietal lobe. There is no acute intracranial hemorrhage nor objective evidence of an evolving ischemic event. The cerebellum and brainstem are grossly normal.  At bone window settings as best as can be determined there is no acute skull fracture. The observed paranasal sinuses and mastoid air cells are clear.  IMPRESSION: The study is limited due to motion artifact. There is no definite acute intracranial abnormality. Hypodensity in the right posterior parietal lobe is consistent with encephalomalacia.   Electronically Signed   By: David  Martinique   On: 04/14/2014 15:18   US Renal  04/11/2014   CLINICAL DATA:  Acute kidney injury and urinary tract infection  EXAM: RENAL/URINARY TRACT ULTRASOUND COMPLETE  COMPARISON:  None.  FINDINGS: Right Kidney:  Length: 10 cm. Echogenic cortex with abnormal cortical medullary differentiation. There is a 9 mm simple appearing cyst in the interpolar region. No hydronephrosis or evidence of solid mass.  Left Kidney:  Length: 10 cm. Echogenic cortex with abnormal cortical medullary differentiation. No hydronephrosis or evidence of mass.  Bladder:  Completely decompressed around a Foley catheter.  IMPRESSION: 1. No hydronephrosis. 2. Chronic medical renal disease.   Electronically Signed   By: Jorje Guild M.D.   On: 04/11/2014 06:17   Dg Chest Port 1 View  04/11/2014   CLINICAL DATA:  Possible  pneumonia.  EXAM: PORTABLE CHEST - 1 VIEW  COMPARISON:  Chest x-ray 04/09/2014.  FINDINGS: Status post median sternotomy. Epicardial wires are seen projecting over the right atrium. Abandoned wires projecting over the upper right hemithorax presumably from prior pacemaker. Lung volumes are low. There are some ill-defined bibasilar opacities favored to reflect some mild subsegmental atelectasis. No consolidative airspace disease. No pleural effusions. No pneumothorax. No evidence of pulmonary edema. Heart size is borderline enlarged. Upper mediastinal contours are unremarkable. Atherosclerosis in the thoracic aorta.  IMPRESSION: 1. Low lung volumes with probable bibasilar subsegmental atelectasis. 2. Atherosclerosis. 3. Postoperative changes and support apparatus, as above.   Electronically Signed   By: Vinnie Langton M.D.   On: 04/11/2014 11:24    Oren Binet, MD  [facility] Pager:[phone]  If 7PM-7AM, please contact night-coverage [email or web address] Password [email or web address] 04/16/2014, 9:10 AM   LOS: 7 days   **Disclaimer: This note may have been dictated with voice recognition software. Similar sounding words can inadvertently be transcribed and this note may contain transcription errors which may not have been corrected upon publication of note.**

## 2014-04-16 NOTE — Progress Notes (Signed)
Patient ID: Jeffrey Herring, male   DOB: 12/16/1950, 63 y.o.   MRN: [id number] S:much more awake/alert this am and able to follow commands and was much more oriented today O:BP 155/81  Pulse 71  Temp(Src) 98.3 F (36.8 C) (Oral)  Resp 16  Ht 5' 10" (1.778 m)  Wt 68.221 kg (150 lb 6.4 oz)  BMI 21.58 kg/m2  SpO2 97%  Intake/Output Summary (Last 24 hours) at 04/16/14 0925 Last data filed at 04/16/14 0526  Gross per 24 hour  Intake   1230 ml  Output   2200 ml  Net   -970 ml   Intake/Output: I/O last 3 completed shifts: In: 2570 [P.O.:1470; I.V.:1100] Out: 2670 [Urine:2670]  Intake/Output this shift:    Weight change:  Gen:WD WN WM in NAD CVS:no rub Resp:cta Abd:benign Ext:no edema   Recent Labs Lab 04/09/14 1204 04/10/14 1012 04/11/14 0452 04/12/14 0540 04/13/14 0634 04/14/14 0507 04/15/14 1653 04/16/14 0435  NA 135* 136* 143 145 148* 149* 149* 150*  K 4.6 5.0 5.1 4.4 4.7 4.3 4.1 3.9  CL 104 105 112 114* 117* 119* 113* 112  CO2 17* 12* 14* 15* 17* 16* 21 23  GLUCOSE 125* 81 72 112* 123* 108* 106* 97  BUN 35* 31* 34* 30* 28* 31* 28* 26*  CREATININE 3.12* 2.79* 3.04* 2.79* 2.70* 2.67* 2.28* 2.23*  ALBUMIN 4.9 4.3  --  3.9 4.0 3.1* 3.7 3.6  CALCIUM 11.1* 10.6* 10.1 10.2 10.6* 10.1 10.2 9.9  PHOS  --   --   --  2.9 3.9 3.7 2.8 2.9  AST 15 45*  --   --   --   --   --   --   ALT 10 13  --   --   --   --   --   --    Liver Function Tests:  Recent Labs Lab 04/09/14 1204 04/10/14 1012  04/14/14 0507 04/15/14 1653 04/16/14 0435  AST 15 45*  --   --   --   --   ALT 10 13  --   --   --   --   ALKPHOS 102 92  --   --   --   --   BILITOT 0.7 0.7  --   --   --   --   PROT 8.1 7.2  --   --   --   --   ALBUMIN 4.9 4.3  < > 3.1* 3.7 3.6  < > = values in this interval not displayed. No results found for this basename: LIPASE, AMYLASE,  in the last 168 hours No results found for this basename: AMMONIA,  in the last 168 hours CBC:  Recent Labs Lab 04/09/14 1204  04/10/14 1012 04/11/14 0452  WBC 12.3* 8.0 7.7  NEUTROABS 9.8*  --   --   HGB 11.3* 8.8* 9.2*  HCT 34.0* 27.4* 29.5*  MCV 91.4 94.8 96.7  PLT 203 143* 146*   Cardiac Enzymes: No results found for this basename: CKTOTAL, CKMB, CKMBINDEX, TROPONINI,  in the last 168 hours CBG:  Recent Labs Lab 04/09/14 1211  GLUCAP 134*    Iron Studies: No results found for this basename: IRON, TIBC, TRANSFERRIN, FERRITIN,  in the last 72 hours Studies/Results: Ct Head Wo Contrast  04/14/2014   CLINICAL DATA:  Mental status change and confusion  EXAM: CT HEAD WITHOUT CONTRAST  TECHNIQUE: Contiguous axial images were obtained from the base of the skull through the vertex without intravenous   contrast.  COMPARISON:  There are no previous studies for comparison.  FINDINGS: The study is limited due to motion artifact. The ventricles are normal in size and position. There is a focus of encephalomalacia in the posterior right parietal lobe. There is no acute intracranial hemorrhage nor objective evidence of an evolving ischemic event. The cerebellum and brainstem are grossly normal.  At bone window settings as best as can be determined there is no acute skull fracture. The observed paranasal sinuses and mastoid air cells are clear.  IMPRESSION: The study is limited due to motion artifact. There is no definite acute intracranial abnormality. Hypodensity in the right posterior parietal lobe is consistent with encephalomalacia.   Electronically Signed   By: David  Jordan   On: 04/14/2014 15:18   . ALPRAZolam  1 mg Oral TID  . antiseptic oral rinse  15 mL Mouth Rinse q12n4p  . chlorhexidine  15 mL Mouth Rinse BID  . clopidogrel  75 mg Oral q morning - 10a  . darbepoetin (ARANESP) injection - NON-DIALYSIS  100 mcg Subcutaneous Q Sat-1800  . heparin  5,000 Units Subcutaneous 3 times per day  . metoprolol succinate  25 mg Oral q morning - 10a  . OLANZapine zydis  5 mg Oral QHS  . pneumococcal 23 valent vaccine  0.5  mL Intramuscular Tomorrow-1000  . polyethylene glycol  17 g Oral Daily  . simvastatin  10 mg Oral QHS  . sodium bicarbonate  1,300 mg Oral BID  . sodium chloride  3 mL Intravenous Q12H  . tamsulosin  0.4 mg Oral q morning - 10a  . traZODone  100 mg Oral QHS    BMET    Component Value Date/Time   NA 150* 04/16/2014 0435   K 3.9 04/16/2014 0435   CL 112 04/16/2014 0435   CO2 23 04/16/2014 0435   GLUCOSE 97 04/16/2014 0435   BUN 26* 04/16/2014 0435   CREATININE 2.23* 04/16/2014 0435   CALCIUM 9.9 04/16/2014 0435   GFRNONAA 29* 04/16/2014 0435   GFRAA 34* 04/16/2014 0435   CBC    Component Value Date/Time   WBC 7.7 04/11/2014 0452   RBC 3.05* 04/11/2014 0452   HGB 9.2* 04/11/2014 0452   HCT 29.5* 04/11/2014 0452   PLT 146* 04/11/2014 0452   MCV 96.7 04/11/2014 0452   MCH 30.2 04/11/2014 0452   MCHC 31.2 04/11/2014 0452   RDW 14.3 04/11/2014 0452   LYMPHSABS 0.9 04/09/2014 1204   MONOABS 1.4* 04/09/2014 1204   EOSABS 0.2 04/09/2014 1204   BASOSABS 0.0 04/09/2014 1204     Assessment/Plan:  1. AKI/CKD high suspicion for lithium toxicity as well as hypercalcemia and BOO. Agree with holding lithium and would not resume. Scr improved. Cont to follow  1. Scr is now at baseline. 2. Nothing further to add 3. F/u with PCP and can be f/u with our office in the future if needed 2. Hypernatremia- not taking po.  1. Thirsty and able to drink some water. Await serum Na levels and hopefully will be able to decrease IVF's and transition to po 2. Free water deficit is 3L and has not able to keep up with po intake.   3. Please change IVF's to hypotonic or place NGT and start free water boluses to correct hypernatremia (1/2 NS at 125ml/hr) 3. Met acidosis- started on hypotonic bicarb yesterday, await labs for today 4. HTN- stable 5. Psych- off lithium, more awake/alert/interactive today and able to commands. recs per Psych 6. ID:   CAP- on abx 7. Dispo- pt is not a candidate for outpt dialysis given his condition.  Continue with conservative therapy. 8. Will sign off for now, please call with questions or concerns  , A 

## 2014-04-17 DIAGNOSIS — E43 Unspecified severe protein-calorie malnutrition: Secondary | ICD-10-CM | POA: Insufficient documentation

## 2014-04-17 LAB — CBC
HCT: 30.7 % — ABNORMAL LOW (ref 39.0–52.0)
Hemoglobin: 9.5 g/dL — ABNORMAL LOW (ref 13.0–17.0)
MCH: 30.6 pg (ref 26.0–34.0)
MCHC: 30.9 g/dL (ref 30.0–36.0)
MCV: 99 fL (ref 78.0–100.0)
Platelets: 132 10*3/uL — ABNORMAL LOW (ref 150–400)
RBC: 3.1 MIL/uL — ABNORMAL LOW (ref 4.22–5.81)
RDW: 15.4 % (ref 11.5–15.5)
WBC: 6.9 10*3/uL (ref 4.0–10.5)

## 2014-04-17 LAB — RENAL FUNCTION PANEL
Albumin: 3.1 g/dL — ABNORMAL LOW (ref 3.5–5.2)
Anion gap: 12 (ref 5–15)
BUN: 20 mg/dL (ref 6–23)
CO2: 23 mEq/L (ref 19–32)
Calcium: 9.3 mg/dL (ref 8.4–10.5)
Chloride: 110 mEq/L (ref 96–112)
Creatinine, Ser: 2.07 mg/dL — ABNORMAL HIGH (ref 0.50–1.35)
GFR calc Af Amer: 37 mL/min — ABNORMAL LOW (ref >90)
GFR calc non Af Amer: 32 mL/min — ABNORMAL LOW (ref >90)
Glucose, Bld: 93 mg/dL (ref 70–99)
Phosphorus: 2.8 mg/dL (ref 2.3–4.6)
Potassium: 4 mEq/L (ref 3.7–5.3)
Sodium: 145 mEq/L (ref 137–147)

## 2014-04-17 MED ORDER — SENNA 8.6 MG PO TABS
2.0000 | ORAL_TABLET | Freq: Every day | ORAL | Status: DC
  Administered 2014-04-17 – 2014-04-22 (×6): 17.2 mg via ORAL
  Filled 2014-04-17 (×7): qty 2

## 2014-04-17 MED ORDER — ENSURE COMPLETE PO LIQD
237.0000 mL | Freq: Two times a day (BID) | ORAL | Status: DC
  Administered 2014-04-17 – 2014-04-23 (×12): 237 mL via ORAL

## 2014-04-17 NOTE — Progress Notes (Signed)
PT Cancellation Note  Patient Details Name: Jeffrey Herring MRN: [id number] DOB: 1951/07/16   Cancelled Treatment:    Reason Eval/Treat Not Completed: Patient's level of consciousness Patient not able to stay aroused to interact/participate in therapy this AM. RN had just given patient scheduled Xanax prior to attempt. Will follow up next available time if appropriate/able.   Candy Sledge A 04/17/2014, 10:50 AM Candy Sledge, PT, DPT [phone]

## 2014-04-17 NOTE — Progress Notes (Signed)
Covering psych CSW unable to awaken pt to assess. CSW also unable to reach pt wife as no voicemail is setup on her voicemail. CSW will attempt to reach pt wife at a later time.  Hunt Oris, MSW, [place]

## 2014-04-17 NOTE — Consult Note (Signed)
Jeffrey Herring   Reason for Herring:  Bipolar disorder, lithium toxicity and AMS Referring Physician:  Dr. Maryfrances Bunnell Jeffrey Herring is an 63 y.o. male. Total Time spent with patient: 45 minutes  Assessment: AXIS I:  Bipolar, Depressed AXIS II:  Deferred AXIS III:   Past Medical History  Diagnosis Date  . Hyperlipemia   . Pacemaker   . Arrhythmia   . Diabetes insipidus   . History of kidney stones   . History of DVT (deep vein thrombosis)   . History of pulmonary embolism    AXIS IV:  other psychosocial or environmental problems, problems related to social environment and problems with primary support group AXIS V:  31-40 impairment in reality testing  Plan:  Psychiatric social service involved finding geriatric psychiatric bed. Ms. Hunt Oris covering for psych social service. Recommend psychiatric Inpatient admission when medically cleared. Supportive therapy provided about ongoing stressors. Patient benefit from Geriatric psychiatry when medcially stable, Contact psych social service regarding appropriate placement. Continue Zyprexa Zydis 5 mg PO Qhs and may adjust if tolerated May use Haldol and Ativan as needed for agitation and aggressive behaviors Appreciate psychiatric consultation and followup as clinically required  Subjective:   Jeffrey Herring is a 63 y.o. male patient admitted with AMS.  HPI: Patient was seen and chart reviewed. Patient was not able to contribute to the evaluation secondary to being sedated with medication. Patient has renal sonogram planned for today. Staff RN reported patient has been irritable, restless and poorly cooperative.   Interval History: Patient appeared sleepy in his bed without distress. Patient has a Air cabin crew in his room who informed that patient has been sleeping more over 1-1/2 hours but unable to wake him up with verbal stimuli. Reportedly patient has slowly improving from the delirium and lithium  toxicity.   Patient has some cognitive improvement over the past 24 hours, he is more verbally responds and continue to be confused and frequently does not responded to querries. He can not hold a conversation at this time but able answer few direct questions and has inappropriate affect and behaviors like showing his private part etc. Patient has safety sitter at next to his bed.  Spoke with Nonnie Done, LCSW, who is searching for geriatric psych bed in local area when he is medically cleared for psychiatric crisis and medication management. Patient grandson was hospitalized in [place] about a week ago due to heart problems and his wife stated she needs a Social worker for herself. Will continue his zyprexa and may consider Lamictal when appropriate..    Medical history: Jeffrey Herring is a 63 y.o. male With a hx of bipolar disorder, DI, hx pacemaker placement who presents to the ED with increased confusion and agitation over the past week prior to admi. In the ED, the patient was found to have a low grade temp of 100F with CXR findings suggestive of R base infiltrate with WBC of over 12K and Cr of 3.12 (previous of over 2). The patient was started on empiric azithromycin and rocephin and the hospitalist service consulted for consideration for admission.   Review of Systems:  Per above, the remainder of the 10pt ros reviewed and are neg   HPI Elements:  Location:  Lithium toxicity. Quality:  Sedation and confusion. Severity:  Acute. Timing:  Unknown causes of lithium toxicity.  Past Psychiatric History: Past Medical History  Diagnosis Date  . Hyperlipemia   . Pacemaker   . Arrhythmia   .  Diabetes insipidus   . History of kidney stones   . History of DVT (deep vein thrombosis)   . History of pulmonary embolism     reports that he has been smoking Cigars.  He has never used smokeless tobacco. He reports that he does not drink alcohol or use illicit drugs. Family History  Problem Relation Age of  Onset  . Depression Mother      Living Arrangements: Spouse/significant other   Abuse/Neglect [facility]) Physical Abuse: Denies Verbal Abuse: Denies Sexual Abuse: Denies Allergies:   Allergies  Allergen Reactions  . Aspirin Hives  . Codeine Nausea And Vomiting  . Demerol [Meperidine] Nausea And Vomiting    ACT Assessment Complete:  NO Objective: Blood pressure 109/67, pulse 64, temperature 97 F (36.1 C), temperature source Oral, resp. rate 20, height '5\' 10"'  (1.778 m), weight 68.221 kg (150 lb 6.4 oz), SpO2 97.00%.Body mass index is 21.58 kg/(m^2). Results for orders placed during the hospital encounter of 04/09/14 (from the past 72 hour(s))  RENAL FUNCTION PANEL     Status: Abnormal   Collection Time    04/15/14  4:53 PM      Result Value Ref Range   Sodium 149 (*) 137 - 147 mEq/L   Potassium 4.1  3.7 - 5.3 mEq/L   Chloride 113 (*) 96 - 112 mEq/L   CO2 21  19 - 32 mEq/L   Glucose, Bld 106 (*) 70 - 99 mg/dL   BUN 28 (*) 6 - 23 mg/dL   Creatinine, Ser 2.28 (*) 0.50 - 1.35 mg/dL   Calcium 10.2  8.4 - 10.5 mg/dL   Phosphorus 2.8  2.3 - 4.6 mg/dL   Albumin 3.7  3.5 - 5.2 g/dL   GFR calc non Af Amer 29 (*) >90 mL/min   GFR calc Af Amer 33 (*) >90 mL/min   Comment: (NOTE)     The eGFR has been calculated using the CKD EPI equation.     This calculation has not been validated in all clinical situations.     eGFR's persistently <90 mL/min signify possible Chronic Kidney     Disease.   Anion gap 15  5 - 15  RENAL FUNCTION PANEL     Status: Abnormal   Collection Time    04/16/14  4:35 AM      Result Value Ref Range   Sodium 150 (*) 137 - 147 mEq/L   Potassium 3.9  3.7 - 5.3 mEq/L   Chloride 112  96 - 112 mEq/L   CO2 23  19 - 32 mEq/L   Glucose, Bld 97  70 - 99 mg/dL   BUN 26 (*) 6 - 23 mg/dL   Creatinine, Ser 2.23 (*) 0.50 - 1.35 mg/dL   Calcium 9.9  8.4 - 10.5 mg/dL   Phosphorus 2.9  2.3 - 4.6 mg/dL   Albumin 3.6  3.5 - 5.2 g/dL   GFR calc non Af Amer 29 (*) >90 mL/min    GFR calc Af Amer 34 (*) >90 mL/min   Comment: (NOTE)     The eGFR has been calculated using the CKD EPI equation.     This calculation has not been validated in all clinical situations.     eGFR's persistently <90 mL/min signify possible Chronic Kidney     Disease.   Anion gap 15  5 - 15  LITHIUM LEVEL     Status: Abnormal   Collection Time    04/16/14  4:35 AM  Result Value Ref Range   Lithium Lvl 0.56 (*) 0.80 - 1.40 mEq/L  RENAL FUNCTION PANEL     Status: Abnormal   Collection Time    04/17/14  4:00 AM      Result Value Ref Range   Sodium 145  137 - 147 mEq/L   Potassium 4.0  3.7 - 5.3 mEq/L   Chloride 110  96 - 112 mEq/L   CO2 23  19 - 32 mEq/L   Glucose, Bld 93  70 - 99 mg/dL   BUN 20  6 - 23 mg/dL   Creatinine, Ser 2.07 (*) 0.50 - 1.35 mg/dL   Calcium 9.3  8.4 - 10.5 mg/dL   Phosphorus 2.8  2.3 - 4.6 mg/dL   Albumin 3.1 (*) 3.5 - 5.2 g/dL   GFR calc non Af Amer 32 (*) >90 mL/min   GFR calc Af Amer 37 (*) >90 mL/min   Comment: (NOTE)     The eGFR has been calculated using the CKD EPI equation.     This calculation has not been validated in all clinical situations.     eGFR's persistently <90 mL/min signify possible Chronic Kidney     Disease.   Anion gap 12  5 - 15  CBC     Status: Abnormal   Collection Time    04/17/14  4:00 AM      Result Value Ref Range   WBC 6.9  4.0 - 10.5 K/uL   RBC 3.10 (*) 4.22 - 5.81 MIL/uL   Hemoglobin 9.5 (*) 13.0 - 17.0 g/dL   HCT 30.7 (*) 39.0 - 52.0 %   MCV 99.0  78.0 - 100.0 fL   MCH 30.6  26.0 - 34.0 pg   MCHC 30.9  30.0 - 36.0 g/dL   RDW 15.4  11.5 - 15.5 %   Platelets 132 (*) 150 - 400 K/uL   Labs are reviewed and are pertinent for lithium toxicity and increased bun and cr.  Current Facility-Administered Medications  Medication Dose Route Frequency Provider Last Rate Last Dose  . 0.45 % sodium chloride infusion   Intravenous Continuous Donetta Potts, MD 125 mL/hr at 04/17/14 [phone]    . acetaminophen (TYLENOL) tablet  650 mg  650 mg Oral Q6H PRN Donne Hazel, MD       Or  . acetaminophen (TYLENOL) suppository 650 mg  650 mg Rectal Q6H PRN Donne Hazel, MD      . ALPRAZolam [name]) tablet 1 mg  1 mg Oral TID Donne Hazel, MD   1 mg at 04/17/14 3335  . clopidogrel (PLAVIX) tablet 75 mg  75 mg Oral q morning - 10a Donne Hazel, MD   75 mg at 04/17/14 4562  . darbepoetin (ARANESP) injection 100 mcg  100 mcg Subcutaneous Q Sat-1800 Louis Meckel, MD   100 mcg at 04/11/14 1845  . feeding supplement (ENSURE COMPLETE) (ENSURE COMPLETE) liquid 237 mL  237 mL Oral BID BM Heather Cornelison Pitts, RD      . haloperidol lactate (HALDOL) injection 5 mg  5 mg Intravenous Q6H PRN Donne Hazel, MD   5 mg at 04/12/14 1756  . heparin injection 5,000 Units  5,000 Units Subcutaneous 3 times per day Donne Hazel, MD   5,000 Units at 04/17/14 0548  . LORazepam (ATIVAN) injection 1 mg  1 mg Intravenous Q6H PRN Verlee Monte, MD   1 mg at 04/14/14 1816  . metoprolol succinate (TOPROL-XL) 24 hr  tablet 25 mg  25 mg Oral q morning - 10a Donne Hazel, MD   25 mg at 04/17/14 6761  . OLANZapine zydis (ZYPREXA) disintegrating tablet 5 mg  5 mg Oral QHS Durward Parcel, MD   5 mg at 04/16/14 1959  . ondansetron (ZOFRAN) tablet 4 mg  4 mg Oral Q6H PRN Donne Hazel, MD       Or  . ondansetron [facility]) injection 4 mg  4 mg Intravenous Q6H PRN Donne Hazel, MD      . pneumococcal 23 valent vaccine (PNU-IMMUNE) injection 0.5 mL  0.5 mL Intramuscular Tomorrow-1000 Mutaz Elmahi, MD      . polyethylene glycol (MIRALAX / GLYCOLAX) packet 17 g  17 g Oral Daily Verlee Monte, MD   17 g at 04/16/14 1007  . simvastatin (ZOCOR) tablet 10 mg  10 mg Oral QHS Donne Hazel, MD   10 mg at 04/15/14 2244  . sodium bicarbonate tablet 1,300 mg  1,300 mg Oral BID Louis Meckel, MD   1,300 mg at 04/17/14 0937  . sodium chloride 0.9 % injection 3 mL  3 mL Intravenous Q12H Donne Hazel, MD   3 mL at 04/14/14 2221  .  tamsulosin (FLOMAX) capsule 0.4 mg  0.4 mg Oral q morning - 10a Donne Hazel, MD   0.4 mg at 04/17/14 9509  . traZODone (DESYREL) tablet 100 mg  100 mg Oral QHS Donne Hazel, MD   100 mg at 04/15/14 2244    Psychiatric Specialty Exam: Physical Exam  ROS  Blood pressure 109/67, pulse 64, temperature 97 F (36.1 C), temperature source Oral, resp. rate 20, height '5\' 10"'  (1.778 m), weight 68.221 kg (150 lb 6.4 oz), SpO2 97.00%.Body mass index is 21.58 kg/(m^2).  General Appearance: Guarded  Eye Contact::  Fair  Speech:  Blocked  Volume:  Normal  Mood:  Anxious and Depressed  Affect:  Non-Congruent and Inappropriate  Thought Process:  Coherent and Goal Directed  Orientation:  NA  Thought Content:  Rumination  Suicidal Thoughts:  No  Homicidal Thoughts:  No  Memory:  Remote;   Fair  Judgement:  Impaired  Insight:  NA and Lacking  Psychomotor Activity:  Restlessness  Concentration:  Poor  Recall:  Poor  Fund of Knowledge:Fair  Language: Fair  Akathisia:  NA  Handed:  Right  AIMS (if indicated):     Assets:  Financial Resources/Insurance Housing Intimacy Leisure Time Resilience Social Support Talents/Skills  Sleep:      Musculoskeletal: Strength & Muscle Tone: decreased Gait & Station: unable to stand Patient leans: N/A  Treatment Plan Summary: Daily contact with patient to assess and evaluate symptoms and progress in treatment Medication management  Shirin Echeverry,JANARDHAHA R. 04/17/2014 12:59 PM

## 2014-04-17 NOTE — Progress Notes (Signed)
Subjective: 63 y.o. male with history of Bipolar disorder on Lithium and abdominal pacemaker who presented to ED on 04/09/14 for one week of confusion and agitation. At that time patient had an LG temp of 100 and CXR suggestive of R base infiltrate with WBC of over 12K and Cr of 3.12. patient was also found to have urinary retention and foley was placed. Lithium level was obtained and found to be 2.16. Patient was hydrated and lithium was held. 3 days later on 6/28 lithium level was down to 1.22 however creatinine remained elevated at 2.79. Patient continues to remain confused 6 days after admission and neurology was consulted for further evaluation.    The patient seems to be improving along with creatinine - 2.07 today. Psych following and psychiatric admission recommended when medically stable. Lithium level yesterday low at 0.56.  He remains pleasantly confused and is without complaints.   Objective: Current vital signs: BP 109/67  Pulse 64  Temp(Src) 97 F (36.1 C) (Oral)  Resp 20  Ht 5\' 10"  (1.778 m)  Wt 150 lb 6.4 oz (68.221 kg)  BMI 21.58 kg/m2  SpO2 97% Vital signs in last 24 hours: Temp:  [97 F (36.1 C)-99.1 F (37.3 C)] 97 F (36.1 C) (07/03 0547) Pulse Rate:  [60-67] 64 (07/03 0547) Resp:  [20] 20 (07/02 2150) BP: (103-124)/(64-77) 109/67 mmHg (07/03 0547) SpO2:  [97 %] 97 % (07/03 0547)  Intake/Output from previous day: 07/02 0701 - 07/03 0700 In: 2590.8 [P.O.:1620; I.V.:970.8] Out: 3250 [Urine:3250] Intake/Output this shift:   Nutritional status: Cardiac  Physical Exam  Neurologic Exam:  MENTAL STATUS: awake, alert, Language fluent. Easily distracted. Follows only some commands. Not oriented to place or date. CRANIAL NERVES: pupils equal and reactive to light, extraocular muscles intact, no nystagmus, facial sensation and strength symmetric, uvula midline, tongue midline. MOTOR: normal bulk and tone, the patient moves all extremities but does not follow  commands for strength testing.  SENSORY: normal and symmetric to light touch  COORDINATION: The patient is unable to follow commands for testing REFLEXES: deep tendon reflexes brisk present and symmetric - no babinski GAIT/STATION: Did not attempt ambulation   Lab Results: Basic Metabolic Panel:  Recent Labs Lab 04/13/14 0634 04/14/14 0507 04/15/14 1653 04/16/14 0435 04/17/14 0400  NA 148* 149* 149* 150* 145  K 4.7 4.3 4.1 3.9 4.0  CL 117* 119* 113* 112 110  CO2 17* 16* 21 23 23   GLUCOSE 123* 108* 106* 97 93  BUN 28* 31* 28* 26* 20  CREATININE 2.70* 2.67* 2.28* 2.23* 2.07*  CALCIUM 10.6* 10.1 10.2 9.9 9.3  PHOS 3.9 3.7 2.8 2.9 2.8    Liver Function Tests:  Recent Labs Lab 04/10/14 1012  04/13/14 0634 04/14/14 0507 04/15/14 1653 04/16/14 0435 04/17/14 0400  AST 45*  --   --   --   --   --   --   ALT 13  --   --   --   --   --   --   ALKPHOS 92  --   --   --   --   --   --   BILITOT 0.7  --   --   --   --   --   --   PROT 7.2  --   --   --   --   --   --   ALBUMIN 4.3  < > 4.0 3.1* 3.7 3.6 3.1*  < > = values in this interval  not displayed. No results found for this basename: LIPASE, AMYLASE,  in the last 168 hours No results found for this basename: AMMONIA,  in the last 168 hours  CBC:  Recent Labs Lab 04/10/14 1012 04/11/14 0452 04/17/14 0400  WBC 8.0 7.7 6.9  HGB 8.8* 9.2* 9.5*  HCT 27.4* 29.5* 30.7*  MCV 94.8 96.7 99.0  PLT 143* 146* 132*    Cardiac Enzymes: No results found for this basename: CKTOTAL, CKMB, CKMBINDEX, TROPONINI,  in the last 168 hours  Lipid Panel: No results found for this basename: CHOL, TRIG, HDL, CHOLHDL, VLDL, LDLCALC,  in the last 168 hours  CBG: No results found for this basename: GLUCAP,  in the last 168 hours  Microbiology: Results for orders placed during the hospital encounter of 04/09/14  URINE CULTURE     Status: None   Collection Time    04/09/14 12:09 PM      Result Value Ref Range Status   Specimen  Description URINE, CLEAN CATCH   Final   Special Requests NONE   Final   Culture  Setup Time     Final   Value: 04/09/2014 19:48     Performed at [facility]     Final   Value: NO GROWTH     Performed at [facility]   Culture     Final   Value: NO GROWTH     Performed at [facility]   Report Status 04/10/2014 FINAL   Final  CULTURE, BLOOD (ROUTINE X 2)     Status: None   Collection Time    04/09/14  3:01 PM      Result Value Ref Range Status   Specimen Description BLOOD FOREARM   Final   Special Requests BOTTLES DRAWN AEROBIC AND ANAEROBIC 6ML   Final   Culture  Setup Time     Final   Value: 04/09/2014 19:05     Performed at [facility]   Culture     Final   Value: NO GROWTH 5 DAYS     Performed at [facility]   Report Status 04/15/2014 FINAL   Final  CULTURE, BLOOD (ROUTINE X 2)     Status: None   Collection Time    04/09/14  3:01 PM      Result Value Ref Range Status   Specimen Description BLOOD BLOOD LEFT FOREARM   Final   Special Requests     Final   Value: BOTTLES DRAWN AEROBIC AND ANAEROBIC 6ML POST ANTIBIOTICS   Culture  Setup Time     Final   Value: 04/09/2014 19:05     Performed at [facility]   Culture     Final   Value: NO GROWTH 5 DAYS     Performed at [facility]   Report Status 04/15/2014 FINAL   Final    Coagulation Studies: No results found for this basename: LABPROT, INR,  in the last 72 hours  Lithium level 04/16/2014 0.56 (Low)  Imaging:  CT head without contrast 04/14/2014 The study is limited due to motion artifact. There is no definite acute intracranial abnormality. Hypodensity in the right posterior  parietal lobe is consistent with encephalomalacia.   Medications:  Scheduled: . ALPRAZolam  1 mg Oral TID  . antiseptic oral rinse  15 mL Mouth Rinse q12n4p  . chlorhexidine  15 mL Mouth Rinse BID  . clopidogrel  75 mg Oral q morning - 10a  .  darbepoetin  (ARANESP) injection - NON-DIALYSIS  100 mcg Subcutaneous Q Sat-1800  . heparin  5,000 Units Subcutaneous 3 times per day  . metoprolol succinate  25 mg Oral q morning - 10a  . OLANZapine zydis  5 mg Oral QHS  . pneumococcal 23 valent vaccine  0.5 mL Intramuscular Tomorrow-1000  . polyethylene glycol  17 g Oral Daily  . simvastatin  10 mg Oral QHS  . sodium bicarbonate  1,300 mg Oral BID  . sodium chloride  3 mL Intravenous Q12H  . tamsulosin  0.4 mg Oral q morning - 10a  . traZODone  100 mg Oral QHS    Mikey Bussing PA-C [facility] Pager [phone] 04/17/2014, 9:03 AM  Patient seen and examined.  Clinical course and management discussed.  Necessary edits performed.  I agree with the above.  Assessment and plan of care developed and discussed below.    Assessment/Plan:  Patient somewhat improved.  No longer seems to require restraints.  Cooperative but not at baseline.  Renal function and lithium level improving.  Psych involved.    Recommendations: 1.  Will continue to follow with you   Alexis Goodell, MD [facility] [phone]  04/17/2014  3:33 PM

## 2014-04-17 NOTE — Progress Notes (Signed)
INITIAL NUTRITION ASSESSMENT  Pt meets criteria for SEVERE MALNUTRITION in the context of acute as evidenced by 6% weight loss in < 1 month and poor intake of </= 50% of his needs for >/= 5 days.  DOCUMENTATION CODES Per approved criteria  -Severe malnutrition in the context of acute illness or injury   INTERVENTION: Ensure Complete po BID, each supplement provides 350 kcal and 13 grams of protein  NUTRITION DIAGNOSIS: Inadequate oral intake related to altered mental status as evidenced by Meal Completion: <50%.   Goal: Pt to meet >/= 90% of their estimated nutrition needs   Monitor:  PO intake, supplement acceptance, weight trend  Reason for Assessment: Pt identified as at nutrition risk on the Malnutrition Screen Tool  63 y.o. male  Admitting Dx: Bipolar 1 disorder  ASSESSMENT: HD # 8. Pt with acute encephalopathy, suspected metabolic. Pt has had persistent altered mental status and poor intake this admission. Pt did eat better this am.  Sitter at bedside. Pt kept falling asleep as I was talking to him.   Nutrition Focused Physical Exam:  Subcutaneous Fat:  Orbital Region: WNL Upper Arm Region: mild depletion Thoracic and Lumbar Region: WNL  Muscle:  Temple Region: mild depletion  Clavicle Bone Region: WNL Clavicle and Acromion Bone Region: mild depletion  Scapular Bone Region: WNL Dorsal Hand: mild depletion  Patellar Region: mild depletion  Anterior Thigh Region: mild depeltion Posterior Calf Region: mild depletion  Edema: not present   Height: Ht Readings from Last 1 Encounters:  04/09/14 5\' 10"  (1.778 m)    Weight: Wt Readings from Last 1 Encounters:  04/09/14 150 lb 6.4 oz (68.221 kg)    Ideal Body Weight: 75.4 kg  % Ideal Body Weight: 90%  Wt Readings from Last 10 Encounters:  04/09/14 150 lb 6.4 oz (68.221 kg)  03/23/14 161 lb (73.029 kg)  02/19/14 161 lb (73.029 kg)  02/18/14 162 lb (73.483 kg)  01/05/14 177 lb (80.287 kg)  08/01/13 175  lb (79.379 kg)  07/11/13 171 lb (77.565 kg)  04/10/13 174 lb (78.926 kg)  02/06/13 176 lb (79.833 kg)  01/01/13 175 lb 6.4 oz (79.561 kg)    Usual Body Weight: 160 lb   % Usual Body Weight: 94%  BMI:  Body mass index is 21.58 kg/(m^2).  Estimated Nutritional Needs: Kcal: 1800-2000 Protein: 80-90 grams Fluid: >1.8L/day  Skin: intact  Diet Order: Cardiac Meal Completion: <50%  EDUCATION NEEDS: -No education needs identified at this time   Intake/Output Summary (Last 24 hours) at 04/17/14 1114 Last data filed at 04/17/14 1000  Gross per 24 hour  Intake 3550.83 ml  Output   4150 ml  Net -599.17 ml    Last BM: none documented   Labs:   Recent Labs Lab 04/15/14 1653 04/16/14 0435 04/17/14 0400  NA 149* 150* 145  K 4.1 3.9 4.0  CL 113* 112 110  CO2 21 23 23   BUN 28* 26* 20  CREATININE 2.28* 2.23* 2.07*  CALCIUM 10.2 9.9 9.3  PHOS 2.8 2.9 2.8  GLUCOSE 106* 97 93    CBG (last 3)  No results found for this basename: GLUCAP,  in the last 72 hours  Scheduled Meds: . ALPRAZolam  1 mg Oral TID  . clopidogrel  75 mg Oral q morning - 10a  . darbepoetin (ARANESP) injection - NON-DIALYSIS  100 mcg Subcutaneous Q Sat-1800  . heparin  5,000 Units Subcutaneous 3 times per day  . metoprolol succinate  25 mg Oral q  morning - 10a  . OLANZapine zydis  5 mg Oral QHS  . pneumococcal 23 valent vaccine  0.5 mL Intramuscular Tomorrow-1000  . polyethylene glycol  17 g Oral Daily  . simvastatin  10 mg Oral QHS  . sodium bicarbonate  1,300 mg Oral BID  . sodium chloride  3 mL Intravenous Q12H  . tamsulosin  0.4 mg Oral q morning - 10a  . traZODone  100 mg Oral QHS    Continuous Infusions: . sodium chloride 125 mL/hr at 04/17/14 [id number]    Past Medical History  Diagnosis Date  . Hyperlipemia   . Pacemaker   . Arrhythmia   . Diabetes insipidus   . History of kidney stones   . History of DVT (deep vein thrombosis)   . History of pulmonary embolism     Past Surgical  History  Procedure Laterality Date  . Cardiac surgery    . Spine surgery    . Nose surgery    . Pacemaker placement    . Hernia repair Right 02/05/14    [place], [place], [place] Pager [phone] After Hours Pager

## 2014-04-17 NOTE — Clinical Social Work Placement (Signed)
Clinical Social Work Department CLINICAL SOCIAL WORK PLACEMENT NOTE 04/17/2014  Patient:  Jeffrey Herring, Jeffrey Herring  Account Number:  [id number] Admit date:  04/09/2014  Clinical Social Worker:  Lovey Newcomer  Date/time:  04/17/2014 11:13 AM  Clinical Social Work is seeking post-discharge placement for this patient at the following level of care:   SKILLED NURSING   (*CSW will update this form in Epic as items are completed)   04/17/2014  Patient/family provided with [facility] Department of Clinical Social Work's list of facilities offering this level of care within the geographic area requested by the patient (or if unable, by the patient's family).  04/17/2014  Patient/family informed of their freedom to choose among providers that offer the needed level of care, that participate in Medicare, Medicaid or managed care program needed by the patient, have an available bed and are willing to accept the patient.  04/17/2014  Patient/family informed of [facility]' ownership interest in [facility], as well as of the fact that they are under no obligation to receive care at this facility.  PASARR submitted to EDS on 04/16/2014 PASARR number received on 04/16/2014  FL2 transmitted to all facilities in geographic area requested by pt/family on  04/17/2014 FL2 transmitted to all facilities within larger geographic area on   Patient informed that his/her managed care company has contracts with or will negotiate with  certain facilities, including the following:     Patient/family informed of bed offers received:   Patient chooses bed at  Physician recommends and patient chooses bed at    Patient to be transferred to  on   Patient to be transferred to facility by  Patient and family notified of transfer on  Name of family member notified:    The following physician request were entered in Epic:   Additional Comments:    Liz Beach MSW, [place], [place],  [id number]

## 2014-04-17 NOTE — Clinical Social Work Psychosocial (Signed)
Clinical Social Work Department BRIEF PSYCHOSOCIAL ASSESSMENT 04/17/2014  Patient:  Jeffrey Herring, Jeffrey Herring     Account Number:  [id number]     Admit date:  04/09/2014  Clinical Social Worker:  Lovey Newcomer  Date/Time:  04/17/2014 11:04 AM  Referred by:  Physician  Date Referred:  04/17/2014 Referred for  SNF Placement   Other Referral:   Interview type:  Family Other interview type:   Patient unable to contribute, patient's wife Jeffrey Herring interviewed by phone as she is currently at [facility] visiting another family member.    PSYCHOSOCIAL DATA Living Status:  WIFE Admitted from facility:   Level of care:   Primary support name:  Jeffrey Herring Primary support relationship to patient:  SPOUSE Degree of support available:   Support is strong    CURRENT CONCERNS Current Concerns  Post-Acute Placement   Other Concerns:    SOCIAL WORK ASSESSMENT / PLAN CSW spoke with patient's wife over phone to complete assessment. Patient has a long history of Bipolar Disorder and presents with lithium toxicity. CSW explained that Psychiatry has recommended Geripsych for patient but that patient may be cleared by psychiatry if he improves enough. CSW explained that if patient is cleared by psychiatry, patient may need to go to a rehab facility before returning home at discharge. CSW explained that PT will work with patient and determine if this is something that patient may need. Patient's wife does state that she is able to provide intermittent supervision to patient but patient would be at home alone at times. Patient's wife is agreeable to a SNF backup plan and would prefer [facility]. CSW explained SNF search/placement process to wife and answered questions.   Assessment/plan status:  Psychosocial Support/Ongoing Assessment of Needs Other assessment/ plan:   Complete Fl2, Fax, PASRR   Information/referral to community resources:   CSW contact information and SNF list given.     PATIENT'S/FAMILY'S RESPONSE TO PLAN OF CARE: Patient's wife is agreeable to a SNF backup plan if this is necessary. Wife was engaged in assessment. CSW will follow up with bed offers.       Liz Beach MSW, [place], [place], [id number]

## 2014-04-17 NOTE — Progress Notes (Signed)
PATIENT DETAILS Name: Jeffrey Herring Age: 63 y.o. Sex: male Date of Birth: [birth year] Admit Date: 04/09/2014 Admitting Physician Donne Hazel, MD [id number], MD  Subjective: Improving, more alert and less confused. Now finally eating, per RN drinking a lot of water  Assessment/Plan: Principal Problem: Acute encephalopathy - Etiology not very evident, current suspicion for metabolic encephalopathy- given lithium toxicity, worsening renal failure, UTI/community acquired pneumonia. Per Neurology mental status expected to slowly improve - CT head on 6/30 negative. Unable to do MRI as the patient has a cardiac pacemaker. - Given persistent altered mental status, and despite improvement in renal function, Neurology consulted,EEG negative for epileptiform waves. Continue supportive measures,  slow improvement continues  Active Problems: Acute on chronic kidney disease stage III - Multi-factorial etiology for acute renal failure-including eating toxicity, hypercalcemia, and obstructive uropathy. Nephrology consulted, supportive care given, creatinine significantly improved and now close to her usual baseline.  Acute urinary retention - Resolved. Temporarily required placement of Foley catheter. Renal ultrasound did not show any hydronephrosis. - Currently voiding spontaneously, has a condom catheter in place. Continue Flomax  Hypernatremia -? Developing DI secondary to Lithium toxicity - Monitor periodically. Cautious to continue with fluids. Per RN patient now starting to drink, will continue to encourage oral intake  Metabolic acidosis - Secondary to worsening kidney function on IV fluids with bicarbonate.  Suspected community acquired pneumonia - Remains afebrile, no leukocytosis. Does not have a toxic appearance. - Has finished a course of IV antibiotics including Rocephin/Zithromax, with transition to oral Levaquin on 6/29 for 2 doses- last dose on  7/1.  Long-standing history of bipolar disorder - Presented Lithium toxicity, has been evaluated by psychiatry- he then discontinued permanently-started on Zyprexa  History of chronic systolic heart failure - Clinically compensated. Continue to monitor carefully while on IV fluids. - ACE inhibitor discontinued given worsening kidney function, continue cautiously with beta blocker blood pressure permitting.  History of complete heart block - Pacemaker in place.  History of venous thromboembolism - Previously was on Coumadin- currently no evidence of any recurrence. On heparin for DVT prophylaxis.  Disposition: Remain inpatient  DVT Prophylaxis: Prophylactic Heparin  Code Status: Full code   Family Communication None at bedside  Procedures:  None  CONSULTS:  nephrology, neurology and psychiatry   MEDICATIONS: Scheduled Meds: . ALPRAZolam  1 mg Oral TID  . antiseptic oral rinse  15 mL Mouth Rinse q12n4p  . chlorhexidine  15 mL Mouth Rinse BID  . clopidogrel  75 mg Oral q morning - 10a  . darbepoetin (ARANESP) injection - NON-DIALYSIS  100 mcg Subcutaneous Q Sat-1800  . heparin  5,000 Units Subcutaneous 3 times per day  . metoprolol succinate  25 mg Oral q morning - 10a  . OLANZapine zydis  5 mg Oral QHS  . pneumococcal 23 valent vaccine  0.5 mL Intramuscular Tomorrow-1000  . polyethylene glycol  17 g Oral Daily  . simvastatin  10 mg Oral QHS  . sodium bicarbonate  1,300 mg Oral BID  . sodium chloride  3 mL Intravenous Q12H  . tamsulosin  0.4 mg Oral q morning - 10a  . traZODone  100 mg Oral QHS   Continuous Infusions: . sodium chloride 125 mL/hr at 04/17/14 0332   PRN Meds:.acetaminophen, acetaminophen, haloperidol lactate, LORazepam, ondansetron (ZOFRAN) IV, ondansetron  Antibiotics: Anti-infectives   Start     Dose/Rate Route Frequency Ordered Stop   04/13/14 1200  levofloxacin (LEVAQUIN) tablet 500 mg  500 mg Oral Every 48 hours 04/13/14 1143 04/15/14  1200   04/10/14 1400  cefTRIAXone (ROCEPHIN) 1 g in dextrose 5 % 50 mL IVPB  Status:  Discontinued     1 g 100 mL/hr over 30 Minutes Intravenous Every 24 hours 04/09/14 1453 04/13/14 1143   04/10/14 1000  azithromycin (ZITHROMAX) tablet 500 mg  Status:  Discontinued     500 mg Oral Every 24 hours 04/09/14 1453 04/13/14 1143   04/09/14 1500  cefTRIAXone (ROCEPHIN) 1 g in dextrose 5 % 50 mL IVPB  Status:  Discontinued     1 g 100 mL/hr over 30 Minutes Intravenous Every 24 hours 04/09/14 1448 04/09/14 1453   04/09/14 1500  azithromycin (ZITHROMAX) tablet 500 mg  Status:  Discontinued     500 mg Oral Every 24 hours 04/09/14 1448 04/09/14 1453   04/09/14 1330  cefTRIAXone (ROCEPHIN) 1 g in dextrose 5 % 50 mL IVPB     1 g 100 mL/hr over 30 Minutes Intravenous  Once 04/09/14 1316 04/09/14 1451   04/09/14 1330  azithromycin (ZITHROMAX) 500 mg in dextrose 5 % 250 mL IVPB     500 mg 250 mL/hr over 60 Minutes Intravenous  Once 04/09/14 1316 04/09/14 1600       PHYSICAL EXAM: Vital signs in last 24 hours: Filed Vitals:   04/16/14 1006 04/16/14 1351 04/16/14 2150 04/17/14 0547  BP: 111/71 124/77 103/64 109/67  Pulse: 67 60 60 64  Temp:  99.1 F (37.3 C) 98.1 F (36.7 C) 97 F (36.1 C)  TempSrc:  Oral Oral Oral  Resp:  20 20   Height:      Weight:      SpO2:  97% 97% 97%    Weight change:  Filed Weights   04/09/14 1820  Weight: 68.221 kg (150 lb 6.4 oz)   Body mass index is 21.58 kg/(m^2).   Gen Exam: Awake, and much more alert. Answers a lot more questions appropriately today Neck: Supple, No JVD.   Chest: B/L Clear.   CVS: S1 S2 Regular, no murmurs.  Abdomen: soft, BS +, non tender, non distended.  Extremities: no edema, lower extremities warm to touch. Neurologic: Non Focal.   Skin: No Rash.   Wounds: N/A.   Intake/Output from previous day:  Intake/Output Summary (Last 24 hours) at 04/17/14 0940 Last data filed at 04/17/14 [id number]  Gross per 24 hour  Intake 2590.83 ml   Output   4150 ml  Net -1559.17 ml     LAB RESULTS: CBC  Recent Labs Lab 04/10/14 1012 04/11/14 0452 04/17/14 0400  WBC 8.0 7.7 6.9  HGB 8.8* 9.2* 9.5*  HCT 27.4* 29.5* 30.7*  PLT 143* 146* 132*  MCV 94.8 96.7 99.0  MCH 30.4 30.2 30.6  MCHC 32.1 31.2 30.9  RDW 14.0 14.3 15.4    Chemistries   Recent Labs Lab 04/13/14 0634 04/14/14 0507 04/15/14 1653 04/16/14 0435 04/17/14 0400  NA 148* 149* 149* 150* 145  K 4.7 4.3 4.1 3.9 4.0  CL 117* 119* 113* 112 110  CO2 17* 16* 21 23 23   GLUCOSE 123* 108* 106* 97 93  BUN 28* 31* 28* 26* 20  CREATININE 2.70* 2.67* 2.28* 2.23* 2.07*  CALCIUM 10.6* 10.1 10.2 9.9 9.3    CBG: No results found for this basename: GLUCAP,  in the last 168 hours  GFR Estimated Creatinine Clearance: 34.8 ml/min (by C-G formula based on Cr of 2.07).  Coagulation profile No results found for this  basename: INR, PROTIME,  in the last 168 hours  Cardiac Enzymes No results found for this basename: CK, CKMB, TROPONINI, MYOGLOBIN,  in the last 168 hours  No components found with this basename: POCBNP,  No results found for this basename: DDIMER,  in the last 72 hours No results found for this basename: HGBA1C,  in the last 72 hours No results found for this basename: CHOL, HDL, LDLCALC, TRIG, CHOLHDL, LDLDIRECT,  in the last 72 hours No results found for this basename: TSH, T4TOTAL, FREET3, T3FREE, THYROIDAB,  in the last 72 hours No results found for this basename: VITAMINB12, FOLATE, FERRITIN, TIBC, IRON, RETICCTPCT,  in the last 72 hours No results found for this basename: LIPASE, AMYLASE,  in the last 72 hours  Urine Studies No results found for this basename: UACOL, UAPR, USPG, UPH, UTP, UGL, UKET, UBIL, UHGB, UNIT, UROB, ULEU, UEPI, UWBC, URBC, UBAC, CAST, CRYS, UCOM, BILUA,  in the last 72 hours  MICROBIOLOGY: Recent Results (from the past 240 hour(s))  URINE CULTURE     Status: None   Collection Time    04/09/14 12:09 PM      Result  Value Ref Range Status   Specimen Description URINE, CLEAN CATCH   Final   Special Requests NONE   Final   Culture  Setup Time     Final   Value: 04/09/2014 19:48     Performed at [facility]     Final   Value: NO GROWTH     Performed at [facility]   Culture     Final   Value: NO GROWTH     Performed at [facility]   Report Status 04/10/2014 FINAL   Final  CULTURE, BLOOD (ROUTINE X 2)     Status: None   Collection Time    04/09/14  3:01 PM      Result Value Ref Range Status   Specimen Description BLOOD FOREARM   Final   Special Requests BOTTLES DRAWN AEROBIC AND ANAEROBIC 6ML   Final   Culture  Setup Time     Final   Value: 04/09/2014 19:05     Performed at [facility]   Culture     Final   Value: NO GROWTH 5 DAYS     Performed at [facility]   Report Status 04/15/2014 FINAL   Final  CULTURE, BLOOD (ROUTINE X 2)     Status: None   Collection Time    04/09/14  3:01 PM      Result Value Ref Range Status   Specimen Description BLOOD BLOOD LEFT FOREARM   Final   Special Requests     Final   Value: BOTTLES DRAWN AEROBIC AND ANAEROBIC 6ML POST ANTIBIOTICS   Culture  Setup Time     Final   Value: 04/09/2014 19:05     Performed at [facility]   Culture     Final   Value: NO GROWTH 5 DAYS     Performed at [facility]   Report Status 04/15/2014 FINAL   Final    RADIOLOGY STUDIES/RESULTS: Dg Chest 2 View  04/09/2014   CLINICAL DATA:  Fever.  EXAM: CHEST  2 VIEW  COMPARISON:  Chest x-ray 01/02/2004.  FINDINGS: Mediastinum and hilar structures are normal. Stable borderline cardiomegaly. Prior CABG. Pacer leads are noted over the chest. Previously identified pacemaker has been removed. Right base atelectasis and/or infiltrate. No significant pleural effusion.  No pneumothorax. No acute bony abnormality.  IMPRESSION: 1. Right base atelectasis and/or infiltrate. Follow-up chest x-ray to demonstrate clearing  suggested. 2. Prior CABG.  Borderline cardiomegaly, no CHF.   Electronically Signed   By: Marcello Moores  Register   On: 04/09/2014 12:52   Ct Head Wo Contrast  04/14/2014   CLINICAL DATA:  Mental status change and confusion  EXAM: CT HEAD WITHOUT CONTRAST  TECHNIQUE: Contiguous axial images were obtained from the base of the skull through the vertex without intravenous contrast.  COMPARISON:  There are no previous studies for comparison.  FINDINGS: The study is limited due to motion artifact. The ventricles are normal in size and position. There is a focus of encephalomalacia in the posterior right parietal lobe. There is no acute intracranial hemorrhage nor objective evidence of an evolving ischemic event. The cerebellum and brainstem are grossly normal.  At bone window settings as best as can be determined there is no acute skull fracture. The observed paranasal sinuses and mastoid air cells are clear.  IMPRESSION: The study is limited due to motion artifact. There is no definite acute intracranial abnormality. Hypodensity in the right posterior parietal lobe is consistent with encephalomalacia.   Electronically Signed   By: David  Martinique   On: 04/14/2014 15:18   US Renal  04/11/2014   CLINICAL DATA:  Acute kidney injury and urinary tract infection  EXAM: RENAL/URINARY TRACT ULTRASOUND COMPLETE  COMPARISON:  None.  FINDINGS: Right Kidney:  Length: 10 cm. Echogenic cortex with abnormal cortical medullary differentiation. There is a 9 mm simple appearing cyst in the interpolar region. No hydronephrosis or evidence of solid mass.  Left Kidney:  Length: 10 cm. Echogenic cortex with abnormal cortical medullary differentiation. No hydronephrosis or evidence of mass.  Bladder:  Completely decompressed around a Foley catheter.  IMPRESSION: 1. No hydronephrosis. 2. Chronic medical renal disease.   Electronically Signed   By: Jorje Guild M.D.   On: 04/11/2014 06:17   Dg Chest Port 1 View  04/11/2014   CLINICAL DATA:   Possible pneumonia.  EXAM: PORTABLE CHEST - 1 VIEW  COMPARISON:  Chest x-ray 04/09/2014.  FINDINGS: Status post median sternotomy. Epicardial wires are seen projecting over the right atrium. Abandoned wires projecting over the upper right hemithorax presumably from prior pacemaker. Lung volumes are low. There are some ill-defined bibasilar opacities favored to reflect some mild subsegmental atelectasis. No consolidative airspace disease. No pleural effusions. No pneumothorax. No evidence of pulmonary edema. Heart size is borderline enlarged. Upper mediastinal contours are unremarkable. Atherosclerosis in the thoracic aorta.  IMPRESSION: 1. Low lung volumes with probable bibasilar subsegmental atelectasis. 2. Atherosclerosis. 3. Postoperative changes and support apparatus, as above.   Electronically Signed   By: Vinnie Langton M.D.   On: 04/11/2014 11:24    Oren Binet, MD  [facility] Pager:[phone]  If 7PM-7AM, please contact night-coverage [email or web address] Password [email or web address] 04/17/2014, 9:40 AM   LOS: 8 days   **Disclaimer: This note may have been dictated with voice recognition software. Similar sounding words can inadvertently be transcribed and this note may contain transcription errors which may not have been corrected upon publication of note.**

## 2014-04-17 NOTE — Evaluation (Signed)
Physical Therapy Evaluation Patient Details Name: Jeffrey Herring MRN: [id number] DOB: 20-May-1951 Today's Date: 04/17/2014   History of Present Illness  Patient is a 63 y/o male with PMH of bipolar disorder, pacemaker placement, diabetes insipidus, DVT, PE, CKD presents with increased confusion and agitation secondary to lithium toxicity per MD. CXR findings suggestive of R base infiltrate, WBC over 12k; CT head (-); psych following and psychiatric admission recommended when stable.   Clinical Impression  Patient presents with cognitive deficits- impaired ability to focus on task at hand, impaired safety awareness/judgment, distractibility and difficulty following commands consistently. Pt requires constant verbal cues to stay engaged and participate in therapy. Pt with generalized weakness from being bed bound from hospitalization. Pt would benefit from skilled therapy in acute care setting so pt can maximize independence and improve overall mobility/safety prior to discharge. Will need to determine pt's PLOF/baseline mobility.    Follow Up Recommendations SNF;Supervision/Assistance - 24 hour    Equipment Recommendations  None recommended by PT (Not sure what equipment pt owns at home - need to speak with family prior to making recommendations.)    Recommendations for Other Services       Precautions / Restrictions Precautions Precautions: Fall;ICD/Pacemaker Restrictions Weight Bearing Restrictions: No      Mobility  Bed Mobility Overal bed mobility: Needs Assistance Bed Mobility: Supine to Sit     Supine to sit: Min assist     General bed mobility comments: Reached hand out to get assist with transfer even after being asked to attempt without help from therapist.  Transfers Overall transfer level: Needs assistance Equipment used: Rolling walker (2 wheeled) Transfers: Sit to/from [facility] Sit to Stand: Mod assist Stand pivot transfers: Min assist        General transfer comment: VC for hand placement and technique. Required manual cues to guide bottom into chair during SPT as pt trying to lean forward over RW to look out window, not following instructions. Impaired safety awareness/judgment.   Ambulation/Gait Ambulation/Gait assistance: Min assist Ambulation Distance (Feet): 3 Feet Assistive device: Rolling walker (2 wheeled) Gait Pattern/deviations: Step-to pattern;Trunk flexed     General Gait Details: Increased knee flexion during gait.  Stairs            Wheelchair Mobility    Modified Rankin (Stroke Patients Only)       Balance Overall balance assessment: Needs assistance Sitting-balance support: Feet supported;No upper extremity supported Sitting balance-Leahy Scale: Poor Sitting balance - Comments: Leaning forearms on thighs as position of comfort.   Standing balance support: Bilateral upper extremity supported;During functional activity Standing balance-Leahy Scale: Poor Standing balance comment: Use of RW for support.                             Pertinent Vitals/Pain No pain reported or no signs/symptoms of pain during evaluation.    Home Living Family/patient expects to be discharged to:: Unsure Living Arrangements: Spouse/significant other               Additional Comments: Pt poor historian and no family members present to provide PLOF/home environment.    Prior Function           Comments: Unsure secondary to cognitive deficits and poor historian.     Hand Dominance        Extremity/Trunk Assessment   Upper Extremity Assessment: Defer to OT evaluation           Lower  Extremity Assessment: Generalized weakness (not able to follow commands consistently to perform formal assessment. Impaired functional strength as exhibited by difficulty standing.)         Communication   Communication:  (Tangential speech unrelated to questions asked; inappropriate comments "Give  me a kiss baby")  Cognition Arousal/Alertness: Awake/alert Behavior During Therapy: Impulsive Overall Cognitive Status: Impaired/Different from baseline Area of Impairment: Orientation;Attention;Following commands;Safety/judgement;Awareness Orientation Level: Disoriented to;Place;Time;Situation ("I had a huge heart attack!") Current Attention Level: Selective   Following Commands: Follows one step commands inconsistently Safety/Judgement: Decreased awareness of safety     General Comments: Pt fidgeting throughout treatment, trying to look out window, leaning too far forward with almost LOB. Requires constant verbal and tactile cues to stay on task and follow commands. Distracted.    General Comments      Exercises        Assessment/Plan    PT Assessment Patient needs continued PT services  PT Diagnosis Difficulty walking;Generalized weakness   PT Problem List Decreased strength;Decreased knowledge of use of DME;Decreased activity tolerance;Decreased safety awareness;Decreased knowledge of precautions;Decreased balance;Decreased mobility  PT Treatment Interventions DME instruction;Balance training;Gait training;Neuromuscular re-education;Cognitive remediation;Functional mobility training;Patient/family education;Therapeutic activities;Therapeutic exercise   PT Goals (Current goals can be found in the Care Plan section) Acute Rehab PT Goals Time For Goal Achievement: 05/01/14 Potential to Achieve Goals: Fair    Frequency Min 2X/week   Barriers to discharge        Co-evaluation               End of Session Equipment Utilized During Treatment: Gait belt Activity Tolerance: Patient tolerated treatment well Patient left: in chair;with chair alarm set;with nursing/sitter in room;with call bell/phone within reach Nurse Communication: Mobility status;Precautions (Educated RN on transfer technique to return to bed.)         Time: [phone] PT Time Calculation (min): 24  min   Charges:   PT Evaluation $Initial PT Evaluation Tier I: 1 Procedure PT Treatments $Therapeutic Activity: 8-22 mins   PT G CodesCandy Sledge A 04/17/2014, 4:44 PM Candy Sledge, PT, DPT [phone]

## 2014-04-17 NOTE — Progress Notes (Signed)
RN has been attempting to give patient meds since 9:45pm. Patient is arousable, but falls right back to sleep when attempting to give medications. He will not stay up long enough to safely administer medications.

## 2014-04-18 DIAGNOSIS — E43 Unspecified severe protein-calorie malnutrition: Secondary | ICD-10-CM

## 2014-04-18 LAB — RENAL FUNCTION PANEL
Albumin: 3.2 g/dL — ABNORMAL LOW (ref 3.5–5.2)
Anion gap: 14 (ref 5–15)
BUN: 17 mg/dL (ref 6–23)
CO2: 23 mEq/L (ref 19–32)
Calcium: 9.5 mg/dL (ref 8.4–10.5)
Chloride: 110 mEq/L (ref 96–112)
Creatinine, Ser: 2.04 mg/dL — ABNORMAL HIGH (ref 0.50–1.35)
GFR calc Af Amer: 38 mL/min — ABNORMAL LOW (ref >90)
GFR calc non Af Amer: 33 mL/min — ABNORMAL LOW (ref >90)
Glucose, Bld: 90 mg/dL (ref 70–99)
Phosphorus: 3.6 mg/dL (ref 2.3–4.6)
Potassium: 4.1 mEq/L (ref 3.7–5.3)
Sodium: 147 mEq/L (ref 137–147)

## 2014-04-18 MED ORDER — DARBEPOETIN ALFA-POLYSORBATE 100 MCG/0.5ML IJ SOLN
100.0000 ug | Freq: Once | INTRAMUSCULAR | Status: AC
  Administered 2014-04-18: 100 ug via SUBCUTANEOUS
  Filled 2014-04-18: qty 0.5

## 2014-04-18 NOTE — Progress Notes (Signed)
PATIENT DETAILS Name: Jeffrey Herring Age: 63 y.o. Sex: male Date of Birth: 1951-10-12 Admit Date: 04/09/2014 Admitting Physician Donne Hazel, MD IN:[id number], MD  Subjective: Continues to improve, Improving,  per RN drinking a lot of water  Assessment/Plan: Principal Problem: Acute encephalopathy - Etiology not very evident, current suspicion for metabolic encephalopathy- given lithium toxicity, worsening renal failure, UTI/community acquired pneumonia. Per Neurology mental status expected to slowly improve - CT head on 6/30 negative. Unable to do MRI as the patient has a cardiac pacemaker.EEG negative for epileptiform waves.  -Neurology/Psych following, since slowly improving, suspect discharge to Geri-psych early next week.  Active Problems: Acute on chronic kidney disease stage III - Multi-factorial etiology for acute renal failure-including eating toxicity, hypercalcemia, and obstructive uropathy. Nephrology consulted, supportive care given, creatinine significantly improved and now close to her usual baseline.  Acute urinary retention - Resolved. Temporarily required placement of Foley catheter. Renal ultrasound did not show any hydronephrosis. - Currently voiding spontaneously, has a condom catheter in place. Continue Flomax  Hypernatremia -? Developing DI secondary to Lithium toxicity - Monitor periodically. Cautious to continue with fluids. Per RN patient now starting to drink, will continue to encourage oral intake  Metabolic acidosis - Secondary to worsening kidney function on IV fluids with bicarbonate.  Suspected community acquired pneumonia - Remains afebrile, no leukocytosis. Does not have a toxic appearance. - Has finished a course of IV antibiotics including Rocephin/Zithromax, with transition to oral Levaquin on 6/29 for 2 doses- last dose on 7/1.  Long-standing history of bipolar disorder - Presented Lithium toxicity, has been evaluated by  psychiatry- he then discontinued permanently-started on Zyprexa  History of chronic systolic heart failure - Clinically compensated. Continue to monitor carefully while on IV fluids. - ACE inhibitor discontinued given worsening kidney function, continue cautiously with beta blocker blood pressure permitting.  History of complete heart block - Pacemaker in place.  History of venous thromboembolism - Previously was on Coumadin- currently no evidence of any recurrence. On heparin for DVT prophylaxis.  Severe Malnutrition -c/w Ensure Complete po BID  Disposition: Remain inpatient  DVT Prophylaxis: Prophylactic Heparin  Code Status: Full code   Family Communication None at bedside  Procedures:  None  CONSULTS:  nephrology, neurology and psychiatry   MEDICATIONS: Scheduled Meds: . ALPRAZolam  1 mg Oral TID  . clopidogrel  75 mg Oral q morning - 10a  . darbepoetin (ARANESP) injection - NON-DIALYSIS  100 mcg Subcutaneous Q Sat-1800  . feeding supplement (ENSURE COMPLETE)  237 mL Oral BID BM  . heparin  5,000 Units Subcutaneous 3 times per day  . metoprolol succinate  25 mg Oral q morning - 10a  . OLANZapine zydis  5 mg Oral QHS  . pneumococcal 23 valent vaccine  0.5 mL Intramuscular Tomorrow-1000  . polyethylene glycol  17 g Oral Daily  . senna  2 tablet Oral QHS  . simvastatin  10 mg Oral QHS  . sodium bicarbonate  1,300 mg Oral BID  . sodium chloride  3 mL Intravenous Q12H  . tamsulosin  0.4 mg Oral q morning - 10a  . traZODone  100 mg Oral QHS   Continuous Infusions: . sodium chloride 125 mL/hr at 04/18/14 0514   PRN Meds:.acetaminophen, acetaminophen, haloperidol lactate, LORazepam, ondansetron (ZOFRAN) IV, ondansetron  Antibiotics: Anti-infectives   Start     Dose/Rate Route Frequency Ordered Stop   04/13/14 1200  levofloxacin (LEVAQUIN) tablet 500 mg  500 mg Oral Every 48 hours 04/13/14 1143 04/15/14 1200   04/10/14 1400  cefTRIAXone (ROCEPHIN) 1 g in  dextrose 5 % 50 mL IVPB  Status:  Discontinued     1 g 100 mL/hr over 30 Minutes Intravenous Every 24 hours 04/09/14 1453 04/13/14 1143   04/10/14 1000  azithromycin (ZITHROMAX) tablet 500 mg  Status:  Discontinued     500 mg Oral Every 24 hours 04/09/14 1453 04/13/14 1143   04/09/14 1500  cefTRIAXone (ROCEPHIN) 1 g in dextrose 5 % 50 mL IVPB  Status:  Discontinued     1 g 100 mL/hr over 30 Minutes Intravenous Every 24 hours 04/09/14 1448 04/09/14 1453   04/09/14 1500  azithromycin (ZITHROMAX) tablet 500 mg  Status:  Discontinued     500 mg Oral Every 24 hours 04/09/14 1448 04/09/14 1453   04/09/14 1330  cefTRIAXone (ROCEPHIN) 1 g in dextrose 5 % 50 mL IVPB     1 g 100 mL/hr over 30 Minutes Intravenous  Once 04/09/14 1316 04/09/14 1451   04/09/14 1330  azithromycin (ZITHROMAX) 500 mg in dextrose 5 % 250 mL IVPB     500 mg 250 mL/hr over 60 Minutes Intravenous  Once 04/09/14 1316 04/09/14 1600       PHYSICAL EXAM: Vital signs in last 24 hours: Filed Vitals:   04/17/14 1317 04/17/14 2009 04/17/14 2050 04/18/14 0432  BP: 118/79 123/70 138/84 133/80  Pulse: 67 65 76 62  Temp: 97.8 F (36.6 C) 99 F (37.2 C) 98.2 F (36.8 C) 97.7 F (36.5 C)  TempSrc: Oral Oral Oral Oral  Resp: 18 18 18 18   Height:      Weight:      SpO2: 97% 98% 92% 62%    Weight change:  Filed Weights   04/09/14 1820  Weight: 68.221 kg (150 lb 6.4 oz)   Body mass index is 21.58 kg/(m^2).   Gen Exam: Awake, and much more alert. Answers a lot more questions appropriately today Neck: Supple, No JVD.   Chest: B/L Clear.   CVS: S1 S2 Regular, no murmurs.  Abdomen: soft, BS +, non tender, non distended.  Extremities: no edema, lower extremities warm to touch. Neurologic: Non Focal.   Skin: No Rash.   Wounds: N/A.   Intake/Output from previous day:  Intake/Output Summary (Last 24 hours) at 04/18/14 0840 Last data filed at 04/18/14 0514  Gross per 24 hour  Intake   4480 ml  Output   4000 ml  Net     480 ml     LAB RESULTS: CBC  Recent Labs Lab 04/17/14 0400  WBC 6.9  HGB 9.5*  HCT 30.7*  PLT 132*  MCV 99.0  MCH 30.6  MCHC 30.9  RDW 15.4    Chemistries   Recent Labs Lab 04/14/14 0507 04/15/14 1653 04/16/14 0435 04/17/14 0400 04/18/14 0417  NA 149* 149* 150* 145 147  K 4.3 4.1 3.9 4.0 4.1  CL 119* 113* 112 110 110  CO2 16* 21 23 23 23   GLUCOSE 108* 106* 97 93 90  BUN 31* 28* 26* 20 17  CREATININE 2.67* 2.28* 2.23* 2.07* 2.04*  CALCIUM 10.1 10.2 9.9 9.3 9.5    CBG: No results found for this basename: GLUCAP,  in the last 168 hours  GFR Estimated Creatinine Clearance: 35.3 ml/min (by C-G formula based on Cr of 2.04).  Coagulation profile No results found for this basename: INR, PROTIME,  in the last 168 hours  Cardiac Enzymes  No results found for this basename: CK, CKMB, TROPONINI, MYOGLOBIN,  in the last 168 hours  No components found with this basename: POCBNP,  No results found for this basename: DDIMER,  in the last 72 hours No results found for this basename: HGBA1C,  in the last 72 hours No results found for this basename: CHOL, HDL, LDLCALC, TRIG, CHOLHDL, LDLDIRECT,  in the last 72 hours No results found for this basename: TSH, T4TOTAL, FREET3, T3FREE, THYROIDAB,  in the last 72 hours No results found for this basename: VITAMINB12, FOLATE, FERRITIN, TIBC, IRON, RETICCTPCT,  in the last 72 hours No results found for this basename: LIPASE, AMYLASE,  in the last 72 hours  Urine Studies No results found for this basename: UACOL, UAPR, USPG, UPH, UTP, UGL, UKET, UBIL, UHGB, UNIT, UROB, ULEU, UEPI, UWBC, URBC, UBAC, CAST, CRYS, UCOM, BILUA,  in the last 72 hours  MICROBIOLOGY: Recent Results (from the past 240 hour(s))  URINE CULTURE     Status: None   Collection Time    04/09/14 12:09 PM      Result Value Ref Range Status   Specimen Description URINE, CLEAN CATCH   Final   Special Requests NONE   Final   Culture  Setup Time     Final   Value:  04/09/2014 19:48     Performed at [place]     Final   Value: NO GROWTH     Performed at [facility]   Culture     Final   Value: NO GROWTH     Performed at [facility]   Report Status 04/10/2014 FINAL   Final  CULTURE, BLOOD (ROUTINE X 2)     Status: None   Collection Time    04/09/14  3:01 PM      Result Value Ref Range Status   Specimen Description BLOOD FOREARM   Final   Special Requests BOTTLES DRAWN AEROBIC AND ANAEROBIC 6ML   Final   Culture  Setup Time     Final   Value: 04/09/2014 19:05     Performed at [facility]   Culture     Final   Value: NO GROWTH 5 DAYS     Performed at [facility]   Report Status 04/15/2014 FINAL   Final  CULTURE, BLOOD (ROUTINE X 2)     Status: None   Collection Time    04/09/14  3:01 PM      Result Value Ref Range Status   Specimen Description BLOOD BLOOD LEFT FOREARM   Final   Special Requests     Final   Value: BOTTLES DRAWN AEROBIC AND ANAEROBIC 6ML POST ANTIBIOTICS   Culture  Setup Time     Final   Value: 04/09/2014 19:05     Performed at [facility]   Culture     Final   Value: NO GROWTH 5 DAYS     Performed at [facility]   Report Status 04/15/2014 FINAL   Final    RADIOLOGY STUDIES/RESULTS: Dg Chest 2 View  04/09/2014   CLINICAL DATA:  Fever.  EXAM: CHEST  2 VIEW  COMPARISON:  Chest x-ray 01/02/2004.  FINDINGS: Mediastinum and hilar structures are normal. Stable borderline cardiomegaly. Prior CABG. Pacer leads are noted over the chest. Previously identified pacemaker has been removed. Right base atelectasis and/or infiltrate. No significant pleural effusion. No pneumothorax. No acute bony abnormality.  IMPRESSION: 1. Right base atelectasis  and/or infiltrate. Follow-up chest x-ray to demonstrate clearing suggested. 2. Prior CABG.  Borderline cardiomegaly, no CHF.   Electronically Signed   By: Marcello Moores  Register   On: 04/09/2014 12:52   Ct Head Wo  Contrast  04/14/2014   CLINICAL DATA:  Mental status change and confusion  EXAM: CT HEAD WITHOUT CONTRAST  TECHNIQUE: Contiguous axial images were obtained from the base of the skull through the vertex without intravenous contrast.  COMPARISON:  There are no previous studies for comparison.  FINDINGS: The study is limited due to motion artifact. The ventricles are normal in size and position. There is a focus of encephalomalacia in the posterior right parietal lobe. There is no acute intracranial hemorrhage nor objective evidence of an evolving ischemic event. The cerebellum and brainstem are grossly normal.  At bone window settings as best as can be determined there is no acute skull fracture. The observed paranasal sinuses and mastoid air cells are clear.  IMPRESSION: The study is limited due to motion artifact. There is no definite acute intracranial abnormality. Hypodensity in the right posterior parietal lobe is consistent with encephalomalacia.   Electronically Signed   By: David  Martinique   On: 04/14/2014 15:18   US Renal  04/11/2014   CLINICAL DATA:  Acute kidney injury and urinary tract infection  EXAM: RENAL/URINARY TRACT ULTRASOUND COMPLETE  COMPARISON:  None.  FINDINGS: Right Kidney:  Length: 10 cm. Echogenic cortex with abnormal cortical medullary differentiation. There is a 9 mm simple appearing cyst in the interpolar region. No hydronephrosis or evidence of solid mass.  Left Kidney:  Length: 10 cm. Echogenic cortex with abnormal cortical medullary differentiation. No hydronephrosis or evidence of mass.  Bladder:  Completely decompressed around a Foley catheter.  IMPRESSION: 1. No hydronephrosis. 2. Chronic medical renal disease.   Electronically Signed   By: Jorje Guild M.D.   On: 04/11/2014 06:17   Dg Chest Port 1 View  04/11/2014   CLINICAL DATA:  Possible pneumonia.  EXAM: PORTABLE CHEST - 1 VIEW  COMPARISON:  Chest x-ray 04/09/2014.  FINDINGS: Status post median sternotomy. Epicardial  wires are seen projecting over the right atrium. Abandoned wires projecting over the upper right hemithorax presumably from prior pacemaker. Lung volumes are low. There are some ill-defined bibasilar opacities favored to reflect some mild subsegmental atelectasis. No consolidative airspace disease. No pleural effusions. No pneumothorax. No evidence of pulmonary edema. Heart size is borderline enlarged. Upper mediastinal contours are unremarkable. Atherosclerosis in the thoracic aorta.  IMPRESSION: 1. Low lung volumes with probable bibasilar subsegmental atelectasis. 2. Atherosclerosis. 3. Postoperative changes and support apparatus, as above.   Electronically Signed   By: Vinnie Langton M.D.   On: 04/11/2014 11:24    Oren Binet, MD  [facility] Pager:[phone]  If 7PM-7AM, please contact night-coverage [email or web address] Password [email or web address] 04/18/2014, 8:40 AM   LOS: 9 days   **Disclaimer: This note may have been dictated with voice recognition software. Similar sounding words can inadvertently be transcribed and this note may contain transcription errors which may not have been corrected upon publication of note.**

## 2014-04-19 LAB — RENAL FUNCTION PANEL
Albumin: 3 g/dL — ABNORMAL LOW (ref 3.5–5.2)
Anion gap: 13 (ref 5–15)
BUN: 14 mg/dL (ref 6–23)
CO2: 22 mEq/L (ref 19–32)
Calcium: 9 mg/dL (ref 8.4–10.5)
Chloride: 109 mEq/L (ref 96–112)
Creatinine, Ser: 2 mg/dL — ABNORMAL HIGH (ref 0.50–1.35)
GFR calc Af Amer: 39 mL/min — ABNORMAL LOW (ref >90)
GFR calc non Af Amer: 34 mL/min — ABNORMAL LOW (ref >90)
Glucose, Bld: 106 mg/dL — ABNORMAL HIGH (ref 70–99)
Phosphorus: 3.1 mg/dL (ref 2.3–4.6)
Potassium: 3.9 mEq/L (ref 3.7–5.3)
Sodium: 144 mEq/L (ref 137–147)

## 2014-04-19 MED ORDER — OLANZAPINE 5 MG PO TBDP: 7.5000 mg | ORAL_TABLET | Freq: Every day | ORAL | Status: DC

## 2014-04-19 MED ORDER — OLANZAPINE 10 MG PO TBDP
10.0000 mg | ORAL_TABLET | Freq: Every day | ORAL | Status: DC
  Administered 2014-04-19 – 2014-04-22 (×4): 10 mg via ORAL
  Filled 2014-04-19 (×5): qty 1

## 2014-04-19 MED ORDER — SODIUM CHLORIDE 0.45 % IV SOLN
INTRAVENOUS | Status: DC
  Administered 2014-04-19 – 2014-04-22 (×6): via INTRAVENOUS

## 2014-04-19 MED ORDER — HALOPERIDOL LACTATE 5 MG/ML IJ SOLN
2.5000 mg | Freq: Once | INTRAMUSCULAR | Status: AC
  Administered 2014-04-19: 2.5 mg via INTRAVENOUS

## 2014-04-19 NOTE — Progress Notes (Signed)
Patient is aggravated and hitting staff. He is yelling and trying to punch NT. RN will be giving PRN haldol dose. Will continue to monitor

## 2014-04-19 NOTE — Consult Note (Signed)
[facility] Consult   Reason for Consult:  Bipolar disorder, lithium toxicity and AMS Referring Physician:  Dr. Maryfrances Bunnell Jeffrey Herring is an 63 y.o. male. Total Time spent with patient: 45 minutes  Assessment: AXIS I:  Bipolar, Depressed AXIS II:  Deferred AXIS III:   Past Medical History  Diagnosis Date  . Hyperlipemia   . Pacemaker   . Arrhythmia   . Diabetes insipidus   . History of kidney stones   . History of DVT (deep vein thrombosis)   . History of pulmonary embolism    AXIS IV:  other psychosocial or environmental problems, problems related to social environment and problems with primary support group AXIS V:  31-40 impairment in reality testing  Plan:  Psychiatric social service involved finding geriatric psychiatric bed. Ms. Hunt Oris covering for psych social service. Recommend psychiatric Inpatient admission when medically cleared. Supportive therapy provided about ongoing stressors. Patient benefit from Geriatric psychiatry when medcially stable, Contact psych social service regarding appropriate placement. Increased Zyprexa Zydis 10 mg PO Qhs for bipolar manic symptoms  May use Haldol and Ativan as needed for agitation and aggressive behaviors Appreciate psychiatric consultation and followup as clinically required  Subjective:   Jeffrey Herring is a 63 y.o. male patient admitted with AMS.  HPI: Patient was seen and chart reviewed. Patient was not able to contribute to the evaluation secondary to being sedated with medication. Patient has renal sonogram planned for today. Staff RN reported patient has been irritable, restless and poorly cooperative.   Interval History: Patient appeared sitting in his chair,  Awake, alert and poorly oriented. Patient has a Air cabin crew in his room who informed that patient has been quite busy and constantly moving around and change in mood swings and needed repeated redirection. Reportedly patient has slowly  improving  Clinically and status post lithium toxicity. Patient has  better cognitive improvement, he is more verbally responds and continue to be confused. He can not hold a conversation at this time but able answer few direct questions and has inappropriate affect and behaviors like showing his private part etc. Patient grandson was hospitalized in [place] about a week ago due to heart problems and his wife stated she needs a Social worker for herself. Will continue his zyprexa and may consider Lamictal when appropriate..    Medical history: Jeffrey Herring is a 63 y.o. male With a hx of bipolar disorder, DI, hx pacemaker placement who presents to the ED with increased confusion and agitation over the past week prior to admi. In the ED, the patient was found to have a low grade temp of 100F with CXR findings suggestive of R base infiltrate with WBC of over 12K and Cr of 3.12 (previous of over 2). The patient was started on empiric azithromycin and rocephin and the hospitalist service consulted for consideration for admission.   Review of Systems:  Per above, the remainder of the 10pt ros reviewed and are neg   HPI Elements:  Location:  Lithium toxicity. Quality:  Sedation and confusion. Severity:  Acute. Timing:  Unknown causes of lithium toxicity.  Past Psychiatric History: Past Medical History  Diagnosis Date  . Hyperlipemia   . Pacemaker   . Arrhythmia   . Diabetes insipidus   . History of kidney stones   . History of DVT (deep vein thrombosis)   . History of pulmonary embolism     reports that he has been smoking Cigars.  He has never used smokeless  tobacco. He reports that he does not drink alcohol or use illicit drugs. Family History  Problem Relation Age of Onset  . Depression Mother      Living Arrangements: Spouse/significant other   Abuse/Neglect [facility]) Physical Abuse: Denies Verbal Abuse: Denies Sexual Abuse: Denies Allergies:   Allergies  Allergen Reactions  . Aspirin  Hives  . Codeine Nausea And Vomiting  . Demerol [Meperidine] Nausea And Vomiting    ACT Assessment Complete:  NO Objective: Blood pressure 109/59, pulse 76, temperature 97.8 F (36.6 C), temperature source Oral, resp. rate 14, height '5\' 10"'  (1.778 m), weight 68.221 kg (150 lb 6.4 oz), SpO2 93.00%.Body mass index is 21.58 kg/(m^2). Results for orders placed during the hospital encounter of 04/09/14 (from the past 72 hour(s))  RENAL FUNCTION PANEL     Status: Abnormal   Collection Time    04/17/14  4:00 AM      Result Value Ref Range   Sodium 145  137 - 147 mEq/L   Potassium 4.0  3.7 - 5.3 mEq/L   Chloride 110  96 - 112 mEq/L   CO2 23  19 - 32 mEq/L   Glucose, Bld 93  70 - 99 mg/dL   BUN 20  6 - 23 mg/dL   Creatinine, Ser 2.07 (*) 0.50 - 1.35 mg/dL   Calcium 9.3  8.4 - 10.5 mg/dL   Phosphorus 2.8  2.3 - 4.6 mg/dL   Albumin 3.1 (*) 3.5 - 5.2 g/dL   GFR calc non Af Amer 32 (*) >90 mL/min   GFR calc Af Amer 37 (*) >90 mL/min   Comment: (NOTE)     The eGFR has been calculated using the CKD EPI equation.     This calculation has not been validated in all clinical situations.     eGFR's persistently <90 mL/min signify possible Chronic Kidney     Disease.   Anion gap 12  5 - 15  CBC     Status: Abnormal   Collection Time    04/17/14  4:00 AM      Result Value Ref Range   WBC 6.9  4.0 - 10.5 K/uL   RBC 3.10 (*) 4.22 - 5.81 MIL/uL   Hemoglobin 9.5 (*) 13.0 - 17.0 g/dL   HCT 30.7 (*) 39.0 - 52.0 %   MCV 99.0  78.0 - 100.0 fL   MCH 30.6  26.0 - 34.0 pg   MCHC 30.9  30.0 - 36.0 g/dL   RDW 15.4  11.5 - 15.5 %   Platelets 132 (*) 150 - 400 K/uL  RENAL FUNCTION PANEL     Status: Abnormal   Collection Time    04/18/14  4:17 AM      Result Value Ref Range   Sodium 147  137 - 147 mEq/L   Potassium 4.1  3.7 - 5.3 mEq/L   Chloride 110  96 - 112 mEq/L   CO2 23  19 - 32 mEq/L   Glucose, Bld 90  70 - 99 mg/dL   BUN 17  6 - 23 mg/dL   Creatinine, Ser 2.04 (*) 0.50 - 1.35 mg/dL   Calcium  9.5  8.4 - 10.5 mg/dL   Phosphorus 3.6  2.3 - 4.6 mg/dL   Albumin 3.2 (*) 3.5 - 5.2 g/dL   GFR calc non Af Amer 33 (*) >90 mL/min   GFR calc Af Amer 38 (*) >90 mL/min   Comment: (NOTE)     The eGFR has been calculated using the  CKD EPI equation.     This calculation has not been validated in all clinical situations.     eGFR's persistently <90 mL/min signify possible Chronic Kidney     Disease.   Anion gap 14  5 - 15  RENAL FUNCTION PANEL     Status: Abnormal   Collection Time    04/19/14  5:02 AM      Result Value Ref Range   Sodium 144  137 - 147 mEq/L   Potassium 3.9  3.7 - 5.3 mEq/L   Chloride 109  96 - 112 mEq/L   CO2 22  19 - 32 mEq/L   Glucose, Bld 106 (*) 70 - 99 mg/dL   BUN 14  6 - 23 mg/dL   Creatinine, Ser 2.00 (*) 0.50 - 1.35 mg/dL   Calcium 9.0  8.4 - 10.5 mg/dL   Phosphorus 3.1  2.3 - 4.6 mg/dL   Albumin 3.0 (*) 3.5 - 5.2 g/dL   GFR calc non Af Amer 34 (*) >90 mL/min   GFR calc Af Amer 39 (*) >90 mL/min   Comment: (NOTE)     The eGFR has been calculated using the CKD EPI equation.     This calculation has not been validated in all clinical situations.     eGFR's persistently <90 mL/min signify possible Chronic Kidney     Disease.   Anion gap 13  5 - 15   Labs are reviewed and are pertinent for lithium toxicity and increased bun and cr.  Current Facility-Administered Medications  Medication Dose Route Frequency Provider Last Rate Last Dose  . 0.45 % sodium chloride infusion   Intravenous Continuous Jonetta Osgood, MD 50 mL/hr at 04/19/14 [phone]    . acetaminophen (TYLENOL) tablet 650 mg  650 mg Oral Q6H PRN Donne Hazel, MD       Or  . acetaminophen (TYLENOL) suppository 650 mg  650 mg Rectal Q6H PRN Donne Hazel, MD      . ALPRAZolam [name]) tablet 1 mg  1 mg Oral TID Donne Hazel, MD   1 mg at 04/19/14 [phone]  . clopidogrel (PLAVIX) tablet 75 mg  75 mg Oral q morning - 10a Donne Hazel, MD   75 mg at 04/19/14 0943  . darbepoetin (ARANESP) injection  100 mcg  100 mcg Subcutaneous Q Sat-1800 Louis Meckel, MD   100 mcg at 04/11/14 1845  . feeding supplement (ENSURE COMPLETE) (ENSURE COMPLETE) liquid 237 mL  237 mL Oral BID BM Heather Cornelison Pitts, RD   237 mL at 04/19/14 1456  . haloperidol lactate (HALDOL) injection 5 mg  5 mg Intravenous Q6H PRN Donne Hazel, MD   5 mg at 04/19/14 1456  . heparin injection 5,000 Units  5,000 Units Subcutaneous 3 times per day Donne Hazel, MD   5,000 Units at 04/19/14 1456  . LORazepam (ATIVAN) injection 1 mg  1 mg Intravenous Q6H PRN Verlee Monte, MD   1 mg at 04/19/14 1112  . metoprolol succinate (TOPROL-XL) 24 hr tablet 25 mg  25 mg Oral q morning - 10a Donne Hazel, MD   25 mg at 04/19/14 [phone]  . OLANZapine zydis (ZYPREXA) disintegrating tablet 7.5 mg  7.5 mg Oral QHS Durward Parcel, MD      . ondansetron (ZOFRAN) tablet 4 mg  4 mg Oral Q6H PRN Donne Hazel, MD       Or  . ondansetron [facility]) injection 4 mg  4  mg Intravenous Q6H PRN Donne Hazel, MD      . pneumococcal 23 valent vaccine (PNU-IMMUNE) injection 0.5 mL  0.5 mL Intramuscular Tomorrow-1000 Verlee Monte, MD      . polyethylene glycol (MIRALAX / GLYCOLAX) packet 17 g  17 g Oral Daily Verlee Monte, MD   17 g at 04/19/14 0943  . senna (SENOKOT) tablet 17.2 mg  2 tablet Oral QHS Ritta Slot, NP   17.2 mg at 04/18/14 2113  . simvastatin (ZOCOR) tablet 10 mg  10 mg Oral QHS Donne Hazel, MD   10 mg at 04/18/14 2113  . sodium bicarbonate tablet 1,300 mg  1,300 mg Oral BID Louis Meckel, MD   1,300 mg at 04/19/14 0943  . sodium chloride 0.9 % injection 3 mL  3 mL Intravenous Q12H Donne Hazel, MD   3 mL at 04/17/14 2351  . tamsulosin (FLOMAX) capsule 0.4 mg  0.4 mg Oral q morning - 10a Donne Hazel, MD   0.4 mg at 04/19/14 0943  . traZODone (DESYREL) tablet 100 mg  100 mg Oral QHS Donne Hazel, MD   100 mg at 04/18/14 2113    Psychiatric Specialty Exam: Physical Exam  ROS  Blood pressure 109/59,  pulse 76, temperature 97.8 F (36.6 C), temperature source Oral, resp. rate 14, height '5\' 10"'  (1.778 m), weight 68.221 kg (150 lb 6.4 oz), SpO2 93.00%.Body mass index is 21.58 kg/(m^2).  General Appearance: Guarded  Eye Contact::  Fair  Speech:  Blocked  Volume:  Normal  Mood:  Anxious and Depressed  Affect:  Non-Congruent and Inappropriate  Thought Process:  Coherent and Goal Directed  Orientation:  NA  Thought Content:  Rumination  Suicidal Thoughts:  No  Homicidal Thoughts:  No  Memory:  Remote;   Fair  Judgement:  Impaired  Insight:  NA and Lacking  Psychomotor Activity:  Restlessness  Concentration:  Poor  Recall:  Poor  Fund of Knowledge:Fair  Language: Fair  Akathisia:  NA  Handed:  Right  AIMS (if indicated):     Assets:  Financial Resources/Insurance Housing Intimacy Leisure Time Resilience Social Support Talents/Skills  Sleep:      Musculoskeletal: Strength & Muscle Tone: decreased Gait & Station: unable to stand Patient leans: N/A  Treatment Plan Summary: Daily contact with patient to assess and evaluate symptoms and progress in treatment Medication management  Laakea Pereira,JANARDHAHA R. 04/19/2014 4:42 PM

## 2014-04-19 NOTE — Progress Notes (Signed)
RN giving 2.5mg  haldol one time dose.

## 2014-04-19 NOTE — Progress Notes (Signed)
PATIENT DETAILS Name: Jeffrey Herring Age: 63 y.o. Sex: male Date of Birth: [birth year] Admit Date: 04/09/2014 Admitting Physician Donne Hazel, MD IN:[id number], MD  Subjective: Essentially unchanged  Assessment/Plan: Principal Problem: Acute encephalopathy - Etiology multifacorial, current suspicion for metabolic encephalopathy- given lithium toxicity, worsening renal failure, UTI/community acquired pneumonia. Per Neurology mental status expected to slowly improve - CT head on 6/30 negative. Unable to do MRI as the patient has a cardiac pacemaker.EEG negative for epileptiform waves.  -Neurology/Psych following, since slowly improving, suspect discharge to Geri-psych early next week.  Active Problems: Acute on chronic kidney disease stage III - Multi-factorial etiology for acute renal failure-including eating toxicity, hypercalcemia, and obstructive uropathy. Nephrology consulted, supportive care given, creatinine significantly improved and now close to her usual baseline.  Acute urinary retention - Resolved. Temporarily required placement of Foley catheter. Renal ultrasound did not show any hydronephrosis. - Currently voiding spontaneously, has a condom catheter in place. Continue Flomax  Hypernatremia -? Developing DI secondary to Lithium toxicity - Monitor periodically. Cautious to continue with fluids. Per RN patient now starting to drink, will continue to encourage oral intake  Metabolic acidosis - Secondary to worsening kidney function -better. C/w oral bicarbonate.  Suspected community acquired pneumonia - Remains afebrile, no leukocytosis. Does not have a toxic appearance. - Has finished a course of IV antibiotics including Rocephin/Zithromax, with transition to oral Levaquin on 6/29 for 2 doses- last dose on 7/1.  Long-standing history of bipolar disorder - Presented with Lithium toxicity, has been evaluated by psychiatry-Lithium discontinued  permanently-started on Zyprexa  History of chronic systolic heart failure - Clinically compensated. Continue to monitor carefully while on IV fluids. - ACE inhibitor discontinued given worsening kidney function, continue cautiously with beta blocker blood pressure permitting.  History of complete heart block - Pacemaker in place.  History of venous thromboembolism - Previously was on Coumadin- currently no evidence of any recurrence. On heparin for DVT prophylaxis.  Severe Malnutrition -c/w Ensure Complete po BID  Disposition: Remain inpatient  DVT Prophylaxis: Prophylactic Heparin  Code Status: Full code   Family Communication Wife-Marriane-over the phone 7/4  Procedures:  None  CONSULTS:  nephrology, neurology and psychiatry   MEDICATIONS: Scheduled Meds: . ALPRAZolam  1 mg Oral TID  . clopidogrel  75 mg Oral q morning - 10a  . darbepoetin (ARANESP) injection - NON-DIALYSIS  100 mcg Subcutaneous Q Sat-1800  . feeding supplement (ENSURE COMPLETE)  237 mL Oral BID BM  . heparin  5,000 Units Subcutaneous 3 times per day  . metoprolol succinate  25 mg Oral q morning - 10a  . OLANZapine zydis  5 mg Oral QHS  . pneumococcal 23 valent vaccine  0.5 mL Intramuscular Tomorrow-1000  . polyethylene glycol  17 g Oral Daily  . senna  2 tablet Oral QHS  . simvastatin  10 mg Oral QHS  . sodium bicarbonate  1,300 mg Oral BID  . sodium chloride  3 mL Intravenous Q12H  . tamsulosin  0.4 mg Oral q morning - 10a  . traZODone  100 mg Oral QHS   Continuous Infusions: . sodium chloride 75 mL/hr at 04/19/14 0312   PRN Meds:.acetaminophen, acetaminophen, haloperidol lactate, LORazepam, ondansetron (ZOFRAN) IV, ondansetron  Antibiotics: Anti-infectives   Start     Dose/Rate Route Frequency Ordered Stop   04/13/14 1200  levofloxacin (LEVAQUIN) tablet 500 mg     500 mg Oral Every 48 hours 04/13/14 1143 04/15/14 1200   04/10/14  1400  cefTRIAXone (ROCEPHIN) 1 g in dextrose 5 % 50 mL  IVPB  Status:  Discontinued     1 g 100 mL/hr over 30 Minutes Intravenous Every 24 hours 04/09/14 1453 04/13/14 1143   04/10/14 1000  azithromycin (ZITHROMAX) tablet 500 mg  Status:  Discontinued     500 mg Oral Every 24 hours 04/09/14 1453 04/13/14 1143   04/09/14 1500  cefTRIAXone (ROCEPHIN) 1 g in dextrose 5 % 50 mL IVPB  Status:  Discontinued     1 g 100 mL/hr over 30 Minutes Intravenous Every 24 hours 04/09/14 1448 04/09/14 1453   04/09/14 1500  azithromycin (ZITHROMAX) tablet 500 mg  Status:  Discontinued     500 mg Oral Every 24 hours 04/09/14 1448 04/09/14 1453   04/09/14 1330  cefTRIAXone (ROCEPHIN) 1 g in dextrose 5 % 50 mL IVPB     1 g 100 mL/hr over 30 Minutes Intravenous  Once 04/09/14 1316 04/09/14 1451   04/09/14 1330  azithromycin (ZITHROMAX) 500 mg in dextrose 5 % 250 mL IVPB     500 mg 250 mL/hr over 60 Minutes Intravenous  Once 04/09/14 1316 04/09/14 1600       PHYSICAL EXAM: Vital signs in last 24 hours: Filed Vitals:   04/18/14 0432 04/18/14 1049 04/18/14 2249 04/19/14 0557  BP: 133/80 126/75 107/60 119/70  Pulse: 62 75 60 91  Temp: 97.7 F (36.5 C)  98.2 F (36.8 C) 98.1 F (36.7 C)  TempSrc: Oral  Oral Axillary  Resp: 18  16 16   Height:      Weight:      SpO2: 62%  94% 92%    Weight change:  Filed Weights   04/09/14 1820  Weight: 68.221 kg (150 lb 6.4 oz)   Body mass index is 21.58 kg/(m^2).   Gen Exam: Awake, pleasantly confused, continues to answers some questions appropriately today Neck: Supple, No JVD.   Chest: B/L Clear.   CVS: S1 S2 Regular, no murmurs.  Abdomen: soft, BS +, non tender, non distended.  Extremities: no edema, lower extremities warm to touch. Neurologic: Non Focal.   Skin: No Rash.   Wounds: N/A.   Intake/Output from previous day:  Intake/Output Summary (Last 24 hours) at 04/19/14 0814 Last data filed at 04/19/14 0556  Gross per 24 hour  Intake    960 ml  Output   5050 ml  Net  -4090 ml     LAB  RESULTS: CBC  Recent Labs Lab 04/17/14 0400  WBC 6.9  HGB 9.5*  HCT 30.7*  PLT 132*  MCV 99.0  MCH 30.6  MCHC 30.9  RDW 15.4    Chemistries   Recent Labs Lab 04/15/14 1653 04/16/14 0435 04/17/14 0400 04/18/14 0417 04/19/14 0502  NA 149* 150* 145 147 144  K 4.1 3.9 4.0 4.1 3.9  CL 113* 112 110 110 109  CO2 21 23 23 23 22   GLUCOSE 106* 97 93 90 106*  BUN 28* 26* 20 17 14   CREATININE 2.28* 2.23* 2.07* 2.04* 2.00*  CALCIUM 10.2 9.9 9.3 9.5 9.0    CBG: No results found for this basename: GLUCAP,  in the last 168 hours  GFR Estimated Creatinine Clearance: 36 ml/min (by C-G formula based on Cr of 2).  Coagulation profile No results found for this basename: INR, PROTIME,  in the last 168 hours  Cardiac Enzymes No results found for this basename: CK, CKMB, TROPONINI, MYOGLOBIN,  in the last 168 hours  No components  found with this basename: POCBNP,  No results found for this basename: DDIMER,  in the last 72 hours No results found for this basename: HGBA1C,  in the last 72 hours No results found for this basename: CHOL, HDL, LDLCALC, TRIG, CHOLHDL, LDLDIRECT,  in the last 72 hours No results found for this basename: TSH, T4TOTAL, FREET3, T3FREE, THYROIDAB,  in the last 72 hours No results found for this basename: VITAMINB12, FOLATE, FERRITIN, TIBC, IRON, RETICCTPCT,  in the last 72 hours No results found for this basename: LIPASE, AMYLASE,  in the last 72 hours  Urine Studies No results found for this basename: UACOL, UAPR, USPG, UPH, UTP, UGL, UKET, UBIL, UHGB, UNIT, UROB, ULEU, UEPI, UWBC, URBC, UBAC, CAST, CRYS, UCOM, BILUA,  in the last 72 hours  MICROBIOLOGY: Recent Results (from the past 240 hour(s))  URINE CULTURE     Status: None   Collection Time    04/09/14 12:09 PM      Result Value Ref Range Status   Specimen Description URINE, CLEAN CATCH   Final   Special Requests NONE   Final   Culture  Setup Time     Final   Value: 04/09/2014 19:48      Performed at [facility]     Final   Value: NO GROWTH     Performed at [facility]   Culture     Final   Value: NO GROWTH     Performed at [facility]   Report Status 04/10/2014 FINAL   Final  CULTURE, BLOOD (ROUTINE X 2)     Status: None   Collection Time    04/09/14  3:01 PM      Result Value Ref Range Status   Specimen Description BLOOD FOREARM   Final   Special Requests BOTTLES DRAWN AEROBIC AND ANAEROBIC 6ML   Final   Culture  Setup Time     Final   Value: 04/09/2014 19:05     Performed at [facility]   Culture     Final   Value: NO GROWTH 5 DAYS     Performed at [facility]   Report Status 04/15/2014 FINAL   Final  CULTURE, BLOOD (ROUTINE X 2)     Status: None   Collection Time    04/09/14  3:01 PM      Result Value Ref Range Status   Specimen Description BLOOD BLOOD LEFT FOREARM   Final   Special Requests     Final   Value: BOTTLES DRAWN AEROBIC AND ANAEROBIC 6ML POST ANTIBIOTICS   Culture  Setup Time     Final   Value: 04/09/2014 19:05     Performed at [facility]   Culture     Final   Value: NO GROWTH 5 DAYS     Performed at [facility]   Report Status 04/15/2014 FINAL   Final    RADIOLOGY STUDIES/RESULTS: Dg Chest 2 View  04/09/2014   CLINICAL DATA:  Fever.  EXAM: CHEST  2 VIEW  COMPARISON:  Chest x-ray 01/02/2004.  FINDINGS: Mediastinum and hilar structures are normal. Stable borderline cardiomegaly. Prior CABG. Pacer leads are noted over the chest. Previously identified pacemaker has been removed. Right base atelectasis and/or infiltrate. No significant pleural effusion. No pneumothorax. No acute bony abnormality.  IMPRESSION: 1. Right base atelectasis and/or infiltrate. Follow-up chest x-ray to demonstrate clearing suggested. 2. Prior CABG.  Borderline cardiomegaly, no CHF.  Electronically Signed   By: Marcello Moores  Register   On: 04/09/2014 12:52   Ct Head Wo Contrast  04/14/2014    CLINICAL DATA:  Mental status change and confusion  EXAM: CT HEAD WITHOUT CONTRAST  TECHNIQUE: Contiguous axial images were obtained from the base of the skull through the vertex without intravenous contrast.  COMPARISON:  There are no previous studies for comparison.  FINDINGS: The study is limited due to motion artifact. The ventricles are normal in size and position. There is a focus of encephalomalacia in the posterior right parietal lobe. There is no acute intracranial hemorrhage nor objective evidence of an evolving ischemic event. The cerebellum and brainstem are grossly normal.  At bone window settings as best as can be determined there is no acute skull fracture. The observed paranasal sinuses and mastoid air cells are clear.  IMPRESSION: The study is limited due to motion artifact. There is no definite acute intracranial abnormality. Hypodensity in the right posterior parietal lobe is consistent with encephalomalacia.   Electronically Signed   By: David  Martinique   On: 04/14/2014 15:18   US Renal  04/11/2014   CLINICAL DATA:  Acute kidney injury and urinary tract infection  EXAM: RENAL/URINARY TRACT ULTRASOUND COMPLETE  COMPARISON:  None.  FINDINGS: Right Kidney:  Length: 10 cm. Echogenic cortex with abnormal cortical medullary differentiation. There is a 9 mm simple appearing cyst in the interpolar region. No hydronephrosis or evidence of solid mass.  Left Kidney:  Length: 10 cm. Echogenic cortex with abnormal cortical medullary differentiation. No hydronephrosis or evidence of mass.  Bladder:  Completely decompressed around a Foley catheter.  IMPRESSION: 1. No hydronephrosis. 2. Chronic medical renal disease.   Electronically Signed   By: Jorje Guild M.D.   On: 04/11/2014 06:17   Dg Chest Port 1 View  04/11/2014   CLINICAL DATA:  Possible pneumonia.  EXAM: PORTABLE CHEST - 1 VIEW  COMPARISON:  Chest x-ray 04/09/2014.  FINDINGS: Status post median sternotomy. Epicardial wires are seen projecting  over the right atrium. Abandoned wires projecting over the upper right hemithorax presumably from prior pacemaker. Lung volumes are low. There are some ill-defined bibasilar opacities favored to reflect some mild subsegmental atelectasis. No consolidative airspace disease. No pleural effusions. No pneumothorax. No evidence of pulmonary edema. Heart size is borderline enlarged. Upper mediastinal contours are unremarkable. Atherosclerosis in the thoracic aorta.  IMPRESSION: 1. Low lung volumes with probable bibasilar subsegmental atelectasis. 2. Atherosclerosis. 3. Postoperative changes and support apparatus, as above.   Electronically Signed   By: Vinnie Langton M.D.   On: 04/11/2014 11:24    Oren Binet, MD  [facility] Pager:[phone]  If 7PM-7AM, please contact night-coverage [email or web address] Password [email or web address] 04/19/2014, 8:14 AM   LOS: 10 days   **Disclaimer: This note may have been dictated with voice recognition software. Similar sounding words can inadvertently be transcribed and this note may contain transcription errors which may not have been corrected upon publication of note.**

## 2014-04-19 NOTE — Progress Notes (Signed)
Patient woke up and begin yelling out. RN and tech ran into room to find patient trying to climb over bed rail. Patient very agitated. RN giving PRN ativan now. Will continue to monitor patient.

## 2014-04-20 NOTE — Progress Notes (Signed)
Patient's IV occluded. IV team has been paged.

## 2014-04-20 NOTE — Progress Notes (Signed)
CARE MANAGEMENT NOTE 04/20/2014  Patient:  Jeffrey Herring, Jeffrey Herring   Account Number:  [id number]  Date Initiated:  04/10/2014  Documentation initiated by:  Tomi Bamberger  Subjective/Objective Assessment:   dx lithium toxicity, confusion, arf, ?pna  admit- lives with wife     Action/Plan:   Anticipated DC Date:  04/13/2014   Anticipated DC Plan:  SKILLED NURSING FACILITY  In-house referral  Clinical Social Worker      DC Planning Services  CM consult      Choice offered to / List presented to:             Status of service:  Completed, signed off Medicare Important Message given?  YES (If response is "NO", the following Medicare IM given date fields will be blank) Date Medicare IM given:  04/20/2014 Medicare IM given by:  [facility] Date Additional Medicare IM given:   Additional Medicare IM given by:    Discharge Disposition:    Per UR Regulation:    If discussed at Long Length of Stay Meetings, dates discussed:    Comments:  04/20/2014 1500 LTAC referral sent. Pt not appropriate for LTAC. Jonnie Finner RN CCM Case Mgmt phone [phone]

## 2014-04-20 NOTE — Progress Notes (Signed)
PATIENT DETAILS Name: Jeffrey Herring Age: 63 y.o. Sex: male Date of Birth: [birth year] Admit Date: 04/09/2014 Admitting Physician Donne Hazel, MD [id number], MD  Subjective: Essentially unchanged  Assessment/Plan: Principal Problem: Acute encephalopathy - Etiology multifacorial, current suspicion for metabolic encephalopathy- given lithium toxicity, worsening renal failure, UTI/community acquired pneumonia. Per Neurology mental status expected to slowly improve - CT head on 6/30 negative. Unable to do MRI as the patient has a cardiac pacemaker.EEG negative for epileptiform waves.  -Neurology/Psych following, since slowly improving, but not yet ready for discharge.  Active Problems: Acute on chronic kidney disease stage III - Multi-factorial etiology for acute renal failure-including eating toxicity, hypercalcemia, and obstructive uropathy. Nephrology consulted, supportive care given, creatinine significantly improved and now close to her usual baseline.Recheck Lytes periodically  Acute urinary retention - Resolved. Temporarily required placement of Foley catheter. Renal ultrasound did not show any hydronephrosis. - Currently voiding spontaneously, has a condom catheter in place. Continue Flomax  Hypernatremia -? Developing DI secondary to Lithium toxicity - Monitor periodically. Cautious to continue with fluids. Per RN patient now starting to drink, will continue to encourage oral intake  Metabolic acidosis - Secondary to worsening kidney function -better. C/w oral bicarbonate.  Suspected community acquired pneumonia - Remains afebrile, no leukocytosis. Does not have a toxic appearance. - Has finished a course of IV antibiotics including Rocephin/Zithromax, with transition to oral Levaquin on 6/29 for 2 doses- last dose on 7/1.  Long-standing history of bipolar disorder - Presented with Lithium toxicity, has been evaluated by psychiatry-Lithium discontinued  permanently-started on Zyprexa  History of chronic systolic heart failure - Clinically compensated. Continue to monitor carefully while on IV fluids. - ACE inhibitor discontinued given worsening kidney function, continue cautiously with beta blocker blood pressure permitting.  History of complete heart block - Pacemaker in place.  History of venous thromboembolism - Previously was on Coumadin- currently no evidence of any recurrence. On heparin for DVT prophylaxis.  Severe Malnutrition -c/w Ensure Complete po BID  Disposition: Remain inpatient  DVT Prophylaxis: Prophylactic Heparin  Code Status: Full code   Family Communication Wife-Marriane-over the phone 7/4  Procedures:  None  CONSULTS:  nephrology, neurology and psychiatry   MEDICATIONS: Scheduled Meds: . ALPRAZolam  1 mg Oral TID  . clopidogrel  75 mg Oral q morning - 10a  . darbepoetin (ARANESP) injection - NON-DIALYSIS  100 mcg Subcutaneous Q Sat-1800  . feeding supplement (ENSURE COMPLETE)  237 mL Oral BID BM  . heparin  5,000 Units Subcutaneous 3 times per day  . metoprolol succinate  25 mg Oral q morning - 10a  . OLANZapine zydis  10 mg Oral QHS  . pneumococcal 23 valent vaccine  0.5 mL Intramuscular Tomorrow-1000  . polyethylene glycol  17 g Oral Daily  . senna  2 tablet Oral QHS  . simvastatin  10 mg Oral QHS  . sodium bicarbonate  1,300 mg Oral BID  . sodium chloride  3 mL Intravenous Q12H  . tamsulosin  0.4 mg Oral q morning - 10a  . traZODone  100 mg Oral QHS   Continuous Infusions: . sodium chloride 50 mL/hr at 04/20/14 0521   PRN Meds:.acetaminophen, acetaminophen, haloperidol lactate, LORazepam, ondansetron (ZOFRAN) IV, ondansetron  Antibiotics: Anti-infectives   Start     Dose/Rate Route Frequency Ordered Stop   04/13/14 1200  levofloxacin (LEVAQUIN) tablet 500 mg     500 mg Oral Every 48 hours 04/13/14 1143 04/15/14 1200  04/10/14 1400  cefTRIAXone (ROCEPHIN) 1 g in dextrose 5 % 50  mL IVPB  Status:  Discontinued     1 g 100 mL/hr over 30 Minutes Intravenous Every 24 hours 04/09/14 1453 04/13/14 1143   04/10/14 1000  azithromycin (ZITHROMAX) tablet 500 mg  Status:  Discontinued     500 mg Oral Every 24 hours 04/09/14 1453 04/13/14 1143   04/09/14 1500  cefTRIAXone (ROCEPHIN) 1 g in dextrose 5 % 50 mL IVPB  Status:  Discontinued     1 g 100 mL/hr over 30 Minutes Intravenous Every 24 hours 04/09/14 1448 04/09/14 1453   04/09/14 1500  azithromycin (ZITHROMAX) tablet 500 mg  Status:  Discontinued     500 mg Oral Every 24 hours 04/09/14 1448 04/09/14 1453   04/09/14 1330  cefTRIAXone (ROCEPHIN) 1 g in dextrose 5 % 50 mL IVPB     1 g 100 mL/hr over 30 Minutes Intravenous  Once 04/09/14 1316 04/09/14 1451   04/09/14 1330  azithromycin (ZITHROMAX) 500 mg in dextrose 5 % 250 mL IVPB     500 mg 250 mL/hr over 60 Minutes Intravenous  Once 04/09/14 1316 04/09/14 1600       PHYSICAL EXAM: Vital signs in last 24 hours: Filed Vitals:   04/19/14 1500 04/19/14 2201 04/20/14 0515 04/20/14 1026  BP: 109/59 117/87 146/77 143/76  Pulse: 76 64 69 61  Temp: 97.8 F (36.6 C) 97.6 F (36.4 C) 98.3 F (36.8 C)   TempSrc: Oral Oral Oral   Resp: 14 18 18    Height:      Weight:      SpO2: 93% 96% 96%     Weight change:  Filed Weights   04/09/14 1820  Weight: 68.221 kg (150 lb 6.4 oz)   Body mass index is 21.58 kg/(m^2).   Gen Exam: Awake, pleasantly confused, continues to answers some questions appropriately today Neck: Supple, No JVD.   Chest: B/L Clear.   CVS: S1 S2 Regular, no murmurs.  Abdomen: soft, BS +, non tender, non distended.  Extremities: no edema, lower extremities warm to touch. Neurologic: Non Focal.   Skin: No Rash.   Wounds: N/A.   Intake/Output from previous day:  Intake/Output Summary (Last 24 hours) at 04/20/14 1311 Last data filed at 04/20/14 1043  Gross per 24 hour  Intake    720 ml  Output   2000 ml  Net  -1280 ml     LAB  RESULTS: CBC  Recent Labs Lab 04/17/14 0400  WBC 6.9  HGB 9.5*  HCT 30.7*  PLT 132*  MCV 99.0  MCH 30.6  MCHC 30.9  RDW 15.4    Chemistries   Recent Labs Lab 04/15/14 1653 04/16/14 0435 04/17/14 0400 04/18/14 0417 04/19/14 0502  NA 149* 150* 145 147 144  K 4.1 3.9 4.0 4.1 3.9  CL 113* 112 110 110 109  CO2 21 23 23 23 22   GLUCOSE 106* 97 93 90 106*  BUN 28* 26* 20 17 14   CREATININE 2.28* 2.23* 2.07* 2.04* 2.00*  CALCIUM 10.2 9.9 9.3 9.5 9.0    CBG: No results found for this basename: GLUCAP,  in the last 168 hours  GFR Estimated Creatinine Clearance: 36 ml/min (by C-G formula based on Cr of 2).  Coagulation profile No results found for this basename: INR, PROTIME,  in the last 168 hours  Cardiac Enzymes No results found for this basename: CK, CKMB, TROPONINI, MYOGLOBIN,  in the last 168 hours  No  components found with this basename: POCBNP,  No results found for this basename: DDIMER,  in the last 72 hours No results found for this basename: HGBA1C,  in the last 72 hours No results found for this basename: CHOL, HDL, LDLCALC, TRIG, CHOLHDL, LDLDIRECT,  in the last 72 hours No results found for this basename: TSH, T4TOTAL, FREET3, T3FREE, THYROIDAB,  in the last 72 hours No results found for this basename: VITAMINB12, FOLATE, FERRITIN, TIBC, IRON, RETICCTPCT,  in the last 72 hours No results found for this basename: LIPASE, AMYLASE,  in the last 72 hours  Urine Studies No results found for this basename: UACOL, UAPR, USPG, UPH, UTP, UGL, UKET, UBIL, UHGB, UNIT, UROB, ULEU, UEPI, UWBC, URBC, UBAC, CAST, CRYS, UCOM, BILUA,  in the last 72 hours  MICROBIOLOGY: No results found for this or any previous visit (from the past 240 hour(s)).  RADIOLOGY STUDIES/RESULTS: Dg Chest 2 View  04/09/2014   CLINICAL DATA:  Fever.  EXAM: CHEST  2 VIEW  COMPARISON:  Chest x-ray 01/02/2004.  FINDINGS: Mediastinum and hilar structures are normal. Stable borderline cardiomegaly.  Prior CABG. Pacer leads are noted over the chest. Previously identified pacemaker has been removed. Right base atelectasis and/or infiltrate. No significant pleural effusion. No pneumothorax. No acute bony abnormality.  IMPRESSION: 1. Right base atelectasis and/or infiltrate. Follow-up chest x-ray to demonstrate clearing suggested. 2. Prior CABG.  Borderline cardiomegaly, no CHF.   Electronically Signed   By: Marcello Moores  Register   On: 04/09/2014 12:52   Ct Head Wo Contrast  04/14/2014   CLINICAL DATA:  Mental status change and confusion  EXAM: CT HEAD WITHOUT CONTRAST  TECHNIQUE: Contiguous axial images were obtained from the base of the skull through the vertex without intravenous contrast.  COMPARISON:  There are no previous studies for comparison.  FINDINGS: The study is limited due to motion artifact. The ventricles are normal in size and position. There is a focus of encephalomalacia in the posterior right parietal lobe. There is no acute intracranial hemorrhage nor objective evidence of an evolving ischemic event. The cerebellum and brainstem are grossly normal.  At bone window settings as best as can be determined there is no acute skull fracture. The observed paranasal sinuses and mastoid air cells are clear.  IMPRESSION: The study is limited due to motion artifact. There is no definite acute intracranial abnormality. Hypodensity in the right posterior parietal lobe is consistent with encephalomalacia.   Electronically Signed   By: David  Martinique   On: 04/14/2014 15:18   US Renal  04/11/2014   CLINICAL DATA:  Acute kidney injury and urinary tract infection  EXAM: RENAL/URINARY TRACT ULTRASOUND COMPLETE  COMPARISON:  None.  FINDINGS: Right Kidney:  Length: 10 cm. Echogenic cortex with abnormal cortical medullary differentiation. There is a 9 mm simple appearing cyst in the interpolar region. No hydronephrosis or evidence of solid mass.  Left Kidney:  Length: 10 cm. Echogenic cortex with abnormal cortical  medullary differentiation. No hydronephrosis or evidence of mass.  Bladder:  Completely decompressed around a Foley catheter.  IMPRESSION: 1. No hydronephrosis. 2. Chronic medical renal disease.   Electronically Signed   By: Jorje Guild M.D.   On: 04/11/2014 06:17   Dg Chest Port 1 View  04/11/2014   CLINICAL DATA:  Possible pneumonia.  EXAM: PORTABLE CHEST - 1 VIEW  COMPARISON:  Chest x-ray 04/09/2014.  FINDINGS: Status post median sternotomy. Epicardial wires are seen projecting over the right atrium. Abandoned wires projecting over the upper  right hemithorax presumably from prior pacemaker. Lung volumes are low. There are some ill-defined bibasilar opacities favored to reflect some mild subsegmental atelectasis. No consolidative airspace disease. No pleural effusions. No pneumothorax. No evidence of pulmonary edema. Heart size is borderline enlarged. Upper mediastinal contours are unremarkable. Atherosclerosis in the thoracic aorta.  IMPRESSION: 1. Low lung volumes with probable bibasilar subsegmental atelectasis. 2. Atherosclerosis. 3. Postoperative changes and support apparatus, as above.   Electronically Signed   By: Vinnie Langton M.D.   On: 04/11/2014 11:24    Oren Binet, MD  [facility] Pager:[phone]  If 7PM-7AM, please contact night-coverage [email or web address] Password [email or web address] 04/20/2014, 1:11 PM   LOS: 11 days   **Disclaimer: This note may have been dictated with voice recognition software. Similar sounding words can inadvertently be transcribed and this note may contain transcription errors which may not have been corrected upon publication of note.**

## 2014-04-20 NOTE — Progress Notes (Signed)
Subjective: Patient is very drowsy this AM.  He will awaken to tactile stimuli easily but quickly drifts off to sleep.  Per nurse, he was answering questions one hour prior. patein will smile and stick his tongue out when asked. Per psych note yesterday, "Reportedly patient has slowly improving Clinically and status post lithium toxicity. Patient has better cognitive improvement, he is more verbally responds and continue to be confused. He can not hold a conversation at this time but able answer few direct questions and has inappropriate affect and behaviors like showing his private part etc."    Objective: Current vital signs: BP 146/77  Pulse 69  Temp(Src) 98.3 F (36.8 C) (Oral)  Resp 18  Ht 5\' 10"  (1.778 m)  Wt 68.221 kg (150 lb 6.4 oz)  BMI 21.58 kg/m2  SpO2 96% Vital signs in last 24 hours: Temp:  [97.6 F (36.4 C)-98.3 F (36.8 C)] 98.3 F (36.8 C) (07/06 0515) Pulse Rate:  [64-76] 69 (07/06 0515) Resp:  [14-18] 18 (07/06 0515) BP: (109-146)/(59-87) 146/77 mmHg (07/06 0515) SpO2:  [93 %-96 %] 96 % (07/06 0515)  Intake/Output from previous day: 07/05 0701 - 07/06 0700 In: 1200 [P.O.:600; I.V.:600] Out: 1500 [Urine:1500] Intake/Output this shift: Total I/O In: -  Out: 750 [Urine:750] Nutritional status: Cardiac  Neurologic Exam: Mental Status: Very drowsy --Awakens to tactile stimuli but quickly falls asleep.  Speech dysarthric and nonsensical when speaking.  Does not follow verbal or visual commands. Cranial Nerves: II:  Visual fields blinks to threat bilaterally, pupils equal, round, reactive to light and accommodation III,IV, VI: ptosis not present, extra-ocular motions intact bilaterally V,VII: face symmetric, facial light touch sensation normal bilaterally VIII: hearing normal bilaterally IX,X: gag reflex present XI: bilateral shoulder shrug XII: midline tongue extension without atrophy or fasciculations  Motor: Moving all extremities but does not follow  commands.  Sensory: equal and symmetric throughout to light touch and noxious stimuli Deep Tendon Reflexes:  Brisk throughout without AJ   Lab Results: Basic Metabolic Panel:  Recent Labs Lab 04/15/14 1653 04/16/14 0435 04/17/14 0400 04/18/14 0417 04/19/14 0502  NA 149* 150* 145 147 144  K 4.1 3.9 4.0 4.1 3.9  CL 113* 112 110 110 109  CO2 21 23 23 23 22   GLUCOSE 106* 97 93 90 106*  BUN 28* 26* 20 17 14   CREATININE 2.28* 2.23* 2.07* 2.04* 2.00*  CALCIUM 10.2 9.9 9.3 9.5 9.0  PHOS 2.8 2.9 2.8 3.6 3.1    Liver Function Tests:  Recent Labs Lab 04/15/14 1653 04/16/14 0435 04/17/14 0400 04/18/14 0417 04/19/14 0502  ALBUMIN 3.7 3.6 3.1* 3.2* 3.0*   No results found for this basename: LIPASE, AMYLASE,  in the last 168 hours No results found for this basename: AMMONIA,  in the last 168 hours  CBC:  Recent Labs Lab 04/17/14 0400  WBC 6.9  HGB 9.5*  HCT 30.7*  MCV 99.0  PLT 132*    Cardiac Enzymes: No results found for this basename: CKTOTAL, CKMB, CKMBINDEX, TROPONINI,  in the last 168 hours  Lipid Panel: No results found for this basename: CHOL, TRIG, HDL, CHOLHDL, VLDL, LDLCALC,  in the last 168 hours  CBG: No results found for this basename: GLUCAP,  in the last 168 hours  Microbiology: Results for orders placed during the hospital encounter of 04/09/14  URINE CULTURE     Status: None   Collection Time    04/09/14 12:09 PM      Result Value Ref Range Status  Specimen Description URINE, CLEAN CATCH   Final   Special Requests NONE   Final   Culture  Setup Time     Final   Value: 04/09/2014 19:48     Performed at [facility]     Final   Value: NO GROWTH     Performed at [facility]   Culture     Final   Value: NO GROWTH     Performed at [facility]   Report Status 04/10/2014 FINAL   Final  CULTURE, BLOOD (ROUTINE X 2)     Status: None   Collection Time    04/09/14  3:01 PM      Result Value Ref Range  Status   Specimen Description BLOOD FOREARM   Final   Special Requests BOTTLES DRAWN AEROBIC AND ANAEROBIC 6ML   Final   Culture  Setup Time     Final   Value: 04/09/2014 19:05     Performed at [facility]   Culture     Final   Value: NO GROWTH 5 DAYS     Performed at [facility]   Report Status 04/15/2014 FINAL   Final  CULTURE, BLOOD (ROUTINE X 2)     Status: None   Collection Time    04/09/14  3:01 PM      Result Value Ref Range Status   Specimen Description BLOOD BLOOD LEFT FOREARM   Final   Special Requests     Final   Value: BOTTLES DRAWN AEROBIC AND ANAEROBIC 6ML POST ANTIBIOTICS   Culture  Setup Time     Final   Value: 04/09/2014 19:05     Performed at [facility]   Culture     Final   Value: NO GROWTH 5 DAYS     Performed at [facility]   Report Status 04/15/2014 FINAL   Final    Coagulation Studies: No results found for this basename: LABPROT, INR,  in the last 72 hours  Imaging: No results found.  Medications:  Scheduled: . ALPRAZolam  1 mg Oral TID  . clopidogrel  75 mg Oral q morning - 10a  . darbepoetin (ARANESP) injection - NON-DIALYSIS  100 mcg Subcutaneous Q Sat-1800  . feeding supplement (ENSURE COMPLETE)  237 mL Oral BID BM  . heparin  5,000 Units Subcutaneous 3 times per day  . metoprolol succinate  25 mg Oral q morning - 10a  . OLANZapine zydis  10 mg Oral QHS  . pneumococcal 23 valent vaccine  0.5 mL Intramuscular Tomorrow-1000  . polyethylene glycol  17 g Oral Daily  . senna  2 tablet Oral QHS  . simvastatin  10 mg Oral QHS  . sodium bicarbonate  1,300 mg Oral BID  . sodium chloride  3 mL Intravenous Q12H  . tamsulosin  0.4 mg Oral q morning - 10a  . traZODone  100 mg Oral QHS    Assessment/Plan: patient continues to slowly improve but till becoming agitated.  Haldol and Ativan PRN for agitation but not requiring restraints.  Renal function continues to slowly improve (creatinine 2.00) and lithium  (last  level 04/16/2104 was 0.56) level improve. Psych involved.   Recommendations:  1. Will continue to follow with you   Etta Quill PA-C [facility] [phone]  04/20/2014, 10:22 AM

## 2014-04-21 LAB — BASIC METABOLIC PANEL
Anion gap: 15 (ref 5–15)
BUN: 13 mg/dL (ref 6–23)
CO2: 21 mEq/L (ref 19–32)
Calcium: 9.4 mg/dL (ref 8.4–10.5)
Chloride: 109 mEq/L (ref 96–112)
Creatinine, Ser: 1.98 mg/dL — ABNORMAL HIGH (ref 0.50–1.35)
GFR calc Af Amer: 39 mL/min — ABNORMAL LOW (ref >90)
GFR calc non Af Amer: 34 mL/min — ABNORMAL LOW (ref >90)
Glucose, Bld: 117 mg/dL — ABNORMAL HIGH (ref 70–99)
Potassium: 4.2 mEq/L (ref 3.7–5.3)
Sodium: 145 mEq/L (ref 137–147)

## 2014-04-21 MED ORDER — FLEET ENEMA 7-19 GM/118ML RE ENEM
1.0000 | ENEMA | Freq: Every day | RECTAL | Status: DC | PRN
  Filled 2014-04-21: qty 1

## 2014-04-21 MED ORDER — BISACODYL 10 MG RE SUPP: 10.0000 mg | Freq: Every day | RECTAL | Status: DC | PRN

## 2014-04-21 NOTE — Progress Notes (Signed)
Physical Therapy Treatment Patient Details Name: Jeffrey Herring MRN: [id number] DOB: September 16, 1951 Today's Date: 04/21/2014    History of Present Illness Patient is a 63 y/o male with PMH of bipolar disorder, pacemaker placement, diabetes insipidus, DVT, PE, CKD presents with increased confusion and agitation secondary to lithium toxicity per MD. CXR findings suggestive of R base infiltrate, WBC over 12k; CT head (-); psych following and psychiatric admission recommended when stable.    PT Comments    Patient continues to have difficulty attending to tasks and consistently following commands to participate in therapy. Requires constant cues -verbal and tactile- to maintain focus, if at all. Easily distracted and fidgeting with all nearby covers, lines etc. Takes increased time to process and perform any task. Pt not willing to participate in therapeutic exercise today even with maximal effort from therapist. Tolerated short distance ambulation with much direction and coaxing. Concerned about patient's ability to retain learned information. Above deficits put pt at increased risk for falls. Will continue to follow to assess progress/improvement.   Follow Up Recommendations  SNF;Supervision/Assistance - 24 hour     Equipment Recommendations  None recommended by PT    Recommendations for Other Services       Precautions / Restrictions Precautions Precautions: Fall Restrictions Weight Bearing Restrictions: No    Mobility  Bed Mobility Overal bed mobility: Needs Assistance Bed Mobility: Supine to Sit     Supine to sit: Min assist     General bed mobility comments: Pt reaching out to grab onto therapist's hand to sit EOB. Increased time (2 minutes) to convince pt to sit up, distracted by covers, chuck pad, lines etc.   Transfers Overall transfer level: Needs assistance Equipment used: Rolling walker (2 wheeled) Transfers: Sit to/from Stand Sit to Stand: Min assist          General transfer comment: VC for technique and to use RW as pt continually pushing RW away prior to standing, "get rid of this stupid thing."   Ambulation/Gait Ambulation/Gait assistance: Min assist Ambulation Distance (Feet): 15 Feet Assistive device: Rolling walker (2 wheeled) Gait Pattern/deviations: Step-through pattern;Trunk flexed;Decreased stride length   Gait velocity interpretation: Below normal speed for age/gender General Gait Details: Difficulty focusing on ambulation; therapist manually pushing RW to guide patient in correct direction. Pt with increased knee flexion and forward hip flexion bending forward to look out window. Constant manual cues to guide patient towards chair, leaning too far anteriorly with almost LOB. Not following commands consistently.   Stairs            Wheelchair Mobility    Modified Rankin (Stroke Patients Only)       Balance Overall balance assessment: Needs assistance Sitting-balance support: No upper extremity supported;Feet supported Sitting balance-Leahy Scale: Poor Sitting balance - Comments: Anterior lean sitting EOB with concerns for LOB secondary to impulsiveness.   Standing balance support: Bilateral upper extremity supported;During functional activity Standing balance-Leahy Scale: Poor Standing balance comment: Use of RW for support. Unpredictable.                    Cognition Arousal/Alertness: Awake/alert Behavior During Therapy: Impulsive;Restless Overall Cognitive Status: Impaired/Different from baseline Area of Impairment: Attention;Following commands;Safety/judgement;Awareness     Memory: Decreased short-term memory Following Commands: Follows one step commands inconsistently Safety/Judgement: Decreased awareness of safety;Decreased awareness of deficits     General Comments: Pt requires constant cues to stay on task however still very distracted. Trying to stand numerous times when told to remain  seated.  Required maximal coaxing and encouragment to participate. Difficult to maintain attention to perform exercise/tasks.    Exercises General Exercises - Lower Extremity Hip Flexion/Marching: Both (x3 reps until pt loses focus/attention.) Toe Raises: Both;5 reps (with increased encouragment and cues to participate in exercise.)    General Comments        Pertinent Vitals/Pain 0/10 pain reported. Vitals stable throughout.    Home Living                      Prior Function            PT Goals (current goals can now be found in the care plan section) Progress towards PT goals: Progressing toward goals    Frequency  Min 2X/week    PT Plan Current plan remains appropriate    Co-evaluation             End of Session Equipment Utilized During Treatment: Gait belt Activity Tolerance: Other (comment) (Treatment limited secondary to lack of attention/focus and ability to follow commands to participate in safe exercise/activity.) Patient left: in chair;with call bell/phone within reach;with chair alarm set;with nursing/sitter in room     Time: 1340-1403 PT Time Calculation (min): 23 min  Charges:  $Therapeutic Activity: 23-37 mins                    G CodesCandy Sledge A 05-13-2014, 2:11 PM Candy Sledge, [place], DPT [phone]

## 2014-04-21 NOTE — Progress Notes (Signed)
PATIENT DETAILS Name: Jeffrey Herring Age: 63 y.o. Sex: male Date of Birth: [birth year] Admit Date: 04/09/2014 Admitting Physician Donne Hazel, MD IN:[id number], MD  Brief Summary Patient is 63 yo male with a hx of Bipolar disorder, who presented with Acute Encephalopathy. Etiology of encephalopathy was felt to be secondary to Lithium toxicity, ARF . He also was thought to have PNA, and has completed a course of antibiotics.Lithium has been held and now discontinued permanently. His hospital course has been marked by very slow improvement. Renal function has improved and is close to baseline. Mental status has improved somewhat, but patient continues to improve very slowly. Unfortunately, given slow improvement in mental status, patient is still not ready for discharge. CT head and EEG negative. Neurology and Psychiatry continue to follow.  Subjective: Essentially unchanged  Assessment/Plan: Principal Problem: Acute encephalopathy - Etiology multifacorial, current suspicion for metabolic encephalopathy- given lithium toxicity, worsening renal failure, UTI/community acquired pneumonia. Per Neurology mental status expected to slowly improve - CT head on 6/30 negative. Unable to do MRI as the patient has a cardiac pacemaker.EEG negative for epileptiform waves.  -Neurology/Psych following, since slowly improving, but not yet ready for discharge.Plan to discharge to Geri-psych unit ultimately  Active Problems: Acute on chronic kidney disease stage III - Multi-factorial etiology for acute renal failure-including eating toxicity, hypercalcemia, and obstructive uropathy. Nephrology consulted, supportive care given, creatinine significantly improved and now close to her usual baseline.Recheck Lytes periodically-await repeat BMET this am.  Acute urinary retention - Resolved. Temporarily required placement of Foley catheter. Renal ultrasound did not show any hydronephrosis. -  Currently voiding spontaneously, has a condom catheter in place. Continue Flomax  Hypernatremia -? Developing DI secondary to Lithium toxicity - Monitor periodically-await repeat BMET this am. Cautious to continue with fluids. Per RN patient now starting to drink, will continue to encourage oral intake  Metabolic acidosis - Secondary to worsening kidney function -better. C/w oral bicarbonate.  Suspected community acquired pneumonia - Remains afebrile, no leukocytosis. Does not have a toxic appearance. - Has finished a course of IV antibiotics including Rocephin/Zithromax, with transition to oral Levaquin on 6/29 for 2 doses- last dose on 7/1.  Long-standing history of bipolar disorder - Presented with Lithium toxicity, has been evaluated by psychiatry-Lithium discontinued permanently-started on Zyprexa  History of chronic systolic heart failure - Clinically compensated. Continue to monitor carefully while on IV fluids. - ACE inhibitor discontinued given worsening kidney function, continue cautiously with beta blocker blood pressure permitting.  History of complete heart block - Pacemaker in place.  History of venous thromboembolism - Previously was on Coumadin- currently no evidence of any recurrence. On heparin for DVT prophylaxis.  Severe Malnutrition -c/w Ensure Complete po BID  Disposition: Remain inpatient  DVT Prophylaxis: Prophylactic Heparin  Code Status: Full code   Family Communication Wife-Marriane-over the phone 7/4  Procedures:  None  CONSULTS:  nephrology, neurology and psychiatry   MEDICATIONS: Scheduled Meds: . ALPRAZolam  1 mg Oral TID  . clopidogrel  75 mg Oral q morning - 10a  . darbepoetin (ARANESP) injection - NON-DIALYSIS  100 mcg Subcutaneous Q Sat-1800  . feeding supplement (ENSURE COMPLETE)  237 mL Oral BID BM  . heparin  5,000 Units Subcutaneous 3 times per day  . metoprolol succinate  25 mg Oral q morning - 10a  . OLANZapine zydis   10 mg Oral QHS  . pneumococcal 23 valent vaccine  0.5 mL Intramuscular Tomorrow-1000  . polyethylene  glycol  17 g Oral Daily  . senna  2 tablet Oral QHS  . simvastatin  10 mg Oral QHS  . sodium bicarbonate  1,300 mg Oral BID  . sodium chloride  3 mL Intravenous Q12H  . tamsulosin  0.4 mg Oral q morning - 10a  . traZODone  100 mg Oral QHS   Continuous Infusions: . sodium chloride 100 mL/hr at 04/21/14 0605   PRN Meds:.acetaminophen, acetaminophen, haloperidol lactate, LORazepam, ondansetron (ZOFRAN) IV, ondansetron  Antibiotics: Anti-infectives   Start     Dose/Rate Route Frequency Ordered Stop   04/13/14 1200  levofloxacin (LEVAQUIN) tablet 500 mg     500 mg Oral Every 48 hours 04/13/14 1143 04/15/14 1200   04/10/14 1400  cefTRIAXone (ROCEPHIN) 1 g in dextrose 5 % 50 mL IVPB  Status:  Discontinued     1 g 100 mL/hr over 30 Minutes Intravenous Every 24 hours 04/09/14 1453 04/13/14 1143   04/10/14 1000  azithromycin (ZITHROMAX) tablet 500 mg  Status:  Discontinued     500 mg Oral Every 24 hours 04/09/14 1453 04/13/14 1143   04/09/14 1500  cefTRIAXone (ROCEPHIN) 1 g in dextrose 5 % 50 mL IVPB  Status:  Discontinued     1 g 100 mL/hr over 30 Minutes Intravenous Every 24 hours 04/09/14 1448 04/09/14 1453   04/09/14 1500  azithromycin (ZITHROMAX) tablet 500 mg  Status:  Discontinued     500 mg Oral Every 24 hours 04/09/14 1448 04/09/14 1453   04/09/14 1330  cefTRIAXone (ROCEPHIN) 1 g in dextrose 5 % 50 mL IVPB     1 g 100 mL/hr over 30 Minutes Intravenous  Once 04/09/14 1316 04/09/14 1451   04/09/14 1330  azithromycin (ZITHROMAX) 500 mg in dextrose 5 % 250 mL IVPB     500 mg 250 mL/hr over 60 Minutes Intravenous  Once 04/09/14 1316 04/09/14 1600       PHYSICAL EXAM: Vital signs in last 24 hours: Filed Vitals:   04/20/14 1026 04/20/14 1427 04/20/14 2105 04/21/14 0556  BP: 143/76 147/72 123/74 136/61  Pulse: 61 72 84 64  Temp:  98.8 F (37.1 C) 98.4 F (36.9 C) 98.2 F (36.8  C)  TempSrc:  Oral Oral Oral  Resp:  18 16 16   Height:      Weight:      SpO2:  93% 98% 97%    Weight change:  Filed Weights   04/09/14 1820  Weight: 68.221 kg (150 lb 6.4 oz)   Body mass index is 21.58 kg/(m^2).   Gen Exam: Awake, pleasantly confused, continues to answers some questions appropriately today Neck: Supple, No JVD.   Chest: B/L Clear.   CVS: S1 S2 Regular, no murmurs.  Abdomen: soft, BS +, non tender, non distended.  Extremities: no edema, lower extremities warm to touch. Neurologic: Non Focal.   Skin: No Rash.   Wounds: N/A.   Intake/Output from previous day:  Intake/Output Summary (Last 24 hours) at 04/21/14 0955 Last data filed at 04/21/14 0905  Gross per 24 hour  Intake   2020 ml  Output   2350 ml  Net   -330 ml     LAB RESULTS: CBC  Recent Labs Lab 04/17/14 0400  WBC 6.9  HGB 9.5*  HCT 30.7*  PLT 132*  MCV 99.0  MCH 30.6  MCHC 30.9  RDW 15.4    Chemistries   Recent Labs Lab 04/15/14 1653 04/16/14 0435 04/17/14 0400 04/18/14 0417 04/19/14 0502  NA 149*  150* 145 147 144  K 4.1 3.9 4.0 4.1 3.9  CL 113* 112 110 110 109  CO2 21 23 23 23 22   GLUCOSE 106* 97 93 90 106*  BUN 28* 26* 20 17 14   CREATININE 2.28* 2.23* 2.07* 2.04* 2.00*  CALCIUM 10.2 9.9 9.3 9.5 9.0    CBG: No results found for this basename: GLUCAP,  in the last 168 hours  GFR Estimated Creatinine Clearance: 36 ml/min (by C-G formula based on Cr of 2).  Coagulation profile No results found for this basename: INR, PROTIME,  in the last 168 hours  Cardiac Enzymes No results found for this basename: CK, CKMB, TROPONINI, MYOGLOBIN,  in the last 168 hours  No components found with this basename: POCBNP,  No results found for this basename: DDIMER,  in the last 72 hours No results found for this basename: HGBA1C,  in the last 72 hours No results found for this basename: CHOL, HDL, LDLCALC, TRIG, CHOLHDL, LDLDIRECT,  in the last 72 hours No results found for  this basename: TSH, T4TOTAL, FREET3, T3FREE, THYROIDAB,  in the last 72 hours No results found for this basename: VITAMINB12, FOLATE, FERRITIN, TIBC, IRON, RETICCTPCT,  in the last 72 hours No results found for this basename: LIPASE, AMYLASE,  in the last 72 hours  Urine Studies No results found for this basename: UACOL, UAPR, USPG, UPH, UTP, UGL, UKET, UBIL, UHGB, UNIT, UROB, ULEU, UEPI, UWBC, URBC, UBAC, CAST, CRYS, UCOM, BILUA,  in the last 72 hours  MICROBIOLOGY: No results found for this or any previous visit (from the past 240 hour(s)).  RADIOLOGY STUDIES/RESULTS: Dg Chest 2 View  04/09/2014   CLINICAL DATA:  Fever.  EXAM: CHEST  2 VIEW  COMPARISON:  Chest x-ray 01/02/2004.  FINDINGS: Mediastinum and hilar structures are normal. Stable borderline cardiomegaly. Prior CABG. Pacer leads are noted over the chest. Previously identified pacemaker has been removed. Right base atelectasis and/or infiltrate. No significant pleural effusion. No pneumothorax. No acute bony abnormality.  IMPRESSION: 1. Right base atelectasis and/or infiltrate. Follow-up chest x-ray to demonstrate clearing suggested. 2. Prior CABG.  Borderline cardiomegaly, no CHF.   Electronically Signed   By: Marcello Moores  Register   On: 04/09/2014 12:52   Ct Head Wo Contrast  04/14/2014   CLINICAL DATA:  Mental status change and confusion  EXAM: CT HEAD WITHOUT CONTRAST  TECHNIQUE: Contiguous axial images were obtained from the base of the skull through the vertex without intravenous contrast.  COMPARISON:  There are no previous studies for comparison.  FINDINGS: The study is limited due to motion artifact. The ventricles are normal in size and position. There is a focus of encephalomalacia in the posterior right parietal lobe. There is no acute intracranial hemorrhage nor objective evidence of an evolving ischemic event. The cerebellum and brainstem are grossly normal.  At bone window settings as best as can be determined there is no acute  skull fracture. The observed paranasal sinuses and mastoid air cells are clear.  IMPRESSION: The study is limited due to motion artifact. There is no definite acute intracranial abnormality. Hypodensity in the right posterior parietal lobe is consistent with encephalomalacia.   Electronically Signed   By: David  Martinique   On: 04/14/2014 15:18   US Renal  04/11/2014   CLINICAL DATA:  Acute kidney injury and urinary tract infection  EXAM: RENAL/URINARY TRACT ULTRASOUND COMPLETE  COMPARISON:  None.  FINDINGS: Right Kidney:  Length: 10 cm. Echogenic cortex with abnormal cortical medullary differentiation. There  is a 9 mm simple appearing cyst in the interpolar region. No hydronephrosis or evidence of solid mass.  Left Kidney:  Length: 10 cm. Echogenic cortex with abnormal cortical medullary differentiation. No hydronephrosis or evidence of mass.  Bladder:  Completely decompressed around a Foley catheter.  IMPRESSION: 1. No hydronephrosis. 2. Chronic medical renal disease.   Electronically Signed   By: Jorje Guild M.D.   On: 04/11/2014 06:17   Dg Chest Port 1 View  04/11/2014   CLINICAL DATA:  Possible pneumonia.  EXAM: PORTABLE CHEST - 1 VIEW  COMPARISON:  Chest x-ray 04/09/2014.  FINDINGS: Status post median sternotomy. Epicardial wires are seen projecting over the right atrium. Abandoned wires projecting over the upper right hemithorax presumably from prior pacemaker. Lung volumes are low. There are some ill-defined bibasilar opacities favored to reflect some mild subsegmental atelectasis. No consolidative airspace disease. No pleural effusions. No pneumothorax. No evidence of pulmonary edema. Heart size is borderline enlarged. Upper mediastinal contours are unremarkable. Atherosclerosis in the thoracic aorta.  IMPRESSION: 1. Low lung volumes with probable bibasilar subsegmental atelectasis. 2. Atherosclerosis. 3. Postoperative changes and support apparatus, as above.   Electronically Signed   By: Vinnie Langton M.D.   On: 04/11/2014 11:24    Oren Binet, MD  [facility] Pager:[phone]  If 7PM-7AM, please contact night-coverage [email or web address] Password [email or web address] 04/21/2014, 9:55 AM   LOS: 12 days   **Disclaimer: This note may have been dictated with voice recognition software. Similar sounding words can inadvertently be transcribed and this note may contain transcription errors which may not have been corrected upon publication of note.**

## 2014-04-21 NOTE — Consult Note (Signed)
[name]   Reason for Consult:  Bipolar disorder, lithium toxicity and AMS Referring Physician:  Dr. Maryfrances Bunnell Jeffrey Herring is an 63 y.o. male. Total Time spent with patient: 45 minutes  Assessment: AXIS I:  Bipolar, Depressed AXIS II:  Deferred AXIS III:   Past Medical History  Diagnosis Date  . Hyperlipemia   . Pacemaker   . Arrhythmia   . Diabetes insipidus   . History of kidney stones   . History of DVT (deep vein thrombosis)   . History of pulmonary embolism    AXIS IV:  other psychosocial or environmental problems, problems related to social environment and problems with primary support group AXIS V:  31-40 impairment in reality testing  Plan:  Spoke with staff RN No evidence of imminent risk to self or others at present.   Supportive therapy provided about ongoing stressors. Patient benefit from SNF when medcially stable, Contact psych social service regarding appropriate placement. Continue Zyprexa Zydis 10 mg PO Qhs for bipolar manic symptoms  May use Haldol and Ativan as needed for agitation and aggressive behaviors Appreciate psychiatric consultation and followup as clinically required  Subjective:   Jeffrey Herring is a 63 y.o. male patient admitted with AMS.  HPI: Patient was seen and chart reviewed. Patient was not able to contribute to the evaluation secondary to being sedated with medication. Patient has renal sonogram planned for today. Staff RN reported patient has been irritable, restless and poorly cooperative.   Interval History: Patient continue to be confused but better today and able to oriented to place and person but not for the situation. He appeared sitting in his chair,  Awake, alert and oriented. Patient has a Air cabin crew in his room. Reportedly patient has slow clinical improvement and status post lithium toxicity. He can not hold a conversation at this time but able answer few direct questions.  Medical history:  Jeffrey Herring is a 63 y.o. male With a hx of bipolar disorder, DI, hx pacemaker placement who presents to the ED with increased confusion and agitation over the past week prior to admi. In the ED, the patient was found to have a low grade temp of 100F with CXR findings suggestive of R base infiltrate with WBC of over 12K and Cr of 3.12 (previous of over 2). The patient was started on empiric azithromycin and rocephin and the hospitalist service consulted for consideration for admission.   Review of Systems:  Per above, the remainder of the 10pt ros reviewed and are neg   HPI Elements:  Location:  Lithium toxicity. Quality:  Sedation and confusion. Severity:  Acute. Timing:  Unknown causes of lithium toxicity.  Past Psychiatric History: Past Medical History  Diagnosis Date  . Hyperlipemia   . Pacemaker   . Arrhythmia   . Diabetes insipidus   . History of kidney stones   . History of DVT (deep vein thrombosis)   . History of pulmonary embolism     reports that he has been smoking Cigars.  He has never used smokeless tobacco. He reports that he does not drink alcohol or use illicit drugs. Family History  Problem Relation Age of Onset  . Depression Mother      Living Arrangements: Spouse/significant other   Abuse/Neglect [facility]) Physical Abuse: Denies Verbal Abuse: Denies Sexual Abuse: Denies Allergies:   Allergies  Allergen Reactions  . Aspirin Hives  . Codeine Nausea And Vomiting  . Demerol [Meperidine] Nausea And Vomiting  ACT Assessment Complete:  NO Objective: Blood pressure 120/54, pulse 64, temperature 98.2 F (36.8 C), temperature source Oral, resp. rate 16, height 5' 10" (1.778 m), weight 68.221 kg (150 lb 6.4 oz), SpO2 98.00%.Body mass index is 21.58 kg/(m^2). Results for orders placed during the hospital encounter of 04/09/14 (from the past 72 hour(s))  RENAL FUNCTION PANEL     Status: Abnormal   Collection Time    04/19/14  5:02 AM      Result Value Ref  Range   Sodium 144  137 - 147 mEq/L   Potassium 3.9  3.7 - 5.3 mEq/L   Chloride 109  96 - 112 mEq/L   CO2 22  19 - 32 mEq/L   Glucose, Bld 106 (*) 70 - 99 mg/dL   BUN 14  6 - 23 mg/dL   Creatinine, Ser 2.00 (*) 0.50 - 1.35 mg/dL   Calcium 9.0  8.4 - 10.5 mg/dL   Phosphorus 3.1  2.3 - 4.6 mg/dL   Albumin 3.0 (*) 3.5 - 5.2 g/dL   GFR calc non Af Amer 34 (*) >90 mL/min   GFR calc Af Amer 39 (*) >90 mL/min   Comment: (NOTE)     The eGFR has been calculated using the CKD EPI equation.     This calculation has not been validated in all clinical situations.     eGFR's persistently <90 mL/min signify possible Chronic Kidney     Disease.   Anion gap 13  5 - 15  BASIC METABOLIC PANEL     Status: Abnormal   Collection Time    04/21/14  9:15 AM      Result Value Ref Range   Sodium 145  137 - 147 mEq/L   Potassium 4.2  3.7 - 5.3 mEq/L   Chloride 109  96 - 112 mEq/L   CO2 21  19 - 32 mEq/L   Glucose, Bld 117 (*) 70 - 99 mg/dL   BUN 13  6 - 23 mg/dL   Creatinine, Ser 1.98 (*) 0.50 - 1.35 mg/dL   Calcium 9.4  8.4 - 10.5 mg/dL   GFR calc non Af Amer 34 (*) >90 mL/min   GFR calc Af Amer 39 (*) >90 mL/min   Comment: (NOTE)     The eGFR has been calculated using the CKD EPI equation.     This calculation has not been validated in all clinical situations.     eGFR's persistently <90 mL/min signify possible Chronic Kidney     Disease.   Anion gap 15  5 - 15   Labs are reviewed and are pertinent for lithium toxicity and increased bun and cr.  Current Facility-Administered Medications  Medication Dose Route Frequency Provider Last Rate Last Dose  . 0.45 % sodium chloride infusion   Intravenous Continuous Jonetta Osgood, MD 100 mL/hr at 04/21/14 1251    . acetaminophen (TYLENOL) tablet 650 mg  650 mg Oral Q6H PRN Donne Hazel, MD       Or  . acetaminophen (TYLENOL) suppository 650 mg  650 mg Rectal Q6H PRN Donne Hazel, MD      . ALPRAZolam [name]) tablet 1 mg  1 mg Oral TID Donne Hazel, MD   1 mg at 04/21/14 1553  . bisacodyl (DULCOLAX) suppository 10 mg  10 mg Rectal Daily PRN Jonetta Osgood, MD      . clopidogrel (PLAVIX) tablet 75 mg  75 mg Oral q morning - 10a Orpah Melter  Wyline Copas, MD   75 mg at 04/21/14 1058  . darbepoetin (ARANESP) injection 100 mcg  100 mcg Subcutaneous Q Sat-1800 Louis Meckel, MD   100 mcg at 04/11/14 1845  . feeding supplement (ENSURE COMPLETE) (ENSURE COMPLETE) liquid 237 mL  237 mL Oral BID BM Heather Cornelison Pitts, RD   237 mL at 04/21/14 1401  . haloperidol lactate (HALDOL) injection 5 mg  5 mg Intravenous Q6H PRN Donne Hazel, MD   5 mg at 04/19/14 1456  . heparin injection 5,000 Units  5,000 Units Subcutaneous 3 times per day Donne Hazel, MD   5,000 Units at 04/21/14 1401  . LORazepam (ATIVAN) injection 1 mg  1 mg Intravenous Q6H PRN Verlee Monte, MD   1 mg at 04/19/14 2345  . metoprolol succinate (TOPROL-XL) 24 hr tablet 25 mg  25 mg Oral q morning - 10a Donne Hazel, MD   25 mg at 04/21/14 1058  . OLANZapine zydis (ZYPREXA) disintegrating tablet 10 mg  10 mg Oral QHS Durward Parcel, MD   10 mg at 04/20/14 2018  . ondansetron (ZOFRAN) tablet 4 mg  4 mg Oral Q6H PRN Donne Hazel, MD       Or  . ondansetron [facility]) injection 4 mg  4 mg Intravenous Q6H PRN Donne Hazel, MD      . pneumococcal 23 valent vaccine (PNU-IMMUNE) injection 0.5 mL  0.5 mL Intramuscular Tomorrow-1000 Mutaz Elmahi, MD      . polyethylene glycol (MIRALAX / GLYCOLAX) packet 17 g  17 g Oral Daily Verlee Monte, MD   17 g at 04/21/14 1058  . senna (SENOKOT) tablet 17.2 mg  2 tablet Oral QHS Ritta Slot, NP   17.2 mg at 04/20/14 2113  . simvastatin (ZOCOR) tablet 10 mg  10 mg Oral QHS Donne Hazel, MD   10 mg at 04/20/14 2113  . sodium bicarbonate tablet 1,300 mg  1,300 mg Oral BID Louis Meckel, MD   1,300 mg at 04/21/14 1058  . sodium chloride 0.9 % injection 3 mL  3 mL Intravenous Q12H Donne Hazel, MD   3 mL at 04/20/14 2143  .  sodium phosphate (FLEET) 7-19 GM/118ML enema 1 enema  1 enema Rectal Daily PRN Jonetta Osgood, MD      . tamsulosin (FLOMAX) capsule 0.4 mg  0.4 mg Oral q morning - 10a Donne Hazel, MD   0.4 mg at 04/21/14 1058  . traZODone (DESYREL) tablet 100 mg  100 mg Oral QHS Donne Hazel, MD   100 mg at 04/20/14 2113    Psychiatric Specialty Exam: Physical Exam  ROS  Blood pressure 120/54, pulse 64, temperature 98.2 F (36.8 C), temperature source Oral, resp. rate 16, height 5' 10" (1.778 m), weight 68.221 kg (150 lb 6.4 oz), SpO2 98.00%.Body mass index is 21.58 kg/(m^2).  General Appearance: Guarded  Eye Contact::  Fair  Speech:  Blocked  Volume:  Normal  Mood:  Anxious and Depressed  Affect:  Non-Congruent and Inappropriate  Thought Process:  Coherent and Goal Directed  Orientation:  NA  Thought Content:  Rumination  Suicidal Thoughts:  No  Homicidal Thoughts:  No  Memory:  Remote;   Fair  Judgement:  Impaired  Insight:  NA and Lacking  Psychomotor Activity:  Restlessness  Concentration:  Poor  Recall:  Poor  Fund of Knowledge:Fair  Language: Fair  Akathisia:  NA  Handed:  Right  AIMS (if indicated):  Assets:  Catering manager Housing Intimacy Leisure Time Resilience Social Support Talents/Skills  Sleep:      Musculoskeletal: Strength & Muscle Tone: decreased Gait & Station: unable to stand Patient leans: N/A  Treatment Plan Summary: Daily contact with patient to assess and evaluate symptoms and progress in treatment Medication management  ,JANARDHAHA R. 04/21/2014 4:40 PM

## 2014-04-21 NOTE — Clinical Social Work Note (Signed)
CSW spoke with wife and updated on current situation regarding SNF beds. Patient will have to be cleared by psychiatry to go to a SNF and MUST be without sitter for 24-48hrs.   Liz Beach MSW, [place], [place], JI:[id number]

## 2014-04-21 NOTE — Progress Notes (Signed)
Subjective: Patient has no complaints, he remains slightly drowsy.  He is more conversational.   Objective: Current vital signs: BP 136/61  Pulse 64  Temp(Src) 98.2 F (36.8 C) (Oral)  Resp 16  Ht 5\' 10"  (1.778 m)  Wt 68.221 kg (150 lb 6.4 oz)  BMI 21.58 kg/m2  SpO2 97% Vital signs in last 24 hours: Temp:  [98.2 F (36.8 C)-98.8 F (37.1 C)] 98.2 F (36.8 C) (07/07 0556) Pulse Rate:  [64-84] 64 (07/07 0556) Resp:  [16-18] 16 (07/07 0556) BP: (123-147)/(61-74) 136/61 mmHg (07/07 0556) SpO2:  [93 %-98 %] 97 % (07/07 0556)  Intake/Output from previous day: 07/06 0701 - 07/07 0700 In: 1300 [P.O.:300; I.V.:1000] Out: 3100 [Urine:3100] Intake/Output this shift: Total I/O In: 920 [P.O.:920] Out: 825 [Urine:825] Nutritional status: Cardiac  Neurologic Exam: Mental Status: Alert, more conversational today, 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  Cranial Nerves: II: Visual fields grossly normal, pupils equal, round, reactive to light and accommodation III,IV, VI: ptosis not present, extra-ocular motions intact bilaterally V,VII: smile symmetric, facial light touch sensation normal bilaterally VIII: hearing normal bilaterally IX,X: gag reflex present XI: bilateral shoulder shrug XII: midline tongue extension without atrophy or fasciculations  Motor: Moving all extremities with good strength and following simple commands.  Sensory: Pinprick and light touch intact throughout, bilaterally Deep Tendon Reflexes:  Right: Upper Extremity   Left: Upper extremity   biceps (C-5 to C-6) 2/4   biceps (C-5 to C-6) 2/4 tricep (C7) 2/4    triceps (C7) 2/4 Brachioradialis (C6) 2/4  Brachioradialis (C6) 2/4  Lower Extremity Lower Extremity  quadriceps (L-2 to L-4) 2/4   quadriceps (L-2 to L-4) 2/4 Achilles (S1) 0/4   Achilles (S1) 0/4  Plantars: Right: downgoing   Left: downgoing    Lab Results: Basic Metabolic Panel:  Recent Labs Lab 04/15/14 1653  04/16/14 0435 04/17/14 0400 04/18/14 0417 04/19/14 0502 04/21/14 0915  NA 149* 150* 145 147 144 145  K 4.1 3.9 4.0 4.1 3.9 4.2  CL 113* 112 110 110 109 109  CO2 21 23 23 23 22 21   GLUCOSE 106* 97 93 90 106* 117*  BUN 28* 26* 20 17 14 13   CREATININE 2.28* 2.23* 2.07* 2.04* 2.00* 1.98*  CALCIUM 10.2 9.9 9.3 9.5 9.0 9.4  PHOS 2.8 2.9 2.8 3.6 3.1  --     Liver Function Tests:  Recent Labs Lab 04/15/14 1653 04/16/14 0435 04/17/14 0400 04/18/14 0417 04/19/14 0502  ALBUMIN 3.7 3.6 3.1* 3.2* 3.0*   No results found for this basename: LIPASE, AMYLASE,  in the last 168 hours No results found for this basename: AMMONIA,  in the last 168 hours  CBC:  Recent Labs Lab 04/17/14 0400  WBC 6.9  HGB 9.5*  HCT 30.7*  MCV 99.0  PLT 132*    Cardiac Enzymes: No results found for this basename: CKTOTAL, CKMB, CKMBINDEX, TROPONINI,  in the last 168 hours  Lipid Panel: No results found for this basename: CHOL, TRIG, HDL, CHOLHDL, VLDL, LDLCALC,  in the last 168 hours  CBG: No results found for this basename: GLUCAP,  in the last 168 hours  Microbiology: Results for orders placed during the hospital encounter of 04/09/14  URINE CULTURE     Status: None   Collection Time    04/09/14 12:09 PM      Result Value Ref Range Status   Specimen Description URINE, CLEAN CATCH   Final   Special Requests  NONE   Final   Culture  Setup Time     Final   Value: 04/09/2014 19:48     Performed at [facility]     Final   Value: NO GROWTH     Performed at [facility]   Culture     Final   Value: NO GROWTH     Performed at [facility]   Report Status 04/10/2014 FINAL   Final  CULTURE, BLOOD (ROUTINE X 2)     Status: None   Collection Time    04/09/14  3:01 PM      Result Value Ref Range Status   Specimen Description BLOOD FOREARM   Final   Special Requests BOTTLES DRAWN AEROBIC AND ANAEROBIC 6ML   Final   Culture  Setup Time     Final   Value:  04/09/2014 19:05     Performed at [facility]   Culture     Final   Value: NO GROWTH 5 DAYS     Performed at [facility]   Report Status 04/15/2014 FINAL   Final  CULTURE, BLOOD (ROUTINE X 2)     Status: None   Collection Time    04/09/14  3:01 PM      Result Value Ref Range Status   Specimen Description BLOOD BLOOD LEFT FOREARM   Final   Special Requests     Final   Value: BOTTLES DRAWN AEROBIC AND ANAEROBIC 6ML POST ANTIBIOTICS   Culture  Setup Time     Final   Value: 04/09/2014 19:05     Performed at [facility]   Culture     Final   Value: NO GROWTH 5 DAYS     Performed at [facility]   Report Status 04/15/2014 FINAL   Final    Coagulation Studies: No results found for this basename: LABPROT, INR,  in the last 72 hours  Imaging: No results found.  Medications:  Scheduled: . ALPRAZolam  1 mg Oral TID  . clopidogrel  75 mg Oral q morning - 10a  . darbepoetin (ARANESP) injection - NON-DIALYSIS  100 mcg Subcutaneous Q Sat-1800  . feeding supplement (ENSURE COMPLETE)  237 mL Oral BID BM  . heparin  5,000 Units Subcutaneous 3 times per day  . metoprolol succinate  25 mg Oral q morning - 10a  . OLANZapine zydis  10 mg Oral QHS  . pneumococcal 23 valent vaccine  0.5 mL Intramuscular Tomorrow-1000  . polyethylene glycol  17 g Oral Daily  . senna  2 tablet Oral QHS  . simvastatin  10 mg Oral QHS  . sodium bicarbonate  1,300 mg Oral BID  . sodium chloride  3 mL Intravenous Q12H  . tamsulosin  0.4 mg Oral q morning - 10a  . traZODone  100 mg Oral QHS    Assessment/Plan: Patient continue to show slow improvement. He is not sleeping well at night per staff but now calm and able to have short conversation. At this time, no further recommendations per neurology.  Psych to make recommendations on Lithium and psychiatric medications.   Neurology S/O  Etta Quill PA-C [facility] [phone]  04/21/2014, 12:25 PM

## 2014-04-22 LAB — CBC
HCT: 29.4 % — ABNORMAL LOW (ref 39.0–52.0)
Hemoglobin: 8.9 g/dL — ABNORMAL LOW (ref 13.0–17.0)
MCH: 30.4 pg (ref 26.0–34.0)
MCHC: 30.3 g/dL (ref 30.0–36.0)
MCV: 100.3 fL — ABNORMAL HIGH (ref 78.0–100.0)
Platelets: 133 10*3/uL — ABNORMAL LOW (ref 150–400)
RBC: 2.93 MIL/uL — ABNORMAL LOW (ref 4.22–5.81)
RDW: 14.9 % (ref 11.5–15.5)
WBC: 3.9 10*3/uL — ABNORMAL LOW (ref 4.0–10.5)

## 2014-04-22 LAB — BASIC METABOLIC PANEL
Anion gap: 13 (ref 5–15)
BUN: 13 mg/dL (ref 6–23)
CO2: 23 mEq/L (ref 19–32)
Calcium: 9.1 mg/dL (ref 8.4–10.5)
Chloride: 109 mEq/L (ref 96–112)
Creatinine, Ser: 2 mg/dL — ABNORMAL HIGH (ref 0.50–1.35)
GFR calc Af Amer: 39 mL/min — ABNORMAL LOW (ref >90)
GFR calc non Af Amer: 34 mL/min — ABNORMAL LOW (ref >90)
Glucose, Bld: 95 mg/dL (ref 70–99)
Potassium: 4 mEq/L (ref 3.7–5.3)
Sodium: 145 mEq/L (ref 137–147)

## 2014-04-22 MED ORDER — OLANZAPINE 10 MG PO TBDP: 10.0000 mg | ORAL_TABLET | Freq: Every day | ORAL | Status: DC

## 2014-04-22 MED ORDER — HALOPERIDOL 5 MG PO TABS: 5.0000 mg | ORAL_TABLET | Freq: Four times a day (QID) | ORAL | Status: DC | PRN

## 2014-04-22 NOTE — Discharge Summary (Signed)
Physician Discharge Summary  ANKITH ISSA [id number] DOB: [birth year] DOA: 04/09/2014  PCP: Ashok Norris, MD  Admit date: 04/09/2014 Discharge date: 04/23/2014  Time spent: 35 minutes  Recommendations for Outpatient Follow-up:  1. Follow up with PCP in 1-2 weeks 2. Would avoid lithium in the future  Discharge Diagnoses:  Principal Problem:   Bipolar 1 disorder Active Problems:   Inguinal hernia   Cardiac pacemaker in situ   Acute renal failure   Leukocytosis   Pneumonia   Sepsis due to pneumonia   Lithium toxicity   Protein-calorie malnutrition, severe  Discharge Condition: Improved  Diet recommendation: Regular  Filed Weights   04/09/14 1820  Weight: 150 lb 6.4 oz (68.221 kg)    History of present illness:  Please see admission H and P from 6/25 for further details. Briefly, pt presented to the ED with increased confusion with renal failure and found to have a super-therapeutic lithium level. The patient was admitted and Nephrology consulted for further assistance.  Hospital Course:  Acute encephalopathy  - Etiology multifacorial, with consideration for metabolic encephalopathy, lithium toxicity, worsening renal failure, UTI/community acquired pneumonia.  - Per Neurology mental status expected to slowly improve - will monitor  - CT head on 6/30 negative. Unable to do MRI as the patient has a cardiac pacemaker.EEG negative for epileptiform waves.  - Neurology/Psych were consulted and had been following - Mental status is slowly improving  Acute on chronic kidney disease stage III  - Multi-factorial etiology for acute renal failure-including eating toxicity, hypercalcemia, and obstructive uropathy. Nephrology was initially consulted and had been following - creatinine significantly improved and now close to her usual baseline s/p hydration Acute urinary retention  - Resolved - Renal ultrasound w/o hydronephrosis.  - Continued Flomax  Hypernatremia  -Suspected  DI secondary to Lithium toxicity  - Now resolved  Metabolic acidosis  - Secondary to worsening kidney function  -better. C/w oral bicarbonate.  Suspected community acquired pneumonia  - Remains afebrile, no leukocytosis. Does not have a toxic appearance.  - Has finished a course of IV antibiotics including Rocephin/Zithromax, with transition to oral Levaquin on 6/29 for 2 doses - last dose on 7/1  Long-standing history of bipolar disorder  - Presented with Lithium toxicity, has been evaluated by psychiatry - Lithium discontinued permanently per Nephrology recs - Started on Zyprexa  History of chronic systolic heart failure  - Clinically compensated. Continue to monitor carefully while on IV fluids.  - ACE inhibitor discontinued given worsening kidney function, continue cautiously with beta blocker blood pressure permitting.  History of complete heart block  - Pacemaker in place.  History of venous thromboembolism  - Previously was on Coumadin- currently no evidence of any recurrence. On heparin for DVT prophylaxis.  Severe Malnutrition  -on Ensure Complete po BID   Consultations:  Nephrology  Psychiatry  Neurology  Discharge Exam: Filed Vitals:   04/22/14 1434 04/22/14 2213 04/23/14 0647 04/23/14 1030  BP: 118/68 115/47 135/74 138/76  Pulse: 90 67 66 70  Temp: 98.3 F (36.8 C) 98.2 F (36.8 C) 97.1 F (36.2 C)   TempSrc: Oral Oral Oral   Resp: 16 16 16    Height:      Weight:      SpO2: 95% 97% 96%     General: awake, in nad Cardiovascular: regular, s1, s2 Respiratory: normal resp effort, no wheezing  Discharge Instructions     Medication List    STOP taking these medications       FLUoxetine  10 MG capsule  Commonly known as:  PROZAC     lisinopril 5 MG tablet  Commonly known as:  PRINIVIL,ZESTRIL     lithium carbonate 150 MG capsule      TAKE these medications       ALPRAZolam 1 MG tablet  Commonly known as:  XANAX  Take 1 mg by mouth 3 (three)  times daily.     clopidogrel 75 MG tablet  Commonly known as:  PLAVIX  Take 75 mg by mouth every morning.     haloperidol 5 MG tablet  Commonly known as:  HALDOL  Take 1 tablet (5 mg total) by mouth every 6 (six) hours as needed for agitation.     metoprolol succinate 25 MG 24 hr tablet  Commonly known as:  TOPROL-XL  Take 25 mg by mouth every morning.     OLANZapine zydis 10 MG disintegrating tablet  Commonly known as:  ZYPREXA  Take 1 tablet (10 mg total) by mouth at bedtime.     simvastatin 10 MG tablet  Commonly known as:  ZOCOR  Take 10 mg by mouth at bedtime.     tamsulosin 0.4 MG Caps capsule  Commonly known as:  FLOMAX  Take 0.4 mg by mouth every morning.     traZODone 100 MG tablet  Commonly known as:  DESYREL  Take 100 mg by mouth at bedtime.       Allergies  Allergen Reactions  . Aspirin Hives  . Codeine Nausea And Vomiting  . Demerol [Meperidine] Nausea And Vomiting   Follow-up Information   Follow up with MORRISEY,LEMONT, MD. Schedule an appointment as soon as possible for a visit in 1 week.   Specialty:  Family Medicine   Contact information:   [facility] [address] [place] [address] [phone]        The results of significant diagnostics from this hospitalization (including imaging, microbiology, ancillary and laboratory) are listed below for reference.    Significant Diagnostic Studies: Dg Chest 2 View  04/09/2014   CLINICAL DATA:  Fever.  EXAM: CHEST  2 VIEW  COMPARISON:  Chest x-ray 01/02/2004.  FINDINGS: Mediastinum and hilar structures are normal. Stable borderline cardiomegaly. Prior CABG. Pacer leads are noted over the chest. Previously identified pacemaker has been removed. Right base atelectasis and/or infiltrate. No significant pleural effusion. No pneumothorax. No acute bony abnormality.  IMPRESSION: 1. Right base atelectasis and/or infiltrate. Follow-up chest x-ray to demonstrate clearing  suggested. 2. Prior CABG.  Borderline cardiomegaly, no CHF.   Electronically Signed   By: Marcello Moores  Register   On: 04/09/2014 12:52   Ct Head Wo Contrast  04/14/2014   CLINICAL DATA:  Mental status change and confusion  EXAM: CT HEAD WITHOUT CONTRAST  TECHNIQUE: Contiguous axial images were obtained from the base of the skull through the vertex without intravenous contrast.  COMPARISON:  There are no previous studies for comparison.  FINDINGS: The study is limited due to motion artifact. The ventricles are normal in size and position. There is a focus of encephalomalacia in the posterior right parietal lobe. There is no acute intracranial hemorrhage nor objective evidence of an evolving ischemic event. The cerebellum and brainstem are grossly normal.  At bone window settings as best as can be determined there is no acute skull fracture. The observed paranasal sinuses and mastoid air cells are clear.  IMPRESSION: The study is limited due to motion artifact. There is no definite acute intracranial abnormality. Hypodensity in  the right posterior parietal lobe is consistent with encephalomalacia.   Electronically Signed   By: David  Martinique   On: 04/14/2014 15:18   US Renal  04/11/2014   CLINICAL DATA:  Acute kidney injury and urinary tract infection  EXAM: RENAL/URINARY TRACT ULTRASOUND COMPLETE  COMPARISON:  None.  FINDINGS: Right Kidney:  Length: 10 cm. Echogenic cortex with abnormal cortical medullary differentiation. There is a 9 mm simple appearing cyst in the interpolar region. No hydronephrosis or evidence of solid mass.  Left Kidney:  Length: 10 cm. Echogenic cortex with abnormal cortical medullary differentiation. No hydronephrosis or evidence of mass.  Bladder:  Completely decompressed around a Foley catheter.  IMPRESSION: 1. No hydronephrosis. 2. Chronic medical renal disease.   Electronically Signed   By: Jorje Guild M.D.   On: 04/11/2014 06:17   Dg Chest Port 1 View  04/11/2014   CLINICAL DATA:   Possible pneumonia.  EXAM: PORTABLE CHEST - 1 VIEW  COMPARISON:  Chest x-ray 04/09/2014.  FINDINGS: Status post median sternotomy. Epicardial wires are seen projecting over the right atrium. Abandoned wires projecting over the upper right hemithorax presumably from prior pacemaker. Lung volumes are low. There are some ill-defined bibasilar opacities favored to reflect some mild subsegmental atelectasis. No consolidative airspace disease. No pleural effusions. No pneumothorax. No evidence of pulmonary edema. Heart size is borderline enlarged. Upper mediastinal contours are unremarkable. Atherosclerosis in the thoracic aorta.  IMPRESSION: 1. Low lung volumes with probable bibasilar subsegmental atelectasis. 2. Atherosclerosis. 3. Postoperative changes and support apparatus, as above.   Electronically Signed   By: Vinnie Langton M.D.   On: 04/11/2014 11:24    Microbiology: No results found for this or any previous visit (from the past 240 hour(s)).   Labs: Basic Metabolic Panel:  Recent Labs Lab 04/17/14 0400 04/18/14 0417 04/19/14 0502 04/21/14 0915 04/22/14 0606  NA 145 147 144 145 145  K 4.0 4.1 3.9 4.2 4.0  CL 110 110 109 109 109  CO2 23 23 22 21 23   GLUCOSE 93 90 106* 117* 95  BUN 20 17 14 13 13   CREATININE 2.07* 2.04* 2.00* 1.98* 2.00*  CALCIUM 9.3 9.5 9.0 9.4 9.1  PHOS 2.8 3.6 3.1  --   --    Liver Function Tests:  Recent Labs Lab 04/17/14 0400 04/18/14 0417 04/19/14 0502  ALBUMIN 3.1* 3.2* 3.0*   No results found for this basename: LIPASE, AMYLASE,  in the last 168 hours No results found for this basename: AMMONIA,  in the last 168 hours CBC:  Recent Labs Lab 04/17/14 0400 04/22/14 0606  WBC 6.9 3.9*  HGB 9.5* 8.9*  HCT 30.7* 29.4*  MCV 99.0 100.3*  PLT 132* 133*   Cardiac Enzymes: No results found for this basename: CKTOTAL, CKMB, CKMBINDEX, TROPONINI,  in the last 168 hours BNP: BNP (last 3 results) No results found for this basename: PROBNP,  in the last  8760 hours CBG: No results found for this basename: GLUCAP,  in the last 168 hours  Signed:  Hakop Humbarger K  [facility] 04/23/2014, 10:45 AM

## 2014-04-22 NOTE — Progress Notes (Signed)
Sitter discontinue per MD verbal order

## 2014-04-22 NOTE — Progress Notes (Signed)
Patient resting comfortably, No distreess noted

## 2014-04-22 NOTE — Progress Notes (Signed)
[facility] PROGRESS NOTE  Jeffrey Herring [id number] DOB: Jan 30, 1951 DOA: 04/09/2014 PCP: Ashok Norris, MD  Assessment/Plan: Acute encephalopathy  - Etiology multifacorial, current suspicion for metabolic encephalopathy- given lithium toxicity, worsening renal failure, UTI/community acquired pneumonia. - Per Neurology mental status expected to slowly improve - will monitor - CT head on 6/30 negative. Unable to do MRI as the patient has a cardiac pacemaker.EEG negative for epileptiform waves.  -Neurology/Psych following.  - Not yet at baseline - Plan to discharge to Geri-psych unit ultimately   Acute on chronic kidney disease stage III  - Multi-factorial etiology for acute renal failure-including eating toxicity, hypercalcemia, and obstructive uropathy. Nephrology following, supportive care given, creatinine significantly improved and now close to her usual baseline. Acute urinary retention  - Resolved. Intermittent Foley catheter. Renal ultrasound w/o hydronephrosis.  - Continue Flomax  Hypernatremia  -Suspected DI secondary to Lithium toxicity - Now resolved Metabolic acidosis  - Secondary to worsening kidney function  -better. C/w oral bicarbonate.  Suspected community acquired pneumonia  - Remains afebrile, no leukocytosis. Does not have a toxic appearance.  - Has finished a course of IV antibiotics including Rocephin/Zithromax, with transition to oral Levaquin on 6/29 for 2 doses- last dose on 7/1 - Monitor for now Long-standing history of bipolar disorder  - Presented with Lithium toxicity, has been evaluated by psychiatry-Lithium discontinued permanently-started on Zyprexa  History of chronic systolic heart failure  - Clinically compensated. Continue to monitor carefully while on IV fluids.  - ACE inhibitor discontinued given worsening kidney function, continue cautiously with beta blocker blood pressure permitting.  History of complete heart block  - Pacemaker in  place.  History of venous thromboembolism  - Previously was on Coumadin- currently no evidence of any recurrence. On heparin for DVT prophylaxis.  Severe Malnutrition  -c/w Ensure Complete po BID  Disposition:  Remain inpatient  DVT Prophylaxis:  Prophylactic Heparin  Code Status: Full Family Communication: Pt in room Disposition Plan: Pending  Consultants:  Neurology  Psychiatry  Procedures:    Antibiotics:   HPI/Subjective: No acute events noted. Pt noted to follow commands better today.  Objective: Filed Vitals:   04/21/14 1300 04/21/14 2002 04/22/14 0620 04/22/14 0835  BP: 120/54 132/72 113/63 132/92  Pulse: 64 69 62 102  Temp: 98.2 F (36.8 C) 98.6 F (37 C) 98.3 F (36.8 C)   TempSrc: Oral Oral Oral   Resp: 16 16 16    Height:      Weight:      SpO2: 98% 95% 96%     Intake/Output Summary (Last 24 hours) at 04/22/14 1127 Last data filed at 04/22/14 1120  Gross per 24 hour  Intake   1480 ml  Output   2425 ml  Net   -945 ml   Filed Weights   04/09/14 1820  Weight: 150 lb 6.4 oz (68.221 kg)    Exam:   General:  Awake, in nad  Cardiovascular: regular, s1, s2  Respiratory: normal resp effort, no wheezing  Abdomen: soft, nondistended  Musculoskeletal: perfused, no clubbing   Data Reviewed: Basic Metabolic Panel:  Recent Labs Lab 04/15/14 1653 04/16/14 0435 04/17/14 0400 04/18/14 0417 04/19/14 0502 04/21/14 0915 04/22/14 0606  NA 149* 150* 145 147 144 145 145  K 4.1 3.9 4.0 4.1 3.9 4.2 4.0  CL 113* 112 110 110 109 109 109  CO2 21 23 23 23 22 21 23   GLUCOSE 106* 97 93 90 106* 117* 95  BUN 28* 26* 20 17 14  13  13  CREATININE 2.28* 2.23* 2.07* 2.04* 2.00* 1.98* 2.00*  CALCIUM 10.2 9.9 9.3 9.5 9.0 9.4 9.1  PHOS 2.8 2.9 2.8 3.6 3.1  --   --    Liver Function Tests:  Recent Labs Lab 04/15/14 1653 04/16/14 0435 04/17/14 0400 04/18/14 0417 04/19/14 0502  ALBUMIN 3.7 3.6 3.1* 3.2* 3.0*   No results found for this basename:  LIPASE, AMYLASE,  in the last 168 hours No results found for this basename: AMMONIA,  in the last 168 hours CBC:  Recent Labs Lab 04/17/14 0400 04/22/14 0606  WBC 6.9 3.9*  HGB 9.5* 8.9*  HCT 30.7* 29.4*  MCV 99.0 100.3*  PLT 132* 133*   Cardiac Enzymes: No results found for this basename: CKTOTAL, CKMB, CKMBINDEX, TROPONINI,  in the last 168 hours BNP (last 3 results) No results found for this basename: PROBNP,  in the last 8760 hours CBG: No results found for this basename: GLUCAP,  in the last 168 hours  No results found for this or any previous visit (from the past 240 hour(s)).   Studies: No results found.  Scheduled Meds: . ALPRAZolam  1 mg Oral TID  . clopidogrel  75 mg Oral q morning - 10a  . darbepoetin (ARANESP) injection - NON-DIALYSIS  100 mcg Subcutaneous Q Sat-1800  . feeding supplement (ENSURE COMPLETE)  237 mL Oral BID BM  . heparin  5,000 Units Subcutaneous 3 times per day  . metoprolol succinate  25 mg Oral q morning - 10a  . OLANZapine zydis  10 mg Oral QHS  . pneumococcal 23 valent vaccine  0.5 mL Intramuscular Tomorrow-1000  . polyethylene glycol  17 g Oral Daily  . senna  2 tablet Oral QHS  . simvastatin  10 mg Oral QHS  . sodium bicarbonate  1,300 mg Oral BID  . sodium chloride  3 mL Intravenous Q12H  . tamsulosin  0.4 mg Oral q morning - 10a  . traZODone  100 mg Oral QHS   Continuous Infusions: . sodium chloride 75 mL/hr at 04/21/14 2221    Principal Problem:   Bipolar 1 disorder Active Problems:   Inguinal hernia   Cardiac pacemaker in situ   Acute renal failure   Leukocytosis   Pneumonia   Sepsis due to pneumonia   Lithium toxicity   Protein-calorie malnutrition, severe  Time spent: 63min  [facility], [facility] Pager [phone]. If 7PM-7AM, please contact night-coverage at [email or web address], password [facility] 04/22/2014, 11:27 AM  LOS: 13 days

## 2014-04-22 NOTE — Progress Notes (Signed)
NUTRITION FOLLOW-UP  Pt meets criteria for SEVERE MALNUTRITION in the context of acute as evidenced by 6% weight loss in < 1 month and poor intake of </= 50% of his needs for >/= 5 days.  DOCUMENTATION CODES Per approved criteria  -Severe malnutrition in the context of acute illness or injury   INTERVENTION: Continue Ensure Complete po BID, each supplement provides 350 kcal and 13 grams of protein Please obtain current weight RD to continue to follow nutrition care plan  NUTRITION DIAGNOSIS: Inadequate oral intake related to altered mental status as evidenced by Meal Completion: <50%.  Ongoing.  Goal: Pt to meet >/= 90% of their estimated nutrition needs   Monitor:  PO intake, supplement acceptance, weight trend  ASSESSMENT: Pt with acute encephalopathy, suspected metabolic. Pt has had persistent altered mental status and poor intake this admission.   Psych continues to follow patient. Per note, pt continues to be confused. Plan is to discharge to a Geri-Psych unit, however patient has yet to return to his baseline.  Continues with orders for a Heart Healthy diet with Ensure Complete ordered PO BID. Pt is currently without sitter. Consumed <25% of his lunch. Per chart, pt is taking Ensure.  Height: Ht Readings from Last 1 Encounters:  04/09/14 5\' 10"  (1.778 m)    Weight: Wt Readings from Last 1 Encounters:  04/09/14 150 lb 6.4 oz (68.221 kg)    BMI:  Body mass index is 21.58 kg/(m^2). WNL  Estimated Nutritional Needs: Kcal: 1800-2000 Protein: 80-90 grams Fluid: >1.8L/day  Skin: intact  Diet Order: Cardiac Meal Completion: <50%    Intake/Output Summary (Last 24 hours) at 04/22/14 1225 Last data filed at 04/22/14 1120  Gross per 24 hour  Intake   1280 ml  Output   1600 ml  Net   -320 ml    Last BM: 7/7  Labs:   Recent Labs Lab 04/17/14 0400 04/18/14 0417 04/19/14 0502 04/21/14 0915 04/22/14 0606  NA 145 147 144 145 145  K 4.0 4.1 3.9 4.2 4.0   CL 110 110 109 109 109  CO2 23 23 22 21 23   BUN 20 17 14 13 13   CREATININE 2.07* 2.04* 2.00* 1.98* 2.00*  CALCIUM 9.3 9.5 9.0 9.4 9.1  PHOS 2.8 3.6 3.1  --   --   GLUCOSE 93 90 106* 117* 95    CBG (last 3)  No results found for this basename: GLUCAP,  in the last 72 hours  Scheduled Meds: . ALPRAZolam  1 mg Oral TID  . clopidogrel  75 mg Oral q morning - 10a  . darbepoetin (ARANESP) injection - NON-DIALYSIS  100 mcg Subcutaneous Q Sat-1800  . feeding supplement (ENSURE COMPLETE)  237 mL Oral BID BM  . heparin  5,000 Units Subcutaneous 3 times per day  . metoprolol succinate  25 mg Oral q morning - 10a  . OLANZapine zydis  10 mg Oral QHS  . pneumococcal 23 valent vaccine  0.5 mL Intramuscular Tomorrow-1000  . polyethylene glycol  17 g Oral Daily  . senna  2 tablet Oral QHS  . simvastatin  10 mg Oral QHS  . sodium bicarbonate  1,300 mg Oral BID  . sodium chloride  3 mL Intravenous Q12H  . tamsulosin  0.4 mg Oral q morning - 10a  . traZODone  100 mg Oral QHS    Continuous Infusions: . sodium chloride 75 mL/hr at 04/21/14 2221   Inda Coke MS, RD, LDN Inpatient Registered Dietitian Pager: [phone] After-hours  pager: [phone]

## 2014-04-23 DIAGNOSIS — I10 Essential (primary) hypertension: Secondary | ICD-10-CM | POA: Diagnosis not present

## 2014-04-23 DIAGNOSIS — F319 Bipolar disorder, unspecified: Secondary | ICD-10-CM | POA: Diagnosis not present

## 2014-04-23 DIAGNOSIS — R262 Difficulty in walking, not elsewhere classified: Secondary | ICD-10-CM | POA: Diagnosis not present

## 2014-04-23 DIAGNOSIS — Z86718 Personal history of other venous thrombosis and embolism: Secondary | ICD-10-CM | POA: Diagnosis not present

## 2014-04-23 DIAGNOSIS — D72829 Elevated white blood cell count, unspecified: Secondary | ICD-10-CM | POA: Diagnosis not present

## 2014-04-23 DIAGNOSIS — Z950 Presence of cardiac pacemaker: Secondary | ICD-10-CM | POA: Diagnosis not present

## 2014-04-23 DIAGNOSIS — F29 Unspecified psychosis not due to a substance or known physiological condition: Secondary | ICD-10-CM | POA: Diagnosis not present

## 2014-04-23 DIAGNOSIS — R279 Unspecified lack of coordination: Secondary | ICD-10-CM | POA: Diagnosis not present

## 2014-04-23 DIAGNOSIS — E43 Unspecified severe protein-calorie malnutrition: Secondary | ICD-10-CM | POA: Diagnosis not present

## 2014-04-23 DIAGNOSIS — N289 Disorder of kidney and ureter, unspecified: Secondary | ICD-10-CM | POA: Diagnosis not present

## 2014-04-23 DIAGNOSIS — Z87442 Personal history of urinary calculi: Secondary | ICD-10-CM | POA: Diagnosis not present

## 2014-04-23 DIAGNOSIS — J189 Pneumonia, unspecified organism: Secondary | ICD-10-CM | POA: Diagnosis not present

## 2014-04-23 DIAGNOSIS — E785 Hyperlipidemia, unspecified: Secondary | ICD-10-CM | POA: Diagnosis not present

## 2014-04-23 DIAGNOSIS — R4182 Altered mental status, unspecified: Secondary | ICD-10-CM | POA: Diagnosis not present

## 2014-04-23 DIAGNOSIS — R0989 Other specified symptoms and signs involving the circulatory and respiratory systems: Secondary | ICD-10-CM | POA: Diagnosis not present

## 2014-04-23 DIAGNOSIS — Z86711 Personal history of pulmonary embolism: Secondary | ICD-10-CM | POA: Diagnosis not present

## 2014-04-23 DIAGNOSIS — E232 Diabetes insipidus: Secondary | ICD-10-CM | POA: Diagnosis not present

## 2014-04-23 DIAGNOSIS — N189 Chronic kidney disease, unspecified: Secondary | ICD-10-CM | POA: Diagnosis not present

## 2014-04-23 DIAGNOSIS — I509 Heart failure, unspecified: Secondary | ICD-10-CM | POA: Diagnosis not present

## 2014-04-23 DIAGNOSIS — G9349 Other encephalopathy: Secondary | ICD-10-CM | POA: Diagnosis not present

## 2014-04-23 DIAGNOSIS — G9340 Encephalopathy, unspecified: Secondary | ICD-10-CM | POA: Diagnosis not present

## 2014-04-23 DIAGNOSIS — T6591XS Toxic effect of unspecified substance, accidental (unintentional), sequela: Secondary | ICD-10-CM | POA: Diagnosis not present

## 2014-04-23 DIAGNOSIS — N400 Enlarged prostate without lower urinary tract symptoms: Secondary | ICD-10-CM | POA: Diagnosis not present

## 2014-04-23 DIAGNOSIS — Z7902 Long term (current) use of antithrombotics/antiplatelets: Secondary | ICD-10-CM | POA: Diagnosis not present

## 2014-04-23 DIAGNOSIS — I499 Cardiac arrhythmia, unspecified: Secondary | ICD-10-CM | POA: Diagnosis not present

## 2014-04-23 DIAGNOSIS — G9389 Other specified disorders of brain: Secondary | ICD-10-CM | POA: Diagnosis not present

## 2014-04-23 DIAGNOSIS — I5022 Chronic systolic (congestive) heart failure: Secondary | ICD-10-CM | POA: Diagnosis not present

## 2014-04-23 DIAGNOSIS — F17200 Nicotine dependence, unspecified, uncomplicated: Secondary | ICD-10-CM | POA: Diagnosis not present

## 2014-04-23 DIAGNOSIS — K4090 Unilateral inguinal hernia, without obstruction or gangrene, not specified as recurrent: Secondary | ICD-10-CM | POA: Diagnosis not present

## 2014-04-23 DIAGNOSIS — N179 Acute kidney failure, unspecified: Secondary | ICD-10-CM | POA: Diagnosis not present

## 2014-04-23 DIAGNOSIS — E860 Dehydration: Secondary | ICD-10-CM | POA: Diagnosis not present

## 2014-04-23 DIAGNOSIS — F919 Conduct disorder, unspecified: Secondary | ICD-10-CM | POA: Diagnosis not present

## 2014-04-23 DIAGNOSIS — Z79899 Other long term (current) drug therapy: Secondary | ICD-10-CM | POA: Diagnosis not present

## 2014-04-23 DIAGNOSIS — M6281 Muscle weakness (generalized): Secondary | ICD-10-CM | POA: Diagnosis not present

## 2014-04-23 DIAGNOSIS — A419 Sepsis, unspecified organism: Secondary | ICD-10-CM | POA: Diagnosis not present

## 2014-04-23 MED ORDER — LORAZEPAM 2 MG/ML IJ SOLN
3.0000 mg | Freq: Once | INTRAMUSCULAR | Status: AC
  Administered 2014-04-23: 3 mg via INTRAVENOUS
  Filled 2014-04-23: qty 2

## 2014-04-23 MED ORDER — HALOPERIDOL LACTATE 5 MG/ML IJ SOLN
5.0000 mg | Freq: Once | INTRAMUSCULAR | Status: AC
  Administered 2014-04-23: 5 mg via INTRAVENOUS

## 2014-04-23 MED ORDER — ALPRAZOLAM 1 MG PO TABS: 1.0000 mg | ORAL_TABLET | Freq: Three times a day (TID) | ORAL | Status: DC

## 2014-04-23 MED ORDER — PNEUMOCOCCAL VAC POLYVALENT 25 MCG/0.5ML IJ INJ
0.5000 mL | INJECTION | INTRAMUSCULAR | Status: AC
  Administered 2014-04-23: 0.5 mL via INTRAMUSCULAR
  Filled 2014-04-23: qty 0.5

## 2014-04-23 MED ORDER — LORAZEPAM 2 MG/ML IJ SOLN
INTRAMUSCULAR | Status: AC
  Filled 2014-04-23: qty 1

## 2014-04-23 MED ORDER — LORAZEPAM 2 MG/ML IJ SOLN
2.0000 mg | Freq: Once | INTRAMUSCULAR | Status: AC
  Administered 2014-04-23: 2 mg via INTRAVENOUS

## 2014-04-23 NOTE — Progress Notes (Signed)
Patient noted to be severely agitated and verbally abusive for the duration of the shift despite numerous attempts with PRNs. All safety measures remain in place. Will continue to monitor per shift.

## 2014-04-23 NOTE — Progress Notes (Signed)
NURSING PROGRESS NOTE  SUNNIE WALKO [id number] Discharge Data: 04/23/2014 11:35 AM Attending Provider: Donne Hazel, MD [id number], MD     [name] to be D/C'd Skilled nursing facility per MD order.  Report given to [facility]. All IV's discontinued with no bleeding noted. All belongings returned to patient for patient to take.  Last Vital Signs:  Blood pressure 138/76, pulse 70, temperature 97.1 F (36.2 C), temperature source Oral, resp. rate 16, height 5\' 10"  (1.778 m), weight 68.221 kg (150 lb 6.4 oz), SpO2 96.00%.  Discharge Medication List   Medication List    STOP taking these medications       FLUoxetine 10 MG capsule  Commonly known as:  PROZAC     lisinopril 5 MG tablet  Commonly known as:  PRINIVIL,ZESTRIL     lithium carbonate 150 MG capsule      TAKE these medications       ALPRAZolam 1 MG tablet  Commonly known as:  XANAX  Take 1 mg by mouth 3 (three) times daily.     ALPRAZolam 1 MG tablet  Commonly known as:  XANAX  Take 1 tablet (1 mg total) by mouth 3 (three) times daily.     clopidogrel 75 MG tablet  Commonly known as:  PLAVIX  Take 75 mg by mouth every morning.     haloperidol 5 MG tablet  Commonly known as:  HALDOL  Take 1 tablet (5 mg total) by mouth every 6 (six) hours as needed for agitation.     metoprolol succinate 25 MG 24 hr tablet  Commonly known as:  TOPROL-XL  Take 25 mg by mouth every morning.     OLANZapine zydis 10 MG disintegrating tablet  Commonly known as:  ZYPREXA  Take 1 tablet (10 mg total) by mouth at bedtime.     simvastatin 10 MG tablet  Commonly known as:  ZOCOR  Take 10 mg by mouth at bedtime.     tamsulosin 0.4 MG Caps capsule  Commonly known as:  FLOMAX  Take 0.4 mg by mouth every morning.     traZODone 100 MG tablet  Commonly known as:  DESYREL  Take 100 mg by mouth at bedtime.         Wallie Renshaw, RN

## 2014-04-23 NOTE — Clinical Social Work Placement (Signed)
Clinical Social Work Department CLINICAL SOCIAL WORK PLACEMENT NOTE 04/23/2014  Patient:  Jeffrey Herring, Jeffrey Herring  Account Number:  [id number] Admit date:  04/09/2014  Clinical Social Worker:  Lovey Newcomer  Date/time:  04/17/2014 11:13 AM  Clinical Social Work is seeking post-discharge placement for this patient at the following level of care:   SKILLED NURSING   (*CSW will update this form in Epic as items are completed)   04/17/2014  Patient/family provided with [facility] Department of Clinical Social Work's list of facilities offering this level of care within the geographic area requested by the patient (or if unable, by the patient's family).  04/17/2014  Patient/family informed of their freedom to choose among providers that offer the needed level of care, that participate in Medicare, Medicaid or managed care program needed by the patient, have an available bed and are willing to accept the patient.  04/17/2014  Patient/family informed of [facility]' ownership interest in [facility], as well as of the fact that they are under no obligation to receive care at this facility.  PASARR submitted to EDS on 04/16/2014 PASARR number received on 04/16/2014  FL2 transmitted to all facilities in geographic area requested by pt/family on  04/17/2014 FL2 transmitted to all facilities within larger geographic area on   Patient informed that his/her managed care company has contracts with or will negotiate with  certain facilities, including the following:     Patient/family informed of bed offers received:  04/22/2014 Patient chooses bed at [facility] Physician recommends and patient chooses bed at    Patient to be transferred to [facility] on  04/23/2014 Patient to be transferred to facility by Ambulance Patient and family notified of transfer on 04/23/2014 Name of family member notified:  [name]  The following physician request  were entered in Epic:   Additional Comments: Per MD patient ready for DC to [facility] SNF. RN, patient's wife, and facility notified of discharge. DC packet on chart. RN given number for report. Ambulance transport requested. RN informed to contact wife when EMS arrives CSW signing off.   Liz Beach MSW, [place], [place], JI:[id number]

## 2014-04-23 NOTE — Progress Notes (Signed)
Report given to Elmyra Ricks, LPN at [facility]

## 2014-04-28 ENCOUNTER — Emergency Department (HOSPITAL_COMMUNITY): Payer: Medicare Other

## 2014-04-28 ENCOUNTER — Observation Stay (HOSPITAL_COMMUNITY): Payer: Medicare Other

## 2014-04-28 ENCOUNTER — Observation Stay (HOSPITAL_COMMUNITY)
Admission: EM | Admit: 2014-04-28 | Discharge: 2014-05-11 | Disposition: A | Discharge status: Home / Self Care | Payer: Medicare Other | Attending: Internal Medicine | Admitting: Internal Medicine

## 2014-04-28 ENCOUNTER — Encounter (HOSPITAL_COMMUNITY): Payer: Self-pay | Admitting: Emergency Medicine

## 2014-04-28 DIAGNOSIS — E860 Dehydration: Secondary | ICD-10-CM

## 2014-04-28 DIAGNOSIS — N289 Disorder of kidney and ureter, unspecified: Secondary | ICD-10-CM | POA: Diagnosis not present

## 2014-04-28 DIAGNOSIS — N183 Chronic kidney disease, stage 3 unspecified: Secondary | ICD-10-CM

## 2014-04-28 DIAGNOSIS — Z950 Presence of cardiac pacemaker: Secondary | ICD-10-CM

## 2014-04-28 DIAGNOSIS — Z87442 Personal history of urinary calculi: Secondary | ICD-10-CM | POA: Insufficient documentation

## 2014-04-28 DIAGNOSIS — E785 Hyperlipidemia, unspecified: Secondary | ICD-10-CM | POA: Diagnosis not present

## 2014-04-28 DIAGNOSIS — F319 Bipolar disorder, unspecified: Secondary | ICD-10-CM | POA: Diagnosis not present

## 2014-04-28 DIAGNOSIS — N179 Acute kidney failure, unspecified: Secondary | ICD-10-CM | POA: Diagnosis not present

## 2014-04-28 DIAGNOSIS — I509 Heart failure, unspecified: Secondary | ICD-10-CM

## 2014-04-28 DIAGNOSIS — E43 Unspecified severe protein-calorie malnutrition: Secondary | ICD-10-CM

## 2014-04-28 DIAGNOSIS — D649 Anemia, unspecified: Secondary | ICD-10-CM | POA: Diagnosis present

## 2014-04-28 DIAGNOSIS — Z86718 Personal history of other venous thrombosis and embolism: Secondary | ICD-10-CM | POA: Insufficient documentation

## 2014-04-28 DIAGNOSIS — F17200 Nicotine dependence, unspecified, uncomplicated: Secondary | ICD-10-CM | POA: Diagnosis not present

## 2014-04-28 DIAGNOSIS — G9389 Other specified disorders of brain: Secondary | ICD-10-CM | POA: Diagnosis not present

## 2014-04-28 DIAGNOSIS — E232 Diabetes insipidus: Secondary | ICD-10-CM | POA: Insufficient documentation

## 2014-04-28 DIAGNOSIS — I5022 Chronic systolic (congestive) heart failure: Secondary | ICD-10-CM | POA: Diagnosis not present

## 2014-04-28 DIAGNOSIS — Z7902 Long term (current) use of antithrombotics/antiplatelets: Secondary | ICD-10-CM | POA: Insufficient documentation

## 2014-04-28 DIAGNOSIS — I499 Cardiac arrhythmia, unspecified: Secondary | ICD-10-CM | POA: Diagnosis not present

## 2014-04-28 DIAGNOSIS — F29 Unspecified psychosis not due to a substance or known physiological condition: Secondary | ICD-10-CM

## 2014-04-28 DIAGNOSIS — F28 Other psychotic disorder not due to a substance or known physiological condition: Secondary | ICD-10-CM

## 2014-04-28 DIAGNOSIS — R4182 Altered mental status, unspecified: Principal | ICD-10-CM

## 2014-04-28 DIAGNOSIS — R0989 Other specified symptoms and signs involving the circulatory and respiratory systems: Secondary | ICD-10-CM | POA: Diagnosis not present

## 2014-04-28 DIAGNOSIS — R451 Restlessness and agitation: Secondary | ICD-10-CM

## 2014-04-28 DIAGNOSIS — Z79899 Other long term (current) drug therapy: Secondary | ICD-10-CM | POA: Insufficient documentation

## 2014-04-28 DIAGNOSIS — Z86711 Personal history of pulmonary embolism: Secondary | ICD-10-CM | POA: Insufficient documentation

## 2014-04-28 HISTORY — DX: Chronic kidney disease, stage 3 unspecified: N18.30

## 2014-04-28 HISTORY — DX: Chronic kidney disease, stage 3 (moderate): N18.3

## 2014-04-28 HISTORY — DX: Toxic effect of other metals, accidental (unintentional), initial encounter: T56.891A

## 2014-04-28 HISTORY — DX: Bipolar disorder, unspecified: F31.9

## 2014-04-28 LAB — COMPREHENSIVE METABOLIC PANEL
ALT: 25 U/L (ref 0–53)
AST: 27 U/L (ref 0–37)
Albumin: 3.6 g/dL (ref 3.5–5.2)
Alkaline Phosphatase: 96 U/L (ref 39–117)
Anion gap: 16 — ABNORMAL HIGH (ref 5–15)
BUN: 25 mg/dL — ABNORMAL HIGH (ref 6–23)
CO2: 20 mEq/L (ref 19–32)
Calcium: 10.1 mg/dL (ref 8.4–10.5)
Chloride: 103 mEq/L (ref 96–112)
Creatinine, Ser: 2.4 mg/dL — ABNORMAL HIGH (ref 0.50–1.35)
GFR calc Af Amer: 31 mL/min — ABNORMAL LOW (ref >90)
GFR calc non Af Amer: 27 mL/min — ABNORMAL LOW (ref >90)
Glucose, Bld: 88 mg/dL (ref 70–99)
Potassium: 4.4 mEq/L (ref 3.7–5.3)
Sodium: 139 mEq/L (ref 137–147)
Total Bilirubin: 0.3 mg/dL (ref 0.3–1.2)
Total Protein: 6.8 g/dL (ref 6.0–8.3)

## 2014-04-28 LAB — CBC WITH DIFFERENTIAL/PLATELET
Basophils Absolute: 0 10*3/uL (ref 0.0–0.1)
Basophils Relative: 0 % (ref 0–1)
Eosinophils Absolute: 0.1 10*3/uL (ref 0.0–0.7)
Eosinophils Relative: 2 % (ref 0–5)
HCT: 33.8 % — ABNORMAL LOW (ref 39.0–52.0)
Hemoglobin: 10.7 g/dL — ABNORMAL LOW (ref 13.0–17.0)
Lymphocytes Relative: 8 % — ABNORMAL LOW (ref 12–46)
Lymphs Abs: 0.5 10*3/uL — ABNORMAL LOW (ref 0.7–4.0)
MCH: 30.7 pg (ref 26.0–34.0)
MCHC: 31.7 g/dL (ref 30.0–36.0)
MCV: 96.8 fL (ref 78.0–100.0)
Monocytes Absolute: 0.6 10*3/uL (ref 0.1–1.0)
Monocytes Relative: 10 % (ref 3–12)
Neutro Abs: 4.7 10*3/uL (ref 1.7–7.7)
Neutrophils Relative %: 80 % — ABNORMAL HIGH (ref 43–77)
Platelets: 179 10*3/uL (ref 150–400)
RBC: 3.49 MIL/uL — ABNORMAL LOW (ref 4.22–5.81)
RDW: 14.9 % (ref 11.5–15.5)
WBC: 5.8 10*3/uL (ref 4.0–10.5)

## 2014-04-28 LAB — URINALYSIS, ROUTINE W REFLEX MICROSCOPIC
Bilirubin Urine: NEGATIVE
Glucose, UA: NEGATIVE mg/dL
Hgb urine dipstick: NEGATIVE
Ketones, ur: NEGATIVE mg/dL
Leukocytes, UA: NEGATIVE
Nitrite: NEGATIVE
Protein, ur: NEGATIVE mg/dL
Specific Gravity, Urine: 1.008 (ref 1.005–1.030)
Urobilinogen, UA: 0.2 mg/dL (ref 0.0–1.0)
pH: 6 (ref 5.0–8.0)

## 2014-04-28 LAB — LITHIUM LEVEL: Lithium Lvl: <0.25 mEq/L — ABNORMAL LOW (ref 0.80–1.40)

## 2014-04-28 MED ORDER — SIMVASTATIN 10 MG PO TABS
10.0000 mg | ORAL_TABLET | Freq: Every day | ORAL | Status: DC
  Administered 2014-04-28 – 2014-05-10 (×13): 10 mg via ORAL
  Filled 2014-04-28 (×15): qty 1

## 2014-04-28 MED ORDER — ONDANSETRON HCL 4 MG PO TABS: 4.0000 mg | ORAL_TABLET | Freq: Four times a day (QID) | ORAL | Status: DC | PRN

## 2014-04-28 MED ORDER — ONDANSETRON HCL 4 MG/2ML IJ SOLN: 4.0000 mg | Freq: Four times a day (QID) | INTRAMUSCULAR | Status: DC | PRN

## 2014-04-28 MED ORDER — SODIUM CHLORIDE 0.9 % IV BOLUS (SEPSIS)
500.0000 mL | Freq: Once | INTRAVENOUS | Status: AC
  Administered 2014-04-28: 500 mL via INTRAVENOUS

## 2014-04-28 MED ORDER — ALPRAZOLAM 1 MG PO TABS
1.0000 mg | ORAL_TABLET | Freq: Three times a day (TID) | ORAL | Status: DC
  Administered 2014-04-28 – 2014-05-06 (×22): 1 mg via ORAL
  Filled 2014-04-28 (×6): qty 2
  Filled 2014-04-28: qty 1
  Filled 2014-04-28 (×16): qty 2

## 2014-04-28 MED ORDER — METOPROLOL SUCCINATE ER 25 MG PO TB24
25.0000 mg | ORAL_TABLET | Freq: Every morning | ORAL | Status: DC
  Administered 2014-04-29 – 2014-05-11 (×12): 25 mg via ORAL
  Filled 2014-04-28 (×14): qty 1

## 2014-04-28 MED ORDER — SODIUM CHLORIDE 0.9 % IV SOLN
INTRAVENOUS | Status: DC
  Administered 2014-04-28 – 2014-04-29 (×2): via INTRAVENOUS

## 2014-04-28 MED ORDER — TRAZODONE HCL 100 MG PO TABS
100.0000 mg | ORAL_TABLET | Freq: Every day | ORAL | Status: DC
  Administered 2014-04-28 – 2014-05-06 (×9): 100 mg via ORAL
  Filled 2014-04-28: qty 1
  Filled 2014-04-28 (×2): qty 2
  Filled 2014-04-28 (×6): qty 1
  Filled 2014-04-28: qty 2
  Filled 2014-04-28 (×3): qty 1

## 2014-04-28 MED ORDER — ACETAMINOPHEN 325 MG PO TABS
650.0000 mg | ORAL_TABLET | Freq: Four times a day (QID) | ORAL | Status: DC | PRN
  Administered 2014-04-29: 650 mg via ORAL
  Filled 2014-04-28: qty 2

## 2014-04-28 MED ORDER — OLANZAPINE 10 MG PO TBDP
10.0000 mg | ORAL_TABLET | Freq: Every day | ORAL | Status: DC
  Administered 2014-04-28 – 2014-05-05 (×7): 10 mg via ORAL
  Filled 2014-04-28 (×12): qty 1

## 2014-04-28 MED ORDER — HEPARIN SODIUM (PORCINE) 5000 UNIT/ML IJ SOLN
5000.0000 [IU] | Freq: Three times a day (TID) | INTRAMUSCULAR | Status: DC
  Administered 2014-04-29 – 2014-05-06 (×4): 5000 [IU] via SUBCUTANEOUS
  Filled 2014-04-28 (×27): qty 1

## 2014-04-28 MED ORDER — ACETAMINOPHEN 650 MG RE SUPP: 650.0000 mg | Freq: Four times a day (QID) | RECTAL | Status: DC | PRN

## 2014-04-28 MED ORDER — HALOPERIDOL 5 MG PO TABS
5.0000 mg | ORAL_TABLET | Freq: Four times a day (QID) | ORAL | Status: DC | PRN
  Administered 2014-04-29 – 2014-05-01 (×3): 5 mg via ORAL
  Filled 2014-04-28 (×3): qty 1

## 2014-04-28 MED ORDER — TAMSULOSIN HCL 0.4 MG PO CAPS
0.4000 mg | ORAL_CAPSULE | Freq: Every morning | ORAL | Status: DC
  Administered 2014-04-29 – 2014-05-11 (×12): 0.4 mg via ORAL
  Filled 2014-04-28 (×13): qty 1

## 2014-04-28 MED ORDER — CLOPIDOGREL BISULFATE 75 MG PO TABS
75.0000 mg | ORAL_TABLET | Freq: Every morning | ORAL | Status: DC
  Administered 2014-04-29 – 2014-05-11 (×12): 75 mg via ORAL
  Filled 2014-04-28 (×13): qty 1

## 2014-04-28 NOTE — ED Notes (Signed)
Bed: WA07 Expected date:  Expected time:  Means of arrival:  Comments: EMS-AMS-off meds

## 2014-04-28 NOTE — ED Notes (Signed)
Pt from [facility].  Per report, pt was recently admitted for pna and was taken off his lithium at that time for toxicity and never put back on it.  Pt called 911 today and stated he was being held against his will.  VSS by PTAR 112/70, 80 reg, 94%RA.

## 2014-04-28 NOTE — Progress Notes (Signed)
[facility] History and Physical  Jeffrey Herring [id number] DOB: [birth year] DOA: 04/28/2014  Referring physician: ED physician PCP: Ashok Norris, MD   Chief Complaint: AMS, delusional  HPI:  Jeffrey Herring is a 63yo man with PMH of Bipolar type 1, CAD, chronic systolic CHF (TTE 123XX123 with EF of 30-35%), complete HB with PM in place, CKD, DI, h/o DVT and PE (not on coumadin currently) who presents with increased confusion and AKI.  Jeffrey Herring was alert, but not able to have a completely coherent conversation when I saw him.  He was recently admitted to the hospital for Li toxicity, abnormal behavior and AKI and was discharged to a SNF.  Today, he called 911 from the SNF stating that they were holding him hostage and he was brought to the ED.  Jeffrey Herring does know that he is at the hospital, but is very tangential in thinking and reports that he has "somewhere" to be and cannot stay in the hospital.  I called and spoke with his wife, Jeffrey Herring, who noted that Jeffrey Herring has struggled with Bipolar disorder.  About 10 years ago, he had similar issues and was admitted to a psychiatric hospital and started on Lithium with good results. However, about 6 weeks ago, he began having bizarre and abnormal behavior again and was admitted with Nicoletta Dress toxicity.  He is now on Zyprexa for his Bipolar disorder.    Labs in the ED showed a mild worsening of his CKD to 2.4 (baseline 2) along with some likely dehydration with hemoconcentration on H/H.  He was placed under IVC by the ED provider.    Assessment and Plan:  AKI (acute kidney injury) - Cr up to 2.4 with BUN of 25 - BUN/Cr ratio is 10 - Will get renal US to assess for post renal disease given history of retention.  - IVF with NS, 75cc/hr for 10 hours given heart failure history - BMET in the AM  Bipolar 1 disorder with AMS - Will need psychiatry consult in AM for further medication adjustment - There was some discussion of adding Lamictal to  regimen by Dr. Louretta Shorten at last hospitalization - IVC in place - PRN haldol and xanax - CT head without acute issue  Cardiac pacemaker in situ due to complete heart block - Monitor heart rate with vitals - Continue metoprolol    Chronic systolic CHF (congestive heart failure), and likely CAD - No overt signs of heart failure at this time - Fluids at a low rate with stop time ordered to avoid volume overload - Continue plavix, metoprolol, simvastatin  BPH with h/o urinary retention - Continue tamsulosin    Radiological Exams on Admission: Ct Head Wo Contrast  04/28/2014   CLINICAL DATA:  Altered mental status, agitation and trouble following commands  EXAM: CT HEAD WITHOUT CONTRAST  TECHNIQUE: Contiguous axial images were obtained from the base of the skull through the vertex without intravenous contrast.  COMPARISON:  Prior head CT 04/14/2014  FINDINGS: Negative for acute intracranial hemorrhage, acute infarction, mass, mass effect, hydrocephalus or midline shift. Gray-white differentiation is preserved throughout. Stable right posterior parietal / occipital encephalomalacia. Global cerebral volume loss slightly advanced for age. No acute soft tissue or calvarial abnormality. Trace atherosclerotic calcifications in the bilateral cavernous carotid arteries. Visualized mastoid air cells and paranasal sinuses are clear and well aerated.  IMPRESSION: 1. No acute intracranial process. 2. Encephalomalacia in the right posterior parietal/occipital lobe consistent with sequelae of a remote prior infarct.   Electronically  Signed   By: Jacqulynn Cadet M.D.   On: 04/28/2014 13:24   Dg Chest Port 1 View  04/28/2014   CLINICAL DATA:  Altered mental status.  EXAM: PORTABLE CHEST - 1 VIEW  COMPARISON:  Portable chest x-ray of April 11, 2014  FINDINGS: The lungs are adequately inflated. There is no focal infiltrate. There is linear density inferiorly on the right which may reflect scarring or atelectasis  and is not new. The heart is top-normal in size. The patient has undergone previous median sternotomy. The pulmonary vascularity is normal. There is no pleural effusion.  IMPRESSION: There is no acute cardiopulmonary abnormality. Coarse lung markings in the infrahilar region on the right likely reflect scarring or subsegmental atelectasis.   Electronically Signed   By: David  Martinique   On: 04/28/2014 12:31     Code Status: Full Family Communication: Pt at bedside, wife Jeffrey Herring on the phone.  Confirmed code status with Jeffrey Herring.  Disposition Plan: Admit for further evaluation     Review of Systems:  Unable to obtain due to patient mental status   Per chart review, unable to confirm with patient.  Past Medical History  Diagnosis Date  . Hyperlipemia   . Pacemaker   . Arrhythmia   . Diabetes insipidus   . History of kidney stones   . History of DVT (deep vein thrombosis)   . History of pulmonary embolism   . Bipolar 1 disorder   . Lithium toxicity     Past Surgical History  Procedure Laterality Date  . Cardiac surgery    . Spine surgery    . Nose surgery    . Pacemaker placement    . Hernia repair Right 02/05/14    Social History:  reports that he has been smoking Cigars.  He has never used smokeless tobacco. He reports that he does not drink alcohol or use illicit drugs.  Allergies  Allergen Reactions  . Aspirin Hives  . Codeine Nausea And Vomiting  . Demerol [Meperidine] Nausea And Vomiting    Family History  Problem Relation Age of Onset  . Depression Mother     Prior to Admission medications   Medication Sig Start Date End Date Taking? Authorizing Provider  ALPRAZolam [name]) 1 MG tablet Take 1 mg by mouth 3 (three) times daily.   Yes Historical Provider, MD  clopidogrel (PLAVIX) 75 MG tablet Take 75 mg by mouth every morning.    Yes Historical Provider, MD  haloperidol (HALDOL) 5 MG tablet Take 1 tablet (5 mg total) by mouth every 6 (six) hours as needed for  agitation. 04/22/14  Yes Donne Hazel, MD  metoprolol succinate (TOPROL-XL) 25 MG 24 hr tablet Take 25 mg by mouth every morning.    Yes Historical Provider, MD  OLANZapine zydis (ZYPREXA) 10 MG disintegrating tablet Take 1 tablet (10 mg total) by mouth at bedtime. 04/22/14  Yes Donne Hazel, MD  simvastatin (ZOCOR) 10 MG tablet Take 10 mg by mouth at bedtime.   Yes Historical Provider, MD  tamsulosin (FLOMAX) 0.4 MG CAPS Take 0.4 mg by mouth every morning.    Yes Historical Provider, MD  traZODone (DESYREL) 100 MG tablet Take 100 mg by mouth at bedtime.   Yes Historical Provider, MD    Physical Exam: Filed Vitals:   04/28/14 1117 04/28/14 1357 04/28/14 1613  BP: 118/65 100/56 142/76  Pulse: 78 60 72  Temp: 98.2 F (36.8 C)  97.9 F (36.6 C)  TempSrc: Oral  Oral  Resp: 18 16 18   SpO2: 95% 96% 96%    Physical Exam  Constitutional: Thin gentleman, in street clothes, NAD HENT: Normocephalic. MMM Eyes: Conjunctivae normal, no scleral icterus Neck: Neck Supple CVS: RRR, S1/S2 +, no murmurs Pulmonary: Effort and breath sounds normal, wheezes, rales.  Abdominal: Soft. BS +,  no distension, tenderness  Musculoskeletal:No edema and no tenderness.  Neuro: Alert. Normal muscle tone. Strength 5/5 in upper and lower extremities Skin: Skin is warm and dry. No rash noted. Not diaphoretic. No erythema. No pallor.  Psychiatric: patient with tangential thinking, delusions, he is unclear that he came from SNF   Labs on Admission:  Basic Metabolic Panel:  Recent Labs Lab 04/22/14 0606 04/28/14 1206  NA 145 139  K 4.0 4.4  CL 109 103  CO2 23 20  GLUCOSE 95 88  BUN 13 25*  CREATININE 2.00* 2.40*  CALCIUM 9.1 10.1   Liver Function Tests:  Recent Labs Lab 04/28/14 1206  AST 27  ALT 25  ALKPHOS 96  BILITOT 0.3  PROT 6.8  ALBUMIN 3.6   CBC:  Recent Labs Lab 04/22/14 0606 04/28/14 1206  WBC 3.9* 5.8  NEUTROABS  --  4.7  HGB 8.9* 10.7*  HCT 29.4* 33.8*  MCV 100.3* 96.8   PLT 133* 179    EKG: no new  Gilles Chiquito, MD  [facility] Pager [phone]  If 7PM-7AM, please contact night-coverage [email or web address] Password [facility] 04/28/2014, 5:04 PM

## 2014-04-28 NOTE — ED Provider Notes (Signed)
CSN: [id number]     Arrival date & time 04/28/14  1116 History   First MD Initiated Contact with Patient 04/28/14 1133     Chief Complaint  Patient presents with  . Altered Mental Status   Level V caveat due to altered mental status  (Consider location/radiation/quality/duration/timing/severity/associated sxs/prior Treatment) Patient is a 63 y.o. male presenting with altered mental status. The history is provided by the patient.  Altered Mental Status  patient with altered mental status. Reportedly had called 911 because he was being held against his will. He denies this now. He is somewhat sleepy. It is still somewhat confused. Recent admission with encephalopathy and was lithium toxic at that time.  Past Medical History  Diagnosis Date  . Hyperlipemia   . Pacemaker   . Arrhythmia   . Diabetes insipidus   . History of kidney stones   . History of DVT (deep vein thrombosis)   . History of pulmonary embolism   . Bipolar 1 disorder   . Lithium toxicity    Past Surgical History  Procedure Laterality Date  . Cardiac surgery    . Spine surgery    . Nose surgery    . Pacemaker placement    . Hernia repair Right 02/05/14   Family History  Problem Relation Age of Onset  . Depression Mother    History  Substance Use Topics  . Smoking status: Current Every Day Smoker -- 0.20 packs/day    Types: Cigars  . Smokeless tobacco: Never Used  . Alcohol Use: No    Review of Systems  Unable to perform ROS     Allergies  Aspirin; Codeine; and Demerol  Home Medications   Prior to Admission medications   Medication Sig Start Date End Date Taking? Authorizing Provider  ALPRAZolam [name]) 1 MG tablet Take 1 mg by mouth 3 (three) times daily.   Yes Historical Provider, MD  clopidogrel (PLAVIX) 75 MG tablet Take 75 mg by mouth every morning.    Yes Historical Provider, MD  haloperidol (HALDOL) 5 MG tablet Take 1 tablet (5 mg total) by mouth every 6 (six) hours as needed for  agitation. 04/22/14  Yes Donne Hazel, MD  metoprolol succinate (TOPROL-XL) 25 MG 24 hr tablet Take 25 mg by mouth every morning.    Yes Historical Provider, MD  OLANZapine zydis (ZYPREXA) 10 MG disintegrating tablet Take 1 tablet (10 mg total) by mouth at bedtime. 04/22/14  Yes Donne Hazel, MD  simvastatin (ZOCOR) 10 MG tablet Take 10 mg by mouth at bedtime.   Yes Historical Provider, MD  tamsulosin (FLOMAX) 0.4 MG CAPS Take 0.4 mg by mouth every morning.    Yes Historical Provider, MD  traZODone (DESYREL) 100 MG tablet Take 100 mg by mouth at bedtime.   Yes Historical Provider, MD   BP 100/56  Pulse 60  Temp(Src) 98.2 F (36.8 C) (Oral)  Resp 16  SpO2 96% Physical Exam  Constitutional: He appears well-developed and well-nourished.  HENT:  Head: Normocephalic and atraumatic.  Eyes: EOM are normal. Pupils are equal, round, and reactive to light.  Neck: Normal range of motion. Neck supple.  Cardiovascular: Normal rate.   Pulmonary/Chest: Effort normal and breath sounds normal.  Abdominal: Soft.  Neurological: He is alert.  Patient is awake answers questions, but he is somewhat sleepy. He moves his extremities. He appears mildly confused. He is a somewhat poor historian.  Skin: Skin is warm and dry.    ED Course  Procedures (including  critical care time) Labs Review Labs Reviewed  CBC WITH DIFFERENTIAL - Abnormal; Notable for the following:    RBC 3.49 (*)    Hemoglobin 10.7 (*)    HCT 33.8 (*)    Neutrophils Relative % 80 (*)    Lymphocytes Relative 8 (*)    Lymphs Abs 0.5 (*)    All other components within normal limits  COMPREHENSIVE METABOLIC PANEL - Abnormal; Notable for the following:    BUN 25 (*)    Creatinine, Ser 2.40 (*)    GFR calc non Af Amer 27 (*)    GFR calc Af Amer 31 (*)    Anion gap 16 (*)    All other components within normal limits  LITHIUM LEVEL - Abnormal; Notable for the following:    Lithium Lvl <0.25 (*)    All other components within normal  limits  URINALYSIS, ROUTINE W REFLEX MICROSCOPIC    Imaging Review Ct Head Wo Contrast  04/28/2014   CLINICAL DATA:  Altered mental status, agitation and trouble following commands  EXAM: CT HEAD WITHOUT CONTRAST  TECHNIQUE: Contiguous axial images were obtained from the base of the skull through the vertex without intravenous contrast.  COMPARISON:  Prior head CT 04/14/2014  FINDINGS: Negative for acute intracranial hemorrhage, acute infarction, mass, mass effect, hydrocephalus or midline shift. Gray-white differentiation is preserved throughout. Stable right posterior parietal / occipital encephalomalacia. Global cerebral volume loss slightly advanced for age. No acute soft tissue or calvarial abnormality. Trace atherosclerotic calcifications in the bilateral cavernous carotid arteries. Visualized mastoid air cells and paranasal sinuses are clear and well aerated.  IMPRESSION: 1. No acute intracranial process. 2. Encephalomalacia in the right posterior parietal/occipital lobe consistent with sequelae of a remote prior infarct.   Electronically Signed   By: Jacqulynn Cadet M.D.   On: 04/28/2014 13:24   Dg Chest Port 1 View  04/28/2014   CLINICAL DATA:  Altered mental status.  EXAM: PORTABLE CHEST - 1 VIEW  COMPARISON:  Portable chest x-ray of April 11, 2014  FINDINGS: The lungs are adequately inflated. There is no focal infiltrate. There is linear density inferiorly on the right which may reflect scarring or atelectasis and is not new. The heart is top-normal in size. The patient has undergone previous median sternotomy. The pulmonary vascularity is normal. There is no pleural effusion.  IMPRESSION: There is no acute cardiopulmonary abnormality. Coarse lung markings in the infrahilar region on the right likely reflect scarring or subsegmental atelectasis.   Electronically Signed   By: David  Martinique   On: 04/28/2014 12:31     EKG Interpretation None      MDM   Final diagnoses:  Altered mental  status, unspecified altered mental status type  Dehydration  Renal insufficiency    Patient with altered metal status. History same. Creatinine has increased. May be due to some dehydration and patient is also off his lithium. Patient has been wandering in the ER. Will admit to internal medicine    Jasper Riling. Alvino Chapel, MD 04/28/14 1551

## 2014-04-28 NOTE — ED Notes (Signed)
Pt found up in his room, getting dressed, unoriented.  Remains calm and cooperative but confused.  Assisted back to bed and provided distractions for him:  TV, soda to drink, call bell.

## 2014-04-29 ENCOUNTER — Encounter (HOSPITAL_COMMUNITY): Payer: Self-pay | Admitting: Internal Medicine

## 2014-04-29 DIAGNOSIS — F29 Unspecified psychosis not due to a substance or known physiological condition: Secondary | ICD-10-CM

## 2014-04-29 DIAGNOSIS — D649 Anemia, unspecified: Secondary | ICD-10-CM | POA: Diagnosis present

## 2014-04-29 DIAGNOSIS — F319 Bipolar disorder, unspecified: Secondary | ICD-10-CM | POA: Diagnosis not present

## 2014-04-29 DIAGNOSIS — F3160 Bipolar disorder, current episode mixed, unspecified: Secondary | ICD-10-CM

## 2014-04-29 DIAGNOSIS — I5022 Chronic systolic (congestive) heart failure: Secondary | ICD-10-CM | POA: Diagnosis not present

## 2014-04-29 DIAGNOSIS — N183 Chronic kidney disease, stage 3 unspecified: Secondary | ICD-10-CM

## 2014-04-29 DIAGNOSIS — R4182 Altered mental status, unspecified: Secondary | ICD-10-CM | POA: Diagnosis not present

## 2014-04-29 LAB — TSH: TSH: 0.644 u[IU]/mL (ref 0.350–4.500)

## 2014-04-29 LAB — BASIC METABOLIC PANEL
Anion gap: 13 (ref 5–15)
BUN: 23 mg/dL (ref 6–23)
CO2: 18 mEq/L — ABNORMAL LOW (ref 19–32)
Calcium: 9.5 mg/dL (ref 8.4–10.5)
Chloride: 108 mEq/L (ref 96–112)
Creatinine, Ser: 2.28 mg/dL — ABNORMAL HIGH (ref 0.50–1.35)
GFR calc Af Amer: 33 mL/min — ABNORMAL LOW (ref >90)
GFR calc non Af Amer: 29 mL/min — ABNORMAL LOW (ref >90)
Glucose, Bld: 95 mg/dL (ref 70–99)
Potassium: 4.4 mEq/L (ref 3.7–5.3)
Sodium: 139 mEq/L (ref 137–147)

## 2014-04-29 LAB — CBC
HCT: 31.5 % — ABNORMAL LOW (ref 39.0–52.0)
Hemoglobin: 9.9 g/dL — ABNORMAL LOW (ref 13.0–17.0)
MCH: 30.8 pg (ref 26.0–34.0)
MCHC: 31.4 g/dL (ref 30.0–36.0)
MCV: 98.1 fL (ref 78.0–100.0)
Platelets: 191 10*3/uL (ref 150–400)
RBC: 3.21 MIL/uL — ABNORMAL LOW (ref 4.22–5.81)
RDW: 14.9 % (ref 11.5–15.5)
WBC: 4.6 10*3/uL (ref 4.0–10.5)

## 2014-04-29 LAB — VITAMIN B12: Vitamin B-12: 444 pg/mL (ref 211–911)

## 2014-04-29 MED ORDER — LAMOTRIGINE 25 MG PO TABS
25.0000 mg | ORAL_TABLET | Freq: Two times a day (BID) | ORAL | Status: DC
  Administered 2014-04-29 – 2014-04-30 (×3): 25 mg via ORAL
  Filled 2014-04-29 (×5): qty 1

## 2014-04-29 MED ORDER — LAMOTRIGINE 5 MG PO CHEW
25.0000 mg | CHEWABLE_TABLET | Freq: Two times a day (BID) | ORAL | Status: DC
  Filled 2014-04-29: qty 5

## 2014-04-29 MED ORDER — HALOPERIDOL LACTATE 5 MG/ML IJ SOLN
5.0000 mg | Freq: Once | INTRAMUSCULAR | Status: AC
  Administered 2014-04-29: 5 mg via INTRAMUSCULAR

## 2014-04-29 MED ORDER — OLANZAPINE 5 MG PO TBDP
5.0000 mg | ORAL_TABLET | Freq: Every morning | ORAL | Status: DC
  Administered 2014-04-30 – 2014-05-06 (×6): 5 mg via ORAL
  Filled 2014-04-29 (×7): qty 1

## 2014-04-29 NOTE — Progress Notes (Signed)
Clinical Social Work Department CLINICAL SOCIAL WORK PSYCHIATRY SERVICE LINE ASSESSMENT 04/29/2014  Patient:  Jeffrey Herring [facility]  Account:  [id number]  [place] Date:  04/28/2014  Clinical Social Worker:  Sindy Messing, LCSW  Date/Time:  04/29/2014 11:45 AM Referred by:  Physician  Date referred:  04/29/2014 Reason for Referral  Psychosocial assessment   Presenting Symptoms/Problems (In the person's/family's own words):   Psych consulted due to bipolar disorder.   Abuse/Neglect/Trauma History (check all that apply)  Denies history   Abuse/Neglect/Trauma Comments:   Psychiatric History (check all that apply)  Outpatient treatment  Inpatient/hospitilization   Psychiatric medications:  Xanax 1 mg  Zyprexa 10 mg  Trazodone 100 mg   Current Mental Health Hospitalizations/Previous Mental Health History:   Patient was diagnosed with bipolar disorder about 14 years ago. Patient has received medication management but has had some recent medication changes.   Current provider:   Dr. Thom Chimes and Date:   [place] outpatient   Current Medications:   Scheduled Meds:      . ALPRAZolam  1 mg Oral TID  . clopidogrel  75 mg Oral q morning - 10a  . heparin  5,000 Units Subcutaneous 3 times per day  . metoprolol succinate  25 mg Oral q morning - 10a  . OLANZapine zydis  10 mg Oral QHS  . simvastatin  10 mg Oral QHS  . tamsulosin  0.4 mg Oral q morning - 10a  . traZODone  100 mg Oral QHS        Continuous Infusions:      . sodium chloride 75 mL/hr at 04/28/14 1815          PRN Meds:.acetaminophen, acetaminophen, haloperidol, ondansetron (ZOFRAN) IV, ondansetron       Previous Impatient Admission/Date/Reason:   Patient was admitted to [facility] about 10 years ago.   Emotional Health / Current Symptoms    Suicide/Self Harm  None reported   Suicide attempt in the past:   No previous attempts. Unable to assess patient at this time to assess for any current SI or HI.   Other harmful  behavior:   None reported   Psychotic/Dissociative Symptoms  Delusional   Other Psychotic/Dissociative Symptoms:   Per wife, patient has been confused and delusional. Wife gives the example of patient calling 911 when he was at the facility.    Attention/Behavioral Symptoms  Unable to accurately assess   Other Attention / Behavioral Symptoms:   Patient sleeping and will not awake for assessment.    Cognitive Impairment  Unable to accurately assess   Other Cognitive Impairment:   Patient sleeping and will not awake for assessment.    Mood and Adjustment  Unable to accurately assess    Stress, Anxiety, Trauma, Any Recent Loss/Stressor  None reported   Anxiety (frequency):   N/A   Phobia (specify):   N/A   Compulsive behavior (specify):   N/A   Obsessive behavior (specify):   N/A   Other:   N/A   Substance Abuse/Use  None   SBIRT completed (please refer for detailed history):  N  Self-reported substance use:   Wife reports no substance use.   Urinary Drug Screen Completed:  N Alcohol level:   N/A    Environmental/Housing/Living Arrangement  SKILLED NURSING FACILITY   Who is in the home:   [facility]   Emergency contact:  Mary Ann-wife   Financial  Medicare   Patient's Strengths and Goals (patient's own words):  Patient has supportive family.   Clinical Social Worker's Interpretive Summary:   CSW received referral in order to complete psychosocial assessment. CSW reviewed chart and attempted to meet with patient. Patient sleeping with sitter present. Sitter reports patient has been confused and not following directions when awake. CSW will follow up at later time to complete assessment of patient.    CSW called and spoke with wife via phone. Wife reports she is tired and decided to stay at home today. CSW reassured wife that hospital will provide good care for patient. Wife reports that she and patient lived in [place] until about 14  years ago. 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 Patient had heart problems and needed his pacemaker replaced. Patient would schedule appointments to replace pacemaker and then would run out of the hospital prior to procedure. This took place three times before cardiologist said that patient would have to be cleared by psychiatrist prior to surgery. Patient was evaluated and diagnosed with bipolar disorder.    Patient and wife moved to [facility] and patient sees Dr. Adele Schilder at [facility] for medication management. Patient has managed symptoms well but was hospitalized about 10 years ago due to delusions and confusion. Wife reports that patient has no other medical or MH diagnosis but does follow MD due to kidney concerns. Wife reports there have been questions about possible stroke but unsure when it occurred. Wife reports that patient went to SNF about 1 week ago for rehab but she thought that patient was getting better. Wife reports that same night is when patient called 911. Patient's medications have been adjusted due to lithium levels affecting his kidney function. About 1 year ago, MD changed Lithium from 3 times a day to 2 times a day because of kidney problems. Wife is unsure of current medications but would like kidney function to be evaluated again.    CSW called SNF and left a message with admissions in order to determine patient's behavior at SNF. CSW will continue to follow and will assist with any recommendations provided by psych MD.   Disposition:  Recommend Psych CSW continuing to support while in hospital   [place], [place] [phone]

## 2014-04-29 NOTE — Progress Notes (Signed)
Clinical Social Work Department BRIEF PSYCHOSOCIAL ASSESSMENT 04/29/2014  Patient:  Jeffrey Herring, Jeffrey Herring     Account Number:  [id number]     Admit date:  04/28/2014  Clinical Social Worker:  Ulyess Blossom  Date/Time:  04/29/2014 11:00 AM  Referred by:  Physician  Date Referred:  04/29/2014 Referred for  SNF Placement   Other Referral:   Interview type:  Family Other interview type:    PSYCHOSOCIAL DATA Living Status:  FACILITY Admitted from facility:  [facility] Level of care:  [place] Primary support name:  Jeffrey Herring/wife/[phone] Primary support relationship to patient:  SPOUSE Degree of support available:   adequate    CURRENT CONCERNS Current Concerns  Post-Acute Placement   Other Concerns:    SOCIAL WORK ASSESSMENT / PLAN CSW received referral that pt admitted from [facility].    CSW reviewed chart and visited pt room. Pt sleeping at this time. No family present at bedside. Safety sitter at bedside. CSW contacted pt wife, Jeffrey Addison via telephone. CSW introduced self and explained role. Pt wife confirmed that pt is a resident at [facility]. CSW provided supportive listening as pt wife discussed that pt had just transitioned to [facility] on 7/9. Pt wife expressed that she has been satisfied with facility thus far and plans for pt to return when medically ready for discharge.    CSW completed FL2 and sent clinicals to [facility].    CSW spoke with [facility] admissions coordinator who confirmed pt can return when medically stable. Per [facility], pt must be without sitter for 24 hours prior to pt transition back to SNF. CSW notified charge RN.    CSW to continue to follow and facilitate pt discharge needs back to [facility] when pt medically ready for discharge.   Assessment/plan status:  Psychosocial Support/Ongoing Assessment of  Needs Other assessment/ plan:   discharge planning   Information/referral to community resources:   Referral back to [facility].    PATIENT'S/FAMILY'S RESPONSE TO PLAN OF CARE: Pt sleeping at this time and per chart oriented to person only. Pt wife supportive and actively involved in pt care. Pt wife hopeful that pt would have been able to get more acquainted to [facility] before pt readmitted to the hospital, but agreeable to pt return to [facility] when medically stable.    Jeffrey Herring, MSW, [place] [facility] [phone]

## 2014-04-29 NOTE — Consult Note (Signed)
[facility] Psychiatry Consult   Reason for Consult:  Bipolar disorder Referring Physician:  Jacquelynn Cree, MD JAMEEK BRUNTZ is an 63 y.o. male. Total Time spent with patient: 45 minutes  Assessment: AXIS I:  Bipolar, mixed AXIS II:  Deferred AXIS III:   Past Medical History  Diagnosis Date  . Hyperlipemia   . Pacemaker   . Arrhythmia   . Diabetes insipidus   . History of kidney stones   . History of DVT (deep vein thrombosis)   . History of pulmonary embolism   . Bipolar 1 disorder   . Lithium toxicity   . Stage III chronic kidney disease    AXIS IV:  other psychosocial or environmental problems, problems related to social environment and problems with primary support group AXIS V:  31-40 impairment in reality testing  Plan:  Case will be discussed with Sindy Messing, MD Recommend psychiatric Inpatient admission when medically cleared. Supportive therapy provided about ongoing stressors. Continue Zyprexa Zydis 10 mg at bedtime and 5 mg daily morning starting tomorrow, we'll start Lamictal chewable 25 mg twice daily for controlling bipolar mania Monitor for side effects of the medications Psychiatric consultation followup as clinically required Appreciate psychiatric consultation Please contact [phone] if needs further assistance  Subjective:   MARVON SHILLINGBURG is a 63 y.o. male patient admitted with Bipolar disorder.  HPI:  Mr. Nehring is a 63 yo male  known to this provider from his recent admission to [facility] with lithium toxicity and delirium/altered mental status. Patient was admitted to [facility] with confusion and manic symptoms. Patient has AKI and not recommended restarting lithium treatment. Patient has been irritable, angry and grandiose during this evaluation.  Medical history: Patient with PMH of Bipolar type 1, CAD, chronic systolic CHF (TTE 01/5363 with EF of 30-35%), complete HB with PM in place, CKD, DI, h/o DVT and PE  (not on coumadin currently) who presents with increased confusion and AKI. Mr. Sanna was alert, but not able to have a completely coherent conversation when I saw him. He was recently admitted to the hospital for Li toxicity, abnormal behavior and AKI and was discharged to a SNF. Today, he called 911 from the SNF stating that they were holding him hostage and he was brought to the ED. Mr. Dolinger does know that he is at the hospital, but is very tangential in thinking and reports that he has "somewhere" to be and cannot stay in the hospital. I called and spoke with his wife, [name], who noted that Mr. Belland has struggled with Bipolar disorder. About 10 years ago, he had similar issues and was admitted to a psychiatric hospital and started on Lithium with good results. However, about 6 weeks ago, he began having bizarre and abnormal behavior again and was admitted with Nicoletta Dress toxicity. He is now on Zyprexa for his Bipolar disorder. Labs in the ED showed a mild worsening of his CKD to 2.4 (baseline 2) along with some likely dehydration with hemoconcentration on H/H. He was placed under IVC by the ED provider.   HPI Elements:   Location:  Bipolar mixed mania. Quality:  Poor. Severity:  Moderate. Timing:  Unknown stressors.  Past Psychiatric History: Past Medical History  Diagnosis Date  . Hyperlipemia   . Pacemaker   . Arrhythmia   . Diabetes insipidus   . History of kidney stones   . History of DVT (deep vein thrombosis)   . History of pulmonary embolism   .  Bipolar 1 disorder   . Lithium toxicity   . Stage III chronic kidney disease     reports that he has been smoking Cigars.  He has never used smokeless tobacco. He reports that he does not drink alcohol or use illicit drugs. Family History  Problem Relation Age of Onset  . Depression Mother          Abuse/Neglect [facility]) Physical Abuse: Denies Verbal Abuse: Denies Sexual Abuse: Denies Allergies:   Allergies  Allergen Reactions  .  Aspirin Hives  . Codeine Nausea And Vomiting  . Demerol [Meperidine] Nausea And Vomiting    ACT Assessment Complete:  NO Objective: Blood pressure 130/68, pulse 63, temperature 97.7 F (36.5 C), temperature source Oral, resp. rate 18, height '5\' 11"'  (1.803 m), weight 68.947 kg (152 lb), SpO2 97.00%.Body mass index is 21.21 kg/(m^2). Results for orders placed during the hospital encounter of 04/28/14 (from the past 72 hour(s))  CBC WITH DIFFERENTIAL     Status: Abnormal   Collection Time    04/28/14 12:06 PM      Result Value Ref Range   WBC 5.8  4.0 - 10.5 K/uL   RBC 3.49 (*) 4.22 - 5.81 MIL/uL   Hemoglobin 10.7 (*) 13.0 - 17.0 g/dL   HCT 33.8 (*) 39.0 - 52.0 %   MCV 96.8  78.0 - 100.0 fL   MCH 30.7  26.0 - 34.0 pg   MCHC 31.7  30.0 - 36.0 g/dL   RDW 14.9  11.5 - 15.5 %   Platelets 179  150 - 400 K/uL   Neutrophils Relative % 80 (*) 43 - 77 %   Neutro Abs 4.7  1.7 - 7.7 K/uL   Lymphocytes Relative 8 (*) 12 - 46 %   Lymphs Abs 0.5 (*) 0.7 - 4.0 K/uL   Monocytes Relative 10  3 - 12 %   Monocytes Absolute 0.6  0.1 - 1.0 K/uL   Eosinophils Relative 2  0 - 5 %   Eosinophils Absolute 0.1  0.0 - 0.7 K/uL   Basophils Relative 0  0 - 1 %   Basophils Absolute 0.0  0.0 - 0.1 K/uL  COMPREHENSIVE METABOLIC PANEL     Status: Abnormal   Collection Time    04/28/14 12:06 PM      Result Value Ref Range   Sodium 139  137 - 147 mEq/L   Potassium 4.4  3.7 - 5.3 mEq/L   Chloride 103  96 - 112 mEq/L   CO2 20  19 - 32 mEq/L   Glucose, Bld 88  70 - 99 mg/dL   BUN 25 (*) 6 - 23 mg/dL   Creatinine, Ser 2.40 (*) 0.50 - 1.35 mg/dL   Calcium 10.1  8.4 - 10.5 mg/dL   Total Protein 6.8  6.0 - 8.3 g/dL   Albumin 3.6  3.5 - 5.2 g/dL   AST 27  0 - 37 U/L   ALT 25  0 - 53 U/L   Alkaline Phosphatase 96  39 - 117 U/L   Total Bilirubin 0.3  0.3 - 1.2 mg/dL   GFR calc non Af Amer 27 (*) >90 mL/min   GFR calc Af Amer 31 (*) >90 mL/min   Comment: (NOTE)     The eGFR has been calculated using the CKD EPI  equation.     This calculation has not been validated in all clinical situations.     eGFR's persistently <90 mL/min signify possible Chronic Kidney  Disease.   Anion gap 16 (*) 5 - 15  LITHIUM LEVEL     Status: Abnormal   Collection Time    04/28/14 12:06 PM      Result Value Ref Range   Lithium Lvl <0.25 (*) 0.80 - 1.40 mEq/L  URINALYSIS, ROUTINE W REFLEX MICROSCOPIC     Status: None   Collection Time    04/28/14 12:24 PM      Result Value Ref Range   Color, Urine YELLOW  YELLOW   APPearance CLEAR  CLEAR   Specific Gravity, Urine 1.008  1.005 - 1.030   pH 6.0  5.0 - 8.0   Glucose, UA NEGATIVE  NEGATIVE mg/dL   Hgb urine dipstick NEGATIVE  NEGATIVE   Bilirubin Urine NEGATIVE  NEGATIVE   Ketones, ur NEGATIVE  NEGATIVE mg/dL   Protein, ur NEGATIVE  NEGATIVE mg/dL   Urobilinogen, UA 0.2  0.0 - 1.0 mg/dL   Nitrite NEGATIVE  NEGATIVE   Leukocytes, UA NEGATIVE  NEGATIVE   Comment: MICROSCOPIC NOT DONE ON URINES WITH NEGATIVE PROTEIN, BLOOD, LEUKOCYTES, NITRITE, OR GLUCOSE <1000 mg/dL.  BASIC METABOLIC PANEL     Status: Abnormal   Collection Time    04/29/14  4:35 AM      Result Value Ref Range   Sodium 139  137 - 147 mEq/L   Potassium 4.4  3.7 - 5.3 mEq/L   Chloride 108  96 - 112 mEq/L   CO2 18 (*) 19 - 32 mEq/L   Glucose, Bld 95  70 - 99 mg/dL   BUN 23  6 - 23 mg/dL   Creatinine, Ser 2.28 (*) 0.50 - 1.35 mg/dL   Calcium 9.5  8.4 - 10.5 mg/dL   GFR calc non Af Amer 29 (*) >90 mL/min   GFR calc Af Amer 33 (*) >90 mL/min   Comment: (NOTE)     The eGFR has been calculated using the CKD EPI equation.     This calculation has not been validated in all clinical situations.     eGFR's persistently <90 mL/min signify possible Chronic Kidney     Disease.   Anion gap 13  5 - 15  CBC     Status: Abnormal   Collection Time    04/29/14  4:35 AM      Result Value Ref Range   WBC 4.6  4.0 - 10.5 K/uL   RBC 3.21 (*) 4.22 - 5.81 MIL/uL   Hemoglobin 9.9 (*) 13.0 - 17.0 g/dL   HCT  31.5 (*) 39.0 - 52.0 %   MCV 98.1  78.0 - 100.0 fL   MCH 30.8  26.0 - 34.0 pg   MCHC 31.4  30.0 - 36.0 g/dL   RDW 14.9  11.5 - 15.5 %   Platelets 191  150 - 400 K/uL  TSH     Status: None   Collection Time    04/29/14 10:30 AM      Result Value Ref Range   TSH 0.644  0.350 - 4.500 uIU/mL   Comment: Performed at [facility] are reviewed and are pertinent for .  Current Facility-Administered Medications  Medication Dose Route Frequency Provider Last Rate Last Dose  . 0.9 %  sodium chloride infusion   Intravenous Continuous Sid Falcon, MD 75 mL/hr at 04/28/14 1815    . acetaminophen (TYLENOL) tablet 650 mg  650 mg Oral Q6H PRN Sid Falcon, MD   650 mg at 04/29/14 [phone]  Or  . acetaminophen (TYLENOL) suppository 650 mg  650 mg Rectal Q6H PRN Sid Falcon, MD      . ALPRAZolam [name]) tablet 1 mg  1 mg Oral TID Sid Falcon, MD   1 mg at 04/29/14 1016  . clopidogrel (PLAVIX) tablet 75 mg  75 mg Oral q morning - 10a Sid Falcon, MD   75 mg at 04/29/14 1016  . haloperidol (HALDOL) tablet 5 mg  5 mg Oral Q6H PRN Sid Falcon, MD   5 mg at 04/29/14 0051  . heparin injection 5,000 Units  5,000 Units Subcutaneous 3 times per day Sid Falcon, MD      . metoprolol succinate (TOPROL-XL) 24 hr tablet 25 mg  25 mg Oral q morning - 10a Sid Falcon, MD   25 mg at 04/29/14 1016  . OLANZapine zydis (ZYPREXA) disintegrating tablet 10 mg  10 mg Oral QHS Sid Falcon, MD   10 mg at 04/28/14 2238  . ondansetron (ZOFRAN) tablet 4 mg  4 mg Oral Q6H PRN Sid Falcon, MD       Or  . ondansetron [facility]) injection 4 mg  4 mg Intravenous Q6H PRN Sid Falcon, MD      . simvastatin (ZOCOR) tablet 10 mg  10 mg Oral QHS Sid Falcon, MD   10 mg at 04/28/14 2238  . tamsulosin (FLOMAX) capsule 0.4 mg  0.4 mg Oral q morning - 10a Sid Falcon, MD   0.4 mg at 04/29/14 1016  . traZODone (DESYREL) tablet 100 mg  100 mg Oral QHS Sid Falcon, MD   100 mg at 04/28/14 2238     Psychiatric Specialty Exam: Physical Exam  ROS  Blood pressure 130/68, pulse 63, temperature 97.7 F (36.5 C), temperature source Oral, resp. rate 18, height '5\' 11"'  (1.803 m), weight 68.947 kg (152 lb), SpO2 97.00%.Body mass index is 21.21 kg/(m^2).  General Appearance: Guarded  Eye Contact::  Fair  Speech:  Blocked and Clear and Coherent  Volume:  Normal  Mood:  Angry, Anxious and Irritable  Affect:  Labile  Thought Process:  Disorganized, Irrelevant and Tangential  Orientation:  Full (Time, Place, and Person)  Thought Content:  WDL  Suicidal Thoughts:  No  Homicidal Thoughts:  No  Memory:  Immediate;   Fair  Judgement:  Impaired  Insight:  Lacking  Psychomotor Activity:  Restlessness  Concentration:  Fair  Recall:  [place]: Fair  Akathisia:  NA  Handed:  Right  AIMS (if indicated):     Assets:  Communication Skills Desire for Improvement Financial Resources/Insurance Housing Intimacy Leisure Time Physical Health Resilience Social Support  Sleep:      Musculoskeletal: Strength & Muscle Tone: within normal limits Gait & Station: unable to stand Patient leans: N/A  Treatment Plan Summary: Daily contact with patient to assess and evaluate symptoms and progress in treatment Medication management  Imane Burrough,JANARDHAHA R. 04/29/2014 1:09 PM

## 2014-04-29 NOTE — Progress Notes (Signed)
Progress Note   JALYN PLUMBER [id number] DOB: 06/11/1951 DOA: 04/28/2014 PCP: Ashok Norris, MD   Brief Narrative:   LORAN WAGENBLAST is an 63 y.o. male with PMH of Bipolar type 1, CAD, chronic systolic CHF (TTE 123XX123 with EF of 30-35%), complete HB with PM in place, CKD, DI, h/o DVT and PE (not on coumadin currently) who was admitted 04/28/14 with recent confusion and acute kidney injury. Of note, he was recently hospitalized from 04/09/14-04/23/14 for treatment of lithium toxicity. He was subsequently discharged to a SNF. He called the police from his SNF, stating he was being "held hostage".    Assessment/Plan:   Principal Problem:   Psychosis / bipolar 1 disorder  Currently being managed with Xanax, Haldol PRN, Zyprexa and Trazodone. Required an IM dose of Haldol after wandering off the unit, refusing to return to his room, insisting we called the [employer] because he is being held against his will.  Psychiatry consult requested. Has involuntary commitment papers on the chart.  Check TSH and B12 levels.  Active Problems:   Normocytic anemia  Check B12 level.  Iron/ferritin done during previous hospital stay and did not show any deficiencies.    Coronary artery disease  Continue Plavix, metoprolol, and Zocor.    Cardiac pacemaker in situ    AKI (acute kidney injury) in the setting of stage III chronic kidney disease  Renal ultrasound done 7/15, echogenic parenchyma but no hydronephrosis or renal obstruction.  Continue IV fluids, likely prerenal in etiology. Baseline creatinine 1.9-2.  Avoid nephrotoxic medications.    Chronic systolic CHF (congestive heart failure)  Status post 2-D echo 01/08/14: EF 30-35%.    DVT Prophylaxis  Continue subcutaneous heparin.  Code Status: Full. Family Communication: Terrico Zurn (wife) updated by telephone. Disposition Plan: [place] (SNF) when stable.   IV Access:    Peripheral IV   Procedures and diagnostic studies:     04/28/14: Chest x-ray: No acute cardiopulmonary abnormality. Subsegmental atelectasis.  04/28/14: CT of the head: No acute intracranial process. Encephalomalacia in the right posterior parietal/occipital lobe.  04/29/14: Renal ultrasound: Slightly echogenic renal parenchyma. No hydronephrosis or renal extraction.   Medical Consultants:    Psychiatry   Other Consultants:    None.   Anti-Infectives:    None.  Subjective:   Reggie Pile Gilmer's mental status changes began 2 months ago per wife.  He was taken off lithium with signs of toxicity (polydipsia), subsequently developed wandering behavior and talking to himself.  He has been wandering off the unit, refusing to return to his room.  He had to be brought back to his room by security.  Wants to call the [employer].  Needed an IM of Haldol.    Objective:    Filed Vitals:   04/28/14 1613 04/28/14 1730 04/28/14 1807 04/28/14 2215  BP: 142/76 100/76 109/57 130/68  Pulse: 72 64 73 63  Temp: 97.9 F (36.6 C) 98 F (36.7 C) 98.4 F (36.9 C) 97.7 F (36.5 C)  TempSrc: Oral Oral Oral Oral  Resp: 18 18 18 18   Height:   5\' 11"  (1.803 m)   Weight:   68.947 kg (152 lb)   SpO2: 96% 97% 96% 97%    Intake/Output Summary (Last 24 hours) at 04/29/14 0941 Last data filed at 04/29/14 0841  Gross per 24 hour  Intake    360 ml  Output    700 ml  Net   -340 ml    Exam: Gen:  Agitated  Psych: Disoriented to place and date. Paranoid. Disorganized thoughts. Cardiovascular:  RRR, No M/R/G Respiratory:  Lungs CTAB Gastrointestinal:  Abdomen soft, NT/ND, + BS Extremities:  No C/E/C   Data Reviewed:    Labs: Basic Metabolic Panel:  Recent Labs Lab 04/28/14 1206 04/29/14 0435  NA 139 139  K 4.4 4.4  CL 103 108  CO2 20 18*  GLUCOSE 88 95  BUN 25* 23  CREATININE 2.40* 2.28*  CALCIUM 10.1 9.5   GFR Estimated Creatinine Clearance: 31.9 ml/min (by C-G formula based on Cr of 2.28). Liver Function Tests:  Recent Labs Lab  04/28/14 1206  AST 27  ALT 25  ALKPHOS 96  BILITOT 0.3  PROT 6.8  ALBUMIN 3.6   CBC:  Recent Labs Lab 04/28/14 1206 04/29/14 0435  WBC 5.8 4.6  NEUTROABS 4.7  --   HGB 10.7* 9.9*  HCT 33.8* 31.5*  MCV 96.8 98.1  PLT 179 191   Thyroid function studies: No results found for this basename: TSH, T4TOTAL, FREET3, T3FREE, THYROIDAB,  in the last 72 hours Anemia work up: No results found for this basename: VITAMINB12, FOLATE, FERRITIN, TIBC, IRON, RETICCTPCT,  in the last 72 hours Sepsis Labs:  Recent Labs Lab 04/28/14 1206 04/29/14 0435  WBC 5.8 4.6   Microbiology No results found for this or any previous visit (from the past 240 hour(s)).   Medications:   . ALPRAZolam  1 mg Oral TID  . clopidogrel  75 mg Oral q morning - 10a  . heparin  5,000 Units Subcutaneous 3 times per day  . metoprolol succinate  25 mg Oral q morning - 10a  . OLANZapine zydis  10 mg Oral QHS  . simvastatin  10 mg Oral QHS  . tamsulosin  0.4 mg Oral q morning - 10a  . traZODone  100 mg Oral QHS   Continuous Infusions: . sodium chloride 75 mL/hr at 04/28/14 1815    Time spent: 35 minutes with > 50% of time discussing current diagnostic test results, clinical impression and plan of care with the patient's wife, and attempting to de-escalate the patient when he was in an agitated state.    LOS: 1 day   RAMA,CHRISTINA  [facility] Pager [phone]. If unable to reach me by pager, please call my cell phone at [phone].  *Please refer to [email or web address], password [email or web address] to get updated schedule on who will round on this patient, as hospitalists switch teams weekly. If 7PM-7AM, please contact night-coverage at [email or web address], password [email or web address] for any overnight needs.  04/29/2014, 9:41 AM    **Disclaimer: This note was dictated with voice recognition software. Similar sounding words can inadvertently be transcribed and this note may contain transcription errors which may not have been corrected upon  publication of note.**

## 2014-04-29 NOTE — Progress Notes (Signed)
Utilization review completed.  

## 2014-04-29 NOTE — Care Management Note (Addendum)
    Page 1 of 1   05/08/2014     2:21:49 PM CARE MANAGEMENT NOTE 05/08/2014  Patient:  Jeffrey Herring, Jeffrey Herring   Account Number:  [id number]  Date Initiated:  04/29/2014  Documentation initiated by:  [facility]  Subjective/Objective Assessment:   adm: AKI     Action/Plan:   Return to [facility]   Anticipated DC Date:  05/09/2014   Anticipated DC Plan:  [place]  CM consult      Choice offered to / List presented to:             Status of service:  In process, will continue to follow Medicare Important Message given?   (If response is "NO", the following Medicare IM given date fields will be blank) Date Medicare IM given:   Medicare IM given by:   Date Additional Medicare IM given:   Additional Medicare IM given by:    Discharge Disposition:    Per UR Regulation:  Reviewed for med. necessity/level of care/duration of stay  If discussed at [place] of Stay Meetings, dates discussed:    Comments:  05/08/14 [facility] RN,BSN NCM [id number] AWAITING INPT [place].  04/29/14 14:10 CM reviewed and CSW to arrange return to [place] at time of discharge.  No other CM needs were communicated.  Mariane Masters, BSN, CM [phone].

## 2014-04-30 DIAGNOSIS — R4182 Altered mental status, unspecified: Secondary | ICD-10-CM | POA: Diagnosis not present

## 2014-04-30 DIAGNOSIS — F319 Bipolar disorder, unspecified: Secondary | ICD-10-CM | POA: Diagnosis not present

## 2014-04-30 DIAGNOSIS — F29 Unspecified psychosis not due to a substance or known physiological condition: Secondary | ICD-10-CM | POA: Diagnosis not present

## 2014-04-30 DIAGNOSIS — N183 Chronic kidney disease, stage 3 unspecified: Secondary | ICD-10-CM | POA: Diagnosis not present

## 2014-04-30 DIAGNOSIS — F3160 Bipolar disorder, current episode mixed, unspecified: Secondary | ICD-10-CM | POA: Diagnosis not present

## 2014-04-30 LAB — BASIC METABOLIC PANEL
Anion gap: 14 (ref 5–15)
BUN: 22 mg/dL (ref 6–23)
CO2: 20 mEq/L (ref 19–32)
Calcium: 9.9 mg/dL (ref 8.4–10.5)
Chloride: 106 mEq/L (ref 96–112)
Creatinine, Ser: 2.27 mg/dL — ABNORMAL HIGH (ref 0.50–1.35)
GFR calc Af Amer: 33 mL/min — ABNORMAL LOW (ref >90)
GFR calc non Af Amer: 29 mL/min — ABNORMAL LOW (ref >90)
Glucose, Bld: 100 mg/dL — ABNORMAL HIGH (ref 70–99)
Potassium: 4.1 mEq/L (ref 3.7–5.3)
Sodium: 140 mEq/L (ref 137–147)

## 2014-04-30 LAB — GLUCOSE, CAPILLARY: Glucose-Capillary: 90 mg/dL (ref 70–99)

## 2014-04-30 NOTE — Progress Notes (Signed)
Utilization review completed.  

## 2014-04-30 NOTE — Progress Notes (Signed)
Progress Note   Jeffrey Herring [id number] DOB: Jul 03, 1951 DOA: 04/28/2014 PCP: Ashok Norris, MD   Brief Narrative:   Jeffrey Herring is an 63 y.o. male with PMH of Bipolar type 1, CAD, chronic systolic CHF (TTE 123XX123 with EF of 30-35%), complete HB with PM in place, CKD, DI, h/o DVT and PE (not on coumadin currently) who was admitted 04/28/14 with recent confusion and acute kidney injury. Of note, he was recently hospitalized from 04/09/14-04/23/14 for treatment of lithium toxicity. He was subsequently discharged to a SNF. He called the police from his SNF, stating he was being "held hostage".    Assessment/Plan:   Principal Problem:   Psychosis / bipolar 1 disorder  Currently being managed with Xanax, Haldol PRN, Zyprexa, Lamictal and Trazodone. Required an IM dose of Haldol after wandering off the unit 04/29/14, refusing to return to his room, insisting we called the FBI because he is being held against his will.  Currently sedated but nursing staff reports that he is intermittently agitated.  Has involuntary commitment papers on the chart.  Evaluated by psychiatrist 04/29/14, medications adjusted with increased Zyprexa and initiation of lamictal.  TSH and B12 levels WNL.  Active Problems:   Normocytic anemia  B12 level WNL.  Iron/ferritin done during previous hospital stay and did not show any deficiencies.    Coronary artery disease  Continue Plavix, metoprolol, and Zocor.    Cardiac pacemaker in situ    AKI (acute kidney injury) in the setting of stage III chronic kidney disease  Renal ultrasound done 7/15, echogenic parenchyma but no hydronephrosis or renal obstruction.  Continue IV fluids, likely prerenal in etiology. Baseline creatinine 1.9-2.  Avoid nephrotoxic medications.    Chronic systolic CHF (congestive heart failure)  Status post 2-D echo 01/08/14: EF 30-35%.  Currently compensated.    DVT Prophylaxis  Continue subcutaneous heparin.  Code  Status: Full. Family Communication: Jeffrey Herring (wife) updated by telephone 04/29/14. Disposition Plan: [place] (SNF) when stable.   IV Access:    Peripheral IV   Procedures and diagnostic studies:    04/28/14: Chest x-ray: No acute cardiopulmonary abnormality. Subsegmental atelectasis.  04/28/14: CT of the head: No acute intracranial process. Encephalomalacia in the right posterior parietal/occipital lobe.  04/29/14: Renal ultrasound: Slightly echogenic renal parenchyma. No hydronephrosis or renal obstruction.   Medical Consultants:    Durward Parcel, MD, Psychiatry   Other Consultants:    None.   Anti-Infectives:    None.  Subjective:   Jeffrey Herring is currently sedated with a sitter at the bedside. He awakens briefly to voice but quickly falls back to sleep. Says his appetite is improving. Nursing staff/sitter reports that he has intermittent agitation.    Objective:    Filed Vitals:   04/28/14 2215 04/29/14 1401 04/29/14 2040 04/30/14 0650  BP: 130/68 124/64 101/55 127/61  Pulse: 63 66 67 77  Temp: 97.7 F (36.5 C) 97.3 F (36.3 C) 98 F (36.7 C) 97.4 F (36.3 C)  TempSrc: Oral Oral Oral Oral  Resp: 18 20 20 20   Height:      Weight:      SpO2: 97% 96% 97% 96%    Intake/Output Summary (Last 24 hours) at 04/30/14 0819 Last data filed at 04/29/14 1212  Gross per 24 hour  Intake    480 ml  Output    700 ml  Net   -220 ml    Exam: Gen:  Sedated Psych: Calm with flat affect. Cardiovascular:  RRR, No M/R/G Respiratory:  Lungs CTAB Gastrointestinal:  Abdomen soft, NT/ND, + BS Extremities:  No C/E/C   Data Reviewed:    Labs: Basic Metabolic Panel:  Recent Labs Lab 04/28/14 1206 04/29/14 0435 04/30/14 0424  NA 139 139 140  K 4.4 4.4 4.1  CL 103 108 106  CO2 20 18* 20  GLUCOSE 88 95 100*  BUN 25* 23 22  CREATININE 2.40* 2.28* 2.27*  CALCIUM 10.1 9.5 9.9   GFR Estimated Creatinine Clearance: 32 ml/min (by C-G formula  based on Cr of 2.27). Liver Function Tests:  Recent Labs Lab 04/28/14 1206  AST 27  ALT 25  ALKPHOS 96  BILITOT 0.3  PROT 6.8  ALBUMIN 3.6   CBC:  Recent Labs Lab 04/28/14 1206 04/29/14 0435  WBC 5.8 4.6  NEUTROABS 4.7  --   HGB 10.7* 9.9*  HCT 33.8* 31.5*  MCV 96.8 98.1  PLT 179 191   Thyroid function studies:  Recent Labs  04/29/14 1030  TSH 0.644   Anemia work up:  Recent Labs  04/29/14 1030  VITAMINB12 444   Sepsis Labs:  Recent Labs Lab 04/28/14 1206 04/29/14 0435  WBC 5.8 4.6   Microbiology No results found for this or any previous visit (from the past 240 hour(s)).   Medications:   . ALPRAZolam  1 mg Oral TID  . clopidogrel  75 mg Oral q morning - 10a  . heparin  5,000 Units Subcutaneous 3 times per day  . lamoTRIgine  25 mg Oral BID  . metoprolol succinate  25 mg Oral q morning - 10a  . OLANZapine zydis  10 mg Oral QHS  . OLANZapine zydis  5 mg Oral q morning - 10a  . simvastatin  10 mg Oral QHS  . tamsulosin  0.4 mg Oral q morning - 10a  . traZODone  100 mg Oral QHS   Continuous Infusions: . sodium chloride 75 mL/hr at 04/29/14 2248    Time spent: 25 minutes.    LOS: 2 days   Jeffrey Herring  [facility] Pager [phone]. If unable to reach me by pager, please call my cell phone at [phone].  *Please refer to [email or web address], password [email or web address] to get updated schedule on who will round on this patient, as hospitalists switch teams weekly. If 7PM-7AM, please contact night-coverage at [email or web address], password [email or web address] for any overnight needs.  04/30/2014, 8:19 AM    **Disclaimer: This note was dictated with voice recognition software. Similar sounding words can inadvertently be transcribed and this note may contain transcription errors which may not have been corrected upon publication of note.**

## 2014-04-30 NOTE — Consult Note (Signed)
Jeffrey Herring   Reason for Herring:  Bipolar disorder and altered mental status Referring Physician:  Jacquelynn Cree, MD Jeffrey Herring is an 63 y.o. male. Total Time spent with patient: 45 minutes  Assessment: AXIS I:  Bipolar, mixed AXIS II:  Deferred AXIS III:   Past Medical History  Diagnosis Date  . Hyperlipemia   . Pacemaker   . Arrhythmia   . Diabetes insipidus   . History of kidney stones   . History of DVT (deep vein thrombosis)   . History of pulmonary embolism   . Bipolar 1 disorder   . Lithium toxicity   . Stage III chronic kidney disease    AXIS IV:  other psychosocial or environmental problems, problems related to social environment and problems with primary support group AXIS V:  31-40 impairment in reality testing  Plan:  Case will be discussed with Jeffrey Messing, MD Recommend psychiatric Inpatient admission when medically cleared. Supportive therapy provided about ongoing stressors. Continue Zyprexa Zydis 10 mg at bedtime and 5 mg daily morning starting tomorrow, we'll start Lamictal chewable 25 mg twice daily for controlling bipolar mania Monitor for side effects of the medications Psychiatric consultation followup as clinically required Appreciate psychiatric consultation Please contact [phone] if needs further assistance  Subjective:   Jeffrey Herring is a 63 y.o. male patient admitted with Bipolar disorder.  HPI:  Jeffrey Herring is a 63 yo male  known to this provider from his recent admission to [facility] with lithium toxicity and delirium/altered mental status. Patient was admitted to [facility] with confusion and manic symptoms. Patient has AKI and not recommended restarting lithium treatment. Patient has been irritable, angry and grandiose during this evaluation.  Interval History: Patient continue to be manic and poor historian. Patient has been compliant with his medications and has mild sedation  probably secondary to medication and he will be adjusting to increased dosage and also for lamictal without significant adverse effects or allergic reactions. Psych social work is working on finding a geriatric psych hospitalization for better stabilization as he is difficult to manage with and without lithium.   Medical history: Patient with PMH of Bipolar type 1, CAD, chronic systolic CHF (TTE 06/2425 with EF of 30-35%), complete HB with PM in place, CKD, DI, h/o DVT and PE (not on coumadin currently) who presents with increased confusion and AKI. Jeffrey Herring was alert, but not able to have a completely coherent conversation when I saw him. He was recently admitted to the hospital for Li toxicity, abnormal behavior and AKI and was discharged to a SNF. Today, he called 911 from the SNF stating that they were holding him hostage and he was brought to the ED. Jeffrey Herring does know that he is at the hospital, but is very tangential in thinking and reports that he has "somewhere" to be and cannot stay in the hospital. I called and spoke with his wife, Jeffrey Herring, who noted that Jeffrey Herring has struggled with Bipolar disorder. About 10 years ago, he had similar issues and was admitted to a psychiatric hospital and started on Lithium with good results. However, about 6 weeks ago, he began having bizarre and abnormal behavior again and was admitted with Jeffrey Herring toxicity. He is now on Zyprexa for his Bipolar disorder. Labs in the ED showed a mild worsening of his CKD to 2.4 (baseline 2) along with some likely dehydration with hemoconcentration on H/H. He was placed under IVC by  the ED provider.   HPI Elements:   Location:  Bipolar mixed mania. Quality:  Poor. Severity:  Moderate. Timing:  Unknown stressors.  Past Psychiatric History: Past Medical History  Diagnosis Date  . Hyperlipemia   . Pacemaker   . Arrhythmia   . Diabetes insipidus   . History of kidney stones   . History of DVT (deep vein thrombosis)   .  History of pulmonary embolism   . Bipolar 1 disorder   . Lithium toxicity   . Stage III chronic kidney disease     reports that he has been smoking Cigars.  He has never used smokeless tobacco. He reports that he does not drink alcohol or use illicit drugs. Family History  Problem Relation Age of Onset  . Depression Mother          Abuse/Neglect [facility]) Physical Abuse: Denies Verbal Abuse: Denies Sexual Abuse: Denies Allergies:   Allergies  Allergen Reactions  . Aspirin Hives  . Codeine Nausea And Vomiting  . Demerol [Meperidine] Nausea And Vomiting    ACT Assessment Complete:  NO Objective: Blood pressure 113/63, pulse 64, temperature 98.1 F (36.7 C), temperature source Oral, resp. rate 18, height '5\' 11"'  (1.803 m), weight 68.947 kg (152 lb), SpO2 98.00%.Body mass index is 21.21 kg/(m^2). Results for orders placed during the hospital encounter of 04/28/14 (from the past 72 hour(s))  CBC WITH DIFFERENTIAL     Status: Abnormal   Collection Time    04/28/14 12:06 PM      Result Value Ref Range   WBC 5.8  4.0 - 10.5 K/uL   RBC 3.49 (*) 4.22 - 5.81 MIL/uL   Hemoglobin 10.7 (*) 13.0 - 17.0 g/dL   HCT 33.8 (*) 39.0 - 52.0 %   MCV 96.8  78.0 - 100.0 fL   MCH 30.7  26.0 - 34.0 pg   MCHC 31.7  30.0 - 36.0 g/dL   RDW 14.9  11.5 - 15.5 %   Platelets 179  150 - 400 K/uL   Neutrophils Relative % 80 (*) 43 - 77 %   Neutro Abs 4.7  1.7 - 7.7 K/uL   Lymphocytes Relative 8 (*) 12 - 46 %   Lymphs Abs 0.5 (*) 0.7 - 4.0 K/uL   Monocytes Relative 10  3 - 12 %   Monocytes Absolute 0.6  0.1 - 1.0 K/uL   Eosinophils Relative 2  0 - 5 %   Eosinophils Absolute 0.1  0.0 - 0.7 K/uL   Basophils Relative 0  0 - 1 %   Basophils Absolute 0.0  0.0 - 0.1 K/uL  COMPREHENSIVE METABOLIC PANEL     Status: Abnormal   Collection Time    04/28/14 12:06 PM      Result Value Ref Range   Sodium 139  137 - 147 mEq/L   Potassium 4.4  3.7 - 5.3 mEq/L   Chloride 103  96 - 112 mEq/L   CO2 20  19 - 32 mEq/L    Glucose, Bld 88  70 - 99 mg/dL   BUN 25 (*) 6 - 23 mg/dL   Creatinine, Ser 2.40 (*) 0.50 - 1.35 mg/dL   Calcium 10.1  8.4 - 10.5 mg/dL   Total Protein 6.8  6.0 - 8.3 g/dL   Albumin 3.6  3.5 - 5.2 g/dL   AST 27  0 - 37 U/L   ALT 25  0 - 53 U/L   Alkaline Phosphatase 96  39 - 117 U/L  Total Bilirubin 0.3  0.3 - 1.2 mg/dL   GFR calc non Af Amer 27 (*) >90 mL/min   GFR calc Af Amer 31 (*) >90 mL/min   Comment: (NOTE)     The eGFR has been calculated using the CKD EPI equation.     This calculation has not been validated in all clinical situations.     eGFR's persistently <90 mL/min signify possible Chronic Kidney     Disease.   Anion gap 16 (*) 5 - 15  LITHIUM LEVEL     Status: Abnormal   Collection Time    04/28/14 12:06 PM      Result Value Ref Range   Lithium Lvl <0.25 (*) 0.80 - 1.40 mEq/L  URINALYSIS, ROUTINE W REFLEX MICROSCOPIC     Status: None   Collection Time    04/28/14 12:24 PM      Result Value Ref Range   Color, Urine YELLOW  YELLOW   APPearance CLEAR  CLEAR   Specific Gravity, Urine 1.008  1.005 - 1.030   pH 6.0  5.0 - 8.0   Glucose, UA NEGATIVE  NEGATIVE mg/dL   Hgb urine dipstick NEGATIVE  NEGATIVE   Bilirubin Urine NEGATIVE  NEGATIVE   Ketones, ur NEGATIVE  NEGATIVE mg/dL   Protein, ur NEGATIVE  NEGATIVE mg/dL   Urobilinogen, UA 0.2  0.0 - 1.0 mg/dL   Nitrite NEGATIVE  NEGATIVE   Leukocytes, UA NEGATIVE  NEGATIVE   Comment: MICROSCOPIC NOT DONE ON URINES WITH NEGATIVE PROTEIN, BLOOD, LEUKOCYTES, NITRITE, OR GLUCOSE <1000 mg/dL.  BASIC METABOLIC PANEL     Status: Abnormal   Collection Time    04/29/14  4:35 AM      Result Value Ref Range   Sodium 139  137 - 147 mEq/L   Potassium 4.4  3.7 - 5.3 mEq/L   Chloride 108  96 - 112 mEq/L   CO2 18 (*) 19 - 32 mEq/L   Glucose, Bld 95  70 - 99 mg/dL   BUN 23  6 - 23 mg/dL   Creatinine, Ser 2.28 (*) 0.50 - 1.35 mg/dL   Calcium 9.5  8.4 - 10.5 mg/dL   GFR calc non Af Amer 29 (*) >90 mL/min   GFR calc Af Amer  33 (*) >90 mL/min   Comment: (NOTE)     The eGFR has been calculated using the CKD EPI equation.     This calculation has not been validated in all clinical situations.     eGFR's persistently <90 mL/min signify possible Chronic Kidney     Disease.   Anion gap 13  5 - 15  CBC     Status: Abnormal   Collection Time    04/29/14  4:35 AM      Result Value Ref Range   WBC 4.6  4.0 - 10.5 K/uL   RBC 3.21 (*) 4.22 - 5.81 MIL/uL   Hemoglobin 9.9 (*) 13.0 - 17.0 g/dL   HCT 31.5 (*) 39.0 - 52.0 %   MCV 98.1  78.0 - 100.0 fL   MCH 30.8  26.0 - 34.0 pg   MCHC 31.4  30.0 - 36.0 g/dL   RDW 14.9  11.5 - 15.5 %   Platelets 191  150 - 400 K/uL  VITAMIN B12     Status: None   Collection Time    04/29/14 10:30 AM      Result Value Ref Range   Vitamin B-12 444  211 - 911 pg/mL  Comment: Performed at [facility]  TSH     Status: None   Collection Time    04/29/14 10:30 AM      Result Value Ref Range   TSH 0.644  0.350 - 4.500 uIU/mL   Comment: Performed at [place] PANEL     Status: Abnormal   Collection Time    04/30/14  4:24 AM      Result Value Ref Range   Sodium 140  137 - 147 mEq/L   Potassium 4.1  3.7 - 5.3 mEq/L   Chloride 106  96 - 112 mEq/L   CO2 20  19 - 32 mEq/L   Glucose, Bld 100 (*) 70 - 99 mg/dL   BUN 22  6 - 23 mg/dL   Creatinine, Ser 2.27 (*) 0.50 - 1.35 mg/dL   Calcium 9.9  8.4 - 10.5 mg/dL   GFR calc non Af Amer 29 (*) >90 mL/min   GFR calc Af Amer 33 (*) >90 mL/min   Comment: (NOTE)     The eGFR has been calculated using the CKD EPI equation.     This calculation has not been validated in all clinical situations.     eGFR's persistently <90 mL/min signify possible Chronic Kidney     Disease.   Anion gap 14  5 - 15   Labs are reviewed and are pertinent for .  Current Facility-Administered Medications  Medication Dose Route Frequency Provider Last Rate Last Dose  . acetaminophen (TYLENOL) tablet 650 mg  650 mg Oral Q6H PRN  Sid Falcon, MD   650 mg at 04/29/14 0051   Or  . acetaminophen (TYLENOL) suppository 650 mg  650 mg Rectal Q6H PRN Sid Falcon, MD      . ALPRAZolam [name]) tablet 1 mg  1 mg Oral TID Sid Falcon, MD   1 mg at 04/30/14 1045  . clopidogrel (PLAVIX) tablet 75 mg  75 mg Oral q morning - 10a Sid Falcon, MD   75 mg at 04/30/14 1044  . haloperidol (HALDOL) tablet 5 mg  5 mg Oral Q6H PRN Sid Falcon, MD   5 mg at 04/30/14 0204  . heparin injection 5,000 Units  5,000 Units Subcutaneous 3 times per day Sid Falcon, MD   5,000 Units at 04/29/14 2244  . lamoTRIgine (LAMICTAL) tablet 25 mg  25 mg Oral BID Durward Parcel, MD   25 mg at 04/30/14 1044  . metoprolol succinate (TOPROL-XL) 24 hr tablet 25 mg  25 mg Oral q morning - 10a Sid Falcon, MD   25 mg at 04/30/14 1044  . OLANZapine zydis (ZYPREXA) disintegrating tablet 10 mg  10 mg Oral QHS Sid Falcon, MD   10 mg at 04/29/14 2243  . OLANZapine zydis (ZYPREXA) disintegrating tablet 5 mg  5 mg Oral q morning - 10a Durward Parcel, MD   5 mg at 04/30/14 1044  . simvastatin (ZOCOR) tablet 10 mg  10 mg Oral QHS Sid Falcon, MD   10 mg at 04/29/14 2243  . tamsulosin (FLOMAX) capsule 0.4 mg  0.4 mg Oral q morning - 10a Sid Falcon, MD   0.4 mg at 04/30/14 1044  . traZODone (DESYREL) tablet 100 mg  100 mg Oral QHS Sid Falcon, MD   100 mg at 04/29/14 2243    Psychiatric Specialty Exam: Physical Exam  ROS  Blood pressure 113/63, pulse 64, temperature 98.1 F (  36.7 C), temperature source Oral, resp. rate 18, height '5\' 11"'  (1.803 m), weight 68.947 kg (152 lb), SpO2 98.00%.Body mass index is 21.21 kg/(m^2).  General Appearance: Guarded  Eye Contact::  Fair  Speech:  Blocked and Clear and Coherent  Volume:  Normal  Mood:  Angry, Anxious and Irritable  Affect:  Labile  Thought Process:  Disorganized, Irrelevant and Tangential  Orientation:  Full (Time, Place, and Person)  Thought Content:  WDL  Suicidal  Thoughts:  No  Homicidal Thoughts:  No  Memory:  Immediate;   Fair  Judgement:  Impaired  Insight:  Lacking  Psychomotor Activity:  Restlessness  Concentration:  Fair  Recall:  [place]: Fair  Akathisia:  NA  Handed:  Right  AIMS (if indicated):     Assets:  Communication Skills Desire for Improvement Financial Resources/Insurance Housing Intimacy Leisure Time Physical Health Resilience Social Support  Sleep:      Musculoskeletal: Strength & Muscle Tone: within normal limits Gait & Station: unable to stand Patient leans: N/A  Treatment Plan Summary: Daily contact with patient to assess and evaluate symptoms and progress in treatment Medication management  Maurice Ramseur,JANARDHAHA R. 04/30/2014 2:17 PM

## 2014-04-30 NOTE — Progress Notes (Signed)
Attempted to get EKG on patient - unable to accomplish due to patient removing leads and agitated. MD notified.

## 2014-04-30 NOTE — Progress Notes (Addendum)
Clinical Social Work  CSW was informed that patient is medically stable to DC to psych facility today. CSW went to room but patient was sleeping. CSW spoke with wife via phone and explained inpatient recommendations. Wife is anxious and reports that she is overwhelmed with decisions. Wife has CSW contact information if needed and CSW agreeable to keep wife updated on plans. CSW explained facilities that are being contacted and agreeable to keep wife updated. Wife is requesting a facility close to home so that she can continue to visit but is aware that a local facility might not be available.  CSW contacted the following facilities re: placement:  [facility]- left a message with admissions at 9:46  [facility]- left a message with admissions at 9:48  [facility]- available beds. Referral faxed (Addendum 1520: Hospital confirmed referral received but intake RN has not reviewed information yet. Hospital will call once they determine if they accept patient.)  [facility]- 1 available bed. Referral faxed (Addendum 1430: Hospital has received referral but no available beds today. Hospital will keep referral and call CSW if bed becomes available.)  [facility]- no available beds  [facility]- available beds. Referral faxed (Addendum 1400: Patient denied by Dr. Dareen Piano due to medical acuity)   [facility]- available beds. Referral faxed (Addendum 1522: Admissions reports referral has not been reviewed but will call back once they determine if they can accept)  [facility]- available beds. Referral faxed. (Addendum 1530: Admissions reports no available beds at this time.)   [facility]- no available beds.  [facility]- no available beds  CSW will continue to follow.  [place], [place] [phone]

## 2014-04-30 NOTE — Progress Notes (Addendum)
Unit CSW was following for disposition planning as pt was admitted from [facility] [address]  Psych MD recommended inpatient psych facility. Pscyh CSW, Sindy Messing assisting with placement.  CSW updated [employer]  Unit CSW to continue to follow and assist if needed.  Alison Murray, MSW, [place] Work [phone]

## 2014-05-01 DIAGNOSIS — F29 Unspecified psychosis not due to a substance or known physiological condition: Secondary | ICD-10-CM | POA: Diagnosis not present

## 2014-05-01 DIAGNOSIS — R4182 Altered mental status, unspecified: Secondary | ICD-10-CM | POA: Diagnosis not present

## 2014-05-01 DIAGNOSIS — IMO0002 Reserved for concepts with insufficient information to code with codable children: Secondary | ICD-10-CM

## 2014-05-01 DIAGNOSIS — F3160 Bipolar disorder, current episode mixed, unspecified: Secondary | ICD-10-CM | POA: Diagnosis not present

## 2014-05-01 DIAGNOSIS — R4589 Other symptoms and signs involving emotional state: Secondary | ICD-10-CM | POA: Diagnosis not present

## 2014-05-01 DIAGNOSIS — R451 Restlessness and agitation: Secondary | ICD-10-CM | POA: Diagnosis present

## 2014-05-01 DIAGNOSIS — F319 Bipolar disorder, unspecified: Secondary | ICD-10-CM | POA: Diagnosis not present

## 2014-05-01 LAB — GLUCOSE, CAPILLARY: Glucose-Capillary: 96 mg/dL (ref 70–99)

## 2014-05-01 MED ORDER — LAMOTRIGINE 25 MG PO TABS
50.0000 mg | ORAL_TABLET | Freq: Two times a day (BID) | ORAL | Status: DC
  Administered 2014-05-01 – 2014-05-06 (×10): 50 mg via ORAL
  Filled 2014-05-01 (×11): qty 2

## 2014-05-01 MED ORDER — HALOPERIDOL LACTATE 5 MG/ML IJ SOLN
INTRAMUSCULAR | Status: AC
  Administered 2014-05-01: 5 mg via INTRAMUSCULAR
  Filled 2014-05-01: qty 1

## 2014-05-01 MED ORDER — HALOPERIDOL LACTATE 5 MG/ML IJ SOLN
5.0000 mg | Freq: Four times a day (QID) | INTRAMUSCULAR | Status: DC | PRN
  Administered 2014-05-01: 5 mg via INTRAMUSCULAR

## 2014-05-01 MED ORDER — HALOPERIDOL 5 MG PO TABS
5.0000 mg | ORAL_TABLET | Freq: Four times a day (QID) | ORAL | Status: DC | PRN
  Administered 2014-05-07 – 2014-05-09 (×3): 5 mg via ORAL
  Filled 2014-05-01 (×4): qty 1

## 2014-05-01 NOTE — Progress Notes (Signed)
Clinical Social Work  CSW spoke with wife and explained that CSW will continue to search for inpatient placement. Wife anxious about placement and does not want patient placed far away. CSW explained areas searched such as [place], [place], [facility], [place], [place], etc. Wife understanding of plans and thanked CSW for update.  [place], [place] [phone]
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
## 2014-05-01 NOTE — Progress Notes (Signed)
Utilization review completed.  

## 2014-05-01 NOTE — Progress Notes (Addendum)
Clinical Social Work  CSW continues to search for placement. CSW contacted the following places:  [facility]- left a message with admissions at 8:38  [facility]- left a message with admissions at 8:39  [place] called [employer] to get authorization for [facility]. CSW had to leave a message with Orson Gear at 9:44. CSW will submit information to [facility] once authorization is received. (Addendum 1025: [employer] refusing to provide authorization since patient is not currently in [facility]. CSW spoke with [facility] who provided auth #: [id number] valid from 7/17-7/23. CSW faxed referral to [facility] and completed phone assessment.) Addendum 1614: Patient accepted to [facility] waiting list  [facility]- denied 7/16  [facility]-Has referral but no available beds at this time. Hospital will call if bed is available.  [facility]- no available beds   [facility]- denied on 7/16  [facility]- no available beds but will call if bed becomes available.  [facility]- denied 7/17  [facility]- no available beds.   [facility]- no available beds  CSW will continue to follow.  [place], [place] [phone]
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
## 2014-05-02 DIAGNOSIS — R4182 Altered mental status, unspecified: Secondary | ICD-10-CM | POA: Diagnosis not present

## 2014-05-02 DIAGNOSIS — E43 Unspecified severe protein-calorie malnutrition: Secondary | ICD-10-CM | POA: Diagnosis not present

## 2014-05-02 DIAGNOSIS — F29 Unspecified psychosis not due to a substance or known physiological condition: Secondary | ICD-10-CM | POA: Diagnosis not present

## 2014-05-02 DIAGNOSIS — F319 Bipolar disorder, unspecified: Secondary | ICD-10-CM | POA: Diagnosis not present
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
## 2014-05-03 DIAGNOSIS — E43 Unspecified severe protein-calorie malnutrition: Secondary | ICD-10-CM | POA: Diagnosis not present

## 2014-05-03 DIAGNOSIS — F29 Unspecified psychosis not due to a substance or known physiological condition: Secondary | ICD-10-CM | POA: Diagnosis not present

## 2014-05-03 DIAGNOSIS — F3160 Bipolar disorder, current episode mixed, unspecified: Secondary | ICD-10-CM | POA: Diagnosis not present

## 2014-05-03 DIAGNOSIS — F319 Bipolar disorder, unspecified: Secondary | ICD-10-CM | POA: Diagnosis not present

## 2014-05-03 DIAGNOSIS — R4182 Altered mental status, unspecified: Secondary | ICD-10-CM | POA: Diagnosis not present

## 2014-05-03 NOTE — Progress Notes (Signed)
Progress Note   Jeffrey Herring [id number] DOB: Apr 09, 1951 DOA: 04/28/2014 PCP: Ashok Norris, MD   Brief Narrative:   Jeffrey Herring is an 63 y.o. male with PMH of Bipolar type 1, CAD, chronic systolic CHF (TTE 123XX123 with EF of 30-35%), complete HB with PM in place, CKD, DI, h/o DVT and PE (not on coumadin currently) who was admitted 04/28/14 with recent confusion and acute kidney injury. Of note, he was recently hospitalized from 04/09/14-04/23/14 for treatment of lithium toxicity. He was subsequently discharged to a SNF. He called the police from his SNF, stating he was being "held hostage".  Psychiatry has recommended inpatient psychiatric treatment. Awaiting placement.  Assessment/Plan:   Principal Problem:   Psychosis / bipolar 1 disorder  TSH and B12 levels WNL.  Currently being managed with Xanax, Haldol PRN, Zyprexa, Lamictal and Trazodone.    Has involuntary commitment papers on the chart.  Evaluated by psychiatrist 04/29/14, medications adjusted with increased Zyprexa and initiation of lamictal.  Remains intermittently agitated.  Active Problems:   Normocytic anemia  B12 level WNL.  Iron/ferritin done during previous hospital stay and did not show any deficiencies.    Coronary artery disease  Continue Plavix, metoprolol, and Zocor.    Cardiac pacemaker in situ    AKI (acute kidney injury) in the setting of stage III chronic kidney disease  Renal ultrasound done 7/15, echogenic parenchyma but no hydronephrosis or renal obstruction.  Continue IV fluids, likely prerenal in etiology. Baseline creatinine 1.9-2.  Avoid nephrotoxic medications.    Chronic systolic CHF (congestive heart failure)  Status post 2-D echo 01/08/14: EF 30-35%.  Currently compensated.    DVT Prophylaxis  Continue subcutaneous heparin.  Code Status: Full. Family Communication: Emiliano Hollett (wife) updated at bedside 05/02/14. Disposition Plan: Psychiatric facility when bed  available.   IV Access:    Peripheral IV   Procedures and diagnostic studies:    04/28/14: Chest x-ray: No acute cardiopulmonary abnormality. Subsegmental atelectasis.  04/28/14: CT of the head: No acute intracranial process. Encephalomalacia in the right posterior parietal/occipital lobe.  04/29/14: Renal ultrasound: Slightly echogenic renal parenchyma. No hydronephrosis or renal obstruction.   Medical Consultants:    Durward Parcel, MD, Psychiatry   Other Consultants:    None.   Anti-Infectives:    None.  Subjective:   Jeffrey Herring is sleeping and somewhat sedated again this morning. Took his medication yesterday but needed encouragement. His safety sitter reports that he was restless this morning, wanting to leave. No nausea, vomiting, dyspnea or pain.  Objective:    Filed Vitals:   05/02/14 1015 05/02/14 1459 05/02/14 2108 05/03/14 0700  BP: 102/56 111/56 102/49 100/54  Pulse: 67 72 61 64  Temp:  97.7 F (36.5 C) 97.9 F (36.6 C) 97.3 F (36.3 C)  TempSrc:  Oral Oral Oral  Resp:  18 16 18   Height:      Weight:      SpO2:  97% 97% 97%    Intake/Output Summary (Last 24 hours) at 05/03/14 0758 Last data filed at 05/02/14 2042  Gross per 24 hour  Intake   1080 ml  Output      0 ml  Net   1080 ml    Exam: Gen:  Lethargic Psych: Sedated currently but continues to have periods of agitation Cardiovascular:  RRR, No M/R/G Respiratory:  Lungs CTAB Gastrointestinal:  Abdomen soft, NT/ND, + BS Extremities:  No C/E/C   Data Reviewed:    Labs:  Basic Metabolic Panel:  Recent Labs Lab 04/28/14 1206 04/29/14 0435 04/30/14 0424  NA 139 139 140  K 4.4 4.4 4.1  CL 103 108 106  CO2 20 18* 20  GLUCOSE 88 95 100*  BUN 25* 23 22  CREATININE 2.40* 2.28* 2.27*  CALCIUM 10.1 9.5 9.9   GFR Estimated Creatinine Clearance: 32 ml/min (by C-G formula based on Cr of 2.27). Liver Function Tests:  Recent Labs Lab 04/28/14 1206  AST 27    ALT 25  ALKPHOS 96  BILITOT 0.3  PROT 6.8  ALBUMIN 3.6   CBC:  Recent Labs Lab 04/28/14 1206 04/29/14 0435  WBC 5.8 4.6  NEUTROABS 4.7  --   HGB 10.7* 9.9*  HCT 33.8* 31.5*  MCV 96.8 98.1  PLT 179 191   Microbiology No results found for this or any previous visit (from the past 240 hour(s)).   Medications:   . ALPRAZolam  1 mg Oral TID  . clopidogrel  75 mg Oral q morning - 10a  . heparin  5,000 Units Subcutaneous 3 times per day  . lamoTRIgine  50 mg Oral BID  . metoprolol succinate  25 mg Oral q morning - 10a  . OLANZapine zydis  10 mg Oral QHS  . OLANZapine zydis  5 mg Oral q morning - 10a  . simvastatin  10 mg Oral QHS  . tamsulosin  0.4 mg Oral q morning - 10a  . traZODone  100 mg Oral QHS   Continuous Infusions:    Time spent: 25 minutes.    LOS: 5 days   [facility] Pager [phone]. If unable to reach me by pager, please call my cell phone at [phone].  *Please refer to [email or web address], password [email or web address] to get updated schedule on who will round on this patient, as hospitalists switch teams weekly. If 7PM-7AM, please contact night-coverage at [email or web address], password [email or web address] for any overnight needs.  05/03/2014, 7:58 AM    **Disclaimer: This note was dictated with voice recognition software. Similar sounding words can inadvertently be transcribed and this note may contain transcription errors which may not have been corrected upon publication of note.**

## 2014-05-03 NOTE — Consult Note (Signed)
Jeffrey Herring   Reason for Herring:  Bipolar disorder and altered mental status Referring Physician:  Jacquelynn Cree, MD Jeffrey Herring is an 63 y.o. male. Total Time spent with patient: 20minutes  Assessment: AXIS I:  Bipolar, mixed AXIS II:  Deferred AXIS III:   Past Medical History  Diagnosis Date  . Hyperlipemia   . Pacemaker   . Arrhythmia   . Diabetes insipidus   . History of kidney stones   . History of DVT (deep vein thrombosis)   . History of pulmonary embolism   . Bipolar 1 disorder   . Lithium toxicity   . Stage III chronic kidney disease    AXIS IV:  other psychosocial or environmental problems, problems related to social environment and problems with primary support group AXIS V:  31-40 impairment in reality testing  Plan:  Case will be discussed with Jeffrey Messing, MD Recommend psychiatric Inpatient admission when medically cleared. Supportive therapy provided about ongoing stressors. Continue Zyprexa Zydis 10 mg at bedtime and 5 mg daily morning starting tomorrow, Increase Lamictal chewable 50 mg twice daily for controlling bipolar mania and may use haldol 5 mg IM or IV  along with Cogentin 1 mg Q8hours PRN for agitation and aggression. Monitor for side effects of the medications Psychiatric consultation followup as clinically required Appreciate psychiatric consultation Please contact [phone] if needs further assistance  Subjective:   Jeffrey Herring is a 63 y.o. male patient admitted with Bipolar disorder.  HPI:  Jeffrey Herring is a 63 yo male  known to this provider from his recent admission to Jeffrey Herring with lithium toxicity and delirium/altered mental status. Patient was admitted to Jeffrey Herring with confusion and manic symptoms. Patient has AKI and not recommended restarting lithium treatment. Patient has been irritable, angry and grandiose during this evaluation.  Interval History: Patient seen today  05/03/14. Wife is at bedside. Apparently he was agitated and pushed sitter on 7/17 but has been compliant with meds and calmed down since then. He is eating a hamburger and is oriented to place and situation. He is still somewhat confused and has slurred speech. He is not stable enough to go home and needs further psychiatric treatment   Medical history: Patient with PMH of Bipolar type 1, CAD, chronic systolic CHF (TTE 123XX123 with EF of 30-35%), complete HB with PM in place, CKD, DI, h/o DVT and PE (not on coumadin currently) who presents with increased confusion and AKI. Jeffrey Herring was alert, but not able to have a completely coherent conversation when I saw him. He was recently admitted to the hospital for Li toxicity, abnormal behavior and AKI and was discharged to a SNF. Today, he called 911 from the SNF stating that they were holding him hostage and he was brought to the ED. Jeffrey Herring does know that he is at the hospital, but is very tangential in thinking and reports that he has "somewhere" to be and cannot stay in the hospital. I called and spoke with his wife, Jeffrey Herring, who noted that Jeffrey Herring has struggled with Bipolar disorder. About 10 years ago, he had similar issues and was admitted to a psychiatric hospital and started on Lithium with good results. However, about 6 weeks ago, he began having bizarre and abnormal behavior again and was admitted with Jeffrey Herring toxicity. He is now on Zyprexa for his Bipolar disorder. Labs in the ED showed a mild worsening of his CKD to 2.4 (baseline 2)  along with some likely dehydration with hemoconcentration on H/H. He was placed under IVC by the ED provider.   HPI Elements:   Location:  Bipolar mixed mania. Quality:  Poor. Severity:  Moderate. Timing:  Unknown stressors.  Past Psychiatric History: Past Medical History  Diagnosis Date  . Hyperlipemia   . Pacemaker   . Arrhythmia   . Diabetes insipidus   . History of kidney stones   . History of DVT (deep  vein thrombosis)   . History of pulmonary embolism   . Bipolar 1 disorder   . Lithium toxicity   . Stage III chronic kidney disease     reports that he has been smoking Cigars.  He has never used smokeless tobacco. He reports that he does not drink alcohol or use illicit drugs. Family History  Problem Relation Age of Onset  . Depression Mother          Abuse/Neglect [facility]) Physical Abuse: Denies Verbal Abuse: Denies Sexual Abuse: Denies Allergies:   Allergies  Allergen Reactions  . Aspirin Hives  . Codeine Nausea And Vomiting  . Demerol [Meperidine] Nausea And Vomiting    ACT Assessment Complete:  NO Objective: Blood pressure 100/54, pulse 64, temperature 97.3 F (36.3 C), temperature source Oral, resp. rate 18, height 5\' 11"  (1.803 m), weight 152 lb (68.947 kg), SpO2 97.00%.Body mass index is 21.21 kg/(m^2). Results for orders placed during the hospital encounter of 04/28/14 (from the past 72 hour(s))  GLUCOSE, CAPILLARY     Status: None   Collection Time    04/30/14  8:53 PM      Result Value Ref Range   Glucose-Capillary 90  70 - 99 mg/dL   Comment 1 Notify RN    GLUCOSE, CAPILLARY     Status: None   Collection Time    05/01/14 12:07 AM      Result Value Ref Range   Glucose-Capillary 96  70 - 99 mg/dL   Comment 1 Notify RN     Labs are reviewed and are pertinent for .  Current Facility-Administered Medications  Medication Dose Route Frequency Provider Last Rate Last Dose  . acetaminophen (TYLENOL) tablet 650 mg  650 mg Oral Q6H PRN Sid Falcon, MD   650 mg at 04/29/14 0051   Or  . acetaminophen (TYLENOL) suppository 650 mg  650 mg Rectal Q6H PRN Sid Falcon, MD      . ALPRAZolam [name]) tablet 1 mg  1 mg Oral TID Sid Falcon, MD   1 mg at 05/03/14 1101  . clopidogrel (PLAVIX) tablet 75 mg  75 mg Oral q morning - 10a Sid Falcon, MD   75 mg at 05/03/14 1101  . haloperidol (HALDOL) tablet 5 mg  5 mg Oral Q6H PRN Venetia Maxon Rama, MD       Or  .  haloperidol lactate (HALDOL) injection 5 mg  5 mg Intramuscular Q6H PRN Venetia Maxon Rama, MD   5 mg at 05/01/14 1158  . heparin injection 5,000 Units  5,000 Units Subcutaneous 3 times per day Sid Falcon, MD   5,000 Units at 04/29/14 2244  . lamoTRIgine (LAMICTAL) tablet 50 mg  50 mg Oral BID Durward Parcel, MD   50 mg at 05/03/14 1101  . metoprolol succinate (TOPROL-XL) 24 hr tablet 25 mg  25 mg Oral q morning - 10a Sid Falcon, MD   25 mg at 05/03/14 1101  . OLANZapine zydis (ZYPREXA) disintegrating tablet 10 mg  10 mg Oral QHS Sid Falcon, MD   10 mg at 05/02/14 2215  . OLANZapine zydis (ZYPREXA) disintegrating tablet 5 mg  5 mg Oral q morning - 10a Durward Parcel, MD   5 mg at 05/03/14 1101  . simvastatin (ZOCOR) tablet 10 mg  10 mg Oral QHS Sid Falcon, MD   10 mg at 05/02/14 2215  . tamsulosin (FLOMAX) capsule 0.4 mg  0.4 mg Oral q morning - 10a Sid Falcon, MD   0.4 mg at 05/03/14 1101  . traZODone (DESYREL) tablet 100 mg  100 mg Oral QHS Sid Falcon, MD   100 mg at 05/02/14 2215    Psychiatric Specialty Exam: Physical Exam  ROS  Blood pressure 100/54, pulse 64, temperature 97.3 F (36.3 C), temperature source Oral, resp. rate 18, height 5\' 11"  (1.803 m), weight 152 lb (68.947 kg), SpO2 97.00%.Body mass index is 21.21 kg/(m^2).  General Appearance: Guarded  Eye Contact::  Fair  Speech:  Blocked and Clear and Coherent  Volume:  Normal  Mood: anxious  Affect:  Labile  Thought Process:  Disorganized, Irrelevant and Tangential  Orientation:  Full (Time, Place, and Person)  Thought Content:  WDL  Suicidal Thoughts:  No  Homicidal Thoughts:  No  Memory:  Immediate;   Fair  Judgement:  Impaired  Insight:  Lacking  Psychomotor Activity:  Restlessness  Concentration:  Fair  Recall:  [place]: Fair  Akathisia:  NA  Handed:  Right  AIMS (if indicated):     Assets:  Communication Skills Desire for  Improvement Financial Resources/Insurance Housing Intimacy Leisure Time Physical Health Resilience Social Support  Sleep:      Musculoskeletal: Strength & Muscle Tone: within normal limits Gait & Station: unable to stand Patient leans: N/A  Treatment Plan Summary: Daily contact with patient to assess and evaluate symptoms and progress in treatment Medication management Continue current medications and pursue inpatient psychiatric placement [place], [facility] 05/03/2014 2:12 PM

## 2014-05-04 DIAGNOSIS — R4182 Altered mental status, unspecified: Secondary | ICD-10-CM | POA: Diagnosis not present

## 2014-05-04 LAB — CBC
HCT: 32.2 % — ABNORMAL LOW (ref 39.0–52.0)
Hemoglobin: 10.1 g/dL — ABNORMAL LOW (ref 13.0–17.0)
MCH: 31.2 pg (ref 26.0–34.0)
MCHC: 31.4 g/dL (ref 30.0–36.0)
MCV: 99.4 fL (ref 78.0–100.0)
Platelets: 151 10*3/uL (ref 150–400)
RBC: 3.24 MIL/uL — ABNORMAL LOW (ref 4.22–5.81)
RDW: 14.3 % (ref 11.5–15.5)
WBC: 4.4 10*3/uL (ref 4.0–10.5)

## 2014-05-04 LAB — BASIC METABOLIC PANEL
Anion gap: 11 (ref 5–15)
BUN: 25 mg/dL — ABNORMAL HIGH (ref 6–23)
CO2: 23 mEq/L (ref 19–32)
Calcium: 9.5 mg/dL (ref 8.4–10.5)
Chloride: 106 mEq/L (ref 96–112)
Creatinine, Ser: 2.4 mg/dL — ABNORMAL HIGH (ref 0.50–1.35)
GFR calc Af Amer: 31 mL/min — ABNORMAL LOW (ref >90)
GFR calc non Af Amer: 27 mL/min — ABNORMAL LOW (ref >90)
Glucose, Bld: 100 mg/dL — ABNORMAL HIGH (ref 70–99)
Potassium: 3.8 mEq/L (ref 3.7–5.3)
Sodium: 140 mEq/L (ref 137–147)

## 2014-05-04 NOTE — Plan of Care (Signed)
Problem: Phase III Progression Outcomes Goal: Voiding independently Outcome: Adequate for Discharge Ambulates to bathroom with standby assist

## 2014-05-04 NOTE — Progress Notes (Signed)
Wife at bedside and pt has a Actuary. Appetite good. Pt cooperative. Waiting for a bed at [facility]

## 2014-05-04 NOTE — Progress Notes (Signed)
Patient ID: Jeffrey Herring, male   DOB: 1950/12/18, 63 y.o.   MRN: [id number]  [facility] PROGRESS NOTE  Jeffrey Herring [id number] DOB: 1950-10-21 DOA: 04/28/2014 PCP: Jeffrey Norris, MD  Brief Narrative:   63 y.o. male with PMH of Bipolar type 1, CAD, chronic systolic CHF (TTE 123XX123 with EF of 30-35%), complete HB with PM in place, CKD, DI, h/o DVT and PE (not on coumadin currently) who was admitted 04/28/14 with recent confusion and acute kidney injury. Of note, he was recently hospitalized from 04/09/14-04/23/14 for treatment of lithium toxicity. He was subsequently discharged to a SNF. He called the police from his SNF, stating he was being "held hostage". Psychiatry has recommended inpatient psychiatric treatment. Awaiting placement.  Assessment/Plan:   Principal Problem:  Psychosis / bipolar 1 disorder  TSH and B12 levels WNL.  Currently being managed with Xanax, Haldol PRN, Zyprexa, Lamictal and Trazodone.  Has involuntary commitment papers on the chart.  Evaluated by psychiatrist 04/29/14, medications adjusted with increased Zyprexa and initiation of lamictal.  Remains intermittently agitated. Placement to inpatient psych pending  Active Problems:  Normocytic anemia  B12 level WNL.  Iron/ferritin done during previous hospital stay and did not show any deficiencies. Hg and Hct remain stable with no signs of active bleeding  Coronary artery disease  Continue Plavix, metoprolol, and Zocor. Cardiac pacemaker in situ  Acute renal failure in the setting of stage III chronic kidney disease  Renal ultrasound done 7/15, echogenic parenchyma but no hydronephrosis or renal obstruction.  Baseline creatinine 1.9-2. Cr is up over the past 24 hours, repeat BMP in AM Avoid nephrotoxic medications. Chronic systolic CHF (congestive heart failure)  Status post 2-D echo 01/08/14: EF 30-35%.  Currently compensated. Stop IVF to avoid volume overload  DVT Prophylaxis  Continue subcutaneous  heparin while inpatient  Code Status: Full.  Family Communication: no family at bedside  Disposition Plan: Psychiatric facility when bed available.   IV Access:   Peripheral IV Procedures and diagnostic studies:   04/28/14: Chest x-ray: No acute cardiopulmonary abnormality. Subsegmental atelectasis.  04/28/14: CT of the head: No acute intracranial process. Encephalomalacia in the right posterior parietal/occipital lobe.  04/29/14: Renal ultrasound: Slightly echogenic renal parenchyma. No hydronephrosis or renal obstruction. Medical Consultants:   Jeffrey Parcel, MD, Psychiatry Other Consultants:   None. Anti-Infectives:   None.  HPI/Subjective: No events overnight.   Objective: Filed Vitals:   05/03/14 0700 05/03/14 1649 05/03/14 2124 05/04/14 0459  BP: 100/54 121/54 103/58 109/68  Pulse: 64 75 72 61  Temp: 97.3 F (36.3 C) 97.6 F (36.4 C) 98.1 F (36.7 C) 96.7 F (35.9 C)  TempSrc: Oral Oral Oral Axillary  Resp: 18 18 16 16   Height:      Weight:      SpO2: 97% 96% 96% 96%    Intake/Output Summary (Last 24 hours) at 05/04/14 1129 Last data filed at 05/04/14 1010  Gross per 24 hour  Intake   2060 ml  Output      0 ml  Net   2060 ml    Exam:   General:  Pt is sleeping but easy to arouse, NAD, confused this AM  Cardiovascular: Regular rate and rhythm, S1/S2, no murmurs, no rubs, no gallops  Respiratory: Clear to auscultation bilaterally, no wheezing, no crackles, no rhonchi  Abdomen: Soft, non tender, non distended, bowel sounds present, no guarding  Extremities: No edema, pulses DP and PT palpable bilaterally   Data Reviewed: Basic Metabolic Panel:  Recent  Labs Lab 04/28/14 1206 04/29/14 0435 04/30/14 0424 05/04/14 0408  NA 139 139 140 140  K 4.4 4.4 4.1 3.8  CL 103 108 106 106  CO2 20 18* 20 23  GLUCOSE 88 95 100* 100*  BUN 25* 23 22 25*  CREATININE 2.40* 2.28* 2.27* 2.40*  CALCIUM 10.1 9.5 9.9 9.5   Liver Function  Tests:  Recent Labs Lab 04/28/14 1206  AST 27  ALT 25  ALKPHOS 96  BILITOT 0.3  PROT 6.8  ALBUMIN 3.6   CBC:  Recent Labs Lab 04/28/14 1206 04/29/14 0435 05/04/14 0408  WBC 5.8 4.6 4.4  NEUTROABS 4.7  --   --   HGB 10.7* 9.9* 10.1*  HCT 33.8* 31.5* 32.2*  MCV 96.8 98.1 99.4  PLT 179 191 151   CBG:  Recent Labs Lab 04/30/14 2053 05/01/14 0007  GLUCAP 90 96    Scheduled Meds: . ALPRAZolam  1 mg Oral TID  . clopidogrel  75 mg Oral q morning - 10a  . heparin  5,000 Units Subcutaneous 3 times per day  . lamoTRIgine  50 mg Oral BID  . metoprolol succinate  25 mg Oral q morning - 10a  . OLANZapine zydis  10 mg Oral QHS  . OLANZapine zydis  5 mg Oral q morning - 10a  . simvastatin  10 mg Oral QHS  . tamsulosin  0.4 mg Oral q morning - 10a  . traZODone  100 mg Oral QHS   Continuous Infusions:   Jeffrey Ramsay, MD  [facility] Pager [phone]  If 7PM-7AM, please contact night-coverage [email or web address] Password [email or web address] 05/04/2014, 11:29 AM   LOS: 6 days

## 2014-05-04 NOTE — Plan of Care (Signed)
Problem: Phase III Progression Outcomes Goal: Discharge plan remains appropriate-arrangements made Outcome: Adequate for Discharge On waiting list for a bed at [facility]

## 2014-05-04 NOTE — Progress Notes (Addendum)
Clinical Social Work  CSW went to room to meet with patient. Patient sleeping in room and sitter reports he has not been aggressive today. CSW spoke with wife via phone and updated her on DC plans. Wife reports she feels that patient is improving and is hopeful that patient will become less aggressive. CSW contacted the following facilities re: placement:  [facility]- left a message with admissions at 11:53am  [facility]- left a message with admissions at 11:50 am  [facility] confirmed with Marlowe Kays that patient remains on waiting list  [facility]- denied 7/16   [facility]-no available beds  [facility]- no available beds   [facility]- denied on 7/16   [facility]- no available beds (Addendum 1615: Admissions called and reports possible available beds. Hospital will review information and will call CSW if they are able to accept.)  [facility]- denied 7/17   [facility]- left message at 11:55 am  [facility]- no available beds  CSW informed MD of barriers to DC and agreeable to contact MD if bed becomes available.  [place], [facility] [phone]

## 2014-05-05 DIAGNOSIS — R4182 Altered mental status, unspecified: Secondary | ICD-10-CM | POA: Diagnosis not present

## 2014-05-05 DIAGNOSIS — F3160 Bipolar disorder, current episode mixed, unspecified: Secondary | ICD-10-CM | POA: Diagnosis not present

## 2014-05-05 LAB — BASIC METABOLIC PANEL
Anion gap: 13 (ref 5–15)
BUN: 27 mg/dL — ABNORMAL HIGH (ref 6–23)
CO2: 20 mEq/L (ref 19–32)
Calcium: 9.8 mg/dL (ref 8.4–10.5)
Chloride: 104 mEq/L (ref 96–112)
Creatinine, Ser: 2.3 mg/dL — ABNORMAL HIGH (ref 0.50–1.35)
GFR calc Af Amer: 33 mL/min — ABNORMAL LOW (ref >90)
GFR calc non Af Amer: 28 mL/min — ABNORMAL LOW (ref >90)
Glucose, Bld: 131 mg/dL — ABNORMAL HIGH (ref 70–99)
Potassium: 4.2 mEq/L (ref 3.7–5.3)
Sodium: 137 mEq/L (ref 137–147)

## 2014-05-05 LAB — CBC
HCT: 33.5 % — ABNORMAL LOW (ref 39.0–52.0)
Hemoglobin: 10.7 g/dL — ABNORMAL LOW (ref 13.0–17.0)
MCH: 31 pg (ref 26.0–34.0)
MCHC: 31.9 g/dL (ref 30.0–36.0)
MCV: 97.1 fL (ref 78.0–100.0)
Platelets: 145 10*3/uL — ABNORMAL LOW (ref 150–400)
RBC: 3.45 MIL/uL — ABNORMAL LOW (ref 4.22–5.81)
RDW: 14.1 % (ref 11.5–15.5)
WBC: 4.1 10*3/uL (ref 4.0–10.5)

## 2014-05-05 NOTE — Progress Notes (Signed)
Clinical Social Work  Patient's IVC paperwork expires today. MD signed updated forms which were faxed to [facility]. CSW called Magistrate who confirmed paperwork was received and they will serve patient.  [place], [place] [phone]

## 2014-05-05 NOTE — Consult Note (Signed)
[facility] Consult   Reason for Consult:  Bipolar disorder and altered mental status Referring Physician:  Jacquelynn Cree, MD DERICO MITTON is an 63 y.o. male. Total Time spent with patient: 16mnutes  Assessment: AXIS I:  Bipolar, mixed AXIS II:  Deferred AXIS III:   Past Medical History  Diagnosis Date  . Hyperlipemia   . Pacemaker   . Arrhythmia   . Diabetes insipidus   . History of kidney stones   . History of DVT (deep vein thrombosis)   . History of pulmonary embolism   . Bipolar 1 disorder   . Lithium toxicity   . Stage III chronic kidney disease    AXIS IV:  other psychosocial or environmental problems, problems related to social environment and problems with primary support group AXIS V:  31-40 impairment in reality testing  Plan:  Case will be discussed with HSindy Messing MD, and pending placement Recommend psychiatric Inpatient admission when medically cleared. Supportive therapy provided about ongoing stressors. Continue Zyprexa Zydis 10 mg at bedtime and 5 mg daily morning starting tomorrow, Increase Lamictal chewable 50 mg twice daily for controlling bipolar mania and may use haldol 5 mg IM or IV  along with Cogentin 1 mg Q8hours PRN for agitation and aggression. Monitor for side effects of the medications Psychiatric consultation followup as clinically required Appreciate psychiatric consultation Please contact [phone] if needs further assistance  Subjective:   DMADDOX HLAVATYis a 63y.o. male patient admitted with Bipolar disorder.  HPI:  Mr. LMcdiarmidis a 63yo male  [facility] this provider [facility]/al[facility]. [facility]. Patient has AKI and not recommended restarting lithium treatment. Patient has been irritable, angry and grandiose during this evaluation.  Interval  History: Patient seen today 05/05/14. Safety sitter is at bedside. Apparently he has been restless, distracted, has few inappropriate behaviors like frequently changing cloths and unsteady on his feet. His family visited him during weekend as per Dr. RHarrington Challenger He has one or two episodes of agitated and pushed sitter on 7/17 but has been compliant with meds and calmed down since then. He is quietly sleeping without distress during my visit. He is still somewhat confused, slurred and poverty of speech and has tangential and grandiose thoughts. He is not stable enough to go home and needs further psychiatric treatment.  Medical history: Patient with PMH of Bipolar type 1, CAD, chronic systolic CHF (TTE 32/2449with EF of 30-35%), complete HB with PM in place, CKD, DI, h/o DVT and PE (not on coumadin currently) who presents with increased confusion and AKI. Mr. LPalkawas alert, but not able to have a completely coherent conversation when I saw him. He was recently admitted to the hospital for Li toxicity, abnormal behavior and AKI and was discharged to a SNF. Today, he called 911 from the SNF stating that they were holding him hostage and he was brought to the ED. Mr. LModestodoes know that he is at the hospital, but is very tangential in thinking and reports that he has "somewhere" to be and cannot stay in the hospital. I called and spoke with his wife, [name] who noted that Mr. LArnaudhas struggled with Bipolar disorder. About 10 years ago, he had similar issues and was admitted to a psychiatric hospital and started on Lithium with good results. However, about 6  weeks ago, he began having bizarre and abnormal behavior again and was admitted with Nicoletta Dress toxicity. He is now on Zyprexa for his Bipolar disorder. Labs in the ED showed a mild worsening of his CKD to 2.4 (baseline 2) along with some likely dehydration with hemoconcentration on H/H. He was placed under IVC by the ED provider.   HPI Elements:   Location:  Bipolar  mixed mania. Quality:  Poor. Severity:  Moderate. Timing:  Unknown stressors.  Past Psychiatric History: Past Medical History  Diagnosis Date  . Hyperlipemia   . Pacemaker   . Arrhythmia   . Diabetes insipidus   . History of kidney stones   . History of DVT (deep vein thrombosis)   . History of pulmonary embolism   . Bipolar 1 disorder   . Lithium toxicity   . Stage III chronic kidney disease     reports that he has been smoking Cigars.  He has never used smokeless tobacco. He reports that he does not drink alcohol or use illicit drugs. Family History  Problem Relation Age of Onset  . Depression Mother          Abuse/Neglect [facility]) Physical Abuse: Denies Verbal Abuse: Denies Sexual Abuse: Denies Allergies:   Allergies  Allergen Reactions  . Aspirin Hives  . Codeine Nausea And Vomiting  . Demerol [Meperidine] Nausea And Vomiting    ACT Assessment Complete:  NO Objective: Blood pressure 117/87, pulse 67, temperature 98.7 F (37.1 C), temperature source Oral, resp. rate 16, height '5\' 11"'  (1.803 m), weight 68.947 kg (152 lb), SpO2 98.00%.Body mass index is 21.21 kg/(m^2). Results for orders placed during the hospital encounter of 04/28/14 (from the past 72 hour(s))  BASIC METABOLIC PANEL     Status: Abnormal   Collection Time    05/04/14  4:08 AM      Result Value Ref Range   Sodium 140  137 - 147 mEq/L   Potassium 3.8  3.7 - 5.3 mEq/L   Chloride 106  96 - 112 mEq/L   CO2 23  19 - 32 mEq/L   Glucose, Bld 100 (*) 70 - 99 mg/dL   BUN 25 (*) 6 - 23 mg/dL   Creatinine, Ser 2.40 (*) 0.50 - 1.35 mg/dL   Calcium 9.5  8.4 - 10.5 mg/dL   GFR calc non Af Amer 27 (*) >90 mL/min   GFR calc Af Amer 31 (*) >90 mL/min   Comment: (NOTE)     The eGFR has been calculated using the CKD EPI equation.     This calculation has not been validated in all clinical situations.     eGFR's persistently <90 mL/min signify possible Chronic Kidney     Disease.   Anion gap 11  5 - 15  CBC      Status: Abnormal   Collection Time    05/04/14  4:08 AM      Result Value Ref Range   WBC 4.4  4.0 - 10.5 K/uL   RBC 3.24 (*) 4.22 - 5.81 MIL/uL   Hemoglobin 10.1 (*) 13.0 - 17.0 g/dL   HCT 32.2 (*) 39.0 - 52.0 %   MCV 99.4  78.0 - 100.0 fL   MCH 31.2  26.0 - 34.0 pg   MCHC 31.4  30.0 - 36.0 g/dL   RDW 14.3  11.5 - 15.5 %   Platelets 151  150 - 400 K/uL  CBC     Status: Abnormal   Collection Time    05/05/14  12:09 AM      Result Value Ref Range   WBC 4.1  4.0 - 10.5 K/uL   RBC 3.45 (*) 4.22 - 5.81 MIL/uL   Hemoglobin 10.7 (*) 13.0 - 17.0 g/dL   HCT 33.5 (*) 39.0 - 52.0 %   MCV 97.1  78.0 - 100.0 fL   MCH 31.0  26.0 - 34.0 pg   MCHC 31.9  30.0 - 36.0 g/dL   RDW 14.1  11.5 - 15.5 %   Platelets 145 (*) 150 - 400 K/uL  BASIC METABOLIC PANEL     Status: Abnormal   Collection Time    05/05/14 12:09 AM      Result Value Ref Range   Sodium 137  137 - 147 mEq/L   Potassium 4.2  3.7 - 5.3 mEq/L   Chloride 104  96 - 112 mEq/L   CO2 20  19 - 32 mEq/L   Glucose, Bld 131 (*) 70 - 99 mg/dL   BUN 27 (*) 6 - 23 mg/dL   Creatinine, Ser 2.30 (*) 0.50 - 1.35 mg/dL   Calcium 9.8  8.4 - 10.5 mg/dL   GFR calc non Af Amer 28 (*) >90 mL/min   GFR calc Af Amer 33 (*) >90 mL/min   Comment: (NOTE)     The eGFR has been calculated using the CKD EPI equation.     This calculation has not been validated in all clinical situations.     eGFR's persistently <90 mL/min signify possible Chronic Kidney     Disease.   Anion gap 13  5 - 15   Labs are reviewed and are pertinent for .  Current Facility-Administered Medications  Medication Dose Route Frequency Provider Last Rate Last Dose  . acetaminophen (TYLENOL) tablet 650 mg  650 mg Oral Q6H PRN Sid Falcon, MD   650 mg at 04/29/14 0051   Or  . acetaminophen (TYLENOL) suppository 650 mg  650 mg Rectal Q6H PRN Sid Falcon, MD      . ALPRAZolam [name]) tablet 1 mg  1 mg Oral TID Sid Falcon, MD   1 mg at 05/04/14 2105  . clopidogrel  (PLAVIX) tablet 75 mg  75 mg Oral q morning - 10a Sid Falcon, MD   75 mg at 05/04/14 1037  . haloperidol (HALDOL) tablet 5 mg  5 mg Oral Q6H PRN Venetia Maxon Rama, MD       Or  . haloperidol lactate (HALDOL) injection 5 mg  5 mg Intramuscular Q6H PRN Venetia Maxon Rama, MD   5 mg at 05/01/14 1158  . heparin injection 5,000 Units  5,000 Units Subcutaneous 3 times per day Sid Falcon, MD   5,000 Units at 05/04/14 1405  . lamoTRIgine (LAMICTAL) tablet 50 mg  50 mg Oral BID Durward Parcel, MD   50 mg at 05/04/14 2105  . metoprolol succinate (TOPROL-XL) 24 hr tablet 25 mg  25 mg Oral q morning - 10a Sid Falcon, MD   25 mg at 05/04/14 1036  . OLANZapine zydis (ZYPREXA) disintegrating tablet 10 mg  10 mg Oral QHS Sid Falcon, MD   10 mg at 05/03/14 2146  . OLANZapine zydis (ZYPREXA) disintegrating tablet 5 mg  5 mg Oral q morning - 10a Durward Parcel, MD   5 mg at 05/04/14 1036  . simvastatin (ZOCOR) tablet 10 mg  10 mg Oral QHS Sid Falcon, MD   10 mg at 05/04/14 2106  .  tamsulosin (FLOMAX) capsule 0.4 mg  0.4 mg Oral q morning - 10a Sid Falcon, MD   0.4 mg at 05/04/14 1036  . traZODone (DESYREL) tablet 100 mg  100 mg Oral QHS Sid Falcon, MD   100 mg at 05/04/14 2106    Psychiatric Specialty Exam: Physical Exam  ROS  Blood pressure 117/87, pulse 67, temperature 98.7 F (37.1 C), temperature source Oral, resp. rate 16, height '5\' 11"'  (1.803 m), weight 68.947 kg (152 lb), SpO2 98.00%.Body mass index is 21.21 kg/(m^2).  General Appearance: Guarded  Eye Contact::  Fair  Speech:  Blocked and Clear and Coherent  Volume:  Normal  Mood: anxious  Affect:  Labile  Thought Process:  Disorganized, Irrelevant and Tangential  Orientation:  Full (Time, Place, and Person)  Thought Content:  WDL  Suicidal Thoughts:  No  Homicidal Thoughts:  No  Memory:  Immediate;   Fair  Judgement:  Impaired  Insight:  Lacking  Psychomotor Activity:  Restlessness  Concentration:   Fair  Recall:  Chinchilla: Fair  Akathisia:  NA  Handed:  Right  AIMS (if indicated):     Assets:  Communication Skills Desire for Improvement Financial Resources/Insurance Housing Intimacy Leisure Time Physical Health Resilience Social Support  Sleep:      Musculoskeletal: Strength & Muscle Tone: within normal limits Gait & Station: unable to stand Patient leans: N/A  Treatment Plan Summary: Daily contact with patient to assess and evaluate symptoms and progress in treatment Medication management Continue current medications and pursue inpatient psychiatric placement  Latiesha Harada,JANARDHAHA R. 05/05/2014 9:41 AM

## 2014-05-05 NOTE — Progress Notes (Signed)
Patient ID: Jeffrey Herring, male   DOB: 03-23-1951, 63 y.o.   MRN: [id number]  [facility] PROGRESS NOTE  Jeffrey Herring [id number] DOB: February 05, 1951 DOA: 04/28/2014 PCP: Ashok Norris, MD  Brief Narrative:   63 y.o. male with PMH of Bipolar type 1, CAD, chronic systolic CHF (TTE 123XX123 with EF of 30-35%), complete HB with PM in place, CKD, DI, h/o DVT and PE (not on coumadin currently) who was admitted 04/28/14 with recent confusion and acute kidney injury. Of note, he was recently hospitalized from 04/09/14-04/23/14 for treatment of lithium toxicity. He was subsequently discharged to a SNF. He called the police from his SNF, stating he was being "held hostage". Psychiatry has recommended inpatient psychiatric treatment. Awaiting placement.   Assessment/Plan:   Principal Problem:  Psychosis / bipolar 1 disorder  TSH and B12 levels WNL.  Currently being managed with Xanax, Haldol PRN, Zyprexa, Lamictal and Trazodone.  Has involuntary commitment papers on the chart.  Evaluated by psychiatrist 04/29/14, medications adjusted with increased Zyprexa and initiation of lamictal.  Remains intermittently agitated.  Placement to inpatient psych pending, pt is stable for discharge when bed available   Active Problems:  Normocytic anemia  B12 level WNL.  Iron/ferritin done during previous hospital stay and did not show any deficiencies.  Hg and Hct remain stable with no signs of active bleeding  Coronary artery disease  Continue Plavix, metoprolol, and Zocor. Cardiac pacemaker in situ  Acute renal failure in the setting of stage III chronic kidney disease  Renal ultrasound done 7/15, echogenic parenchyma but no hydronephrosis or renal obstruction.  Baseline creatinine 1.9-2. Cr is trending down  Repeat BMP in AM Chronic systolic CHF (congestive heart failure)  Status post 2-D echo 01/08/14: EF 30-35%.  Currently compensated.  Stopped IVF 07/20 to avoid volume overload  DVT Prophylaxis   Continue subcutaneous heparin while inpatient  Code Status: Full.  Family Communication: no family at bedside  Disposition Plan: Psychiatric facility when bed available.   IV Access:   Peripheral IV Procedures and diagnostic studies:   04/28/14: Chest x-ray: No acute cardiopulmonary abnormality. Subsegmental atelectasis.  04/28/14: CT of the head: No acute intracranial process. Encephalomalacia in the right posterior parietal/occipital lobe.  04/29/14: Renal ultrasound: Slightly echogenic renal parenchyma. No hydronephrosis or renal obstruction. Medical Consultants:   Durward Parcel, MD, Psychiatry Other Consultants:   None. Anti-Infectives:   None.  Leisa Lenz, MD  [facility] Pager [phone]  If 7PM-7AM, please contact night-coverage [email or web address] Password [email or web address] 05/05/2014, 10:00 AM   LOS: 7 days   HPI/Subjective: No acute overnight events.  Objective: Filed Vitals:   05/04/14 0459 05/04/14 1434 05/04/14 2140 05/05/14 0512  BP: 109/68 114/55 120/58 117/87  Pulse: 61 65 62 67  Temp: 96.7 F (35.9 C) 97.8 F (36.6 C) 98.4 F (36.9 C) 98.7 F (37.1 C)  TempSrc: Axillary Axillary Oral Oral  Resp: 16 16 16 16   Height:      Weight:      SpO2: 96% 98% 98% 98%    Intake/Output Summary (Last 24 hours) at 05/05/14 1000 Last data filed at 05/05/14 [id number]  Gross per 24 hour  Intake   2502 ml  Output      8 ml  Net   2494 ml    Exam:   General:  Pt is alert, still confused  Cardiovascular: Regular rate and rhythm, S1/S2, no murmurs  Respiratory: Clear to auscultation bilaterally, no wheezing, no crackles, no rhonchi  Abdomen: Soft, non tender, non  distended, bowel sounds present  Extremities: No edema, pulses DP and PT palpable bilaterally   Data Reviewed: Basic Metabolic Panel:  Recent Labs Lab 04/28/14 1206 04/29/14 0435 04/30/14 0424 05/04/14 0408 05/05/14 0009  NA 139 139 140 140 137  K 4.4 4.4 4.1 3.8 4.2  CL 103 108 106 106  104  CO2 20 18* 20 23 20   GLUCOSE 88 95 100* 100* 131*  BUN 25* 23 22 25* 27*  CREATININE 2.40* 2.28* 2.27* 2.40* 2.30*  CALCIUM 10.1 9.5 9.9 9.5 9.8   Liver Function Tests:  Recent Labs Lab 04/28/14 1206  AST 27  ALT 25  ALKPHOS 96  BILITOT 0.3  PROT 6.8  ALBUMIN 3.6    CBC:  Recent Labs Lab 04/28/14 1206 04/29/14 0435 05/04/14 0408 05/05/14 0009  WBC 5.8 4.6 4.4 4.1  NEUTROABS 4.7  --   --   --   HGB 10.7* 9.9* 10.1* 10.7*  HCT 33.8* 31.5* 32.2* 33.5*  MCV 96.8 98.1 99.4 97.1  PLT 179 191 151 145*   CBG:  Recent Labs Lab 04/30/14 2053 05/01/14 0007  GLUCAP 90 96   Studies: No results found.  Scheduled Meds: . ALPRAZolam  1 mg Oral TID  . clopidogrel  75 mg Oral q morning - 10a  . heparin  5,000 Units Subcutaneous 3 times per day  . lamoTRIgine  50 mg Oral BID  . metoprolol succinate  25 mg Oral q morning - 10a  . OLANZapine zydis  10 mg Oral QHS  . OLANZapine zydis  5 mg Oral q morning - 10a  . simvastatin  10 mg Oral QHS  . tamsulosin  0.4 mg Oral q morning - 10a  . traZODone  100 mg Oral QHS   Continuous Infusions:

## 2014-05-05 NOTE — Progress Notes (Signed)
UR completed 

## 2014-05-06 DIAGNOSIS — R4182 Altered mental status, unspecified: Secondary | ICD-10-CM | POA: Diagnosis not present

## 2014-05-06 DIAGNOSIS — F3160 Bipolar disorder, current episode mixed, unspecified: Secondary | ICD-10-CM | POA: Diagnosis not present

## 2014-05-06 DIAGNOSIS — Z950 Presence of cardiac pacemaker: Secondary | ICD-10-CM

## 2014-05-06 DIAGNOSIS — N179 Acute kidney failure, unspecified: Secondary | ICD-10-CM | POA: Diagnosis not present

## 2014-05-06 DIAGNOSIS — F319 Bipolar disorder, unspecified: Secondary | ICD-10-CM | POA: Diagnosis not present

## 2014-05-06 LAB — BASIC METABOLIC PANEL
Anion gap: 15 (ref 5–15)
BUN: 27 mg/dL — ABNORMAL HIGH (ref 6–23)
CO2: 20 mEq/L (ref 19–32)
Calcium: 9.9 mg/dL (ref 8.4–10.5)
Chloride: 105 mEq/L (ref 96–112)
Creatinine, Ser: 2.65 mg/dL — ABNORMAL HIGH (ref 0.50–1.35)
GFR calc Af Amer: 28 mL/min — ABNORMAL LOW (ref >90)
GFR calc non Af Amer: 24 mL/min — ABNORMAL LOW (ref >90)
Glucose, Bld: 112 mg/dL — ABNORMAL HIGH (ref 70–99)
Potassium: 3.9 mEq/L (ref 3.7–5.3)
Sodium: 140 mEq/L (ref 137–147)

## 2014-05-06 LAB — CBC
HCT: 34.8 % — ABNORMAL LOW (ref 39.0–52.0)
Hemoglobin: 10.8 g/dL — ABNORMAL LOW (ref 13.0–17.0)
MCH: 30.5 pg (ref 26.0–34.0)
MCHC: 31 g/dL (ref 30.0–36.0)
MCV: 98.3 fL (ref 78.0–100.0)
Platelets: 140 10*3/uL — ABNORMAL LOW (ref 150–400)
RBC: 3.54 MIL/uL — ABNORMAL LOW (ref 4.22–5.81)
RDW: 13.9 % (ref 11.5–15.5)
WBC: 4.1 10*3/uL (ref 4.0–10.5)

## 2014-05-06 MED ORDER — LAMOTRIGINE 25 MG PO TABS
75.0000 mg | ORAL_TABLET | Freq: Two times a day (BID) | ORAL | Status: DC
  Administered 2014-05-06 – 2014-05-07 (×2): 75 mg via ORAL
  Filled 2014-05-06 (×3): qty 3

## 2014-05-06 MED ORDER — OLANZAPINE 5 MG PO TBDP
15.0000 mg | ORAL_TABLET | Freq: Every day | ORAL | Status: DC
  Administered 2014-05-06 – 2014-05-10 (×5): 15 mg via ORAL
  Filled 2014-05-06 (×6): qty 1

## 2014-05-06 MED ORDER — ALPRAZOLAM 0.5 MG PO TABS
0.5000 mg | ORAL_TABLET | Freq: Three times a day (TID) | ORAL | Status: DC
  Administered 2014-05-06 – 2014-05-11 (×12): 0.5 mg via ORAL
  Filled 2014-05-06 (×13): qty 1

## 2014-05-06 MED ORDER — SODIUM CHLORIDE 0.9 % IV SOLN
INTRAVENOUS | Status: AC
  Administered 2014-05-06 – 2014-05-07 (×3): via INTRAVENOUS

## 2014-05-06 NOTE — Progress Notes (Addendum)
Patient ID: Jeffrey Herring, male   DOB: [birth year], 63 y.o.   MRN: [id number] [facility] PROGRESS NOTE  MYKLE MATZEN [id number] DOB: [birth year] DOA: 04/28/2014 PCP: Ashok Norris, MD  Brief narrative: 63 y.o. male with PMH of Bipolar type 1, CAD, chronic systolic CHF (TTE 123XX123 with EF of 30-35%), complete HB with PM in place, CKD, DI, h/o DVT and PE (not on coumadin currently) who was admitted 04/28/14 with recent confusion and acute kidney injury. Of note, he was recently hospitalized from 04/09/14-04/23/14 for treatment of lithium toxicity. He was subsequently discharged to a SNF. He called the police from his SNF, stating he was being "held hostage". Psychiatry has recommended inpatient psychiatric treatment. Awaiting placement.   Assessment and Plan:  Principal Problem:  Psychosis / bipolar 1 disorder  TSH and B12 levels WNL.  Continue Xanax, Haldol PRN, Zyprexa, Lamictal and Trazodone.  Has involuntary commitment papers on the chart.  Evaluated by psychiatrist 04/29/14, medications adjusted with increased Zyprexa and initiation of lamictal.   Active Problems:  Anemia of chronic disease / anemia of renal disease B12 level WNL.  Hemoglobin is stable. No current indications for transfusion.  Coronary artery disease  Continue Plavix, metoprolol, and Zocor. Cardiac pacemaker in situ  Chronic kidney disease, stage 4 Renal ultrasound done 7/15, echogenic parenchyma but no hydronephrosis or renal obstruction.  Baseline creatinine around 3 in 03/2014. Creatinine now 2.65, around baseline values Chronic systolic CHF (congestive heart failure)  Status post 2-D echo 01/08/14: EF 30-35%.  Currently compensated.  Stopped IVF 07/20 to avoid volume overload  DVT Prophylaxis  Use SCD's due to slight thrombocytopenia   Code Status: Full.  Family Communication: no family at bedside  Disposition Plan: Psychiatric facility when bed available.    IV Access:   Peripheral  IV  Leisa Lenz, MD  [facility] Pager [phone]  If 7PM-7AM, please contact night-coverage [email or web address] Password [email or web address] 05/06/2014, 2:24 PM   LOS: 8 days   Consultants:  Psychiatry (Dr. Ardath Sax)  Procedures: 04/28/14: Chest x-ray: No acute cardiopulmonary abnormality. Subsegmental atelectasis.  04/28/14: CT of the head: No acute intracranial process. Encephalomalacia in the right posterior parietal/occipital lobe.  04/29/14: Renal ultrasound: Slightly echogenic renal parenchyma. No hydronephrosis or renal obstruction.  Antibiotics:  None   HPI/Subjective: No acute overnight events.  Objective: Filed Vitals:   05/05/14 0512 05/05/14 1409 05/05/14 2101 05/06/14 0627  BP: 117/87 105/55 121/59 105/68  Pulse: 67 62 65 60  Temp: 98.7 F (37.1 C) 98 F (36.7 C) 97.1 F (36.2 C) 97.4 F (36.3 C)  TempSrc: Oral  Oral Oral  Resp: 16 18 18 17   Height:      Weight:      SpO2: 98% 95% 99% 95%    Intake/Output Summary (Last 24 hours) at 05/06/14 1424 Last data filed at 05/06/14 1300  Gross per 24 hour  Intake   1080 ml  Output      8 ml  Net   1072 ml    Exam:   General:  Pt is alert, follows commands appropriately, not in acute distress  Cardiovascular: Regular rate and rhythm, S1/S2, no murmurs  Respiratory: Clear to auscultation bilaterally, no wheezing, no crackles, no rhonchi  Abdomen: Soft, non tender, non distended, bowel sounds present  Extremities: No edema, pulses DP and PT palpable bilaterally  Neuro: Grossly nonfocal  Data Reviewed: Basic Metabolic Panel:  Recent Labs Lab 04/30/14 0424 05/04/14 0408 05/05/14 0009 05/06/14 0455  NA 140 140 137 140  K 4.1 3.8 4.2 3.9  CL 106 106 104 105  CO2 20 23 20 20   GLUCOSE 100* 100* 131* 112*  BUN 22 25* 27* 27*  CREATININE 2.27* 2.40* 2.30* 2.65*  CALCIUM 9.9 9.5 9.8 9.9   Liver Function Tests: No results found for this basename: AST, ALT, ALKPHOS, BILITOT, PROT, ALBUMIN,   in the last 168 hours No results found for this basename: LIPASE, AMYLASE,  in the last 168 hours No results found for this basename: AMMONIA,  in the last 168 hours CBC:  Recent Labs Lab 05/04/14 0408 05/05/14 0009 05/06/14 0455  WBC 4.4 4.1 4.1  HGB 10.1* 10.7* 10.8*  HCT 32.2* 33.5* 34.8*  MCV 99.4 97.1 98.3  PLT 151 145* 140*   Cardiac Enzymes: No results found for this basename: CKTOTAL, CKMB, CKMBINDEX, TROPONINI,  in the last 168 hours BNP: No components found with this basename: POCBNP,  CBG:  Recent Labs Lab 04/30/14 2053 05/01/14 0007  GLUCAP 90 96    No results found for this or any previous visit (from the past 240 hour(s)).   Studies: No results found.  Scheduled Meds: . ALPRAZolam  0.5 mg Oral TID  . clopidogrel  75 mg Oral q morning - 10a  . heparin  5,000 Units Subcutaneous 3 times per day  . lamoTRIgine  75 mg Oral BID  . metoprolol succinate  25 mg Oral q morning - 10a  . OLANZapine zydis  15 mg Oral QHS  . simvastatin  10 mg Oral QHS  . tamsulosin  0.4 mg Oral q morning - 10a  . traZODone  100 mg Oral QHS   Continuous Infusions: . sodium chloride 75 mL/hr at 05/06/14 1212

## 2014-05-06 NOTE — Consult Note (Signed)
[facility] Consult   Reason for Consult:  Bipolar disorder and altered mental status Referring Physician:  Jacquelynn Cree, MD LEEON Herring is an 63 y.o. male. Total Time spent with patient: 44mnutes  Assessment: AXIS I:  Bipolar, mixed AXIS II:  Deferred AXIS III:   Past Medical History  Diagnosis Date  . Hyperlipemia   . Pacemaker   . Arrhythmia   . Diabetes insipidus   . History of kidney stones   . History of DVT (deep vein thrombosis)   . History of pulmonary embolism   . Bipolar 1 disorder   . Lithium toxicity   . Stage III chronic kidney disease    AXIS IV:  other psychosocial or environmental problems, problems related to social environment and problems with primary support group AXIS V:  31-40 impairment in reality testing  Plan:  Case will be discussed with HSindy Messing MD, and pending placement Recommend psychiatric Inpatient admission when medically cleared. Supportive therapy provided about ongoing stressors. Change Zyprexa Zydis 15 mg at 8 PM and increase Lamictal chewable 75 mg twice daily for controlling bipolar mania and may use haldol 5 mg IM or IV  along with Cogentin 1 mg Q8hours PRN for agitation and aggression. Monitor for side effects of the medications Psychiatric consultation followup as clinically required Appreciate psychiatric consultation Please contact [phone] if needs further assistance  Subjective:   Jeffrey HUMANis a 63y.o. male patient admitted with Bipolar disorder.  HPI:  Mr. LNoahis a 63yo male  [facility] provider [facility]. Patient has AKI and not recommended restarting lithium treatment. Patient has been irritable, angry and grandiose during this evaluation.  Interval History: Patient appeared in his bed lying down,  somewhat sedated and has slurred speech. Safety sitter is at bedside who said that patient was not able to function at this time. He has been restless, distracted, inappropriate behaviors and unsteady on his feet. He has no reported agitation and aggressive behaviors. He is somewhat confused, slurred and has poverty of speech, tangential and grandiose thoughts. He is not stable enough to go home and needs further psychiatric treatment.  Medical history: Patient with PMH of Bipolar type 1, CAD, chronic systolic CHF (TTE 38/0998with EF of 30-35%), complete HB with PM in place, CKD, DI, h/o DVT and PE (not on coumadin currently) who presents with increased confusion and AKI. Mr. LGingwas alert, but not able to have a completely coherent conversation when I saw him. He was recently admitted to the hospital for Li toxicity, abnormal behavior and AKI and was discharged to a SNF. Today, he called 911 from the SNF stating that they were holding him hostage and he was brought to the ED. Mr. LBiskupdoes know that he is at the hospital, but is very tangential in thinking and reports that he has "somewhere" to be and cannot stay in the hospital. I called and spoke with his wife, Jeffrey Herring who noted that Mr. LStoudthas struggled with Bipolar disorder. About 10 years ago, he had similar issues and was admitted to a psychiatric hospital and started on Lithium with good results. However, about 6 weeks ago, he began having bizarre and abnormal behavior again and was admitted with LNicoletta Dresstoxicity. He is now on Zyprexa for his Bipolar disorder. Labs in the  ED showed a mild worsening of his CKD to 2.4 (baseline 2) along with some likely dehydration with hemoconcentration on H/H. He was placed under IVC by the ED provider.   HPI Elements:   Location:  Bipolar mixed mania. Quality:  Poor. Severity:  Moderate. Timing:  Unknown stressors.  Past Psychiatric History: Past Medical History  Diagnosis Date  . Hyperlipemia   .  Pacemaker   . Arrhythmia   . Diabetes insipidus   . History of kidney stones   . History of DVT (deep vein thrombosis)   . History of pulmonary embolism   . Bipolar 1 disorder   . Lithium toxicity   . Stage III chronic kidney disease     reports that he has been smoking Cigars.  He has never used smokeless tobacco. He reports that he does not drink alcohol or use illicit drugs. Family History  Problem Relation Age of Onset  . Depression Mother          Abuse/Neglect [facility]) Physical Abuse: Denies Verbal Abuse: Denies Sexual Abuse: Denies Allergies:   Allergies  Allergen Reactions  . Aspirin Hives  . Codeine Nausea And Vomiting  . Demerol [Meperidine] Nausea And Vomiting    ACT Assessment Complete:  NO Objective: Blood pressure 105/68, pulse 60, temperature 97.4 F (36.3 C), temperature source Oral, resp. rate 17, height 5' 11" (1.803 m), weight 68.947 kg (152 lb), SpO2 95.00%.Body mass index is 21.21 kg/(m^2). Results for orders placed during the hospital encounter of 04/28/14 (from the past 72 hour(s))  BASIC METABOLIC PANEL     Status: Abnormal   Collection Time    05/04/14  4:08 AM      Result Value Ref Range   Sodium 140  137 - 147 mEq/L   Potassium 3.8  3.7 - 5.3 mEq/L   Chloride 106  96 - 112 mEq/L   CO2 23  19 - 32 mEq/L   Glucose, Bld 100 (*) 70 - 99 mg/dL   BUN 25 (*) 6 - 23 mg/dL   Creatinine, Ser 2.40 (*) 0.50 - 1.35 mg/dL   Calcium 9.5  8.4 - 10.5 mg/dL   GFR calc non Af Amer 27 (*) >90 mL/min   GFR calc Af Amer 31 (*) >90 mL/min   Comment: (NOTE)     The eGFR has been calculated using the CKD EPI equation.     This calculation has not been validated in all clinical situations.     eGFR's persistently <90 mL/min signify possible Chronic Kidney     Disease.   Anion gap 11  5 - 15  CBC     Status: Abnormal   Collection Time    05/04/14  4:08 AM      Result Value Ref Range   WBC 4.4  4.0 - 10.5 K/uL   RBC 3.24 (*) 4.22 - 5.81 MIL/uL   Hemoglobin  10.1 (*) 13.0 - 17.0 g/dL   HCT 32.2 (*) 39.0 - 52.0 %   MCV 99.4  78.0 - 100.0 fL   MCH 31.2  26.0 - 34.0 pg   MCHC 31.4  30.0 - 36.0 g/dL   RDW 14.3  11.5 - 15.5 %   Platelets 151  150 - 400 K/uL  CBC     Status: Abnormal   Collection Time    05/05/14 12:09 AM      Result Value Ref Range   WBC 4.1  4.0 - 10.5 K/uL   RBC 3.45 (*) 4.22 - 5.81  MIL/uL   Hemoglobin 10.7 (*) 13.0 - 17.0 g/dL   HCT 33.5 (*) 39.0 - 52.0 %   MCV 97.1  78.0 - 100.0 fL   MCH 31.0  26.0 - 34.0 pg   MCHC 31.9  30.0 - 36.0 g/dL   RDW 14.1  11.5 - 15.5 %   Platelets 145 (*) 150 - 400 K/uL  BASIC METABOLIC PANEL     Status: Abnormal   Collection Time    05/05/14 12:09 AM      Result Value Ref Range   Sodium 137  137 - 147 mEq/L   Potassium 4.2  3.7 - 5.3 mEq/L   Chloride 104  96 - 112 mEq/L   CO2 20  19 - 32 mEq/L   Glucose, Bld 131 (*) 70 - 99 mg/dL   BUN 27 (*) 6 - 23 mg/dL   Creatinine, Ser 2.30 (*) 0.50 - 1.35 mg/dL   Calcium 9.8  8.4 - 10.5 mg/dL   GFR calc non Af Amer 28 (*) >90 mL/min   GFR calc Af Amer 33 (*) >90 mL/min   Comment: (NOTE)     The eGFR has been calculated using the CKD EPI equation.     This calculation has not been validated in all clinical situations.     eGFR's persistently <90 mL/min signify possible Chronic Kidney     Disease.   Anion gap 13  5 - 15  BASIC METABOLIC PANEL     Status: Abnormal   Collection Time    05/06/14  4:55 AM      Result Value Ref Range   Sodium 140  137 - 147 mEq/L   Potassium 3.9  3.7 - 5.3 mEq/L   Chloride 105  96 - 112 mEq/L   CO2 20  19 - 32 mEq/L   Glucose, Bld 112 (*) 70 - 99 mg/dL   BUN 27 (*) 6 - 23 mg/dL   Creatinine, Ser 2.65 (*) 0.50 - 1.35 mg/dL   Calcium 9.9  8.4 - 10.5 mg/dL   GFR calc non Af Amer 24 (*) >90 mL/min   GFR calc Af Amer 28 (*) >90 mL/min   Comment: (NOTE)     The eGFR has been calculated using the CKD EPI equation.     This calculation has not been validated in all clinical situations.     eGFR's persistently <90  mL/min signify possible Chronic Kidney     Disease.   Anion gap 15  5 - 15  CBC     Status: Abnormal   Collection Time    05/06/14  4:55 AM      Result Value Ref Range   WBC 4.1  4.0 - 10.5 K/uL   RBC 3.54 (*) 4.22 - 5.81 MIL/uL   Hemoglobin 10.8 (*) 13.0 - 17.0 g/dL   HCT 34.8 (*) 39.0 - 52.0 %   MCV 98.3  78.0 - 100.0 fL   MCH 30.5  26.0 - 34.0 pg   MCHC 31.0  30.0 - 36.0 g/dL   RDW 13.9  11.5 - 15.5 %   Platelets 140 (*) 150 - 400 K/uL   Labs are reviewed and are pertinent for .  Current Facility-Administered Medications  Medication Dose Route Frequency Provider Last Rate Last Dose  . 0.9 %  sodium chloride infusion   Intravenous Continuous Robbie Lis, MD 75 mL/hr at 05/06/14 1212    . acetaminophen (TYLENOL) tablet 650 mg  650 mg Oral Q6H PRN Raquel Sarna  Marsh Dolly, MD   650 mg at 04/29/14 0051   Or  . acetaminophen (TYLENOL) suppository 650 mg  650 mg Rectal Q6H PRN Sid Falcon, MD      . ALPRAZolam [name]) tablet 1 mg  1 mg Oral TID Sid Falcon, MD   1 mg at 05/06/14 1059  . clopidogrel (PLAVIX) tablet 75 mg  75 mg Oral q morning - 10a Sid Falcon, MD   75 mg at 05/06/14 1059  . haloperidol (HALDOL) tablet 5 mg  5 mg Oral Q6H PRN Venetia Maxon Rama, MD       Or  . haloperidol lactate (HALDOL) injection 5 mg  5 mg Intramuscular Q6H PRN Venetia Maxon Rama, MD   5 mg at 05/01/14 1158  . heparin injection 5,000 Units  5,000 Units Subcutaneous 3 times per day Sid Falcon, MD   5,000 Units at 05/05/14 1430  . lamoTRIgine (LAMICTAL) tablet 50 mg  50 mg Oral BID Durward Parcel, MD   50 mg at 05/06/14 1059  . metoprolol succinate (TOPROL-XL) 24 hr tablet 25 mg  25 mg Oral q morning - 10a Sid Falcon, MD   25 mg at 05/06/14 1058  . OLANZapine zydis (ZYPREXA) disintegrating tablet 10 mg  10 mg Oral QHS Sid Falcon, MD   10 mg at 05/05/14 2310  . OLANZapine zydis (ZYPREXA) disintegrating tablet 5 mg  5 mg Oral q morning - 10a Durward Parcel, MD   5 mg at  05/06/14 1058  . simvastatin (ZOCOR) tablet 10 mg  10 mg Oral QHS Sid Falcon, MD   10 mg at 05/05/14 2114  . tamsulosin (FLOMAX) capsule 0.4 mg  0.4 mg Oral q morning - 10a Sid Falcon, MD   0.4 mg at 05/06/14 1100  . traZODone (DESYREL) tablet 100 mg  100 mg Oral QHS Sid Falcon, MD   100 mg at 05/05/14 2114    Psychiatric Specialty Exam: Physical Exam  ROS  Blood pressure 105/68, pulse 60, temperature 97.4 F (36.3 C), temperature source Oral, resp. rate 17, height 5' 11" (1.803 m), weight 68.947 kg (152 lb), SpO2 95.00%.Body mass index is 21.21 kg/(m^2).  General Appearance: Guarded  Eye Contact::  Fair  Speech:  Blocked and Clear and Coherent  Volume:  Normal  Mood: anxious  Affect:  Labile  Thought Process:  Disorganized, Irrelevant and Tangential  Orientation:  Full (Time, Place, and Person)  Thought Content:  WDL  Suicidal Thoughts:  No  Homicidal Thoughts:  No  Memory:  Immediate;   Fair  Judgement:  Impaired  Insight:  Lacking  Psychomotor Activity:  Restlessness  Concentration:  Fair  Recall:  [place]: Fair  Akathisia:  NA  Handed:  Right  AIMS (if indicated):     Assets:  Communication Skills Desire for Improvement Financial Resources/Insurance Housing Intimacy Leisure Time Physical Health Resilience Social Support  Sleep:      Musculoskeletal: Strength & Muscle Tone: within normal limits Gait & Station: unable to stand Patient leans: N/A  Treatment Plan Summary: Daily contact with patient to assess and evaluate symptoms and progress in treatment Medication management Changes Zyprexa Zydis 15 mg daily 8 PM to reduce the sedation and increase Lamictal 75 mg 2 times a day for controlling the mania and also decrease Xanax to 0.5 mg 3 times daily for anxiety.  [facility] Patient  involuntary commitment documentation is up dated as per social  service  ,JANARDHAHA R. 05/06/2014 12:53 PM

## 2014-05-07 DIAGNOSIS — F3160 Bipolar disorder, current episode mixed, unspecified: Secondary | ICD-10-CM | POA: Diagnosis not present

## 2014-05-07 DIAGNOSIS — N179 Acute kidney failure, unspecified: Secondary | ICD-10-CM | POA: Diagnosis not present

## 2014-05-07 DIAGNOSIS — F319 Bipolar disorder, unspecified: Secondary | ICD-10-CM | POA: Diagnosis not present

## 2014-05-07 DIAGNOSIS — R4182 Altered mental status, unspecified: Secondary | ICD-10-CM | POA: Diagnosis not present

## 2014-05-07 DIAGNOSIS — F29 Unspecified psychosis not due to a substance or known physiological condition: Secondary | ICD-10-CM | POA: Diagnosis not present

## 2014-05-07 LAB — CBC
HCT: 31.2 % — ABNORMAL LOW (ref 39.0–52.0)
Hemoglobin: 9.8 g/dL — ABNORMAL LOW (ref 13.0–17.0)
MCH: 30.9 pg (ref 26.0–34.0)
MCHC: 31.4 g/dL (ref 30.0–36.0)
MCV: 98.4 fL (ref 78.0–100.0)
Platelets: 126 10*3/uL — ABNORMAL LOW (ref 150–400)
RBC: 3.17 MIL/uL — ABNORMAL LOW (ref 4.22–5.81)
RDW: 14.1 % (ref 11.5–15.5)
WBC: 3.9 10*3/uL — ABNORMAL LOW (ref 4.0–10.5)

## 2014-05-07 LAB — BASIC METABOLIC PANEL
Anion gap: 11 (ref 5–15)
BUN: 24 mg/dL — ABNORMAL HIGH (ref 6–23)
CO2: 21 mEq/L (ref 19–32)
Calcium: 9.4 mg/dL (ref 8.4–10.5)
Chloride: 108 mEq/L (ref 96–112)
Creatinine, Ser: 2.37 mg/dL — ABNORMAL HIGH (ref 0.50–1.35)
GFR calc Af Amer: 32 mL/min — ABNORMAL LOW (ref >90)
GFR calc non Af Amer: 27 mL/min — ABNORMAL LOW (ref >90)
Glucose, Bld: 90 mg/dL (ref 70–99)
Potassium: 4.2 mEq/L (ref 3.7–5.3)
Sodium: 140 mEq/L (ref 137–147)

## 2014-05-07 MED ORDER — LAMOTRIGINE 100 MG PO TABS
100.0000 mg | ORAL_TABLET | Freq: Two times a day (BID) | ORAL | Status: DC
  Administered 2014-05-07 – 2014-05-11 (×8): 100 mg via ORAL
  Filled 2014-05-07 (×9): qty 1

## 2014-05-07 MED ORDER — RISPERIDONE 0.5 MG PO TBDP
0.5000 mg | ORAL_TABLET | Freq: Every day | ORAL | Status: DC
  Administered 2014-05-07 – 2014-05-10 (×4): 0.5 mg via ORAL
  Filled 2014-05-07 (×7): qty 1

## 2014-05-07 MED ORDER — TRAZODONE HCL 150 MG PO TABS
150.0000 mg | ORAL_TABLET | Freq: Every day | ORAL | Status: DC
  Administered 2014-05-07 – 2014-05-10 (×4): 150 mg via ORAL
  Filled 2014-05-07 (×6): qty 1

## 2014-05-07 NOTE — Progress Notes (Signed)
Clinical Social Work  CSW spoke with [facility] and confirmed that patient remains on the waiting list. CSW will continue to follow.  [place],  [phone]

## 2014-05-07 NOTE — Consult Note (Signed)
[facility] Consult   Reason for Consult:  Bipolar disorder and altered mental status Referring Physician:  Jacquelynn Cree, MD RHYLAN KAGEL is an 63 y.o. male. Total Time spent with patient: 88mnutes  Assessment: AXIS I:  Bipolar, mixed AXIS II:  Deferred AXIS III:   Past Medical History  Diagnosis Date  . Hyperlipemia   . Pacemaker   . Arrhythmia   . Diabetes insipidus   . History of kidney stones   . History of DVT (deep vein thrombosis)   . History of pulmonary embolism   . Bipolar 1 disorder   . Lithium toxicity   . Stage III chronic kidney disease    AXIS IV:  other psychosocial or environmental problems, problems related to social environment and problems with primary support group AXIS V:  31-40 impairment in reality testing  Plan:  Case will be discussed with HSindy Messing MD, and pending placement Recommend psychiatric Inpatient admission when medically cleared. Supportive therapy provided about ongoing stressors. Continue Zyprexa Zydis 15 mg at 8 PM and increase Lamictal 100 mg twice daily, trazodone 150 mg and add risperidone 0.5 mg at bedtime for controlling bipolar mania and may use haldol 5 mg IM or IV  along with Cogentin 1 mg Q8hours PRN for agitation and aggression. Monitor for side effects of the medications Psychiatric consultation followup as clinically required Appreciate psychiatric consultation Please contact [phone] if needs further assistance  Subjective:   DTEOMAN GIRAUDis a 63y.o. male patient admitted with Bipolar disorder.  HPI:  Mr. LFowlesis a 63yo male  [facility] this provider [facility]. Patient has AKI and not recommended restarting lithium treatment. Patient has been irritable, angry and grandiose during this evaluation.  Interval  History: Patient appeared in his bed lying down, somewhat sedated and has slurred speech. Safety sitter is at bedside who said that patient was not able to function at this time and has visual hallucinations and seeing snakes on the floor and not sleeping well at nighttime. He has been restless, distracted, inappropriate behaviors. He has no reported agitation and aggressive behaviors. He is confused, slurred and has poverty of speech, tangential and grandiose thoughts. He is not stable enough to go home and needs further psychiatric treatment.  Medical history: Patient with PMH of Bipolar type 1, CAD, chronic systolic CHF (TTE 30/1093with EF of 30-35%), complete HB with PM in place, CKD, DI, h/o DVT and PE (not on coumadin currently) who presents with increased confusion and AKI. Mr. LMouncewas alert, but not able to have a completely coherent conversation when I saw him. He was recently admitted to the hospital for Li toxicity, abnormal behavior and AKI and was discharged to a SNF. Today, he called 911 from the SNF stating that they were holding him hostage and he was brought to the ED. Mr. LFotheringhamdoes know that he is at the hospital, but is very tangential in thinking and reports that he has "somewhere" to be and cannot stay in the hospital. I called and spoke with his wife, [name] who noted that Mr. LHaubnerhas struggled with Bipolar disorder. About 10 years ago, he had similar issues and was admitted to a psychiatric hospital and started on Lithium with good results. However, about 6 weeks ago, he began having bizarre and abnormal behavior  again and was admitted with Nicoletta Dress toxicity. He is now on Zyprexa for his Bipolar disorder. Labs in the ED showed a mild worsening of his CKD to 2.4 (baseline 2) along with some likely dehydration with hemoconcentration on H/H. He was placed under IVC by the ED provider.   HPI Elements:   Location:  Bipolar mixed mania. Quality:  Poor. Severity:  Moderate. Timing:   Unknown stressors.  Past Psychiatric History: Past Medical History  Diagnosis Date  . Hyperlipemia   . Pacemaker   . Arrhythmia   . Diabetes insipidus   . History of kidney stones   . History of DVT (deep vein thrombosis)   . History of pulmonary embolism   . Bipolar 1 disorder   . Lithium toxicity   . Stage III chronic kidney disease     reports that he has been smoking Cigars.  He has never used smokeless tobacco. He reports that he does not drink alcohol or use illicit drugs. Family History  Problem Relation Age of Onset  . Depression Mother          Abuse/Neglect [facility]) Physical Abuse: Denies Verbal Abuse: Denies Sexual Abuse: Denies Allergies:   Allergies  Allergen Reactions  . Aspirin Hives  . Codeine Nausea And Vomiting  . Demerol [Meperidine] Nausea And Vomiting    ACT Assessment Complete:  NO Objective: Blood pressure 128/60, pulse 61, temperature 97.3 F (36.3 C), temperature source Oral, resp. rate 16, height '5\' 11"'  (1.803 m), weight 68.947 kg (152 lb), SpO2 99.00%.Body mass index is 21.21 kg/(m^2). Results for orders placed during the hospital encounter of 04/28/14 (from the past 72 hour(s))  CBC     Status: Abnormal   Collection Time    05/05/14 12:09 AM      Result Value Ref Range   WBC 4.1  4.0 - 10.5 K/uL   RBC 3.45 (*) 4.22 - 5.81 MIL/uL   Hemoglobin 10.7 (*) 13.0 - 17.0 g/dL   HCT 33.5 (*) 39.0 - 52.0 %   MCV 97.1  78.0 - 100.0 fL   MCH 31.0  26.0 - 34.0 pg   MCHC 31.9  30.0 - 36.0 g/dL   RDW 14.1  11.5 - 15.5 %   Platelets 145 (*) 150 - 400 K/uL  BASIC METABOLIC PANEL     Status: Abnormal   Collection Time    05/05/14 12:09 AM      Result Value Ref Range   Sodium 137  137 - 147 mEq/L   Potassium 4.2  3.7 - 5.3 mEq/L   Chloride 104  96 - 112 mEq/L   CO2 20  19 - 32 mEq/L   Glucose, Bld 131 (*) 70 - 99 mg/dL   BUN 27 (*) 6 - 23 mg/dL   Creatinine, Ser 2.30 (*) 0.50 - 1.35 mg/dL   Calcium 9.8  8.4 - 10.5 mg/dL   GFR calc non Af Amer 28  (*) >90 mL/min   GFR calc Af Amer 33 (*) >90 mL/min   Comment: (NOTE)     The eGFR has been calculated using the CKD EPI equation.     This calculation has not been validated in all clinical situations.     eGFR's persistently <90 mL/min signify possible Chronic Kidney     Disease.   Anion gap 13  5 - 15  BASIC METABOLIC PANEL     Status: Abnormal   Collection Time    05/06/14  4:55 AM      Result  Value Ref Range   Sodium 140  137 - 147 mEq/L   Potassium 3.9  3.7 - 5.3 mEq/L   Chloride 105  96 - 112 mEq/L   CO2 20  19 - 32 mEq/L   Glucose, Bld 112 (*) 70 - 99 mg/dL   BUN 27 (*) 6 - 23 mg/dL   Creatinine, Ser 2.65 (*) 0.50 - 1.35 mg/dL   Calcium 9.9  8.4 - 10.5 mg/dL   GFR calc non Af Amer 24 (*) >90 mL/min   GFR calc Af Amer 28 (*) >90 mL/min   Comment: (NOTE)     The eGFR has been calculated using the CKD EPI equation.     This calculation has not been validated in all clinical situations.     eGFR's persistently <90 mL/min signify possible Chronic Kidney     Disease.   Anion gap 15  5 - 15  CBC     Status: Abnormal   Collection Time    05/06/14  4:55 AM      Result Value Ref Range   WBC 4.1  4.0 - 10.5 K/uL   RBC 3.54 (*) 4.22 - 5.81 MIL/uL   Hemoglobin 10.8 (*) 13.0 - 17.0 g/dL   HCT 34.8 (*) 39.0 - 52.0 %   MCV 98.3  78.0 - 100.0 fL   MCH 30.5  26.0 - 34.0 pg   MCHC 31.0  30.0 - 36.0 g/dL   RDW 13.9  11.5 - 15.5 %   Platelets 140 (*) 150 - 400 K/uL  CBC     Status: Abnormal   Collection Time    05/07/14  4:02 AM      Result Value Ref Range   WBC 3.9 (*) 4.0 - 10.5 K/uL   RBC 3.17 (*) 4.22 - 5.81 MIL/uL   Hemoglobin 9.8 (*) 13.0 - 17.0 g/dL   HCT 31.2 (*) 39.0 - 52.0 %   MCV 98.4  78.0 - 100.0 fL   MCH 30.9  26.0 - 34.0 pg   MCHC 31.4  30.0 - 36.0 g/dL   RDW 14.1  11.5 - 15.5 %   Platelets 126 (*) 150 - 400 K/uL  BASIC METABOLIC PANEL     Status: Abnormal   Collection Time    05/07/14  4:02 AM      Result Value Ref Range   Sodium 140  137 - 147 mEq/L    Potassium 4.2  3.7 - 5.3 mEq/L   Chloride 108  96 - 112 mEq/L   CO2 21  19 - 32 mEq/L   Glucose, Bld 90  70 - 99 mg/dL   BUN 24 (*) 6 - 23 mg/dL   Creatinine, Ser 2.37 (*) 0.50 - 1.35 mg/dL   Calcium 9.4  8.4 - 10.5 mg/dL   GFR calc non Af Amer 27 (*) >90 mL/min   GFR calc Af Amer 32 (*) >90 mL/min   Comment: (NOTE)     The eGFR has been calculated using the CKD EPI equation.     This calculation has not been validated in all clinical situations.     eGFR's persistently <90 mL/min signify possible Chronic Kidney     Disease.   Anion gap 11  5 - 15   Labs are reviewed and are pertinent for .  Current Facility-Administered Medications  Medication Dose Route Frequency Provider Last Rate Last Dose  . acetaminophen (TYLENOL) tablet 650 mg  650 mg Oral Q6H PRN Sid Falcon, MD  650 mg at 04/29/14 0051   Or  . acetaminophen (TYLENOL) suppository 650 mg  650 mg Rectal Q6H PRN Sid Falcon, MD      . ALPRAZolam [name]) tablet 0.5 mg  0.5 mg Oral TID Durward Parcel, MD   0.5 mg at 05/07/14 0924  . clopidogrel (PLAVIX) tablet 75 mg  75 mg Oral q morning - 10a Sid Falcon, MD   75 mg at 05/07/14 0924  . haloperidol (HALDOL) tablet 5 mg  5 mg Oral Q6H PRN Venetia Maxon Rama, MD   5 mg at 05/07/14 0203   Or  . haloperidol lactate (HALDOL) injection 5 mg  5 mg Intramuscular Q6H PRN Venetia Maxon Rama, MD   5 mg at 05/01/14 1158  . lamoTRIgine (LAMICTAL) tablet 75 mg  75 mg Oral BID Durward Parcel, MD   75 mg at 05/07/14 0924  . metoprolol succinate (TOPROL-XL) 24 hr tablet 25 mg  25 mg Oral q morning - 10a Sid Falcon, MD   25 mg at 05/07/14 0240  . OLANZapine zydis (ZYPREXA) disintegrating tablet 15 mg  15 mg Oral QHS Durward Parcel, MD   15 mg at 05/06/14 2039  . simvastatin (ZOCOR) tablet 10 mg  10 mg Oral QHS Sid Falcon, MD   10 mg at 05/06/14 2040  . tamsulosin (FLOMAX) capsule 0.4 mg  0.4 mg Oral q morning - 10a Sid Falcon, MD   0.4 mg at  05/07/14 9735  . traZODone (DESYREL) tablet 100 mg  100 mg Oral QHS Sid Falcon, MD   100 mg at 05/06/14 2039    Psychiatric Specialty Exam: Physical Exam  ROS  Blood pressure 128/60, pulse 61, temperature 97.3 F (36.3 C), temperature source Oral, resp. rate 16, height '5\' 11"'  (1.803 m), weight 68.947 kg (152 lb), SpO2 99.00%.Body mass index is 21.21 kg/(m^2).  General Appearance: Guarded  Eye Contact::  Fair  Speech:  Blocked and Clear and Coherent  Volume:  Normal  Mood: anxious  Affect:  Labile  Thought Process:  Disorganized, Irrelevant and Tangential  Orientation:  Full (Time, Place, and Person)  Thought Content:  WDL  Suicidal Thoughts:  No  Homicidal Thoughts:  No  Memory:  Immediate;   Fair  Judgement:  Impaired  Insight:  Lacking  Psychomotor Activity:  Restlessness  Concentration:  Fair  Recall:  [place]: Fair  Akathisia:  NA  Handed:  Right  AIMS (if indicated):     Assets:  Communication Skills Desire for Improvement Financial Resources/Insurance Housing Intimacy Leisure Time Physical Health Resilience Social Support  Sleep:      Musculoskeletal: Strength & Muscle Tone: within normal limits Gait & Station: unable to stand Patient leans: N/A  Treatment Plan Summary: Daily contact with patient to assess and evaluate symptoms and progress in treatment Medication management Continue Zyprexa Zydis 15 mg daily 8 PM to reduce the sedation and increase Lamictal 100 mg 2 times a day for controlling the mania and also decrease Xanax to 0.5 mg 3 times daily for anxiety.  [facility] Patient involuntary commitment documentation is up dated as per social service  Maritta Kief,JANARDHAHA R. 05/07/2014 11:14 AM

## 2014-05-07 NOTE — Progress Notes (Signed)
Patient ID: Jeffrey Herring, male   DOB: Aug 08, 1951, 63 y.o.   MRN: [id number] [facility] PROGRESS NOTE  Jeffrey Herring [id number] DOB: May 15, 1951 DOA: 04/28/2014 PCP: Ashok Norris, MD  Brief narrative: 63 y.o. male with PMH of Bipolar type 1, CAD, chronic systolic CHF (TTE 123XX123 with EF of 30-35%), complete HB with PM in place, CKD, DI, h/o DVT and PE (not on coumadin currently) who was admitted 04/28/14 with recent confusion and acute kidney injury. Of note, he was recently hospitalized from 04/09/14-04/23/14 for treatment of lithium toxicity. He was subsequently discharged to a SNF. He called the police from his SNF, stating he was being "held hostage". Psychiatry has recommended inpatient psychiatric treatment. Awaiting placement.   Assessment and Plan:   Principal Problem:  Psychosis / bipolar 1 disorder  TSH and B12 levels WNL.  Continue Xanax, Haldol PRN, Zyprexa, Lamictal and Trazodone.  Has involuntary commitment papers on the chart.  Evaluated by psychiatrist 04/29/14, medications adjusted with increased Zyprexa and initiation of lamictal.  Active Problems:  Anemia of chronic disease / anemia of renal disease  B12 level WNL.  Hemoglobin is stable. No current indications for transfusion.  Coronary artery disease  Continue Plavix, metoprolol, and Zocor. Cardiac pacemaker in situ  Chronic kidney disease, stage 4  Renal ultrasound done 7/15, echogenic parenchyma but no hydronephrosis or renal obstruction.  Baseline creatinine around 3 in 03/2014. Creatinine now 2.65 which is around baseline values Added IV fluids for 24 hours and Cr improved. Stop IV fluids today (after 24 hour duration) Chronic systolic CHF (congestive heart failure)  Status post 2-D echo 01/08/14: EF 30-35%.  Currently compensated.  DVT Prophylaxis  Use SCD's due to slight thrombocytopenia   Code Status: Full.  Family Communication: no family at bedside  Disposition Plan: Psychiatric facility when  bed available.    IV Access:   Peripheral IV   Consultants:  Psychiatry (Dr. Ardath Sax) Procedures:  04/28/14: Chest x-ray: No acute cardiopulmonary abnormality. Subsegmental atelectasis.  04/28/14: CT of the head: No acute intracranial process. Encephalomalacia in the right posterior parietal/occipital lobe.  04/29/14: Renal ultrasound: Slightly echogenic renal parenchyma. No hydronephrosis or renal obstruction. Antibiotics:  None    Leisa Lenz, MD  [facility] Pager [phone]  If 7PM-7AM, please contact night-coverage [email or web address] Password [email or web address] 05/07/2014, 7:06 AM   LOS: 9 days    HPI/Subjective: No acute overnight events.  Objective: Filed Vitals:   05/05/14 2101 05/06/14 0627 05/06/14 1504 05/06/14 2135  BP: 121/59 105/68 112/73 128/60  Pulse: 65 60 61 61  Temp:  97.4 F (36.3 C) 97.5 F (36.4 C) 97.3 F (36.3 C)  TempSrc: Oral Oral Oral Oral  Resp: 18 17 16    Height:      Weight:      SpO2: 99% 95% 100% 99%    Intake/Output Summary (Last 24 hours) at 05/07/14 [id number] Last data filed at 05/07/14 0600  Gross per 24 hour  Intake   2775 ml  Output   1675 ml  Net   1100 ml    Exam:   General:  Pt is alert, follows commands appropriately, not in acute distress  Cardiovascular: Regular rate and rhythm, S1/S2, no murmurs  Respiratory: Clear to auscultation bilaterally, no wheezing, no crackles, no rhonchi  Abdomen: Soft, non tender, non distended, bowel sounds present  Extremities: No edema, pulses DP and PT palpable bilaterally  Neuro: Grossly nonfocal  Data Reviewed: Basic Metabolic Panel:  Recent Labs Lab 05/04/14 0408 05/05/14 0009 05/06/14 0455  05/07/14 0402  NA 140 137 140 140  K 3.8 4.2 3.9 4.2  CL 106 104 105 108  CO2 23 20 20 21   GLUCOSE 100* 131* 112* 90  BUN 25* 27* 27* 24*  CREATININE 2.40* 2.30* 2.65* 2.37*  CALCIUM 9.5 9.8 9.9 9.4   Liver Function Tests: No results found for this basename: AST, ALT,  ALKPHOS, BILITOT, PROT, ALBUMIN,  in the last 168 hours No results found for this basename: LIPASE, AMYLASE,  in the last 168 hours No results found for this basename: AMMONIA,  in the last 168 hours CBC:  Recent Labs Lab 05/04/14 0408 05/05/14 0009 05/06/14 0455 05/07/14 0402  WBC 4.4 4.1 4.1 3.9*  HGB 10.1* 10.7* 10.8* 9.8*  HCT 32.2* 33.5* 34.8* 31.2*  MCV 99.4 97.1 98.3 98.4  PLT 151 145* 140* 126*   Cardiac Enzymes: No results found for this basename: CKTOTAL, CKMB, CKMBINDEX, TROPONINI,  in the last 168 hours BNP: No components found with this basename: POCBNP,  CBG:  Recent Labs Lab 04/30/14 2053 05/01/14 0007  GLUCAP 90 96    No results found for this or any previous visit (from the past 240 hour(s)).   Studies: No results found.  Scheduled Meds: . ALPRAZolam  0.5 mg Oral TID  . clopidogrel  75 mg Oral q morning - 10a  . lamoTRIgine  75 mg Oral BID  . metoprolol succinate  25 mg Oral q morning - 10a  . OLANZapine zydis  15 mg Oral QHS  . simvastatin  10 mg Oral QHS  . tamsulosin  0.4 mg Oral q morning - 10a  . traZODone  100 mg Oral QHS   Continuous Infusions: . sodium chloride 75 mL/hr at 05/07/14 0000

## 2014-05-08 DIAGNOSIS — F319 Bipolar disorder, unspecified: Secondary | ICD-10-CM | POA: Diagnosis not present

## 2014-05-08 DIAGNOSIS — N179 Acute kidney failure, unspecified: Secondary | ICD-10-CM | POA: Diagnosis not present

## 2014-05-08 DIAGNOSIS — R4182 Altered mental status, unspecified: Secondary | ICD-10-CM | POA: Diagnosis not present

## 2014-05-08 DIAGNOSIS — E860 Dehydration: Secondary | ICD-10-CM

## 2014-05-08 LAB — CBC
HCT: 31.1 % — ABNORMAL LOW (ref 39.0–52.0)
Hemoglobin: 9.8 g/dL — ABNORMAL LOW (ref 13.0–17.0)
MCH: 31 pg (ref 26.0–34.0)
MCHC: 31.5 g/dL (ref 30.0–36.0)
MCV: 98.4 fL (ref 78.0–100.0)
Platelets: 121 10*3/uL — ABNORMAL LOW (ref 150–400)
RBC: 3.16 MIL/uL — ABNORMAL LOW (ref 4.22–5.81)
RDW: 14 % (ref 11.5–15.5)
WBC: 3.7 10*3/uL — ABNORMAL LOW (ref 4.0–10.5)

## 2014-05-08 LAB — BASIC METABOLIC PANEL
Anion gap: 13 (ref 5–15)
BUN: 21 mg/dL (ref 6–23)
CO2: 22 mEq/L (ref 19–32)
Calcium: 9.6 mg/dL (ref 8.4–10.5)
Chloride: 105 mEq/L (ref 96–112)
Creatinine, Ser: 2.41 mg/dL — ABNORMAL HIGH (ref 0.50–1.35)
GFR calc Af Amer: 31 mL/min — ABNORMAL LOW (ref >90)
GFR calc non Af Amer: 27 mL/min — ABNORMAL LOW (ref >90)
Glucose, Bld: 86 mg/dL (ref 70–99)
Potassium: 3.9 mEq/L (ref 3.7–5.3)
Sodium: 140 mEq/L (ref 137–147)

## 2014-05-08 NOTE — Progress Notes (Signed)
Clinical Social Work Progress Note PSYCHIATRY SERVICE LINE 05/08/2014  Patient:  Jeffrey Herring [facility]  Account:  [id number]  [place] Date:  04/28/2014  Clinical Social Worker:  Sindy Messing, LCSW  Date/Time:  05/08/2014 11:00 AM  Review of Patient  Overall Medical Condition:   Patient is medically stable but waiting on inpatient psych placement.   Participation Level:  Active  Participation Quality  Appropriate   Other Participation Quality:   Affect  Appropriate   Cognitive  Confused   Reaction to Medications/Concerns:   None reported   Modes of Intervention  Support   Summary of Progress/Plan at Discharge   CSW met with patient at bedside. Patient laying in bed with blankets pulled to his neck. Patient reports he has been eating lots lately and always hungry and reports he is worried that RN are "slipping him mickeys at night" to make him sleep.    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 Patient moved to [place] about 10 years ago because his family wanted to move. Patient reports things have been going well and he is ready to DC home. Patient is unable to give any specifics on family or where he lives now.     Sitter reports that patient remains confused and has been hallucinating this morning. Sitter reports that patient has not been combative or aggressive today.    Psych MD continues to recommend inpatient placement. Patient is on [facility] waiting list. CSW spoke with [facility] who provided extended authorization # [id number] valid from 05/08/14 to 05/09/14. [facility] reported they would only provide a 1 day authorization at this time [place].     CSW will continue to follow.      [place], [place] [phone]

## 2014-05-08 NOTE — Progress Notes (Signed)
Patient ID: Jeffrey Herring, male   DOB: 1951-09-06, 63 y.o.   MRN: [id number] [facility] PROGRESS NOTE  Jeffrey Herring [id number] DOB: May 11, 1951 DOA: 04/28/2014 PCP: Ashok Norris, MD  Brief narrative: 63 y.o. male with PMH of Bipolar type 1, CAD, chronic systolic CHF (TTE 123XX123 with EF of 30-35%), complete HB with PM in place, CKD, DI, h/o DVT and PE (not on coumadin currently) who was admitted 04/28/14 with recent confusion and acute kidney injury. Of note, he was recently hospitalized from 04/09/14-04/23/14 for treatment of lithium toxicity. He was subsequently discharged to a SNF. He called the police from his SNF, stating he was being "held hostage". Psychiatry has recommended inpatient psychiatric treatment. Awaiting placement.   Assessment and Plan:   Principal Problem:  Psychosis / bipolar 1 disorder  TSH and B12 levels WNL. Mental status better, improving.  Continue Xanax 0.5 mg PO TID, Lamictal  100 mg PO BID, Zyprexa 15 mg PO Q HS, risperidone 0.5 mg PO Q HS, trazodone 150 mg PO Q HS; haldol PRN Has involuntary commitment papers on the chart.  Evaluated by psychiatrist 04/29/14, medications adjusted with increased Zyprexa and initiation of lamictal.  Active Problems:  Anemia of chronic disease / anemia of renal disease  B12 level WNL.  Hemoglobin is stable. No current indications for transfusion.  Thrombocytopenia, neutropenia  Possibly bone marrow suppression from antipsychotic mediations  Counts stable Coronary artery disease  Continue Plavix, metoprolol, and Zocor. Cardiac pacemaker in situ  Chronic kidney disease, stage 4  Renal ultrasound done 7/15, echogenic parenchyma but no hydronephrosis or renal obstruction.  Baseline creatinine around 3 in 03/2014. Creatinine now 2.41 which is around baseline values  Chronic systolic CHF (congestive heart failure)  Status post 2-D echo 01/08/14: EF 30-35%.  Currently compensated.  DVT Prophylaxis  Use SCD's due to  slight thrombocytopenia   Code Status: Full.  Family Communication: no family at bedside  Disposition Plan: Psychiatric facility when bed available.   IV access:  Peripheral IV Consultants:  Psychiatry (Dr. Ardath Sax) Procedures:  04/28/14: Chest x-ray: No acute cardiopulmonary abnormality. Subsegmental atelectasis.  04/28/14: CT of the head: No acute intracranial process. Encephalomalacia in the right posterior parietal/occipital lobe.  04/29/14: Renal ultrasound: Slightly echogenic renal parenchyma. No hydronephrosis or renal obstruction. Antibiotics:  None     Code Status: full code  Family Communication: plan of care discussed with the patient Disposition Plan: home when stable   Leisa Lenz, MD  [facility] Pager [phone]  If 7PM-7AM, please contact night-coverage [email or web address] Password [email or web address] 05/08/2014, 10:10 AM   LOS: 10 days    HPI/Subjective: No acute overnight events.  Objective: Filed Vitals:   05/06/14 2135 05/07/14 1446 05/07/14 2307 05/08/14 0517  BP: 128/60 102/57 106/60 121/62  Pulse: 61 63 64 60  Temp:  97.8 F (36.6 C) 97.5 F (36.4 C) 97.7 F (36.5 C)  TempSrc: Oral Oral Oral Oral  Resp:  18 18 16   Height:      Weight:      SpO2: 99% 98% 96% 96%    Intake/Output Summary (Last 24 hours) at 05/08/14 1010 Last data filed at 05/08/14 0900  Gross per 24 hour  Intake 1635.67 ml  Output   1400 ml  Net 235.67 ml    Exam:   General:  Pt is alert, follows commands appropriately, not in acute distress  Cardiovascular: Regular rate and rhythm, S1/S2, no murmurs  Respiratory: Clear to auscultation bilaterally, no wheezing, no crackles, no rhonchi  Abdomen:  Soft, non tender, non distended, bowel sounds present  Extremities: No edema, pulses DP and PT palpable bilaterally  Neuro: Grossly nonfocal  Data Reviewed: Basic Metabolic Panel:  Recent Labs Lab 05/04/14 0408 05/05/14 0009 05/06/14 0455 05/07/14 0402  05/08/14 0441  NA 140 137 140 140 140  K 3.8 4.2 3.9 4.2 3.9  CL 106 104 105 108 105  CO2 23 20 20 21 22   GLUCOSE 100* 131* 112* 90 86  BUN 25* 27* 27* 24* 21  CREATININE 2.40* 2.30* 2.65* 2.37* 2.41*  CALCIUM 9.5 9.8 9.9 9.4 9.6   Liver Function Tests: No results found for this basename: AST, ALT, ALKPHOS, BILITOT, PROT, ALBUMIN,  in the last 168 hours No results found for this basename: LIPASE, AMYLASE,  in the last 168 hours No results found for this basename: AMMONIA,  in the last 168 hours CBC:  Recent Labs Lab 05/04/14 0408 05/05/14 0009 05/06/14 0455 05/07/14 0402 05/08/14 0441  WBC 4.4 4.1 4.1 3.9* 3.7*  HGB 10.1* 10.7* 10.8* 9.8* 9.8*  HCT 32.2* 33.5* 34.8* 31.2* 31.1*  MCV 99.4 97.1 98.3 98.4 98.4  PLT 151 145* 140* 126* 121*   Cardiac Enzymes: No results found for this basename: CKTOTAL, CKMB, CKMBINDEX, TROPONINI,  in the last 168 hours BNP: No components found with this basename: POCBNP,  CBG: No results found for this basename: GLUCAP,  in the last 168 hours  No results found for this or any previous visit (from the past 240 hour(s)).   Studies: No results found.  Scheduled Meds: . ALPRAZolam  0.5 mg Oral TID  . clopidogrel  75 mg Oral q morning - 10a  . lamoTRIgine  100 mg Oral BID  . metoprolol succinate  25 mg Oral q morning - 10a  . OLANZapine zydis  15 mg Oral QHS  . risperiDONE  0.5 mg Oral QHS  . simvastatin  10 mg Oral QHS  . tamsulosin  0.4 mg Oral q morning - 10a  . traZODone  150 mg Oral QHS

## 2014-05-08 NOTE — Progress Notes (Signed)
Pt was sitting up in bed when I arrived. Our visit was brief as he said he was tired. Pt talked about family and thought he heard his daughter's voice in hallway. He talked about his home [place]. Pt was appreciative and pleasant during our visit. He asked for card. I told him to let nurse know whenever he wants a visit.  pls call [place]  05/08/14 1500  Clinical Encounter Type  Visited With Patient

## 2014-05-09 DIAGNOSIS — F319 Bipolar disorder, unspecified: Secondary | ICD-10-CM | POA: Diagnosis not present

## 2014-05-09 DIAGNOSIS — E860 Dehydration: Secondary | ICD-10-CM | POA: Diagnosis not present

## 2014-05-09 DIAGNOSIS — N179 Acute kidney failure, unspecified: Secondary | ICD-10-CM | POA: Diagnosis not present

## 2014-05-09 DIAGNOSIS — R4182 Altered mental status, unspecified: Secondary | ICD-10-CM | POA: Diagnosis not present

## 2014-05-09 LAB — CBC
HCT: 34.9 % — ABNORMAL LOW (ref 39.0–52.0)
Hemoglobin: 10.9 g/dL — ABNORMAL LOW (ref 13.0–17.0)
MCH: 30.9 pg (ref 26.0–34.0)
MCHC: 31.2 g/dL (ref 30.0–36.0)
MCV: 98.9 fL (ref 78.0–100.0)
Platelets: 121 10*3/uL — ABNORMAL LOW (ref 150–400)
RBC: 3.53 MIL/uL — ABNORMAL LOW (ref 4.22–5.81)
RDW: 14.1 % (ref 11.5–15.5)
WBC: 4.4 10*3/uL (ref 4.0–10.5)

## 2014-05-09 LAB — BASIC METABOLIC PANEL
Anion gap: 13 (ref 5–15)
BUN: 24 mg/dL — ABNORMAL HIGH (ref 6–23)
CO2: 23 mEq/L (ref 19–32)
Calcium: 10 mg/dL (ref 8.4–10.5)
Chloride: 104 mEq/L (ref 96–112)
Creatinine, Ser: 2.55 mg/dL — ABNORMAL HIGH (ref 0.50–1.35)
GFR calc Af Amer: 29 mL/min — ABNORMAL LOW (ref >90)
GFR calc non Af Amer: 25 mL/min — ABNORMAL LOW (ref >90)
Glucose, Bld: 96 mg/dL (ref 70–99)
Potassium: 4.5 mEq/L (ref 3.7–5.3)
Sodium: 140 mEq/L (ref 137–147)

## 2014-05-09 NOTE — Progress Notes (Signed)
Patient ID: Jeffrey Herring, male   DOB: 20-Jul-1951, 63 y.o.   MRN: [id number] [facility] PROGRESS NOTE  [name] [id number] DOB: January 18, 1951 DOA: 04/28/2014 PCP: Ashok Norris, MD  Brief narrative: 63 y.o. male with PMH of Bipolar type 1, CAD, chronic systolic CHF (TTE 123XX123 with EF of 30-35%), complete HB with PM in place, CKD, DI, h/o DVT and PE (not on coumadin currently) who was admitted 04/28/14 with recent confusion and acute kidney injury. Of note, he was recently hospitalized from 04/09/14-04/23/14 for treatment of lithium toxicity. He was subsequently discharged to a SNF. He called the police from his SNF, stating he was being "held hostage". Psychiatry has recommended inpatient psychiatric treatment. Awaiting placement.   Assessment and Plan:   Principal Problem:  Psychosis / bipolar 1 disorder   TSH and B12 levels WNL. Mental status stable; has intermittent episodes of confusion, agitation.   Continue Xanax 0.5 mg PO TID, Lamictal 100 mg PO BID, Zyprexa 15 mg PO Q HS, risperidone 0.5 mg PO Q HS, trazodone 150 mg PO Q HS; haldol PRN   Involuntary commitment papers on the chart.   Evaluated by psychiatrist 04/29/14, medications adjusted with increased Zyprexa and initiation of lamictal.  Active Problems:  Anemia of chronic disease / anemia of renal disease   B12 level WNL.   Hemoglobin is stable. No current indications for transfusion.  Thrombocytopenia, neutropenia   Possibly bone marrow suppression from antipsychotic mediations   WBC count normalized but platelets remain around 120 range Coronary artery disease / Cardiac pacemaker in situ  Continue Plavix, metoprolol, and Zocor.  Chronic kidney disease, stage 4   Renal ultrasound done 7/15, echogenic parenchyma but no hydronephrosis or renal obstruction.   Baseline creatinine around 3 in 03/2014. Creatinine now 2.41 which is around baseline values  Chronic systolic CHF (congestive heart failure)    Status post 2-D echo 01/08/14: EF 30-35%.   Currently compensated.  DVT Prophylaxis   Use SCD's due to slight thrombocytopenia   Code Status: Full.  Family Communication: no family at bedside  Disposition Plan: Psychiatric facility when bed available.    IV access:   Peripheral IV Consultants:  Psychiatry (Dr. Ardath Sax) Procedures:  04/28/14: Chest x-ray: No acute cardiopulmonary abnormality. Subsegmental atelectasis.  04/28/14: CT of the head: No acute intracranial process. Encephalomalacia in the right posterior parietal/occipital lobe.  04/29/14: Renal ultrasound: Slightly echogenic renal parenchyma. No hydronephrosis or renal obstruction. Antibiotics:  None    Code Status: full code  Family Communication: plan of care discussed with the patient  Disposition Plan: home when stable   Leisa Lenz, MD  [facility] Pager [phone]  If 7PM-7AM, please contact night-coverage [email or web address] Password [email or web address] 05/09/2014, 9:21 AM   LOS: 11 days    HPI/Subjective: No acute overnight events.  Objective: Filed Vitals:   05/08/14 0517 05/08/14 1358 05/08/14 2101 05/09/14 0701  BP: 121/62 105/43 128/70 154/73  Pulse: 60 60 68 70  Temp: 97.7 F (36.5 C) 98.3 F (36.8 C) 98.8 F (37.1 C) 97.5 F (36.4 C)  TempSrc: Oral Oral Oral Oral  Resp: 16 18 18 18   Height:      Weight:      SpO2: 96% 95% 96% 99%    Intake/Output Summary (Last 24 hours) at 05/09/14 0921 Last data filed at 05/09/14 0826  Gross per 24 hour  Intake   2049 ml  Output      0 ml  Net   2049 ml    Exam:  General:  Pt is sleeping this am, no distress  Cardiovascular: Regular rate and rhythm, S1/S2, no murmurs  Respiratory: bilateral air entry, no wheezing   Abdomen: Soft, non tender, non distended, bowel sounds present  Extremities: No edema, pulses DP and PT palpable bilaterally  Neuro: Grossly nonfocal  Data Reviewed: Basic Metabolic Panel:  Recent Labs Lab  05/05/14 0009 05/06/14 0455 05/07/14 0402 05/08/14 0441 05/09/14 0420  NA 137 140 140 140 140  K 4.2 3.9 4.2 3.9 4.5  CL 104 105 108 105 104  CO2 20 20 21 22 23   GLUCOSE 131* 112* 90 86 96  BUN 27* 27* 24* 21 24*  CREATININE 2.30* 2.65* 2.37* 2.41* 2.55*  CALCIUM 9.8 9.9 9.4 9.6 10.0   Liver Function Tests: No results found for this basename: AST, ALT, ALKPHOS, BILITOT, PROT, ALBUMIN,  in the last 168 hours No results found for this basename: LIPASE, AMYLASE,  in the last 168 hours No results found for this basename: AMMONIA,  in the last 168 hours CBC:  Recent Labs Lab 05/05/14 0009 05/06/14 0455 05/07/14 0402 05/08/14 0441 05/09/14 0420  WBC 4.1 4.1 3.9* 3.7* 4.4  HGB 10.7* 10.8* 9.8* 9.8* 10.9*  HCT 33.5* 34.8* 31.2* 31.1* 34.9*  MCV 97.1 98.3 98.4 98.4 98.9  PLT 145* 140* 126* 121* 121*   Cardiac Enzymes: No results found for this basename: CKTOTAL, CKMB, CKMBINDEX, TROPONINI,  in the last 168 hours BNP: No components found with this basename: POCBNP,  CBG: No results found for this basename: GLUCAP,  in the last 168 hours  No results found for this or any previous visit (from the past 240 hour(s)).   Studies: No results found.  Scheduled Meds: . ALPRAZolam  0.5 mg Oral TID  . clopidogrel  75 mg Oral q morning - 10a  . lamoTRIgine  100 mg Oral BID  . metoprolol succinate  25 mg Oral q morning - 10a  . OLANZapine zydis  15 mg Oral QHS  . risperiDONE  0.5 mg Oral QHS  . simvastatin  10 mg Oral QHS  . tamsulosin  0.4 mg Oral q morning - 10a  . traZODone  150 mg Oral QHS

## 2014-05-10 DIAGNOSIS — F319 Bipolar disorder, unspecified: Secondary | ICD-10-CM | POA: Diagnosis not present

## 2014-05-10 DIAGNOSIS — R4182 Altered mental status, unspecified: Secondary | ICD-10-CM | POA: Diagnosis not present

## 2014-05-10 DIAGNOSIS — N179 Acute kidney failure, unspecified: Secondary | ICD-10-CM | POA: Diagnosis not present

## 2014-05-10 DIAGNOSIS — Z950 Presence of cardiac pacemaker: Secondary | ICD-10-CM | POA: Diagnosis not present

## 2014-05-10 DIAGNOSIS — N289 Disorder of kidney and ureter, unspecified: Secondary | ICD-10-CM | POA: Diagnosis not present

## 2014-05-10 DIAGNOSIS — E860 Dehydration: Secondary | ICD-10-CM | POA: Diagnosis not present

## 2014-05-10 DIAGNOSIS — E785 Hyperlipidemia, unspecified: Secondary | ICD-10-CM | POA: Diagnosis not present

## 2014-05-10 NOTE — Progress Notes (Signed)
Pt's appetite good. Ambulating in room. Sitter present throughout shift. Wife in to visit.Pt sleeps at long intervals

## 2014-05-10 NOTE — Progress Notes (Signed)
Patient ID: Jeffrey Herring, male   DOB: [birth year], 63 y.o.   MRN: [id number] [facility] PROGRESS NOTE  Jeffrey Herring [id number] DOB: 06/01/1951 DOA: 04/28/2014 PCP: Ashok Norris, MD  Brief narrative: 63 y.o. male with PMH of Bipolar type 1, CAD, chronic systolic CHF (TTE 123XX123 with EF of 30-35%), complete HB with PM in place, CKD, DI, h/o DVT and PE (not on coumadin currently) who was admitted 04/28/14 with recent confusion and acute kidney injury. Of note, he was recently hospitalized from 04/09/14-04/23/14 for treatment of lithium toxicity. He was subsequently discharged to a SNF. He called the police from his SNF, stating he was being "held hostage". Psychiatry has recommended inpatient psychiatric treatment. Awaiting placement.   Assessment and Plan:   Principal Problem:  Psychosis / bipolar 1 disorder  TSH and B12 levels WNL. Mental status stable; has intermittent episodes of confusion, agitation.  Continue Xanax 0.5 mg PO TID, Lamictal 100 mg PO BID, Zyprexa 15 mg PO Q HS, risperidone 0.5 mg PO Q HS, trazodone 150 mg PO Q HS; haldol PRN  Involuntary commitment papers on the chart.  Evaluated by psychiatrist 04/29/14, medications adjusted with increased Zyprexa and initiation of lamictal.  Active Problems:  Anemia of chronic disease / anemia of renal disease  B12 level WNL.  Hemoglobin is stable. No current indications for transfusion.  Thrombocytopenia, neutropenia  Possibly bone marrow suppression from antipsychotic mediations  WBC count normalized but platelets remain around 120 range Coronary artery disease / Cardiac pacemaker in situ Continue Plavix, metoprolol, and Zocor.  Chronic kidney disease, stage 4  Renal ultrasound done 7/15, echogenic parenchyma but no hydronephrosis or renal obstruction.  Baseline creatinine around 3 in 03/2014. Creatinine ranges 2.41 - 2.55 which is around baseline values  Chronic systolic CHF (congestive heart failure)  Status post 2-D  echo 01/08/14: EF 30-35%.  Currently compensated.  DVT Prophylaxis  Use SCD's due to slight thrombocytopenia   Code Status: Full.  Family Communication: no family at bedside  Disposition Plan: Psychiatric facility when bed available.    IV access:  Peripheral IV Consultants:  Psychiatry (Dr. Ardath Sax) Procedures:  04/28/14: Chest x-ray: No acute cardiopulmonary abnormality. Subsegmental atelectasis.  04/28/14: CT of the head: No acute intracranial process. Encephalomalacia in the right posterior parietal/occipital lobe.  04/29/14: Renal ultrasound: Slightly echogenic renal parenchyma. No hydronephrosis or renal obstruction. Antibiotics:  None   Leisa Lenz, MD  [facility] Pager [phone]  If 7PM-7AM, please contact night-coverage [email or web address] Password [email or web address] 05/10/2014, 12:30 PM   LOS: 12 days    HPI/Subjective: No acute overnight events.  Objective: Filed Vitals:   05/09/14 1407 05/09/14 2117 05/10/14 0543 05/10/14 1057  BP: 134/62 115/61 128/60 116/59  Pulse: 61 59 60 60  Temp: 98.5 F (36.9 C) 97.3 F (36.3 C) 98.1 F (36.7 C)   TempSrc: Oral Oral Oral   Resp: 18 18 18    Height:      Weight:      SpO2: 100% 96% 97%     Intake/Output Summary (Last 24 hours) at 05/10/14 1230 Last data filed at 05/10/14 0745  Gross per 24 hour  Intake   1080 ml  Output      0 ml  Net   1080 ml    Exam:   General:  Pt is alert, follows commands appropriately, not in acute distress  Cardiovascular: Regular rate and rhythm, S1/S2 appreciated   Respiratory: Clear to auscultation bilaterally, no wheezing, no crackles, no rhonchi  Abdomen: Soft, non  tender, non distended, bowel sounds present  Extremities: No edema, pulses DP and PT palpable bilaterally  Neuro: Grossly nonfocal  Data Reviewed: Basic Metabolic Panel:  Recent Labs Lab 05/05/14 0009 05/06/14 0455 05/07/14 0402 05/08/14 0441 05/09/14 0420  NA 137 140 140 140 140  K 4.2  3.9 4.2 3.9 4.5  CL 104 105 108 105 104  CO2 20 20 21 22 23   GLUCOSE 131* 112* 90 86 96  BUN 27* 27* 24* 21 24*  CREATININE 2.30* 2.65* 2.37* 2.41* 2.55*  CALCIUM 9.8 9.9 9.4 9.6 10.0   Liver Function Tests: No results found for this basename: AST, ALT, ALKPHOS, BILITOT, PROT, ALBUMIN,  in the last 168 hours No results found for this basename: LIPASE, AMYLASE,  in the last 168 hours No results found for this basename: AMMONIA,  in the last 168 hours CBC:  Recent Labs Lab 05/05/14 0009 05/06/14 0455 05/07/14 0402 05/08/14 0441 05/09/14 0420  WBC 4.1 4.1 3.9* 3.7* 4.4  HGB 10.7* 10.8* 9.8* 9.8* 10.9*  HCT 33.5* 34.8* 31.2* 31.1* 34.9*  MCV 97.1 98.3 98.4 98.4 98.9  PLT 145* 140* 126* 121* 121*   Cardiac Enzymes: No results found for this basename: CKTOTAL, CKMB, CKMBINDEX, TROPONINI,  in the last 168 hours BNP: No components found with this basename: POCBNP,  CBG: No results found for this basename: GLUCAP,  in the last 168 hours  No results found for this or any previous visit (from the past 240 hour(s)).   Studies: No results found.  Scheduled Meds: . ALPRAZolam  0.5 mg Oral TID  . clopidogrel  75 mg Oral q morning - 10a  . lamoTRIgine  100 mg Oral BID  . metoprolol succinate  25 mg Oral q morning - 10a  . OLANZapine zydis  15 mg Oral QHS  . risperiDONE  0.5 mg Oral QHS  . simvastatin  10 mg Oral QHS  . tamsulosin  0.4 mg Oral q morning - 10a  . traZODone  150 mg Oral QHS   Continuous Infusions:

## 2014-05-11 ENCOUNTER — Telehealth (HOSPITAL_COMMUNITY): Payer: Self-pay

## 2014-05-11 DIAGNOSIS — R4182 Altered mental status, unspecified: Secondary | ICD-10-CM | POA: Diagnosis not present

## 2014-05-11 DIAGNOSIS — N183 Chronic kidney disease, stage 3 unspecified: Secondary | ICD-10-CM | POA: Diagnosis not present

## 2014-05-11 DIAGNOSIS — F319 Bipolar disorder, unspecified: Secondary | ICD-10-CM | POA: Diagnosis not present

## 2014-05-11 DIAGNOSIS — F3160 Bipolar disorder, current episode mixed, unspecified: Secondary | ICD-10-CM | POA: Diagnosis not present

## 2014-05-11 DIAGNOSIS — F29 Unspecified psychosis not due to a substance or known physiological condition: Secondary | ICD-10-CM | POA: Diagnosis not present

## 2014-05-11 MED ORDER — RISPERIDONE 0.5 MG PO TBDP: 0.5000 mg | ORAL_TABLET | Freq: Every day | ORAL | Status: DC

## 2014-05-11 MED ORDER — LAMOTRIGINE 100 MG PO TABS: 100.0000 mg | ORAL_TABLET | Freq: Two times a day (BID) | ORAL | Status: DC

## 2014-05-11 MED ORDER — OLANZAPINE 15 MG PO TBDP: 15.0000 mg | ORAL_TABLET | Freq: Every day | ORAL | Status: DC

## 2014-05-11 NOTE — Progress Notes (Signed)
Clinical Social Work  CSW spoke with bedside RN re: patient's behavior throughout the day. RN reports patient appears depressed but has been compliant with medications and has not been aggressive.  RN reports periods of confusion as well.  CSW met with patient and wife at bedside. Patient laying in bed and reports he is ready to DC home. Wife reports that patient is almost back to baseline and wants him to DC home with her. Patient and wife feel that SNF is no longer needed and wife can manage patient's needs at home. Patient reports he is taking medications and starting to feel better. Patient spoke with CSW about his outpatient psychiatrist (Dr. Adele Schilder) and how he plans to continue treatment on outpatient basis. Patient reports that he has seen the same psychiatrist for the past 6 years and knows that his illness can be treated on outpatient basis. CSW spoke with rounding psychiatrist (Dr. Adele Schilder) about patient who reports that patient will be evaluated tomorrow by rounding psychiatrist (Dr. Earlie Lou).  CSW will continue to follow.  [place], [place] [phone]

## 2014-05-11 NOTE — Progress Notes (Signed)
Utilization review completed.  

## 2014-05-11 NOTE — Progress Notes (Signed)
Clinical Social Work  CSW received a call from [employer] [facility]) re: patient's disposition plans. Director requested that [place] page Dr. Adele Schilder to determine if patient could be evaluated today. CSW paged psychiatrist and will await a return call.  [place], [place] [phone]

## 2014-05-11 NOTE — Consult Note (Signed)
[facility] Psychiatry Follow up Consult   Assessment: AXIS I:  Bipolar, mixed AXIS II:  Deferred AXIS III:   Past Medical History  Diagnosis Date  . Hyperlipemia   . Pacemaker   . Arrhythmia   . Diabetes insipidus   . History of kidney stones   . History of DVT (deep vein thrombosis)   . History of pulmonary embolism   . Bipolar 1 disorder   . Lithium toxicity   . Stage III chronic kidney disease    AXIS IV:  other psychosocial or environmental problems, problems related to social environment and problems with primary support group AXIS V:  51-60 moderate symptoms  Plan:  No evidence of imminent risk to self or others at present.   Supportive therapy provided about ongoing stressors. Discussed crisis plan, support from social network, calling 911, coming to the Emergency Department, and calling Suicide Hotline. Subjective:   NAYTHAN DOUTHIT is a 63 y.o. male patient admitted with Bipolar disorder.  HPI:  Mr. Overbeck is a 63 yo male  known to this provider from outpatient services.  Patient has long history of bipolar disorder.  He was admitted because of lithium toxicity.  His lithium was stopped.  He is taking Zyprexa and tolerating his medication.  Patient is close to his baseline.  He still had some hallucination and paranoia but he denies any suicidal thoughts or homicidal thoughts.  He recognized me.  He is calm and cooperative.  There has been no agitation anger or any aggressive behavior.  He does not have any side effects of medication.  I spoke to his wife who agreed that he is doing much better and responded well on Zyprexa.  He has lost weight but gradually his appetite is coming back.  She denies any suicidal parts of homicidal thought.  He is sleeping good.  He is pleasant and cooperative.  He wants to be discharged and like to followup with me in the office in 2 weeks.  His creatinine is still high.   Past Psychiatric History: Past Medical History  Diagnosis Date  .  Hyperlipemia   . Pacemaker   . Arrhythmia   . Diabetes insipidus   . History of kidney stones   . History of DVT (deep vein thrombosis)   . History of pulmonary embolism   . Bipolar 1 disorder   . Lithium toxicity   . Stage III chronic kidney disease     reports that he has been smoking Cigars.  He has never used smokeless tobacco. He reports that he does not drink alcohol or use illicit drugs. Family History  Problem Relation Age of Onset  . Depression Mother          Abuse/Neglect [facility]) Physical Abuse: Denies Verbal Abuse: Denies Sexual Abuse: Denies Allergies:   Allergies  Allergen Reactions  . Aspirin Hives  . Codeine Nausea And Vomiting  . Demerol [Meperidine] Nausea And Vomiting    ACT Assessment Complete:  NO Objective: Blood pressure 135/61, pulse 62, temperature 97.8 F (36.6 C), temperature source Oral, resp. rate 15, height '5\' 11"'  (1.803 m), weight 152 lb (68.947 kg), SpO2 98.00%.Body mass index is 21.21 kg/(m^2). Results for orders placed during the hospital encounter of 04/28/14 (from the past 72 hour(s))  CBC     Status: Abnormal   Collection Time    05/09/14  4:20 AM      Result Value Ref Range   WBC 4.4  4.0 - 10.5 K/uL  RBC 3.53 (*) 4.22 - 5.81 MIL/uL   Hemoglobin 10.9 (*) 13.0 - 17.0 g/dL   HCT 34.9 (*) 39.0 - 52.0 %   MCV 98.9  78.0 - 100.0 fL   MCH 30.9  26.0 - 34.0 pg   MCHC 31.2  30.0 - 36.0 g/dL   RDW 14.1  11.5 - 15.5 %   Platelets 121 (*) 150 - 400 K/uL  BASIC METABOLIC PANEL     Status: Abnormal   Collection Time    05/09/14  4:20 AM      Result Value Ref Range   Sodium 140  137 - 147 mEq/L   Potassium 4.5  3.7 - 5.3 mEq/L   Chloride 104  96 - 112 mEq/L   CO2 23  19 - 32 mEq/L   Glucose, Bld 96  70 - 99 mg/dL   BUN 24 (*) 6 - 23 mg/dL   Creatinine, Ser 2.55 (*) 0.50 - 1.35 mg/dL   Calcium 10.0  8.4 - 10.5 mg/dL   GFR calc non Af Amer 25 (*) >90 mL/min   GFR calc Af Amer 29 (*) >90 mL/min   Comment: (NOTE)     The eGFR has been  calculated using the CKD EPI equation.     This calculation has not been validated in all clinical situations.     eGFR's persistently <90 mL/min signify possible Chronic Kidney     Disease.   Anion gap 13  5 - 15   Labs are reviewed and are pertinent for .  Current Facility-Administered Medications  Medication Dose Route Frequency Provider Last Rate Last Dose  . acetaminophen (TYLENOL) tablet 650 mg  650 mg Oral Q6H PRN Sid Falcon, MD   650 mg at 04/29/14 0051   Or  . acetaminophen (TYLENOL) suppository 650 mg  650 mg Rectal Q6H PRN Sid Falcon, MD      . ALPRAZolam [name]) tablet 0.5 mg  0.5 mg Oral TID Durward Parcel, MD   0.5 mg at 05/11/14 1007  . clopidogrel (PLAVIX) tablet 75 mg  75 mg Oral q morning - 10a Sid Falcon, MD   75 mg at 05/11/14 1006  . haloperidol (HALDOL) tablet 5 mg  5 mg Oral Q6H PRN Venetia Maxon Rama, MD   5 mg at 05/09/14 0347   Or  . haloperidol lactate (HALDOL) injection 5 mg  5 mg Intramuscular Q6H PRN Venetia Maxon Rama, MD   5 mg at 05/01/14 1158  . lamoTRIgine (LAMICTAL) tablet 100 mg  100 mg Oral BID Durward Parcel, MD   100 mg at 05/11/14 1006  . metoprolol succinate (TOPROL-XL) 24 hr tablet 25 mg  25 mg Oral q morning - 10a Sid Falcon, MD   25 mg at 05/11/14 1007  . OLANZapine zydis (ZYPREXA) disintegrating tablet 15 mg  15 mg Oral QHS Durward Parcel, MD   15 mg at 05/10/14 2000  . risperiDONE (RISPERDAL M-TABS) disintegrating tablet 0.5 mg  0.5 mg Oral QHS Durward Parcel, MD   0.5 mg at 05/10/14 2159  . simvastatin (ZOCOR) tablet 10 mg  10 mg Oral QHS Sid Falcon, MD   10 mg at 05/10/14 2159  . tamsulosin (FLOMAX) capsule 0.4 mg  0.4 mg Oral q morning - 10a Sid Falcon, MD   0.4 mg at 05/11/14 1007  . traZODone (DESYREL) tablet 150 mg  150 mg Oral QHS Durward Parcel, MD   150 mg at 05/10/14  2200    Psychiatric Specialty Exam: Physical Exam  ROS  Blood pressure 135/61, pulse 62,  temperature 97.8 F (36.6 C), temperature source Oral, resp. rate 15, height '5\' 11"'  (1.803 m), weight 152 lb (68.947 kg), SpO2 98.00%.Body mass index is 21.21 kg/(m^2).  General Appearance: Casual  Eye Contact::  Fair  Speech:  Clear and Coherent and Normal Rate  Volume:  Normal  Mood: anxious  Affect:  Labile  Thought Process:  Coherent and Intact  Orientation:  Full (Time, Place, and Person)  Thought Content:  WDL  Suicidal Thoughts:  No  Homicidal Thoughts:  No  Memory:  Immediate;   Fair  Judgement:  Fair  Insight:  Fair  Psychomotor Activity:  Restlessness  Concentration:  Fair  Recall:  [employer] of Knowledge:Fair  Language: Fair  Akathisia:  NA  Handed:  Right  AIMS (if indicated):     Assets:  Communication Skills Desire for Improvement Financial Resources/Insurance Housing Intimacy Leisure Time Physical Health Resilience Social Support  Sleep:      Musculoskeletal: Strength & Muscle Tone: within normal limits Gait & Station: normal Patient leans: N/A  Treatment Plan Summary: Patient is showing much improvement on Zyprexa and Lamictal.  He does not have any rash or itching.  He is taking Xanax 0.5 mg 3 times a day.  He does not endorse any suicidal thoughts or homicidal thoughts.  His thinking is much clearer and coherent.  I spoke to his wife who also agreed that patient has improved from the past.  If patient is medically stable and cleared than he can be discharged from Consultation liaison psychiatry and seen as a outpatient in the office in 2-3 weeks.  Patient is known to this Probation officer from outpatient services.  Continue current medication.  Please call [phone] if you have any further questions, discontinue sitter   Crystalyn Delia T. 05/11/2014 6:01 PM
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
## 2014-05-11 NOTE — Progress Notes (Signed)
Clinical Social Work  CSW received a call from [facility] who confirmed patient remains on waiting list.   Sindy Messing, LCSW [phone]

## 2014-05-12 ENCOUNTER — Telehealth (HOSPITAL_COMMUNITY): Payer: Self-pay | Admitting: *Deleted

## 2014-05-12 MED ORDER — OLANZAPINE 15 MG PO TBDP: 15.0000 mg | ORAL_TABLET | Freq: Every day | ORAL | Status: DC

## 2014-05-12 NOTE — Telephone Encounter (Signed)
Wife left [id number] was discharged last night from hospital.Dr. Adele Schilder ordered Zyprexa zydis 15 mg.Was not ordered.Needs it called to [facility] pharmacy-they will order it. Per Dr. Adele Schilder, may order Zyprexa zydis 15 mg at hs, #30. Will review for refills at next appt. Called to [facility] as requested by wife.They do not have in stock-will order it.Should have after 4pm tomorrow. Contacted wife advised of conversation with [facility].

## 2014-05-15 ENCOUNTER — Other Ambulatory Visit: Payer: Self-pay

## 2014-05-15 ENCOUNTER — Emergency Department (HOSPITAL_COMMUNITY)
Admission: EM | Admit: 2014-05-15 | Discharge: 2014-05-16 | Disposition: A | Discharge status: Other Acute Inpatient Hospital | Payer: Medicare Other | Attending: Emergency Medicine | Admitting: Emergency Medicine

## 2014-05-15 ENCOUNTER — Emergency Department (HOSPITAL_COMMUNITY): Payer: Medicare Other

## 2014-05-15 ENCOUNTER — Encounter (HOSPITAL_COMMUNITY): Payer: Self-pay | Admitting: Emergency Medicine

## 2014-05-15 DIAGNOSIS — F1320 Sedative, hypnotic or anxiolytic dependence, uncomplicated: Secondary | ICD-10-CM | POA: Insufficient documentation

## 2014-05-15 DIAGNOSIS — F28 Other psychotic disorder not due to a substance or known physiological condition: Secondary | ICD-10-CM

## 2014-05-15 DIAGNOSIS — F23 Brief psychotic disorder: Secondary | ICD-10-CM | POA: Diagnosis not present

## 2014-05-15 DIAGNOSIS — Z87828 Personal history of other (healed) physical injury and trauma: Secondary | ICD-10-CM | POA: Diagnosis not present

## 2014-05-15 DIAGNOSIS — F1210 Cannabis abuse, uncomplicated: Secondary | ICD-10-CM | POA: Diagnosis not present

## 2014-05-15 DIAGNOSIS — F411 Generalized anxiety disorder: Secondary | ICD-10-CM | POA: Diagnosis not present

## 2014-05-15 DIAGNOSIS — Z008 Encounter for other general examination: Secondary | ICD-10-CM | POA: Diagnosis present

## 2014-05-15 DIAGNOSIS — F29 Unspecified psychosis not due to a substance or known physiological condition: Secondary | ICD-10-CM | POA: Diagnosis not present

## 2014-05-15 DIAGNOSIS — IMO0002 Reserved for concepts with insufficient information to code with codable children: Secondary | ICD-10-CM | POA: Diagnosis not present

## 2014-05-15 DIAGNOSIS — Z87891 Personal history of nicotine dependence: Secondary | ICD-10-CM | POA: Insufficient documentation

## 2014-05-15 DIAGNOSIS — S0990XA Unspecified injury of head, initial encounter: Secondary | ICD-10-CM | POA: Diagnosis not present

## 2014-05-15 DIAGNOSIS — S21109A Unspecified open wound of unspecified front wall of thorax without penetration into thoracic cavity, initial encounter: Secondary | ICD-10-CM | POA: Diagnosis not present

## 2014-05-15 HISTORY — DX: Depression, unspecified: F32.A

## 2014-05-15 HISTORY — DX: Major depressive disorder, single episode, unspecified: F32.9

## 2014-05-15 LAB — COMPREHENSIVE METABOLIC PANEL
ALT: 31 U/L (ref 0–53)
AST: 34 U/L (ref 0–37)
Albumin: 3.7 g/dL (ref 3.5–5.2)
Alkaline Phosphatase: 95 U/L (ref 39–117)
Anion gap: 16 — ABNORMAL HIGH (ref 5–15)
BUN: 26 mg/dL — ABNORMAL HIGH (ref 6–23)
CO2: 22 mEq/L (ref 19–32)
Calcium: 10.2 mg/dL (ref 8.4–10.5)
Chloride: 106 mEq/L (ref 96–112)
Creatinine, Ser: 2.33 mg/dL — ABNORMAL HIGH (ref 0.50–1.35)
GFR calc Af Amer: 32 mL/min — ABNORMAL LOW (ref >90)
GFR calc non Af Amer: 28 mL/min — ABNORMAL LOW (ref >90)
Glucose, Bld: 114 mg/dL — ABNORMAL HIGH (ref 70–99)
Potassium: 4.1 mEq/L (ref 3.7–5.3)
Sodium: 144 mEq/L (ref 137–147)
Total Bilirubin: <0.2 mg/dL — ABNORMAL LOW (ref 0.3–1.2)
Total Protein: 7.1 g/dL (ref 6.0–8.3)

## 2014-05-15 LAB — PREPARE FRESH FROZEN PLASMA
Unit division: 0
Unit division: 0

## 2014-05-15 LAB — URINALYSIS, ROUTINE W REFLEX MICROSCOPIC
Bilirubin Urine: NEGATIVE
Glucose, UA: NEGATIVE mg/dL
Hgb urine dipstick: NEGATIVE
Ketones, ur: NEGATIVE mg/dL
Leukocytes, UA: NEGATIVE
Nitrite: NEGATIVE
Protein, ur: NEGATIVE mg/dL
Specific Gravity, Urine: 1.008 (ref 1.005–1.030)
Urobilinogen, UA: 0.2 mg/dL (ref 0.0–1.0)
pH: 6 (ref 5.0–8.0)

## 2014-05-15 LAB — CBC
HCT: 35.7 % — ABNORMAL LOW (ref 39.0–52.0)
Hemoglobin: 10.7 g/dL — ABNORMAL LOW (ref 13.0–17.0)
MCH: 30.1 pg (ref 26.0–34.0)
MCHC: 30 g/dL (ref 30.0–36.0)
MCV: 100.6 fL — ABNORMAL HIGH (ref 78.0–100.0)
Platelets: 132 10*3/uL — ABNORMAL LOW (ref 150–400)
RBC: 3.55 MIL/uL — ABNORMAL LOW (ref 4.22–5.81)
RDW: 14.2 % (ref 11.5–15.5)
WBC: 5.5 10*3/uL (ref 4.0–10.5)

## 2014-05-15 LAB — RAPID URINE DRUG SCREEN, HOSP PERFORMED
Amphetamines: NOT DETECTED
Barbiturates: NOT DETECTED
Benzodiazepines: POSITIVE — AB
Cocaine: NOT DETECTED
Opiates: NOT DETECTED
Tetrahydrocannabinol: POSITIVE — AB

## 2014-05-15 LAB — PROTIME-INR
INR: 0.95 (ref 0.00–1.49)
Prothrombin Time: 12.7 seconds (ref 11.6–15.2)

## 2014-05-15 LAB — ETHANOL: Alcohol, Ethyl (B): <11 mg/dL (ref 0–11)

## 2014-05-15 MED ORDER — TRAZODONE HCL 50 MG PO TABS
100.0000 mg | ORAL_TABLET | Freq: Every day | ORAL | Status: DC
  Administered 2014-05-15: 100 mg via ORAL
  Filled 2014-05-15: qty 2

## 2014-05-15 MED ORDER — RISPERIDONE 0.5 MG PO TBDP
0.5000 mg | ORAL_TABLET | Freq: Every day | ORAL | Status: DC
  Administered 2014-05-15: 0.5 mg via ORAL
  Filled 2014-05-15 (×2): qty 1

## 2014-05-15 MED ORDER — SIMVASTATIN 10 MG PO TABS
10.0000 mg | ORAL_TABLET | Freq: Every day | ORAL | Status: DC
  Administered 2014-05-15: 10 mg via ORAL
  Filled 2014-05-15 (×2): qty 1

## 2014-05-15 MED ORDER — FLUOXETINE HCL 10 MG PO CAPS
10.0000 mg | ORAL_CAPSULE | Freq: Every day | ORAL | Status: DC
  Administered 2014-05-15: 10 mg via ORAL
  Filled 2014-05-15 (×2): qty 1

## 2014-05-15 MED ORDER — OLANZAPINE 5 MG PO TBDP
15.0000 mg | ORAL_TABLET | Freq: Every day | ORAL | Status: DC
  Administered 2014-05-15: 15 mg via ORAL
  Filled 2014-05-15 (×2): qty 1

## 2014-05-15 MED ORDER — ALPRAZOLAM 0.5 MG PO TABS
1.0000 mg | ORAL_TABLET | Freq: Three times a day (TID) | ORAL | Status: DC
  Administered 2014-05-15 (×2): 1 mg via ORAL
  Filled 2014-05-15 (×3): qty 2

## 2014-05-15 MED ORDER — LAMOTRIGINE 100 MG PO TABS
100.0000 mg | ORAL_TABLET | Freq: Two times a day (BID) | ORAL | Status: DC
  Administered 2014-05-15: 100 mg via ORAL
  Filled 2014-05-15 (×3): qty 1

## 2014-05-15 MED ORDER — TAMSULOSIN HCL 0.4 MG PO CAPS: 0.4000 mg | ORAL_CAPSULE | Freq: Every day | ORAL | Status: DC

## 2014-05-15 MED ORDER — HALOPERIDOL 5 MG PO TABS: 5.0000 mg | ORAL_TABLET | Freq: Four times a day (QID) | ORAL | Status: DC | PRN

## 2014-05-15 MED ORDER — CLOPIDOGREL BISULFATE 75 MG PO TABS
75.0000 mg | ORAL_TABLET | Freq: Every day | ORAL | Status: DC
  Administered 2014-05-15: 75 mg via ORAL
  Filled 2014-05-15 (×2): qty 1

## 2014-05-15 MED ORDER — METOPROLOL SUCCINATE ER 25 MG PO TB24
25.0000 mg | ORAL_TABLET | Freq: Every day | ORAL | Status: DC
  Administered 2014-05-15: 25 mg via ORAL
  Filled 2014-05-15 (×2): qty 1
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
## 2014-05-15 NOTE — ED Notes (Signed)
Confirmed with Nutrition that they have the order for lunch tray.

## 2014-05-15 NOTE — ED Notes (Signed)
Pt is currently under consideration at [facility].

## 2014-05-15 NOTE — ED Notes (Signed)
Pt's daughter, Roselyn Reef, called. Requesting to speak to pt. Told we will call back shortly because the pt is about to have his tele-psych. [phone]

## 2014-05-15 NOTE — ED Notes (Addendum)
Tom from [place] that it may be possible for the pt to go to [facility] for treatment but the pt would need to be IVC'd before able to go over because the pt isn't competent to make decisions on his own. Dr. Tomi Bamberger made aware.
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
## 2014-05-15 NOTE — ED Notes (Signed)
Wife called. Wife wants husband committed. Explained the policies regarding IVC and VC. Told wife that we will try to get case management involved.

## 2014-05-15 NOTE — ED Notes (Signed)
Pt called his daughter Roselyn Reef back.

## 2014-05-15 NOTE — ED Notes (Signed)
Patient transported to CT 
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
## 2014-05-15 NOTE — ED Notes (Signed)
Pt getting up out of bed, pacing around the room and moving furniture around.

## 2014-05-15 NOTE — BH Assessment (Signed)
[facility] Assessment Progress Note  The following facilities have been called in an effort to place this pt, with results as noted:  *[facility]: Peggy recommended that I send referral; at 17:25 Shirlee Limerick confirmed receipt of documentation by fax, and will review it with their provider.  At 17:53 she called back saying, "Everything looks good."  She advises me to pursue placing pt under IVC, and adds that she is waiting for her physician to call back.  I then called pt's nurse, Lanelle Bal, asking her to request that EDP initiate commitment paperwork.  Results are pending at this time.   *[place]: Threasa Beards recommended that I send referral; at 16:28 Hinton Dyer confirmed receipt of documentation by fax, and will review it with their provider. *[facility] recommended that I send referral for their doctor to review in the morning; information faxed, but receipt has not been confirmed. [facility]: at 16:49 Threasa Beards reports that they are currently at capacity, but are willing to accept referral for review on Monday, 05/18/2014; documentation faxed.  At 18:04 Pleas Koch is unable to confirm receipt, but advises that I call back tomorrow to check. *[facility]: Roderic Palau requested a mini-mental status exam, and wanted to know about pt's behavior.  Spoke to pt's nurse, Lanelle Bal, at [facility] who agrees to perform mini-mental status exam and fax it back; results faxed to [facility] along with other paperwork; at 18:30 Roderic Palau confirms receipt [facility]: per Anderson Malta at 17:35, they are at capacity. [facility]: per Janett Billow at 16:50, they have no male beds available. [facility]: per Kenney Houseman at 16:45, they are currently at capacity. *[facility]: Beth recommended that I send referral; at 17:46 Elmyra Ricks reports that they did not receive it.  Faxed again to their back line.  At 18:16 Elmyra Ricks confirms receipt, but declines pt due to diagnosis of dementia which is exclusionary for them.  Jalene Mullet,  MA Triage Specialist 05/15/2014 @ 18:35

## 2014-05-15 NOTE — BH Assessment (Addendum)
[place] Assessment Progress Note  Update @ 0820:  Received call from [facility] who stated pt med cleared.  Called pt's nurse, Lanelle Bal, and scheduled pt's tele assessment for 0830.  Called EDP Dockerty in reference to tele assessment order to see if pt med cleared for tele assessment.  She is to call this clinician back.  Shaune Pascal, MS, [facility] Licensed Professional Counselor Triage Specialist

## 2014-05-15 NOTE — ED Notes (Signed)
Telepsych in process 
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
## 2014-05-15 NOTE — ED Notes (Signed)
Telepsych completed.  

## 2014-05-16 DIAGNOSIS — I5022 Chronic systolic (congestive) heart failure: Secondary | ICD-10-CM | POA: Diagnosis not present

## 2014-05-16 DIAGNOSIS — I2510 Atherosclerotic heart disease of native coronary artery without angina pectoris: Secondary | ICD-10-CM | POA: Diagnosis not present

## 2014-05-16 DIAGNOSIS — Z886 Allergy status to analgesic agent status: Secondary | ICD-10-CM | POA: Diagnosis not present

## 2014-05-16 DIAGNOSIS — Z87891 Personal history of nicotine dependence: Secondary | ICD-10-CM | POA: Diagnosis not present

## 2014-05-16 DIAGNOSIS — R413 Other amnesia: Secondary | ICD-10-CM | POA: Diagnosis present

## 2014-05-16 DIAGNOSIS — N183 Chronic kidney disease, stage 3 unspecified: Secondary | ICD-10-CM | POA: Diagnosis not present

## 2014-05-16 DIAGNOSIS — Z9119 Patient's noncompliance with other medical treatment and regimen: Secondary | ICD-10-CM | POA: Diagnosis not present

## 2014-05-16 DIAGNOSIS — I442 Atrioventricular block, complete: Secondary | ICD-10-CM | POA: Diagnosis present

## 2014-05-16 DIAGNOSIS — N138 Other obstructive and reflux uropathy: Secondary | ICD-10-CM | POA: Diagnosis present

## 2014-05-16 DIAGNOSIS — Z86718 Personal history of other venous thrombosis and embolism: Secondary | ICD-10-CM | POA: Diagnosis not present

## 2014-05-16 DIAGNOSIS — I9589 Other hypotension: Secondary | ICD-10-CM | POA: Diagnosis not present

## 2014-05-16 DIAGNOSIS — F29 Unspecified psychosis not due to a substance or known physiological condition: Secondary | ICD-10-CM | POA: Diagnosis not present

## 2014-05-16 DIAGNOSIS — Z91199 Patient's noncompliance with other medical treatment and regimen due to unspecified reason: Secondary | ICD-10-CM | POA: Diagnosis not present

## 2014-05-16 DIAGNOSIS — F03918 Unspecified dementia, unspecified severity, with other behavioral disturbance: Secondary | ICD-10-CM | POA: Diagnosis present

## 2014-05-16 DIAGNOSIS — Z885 Allergy status to narcotic agent status: Secondary | ICD-10-CM | POA: Diagnosis not present

## 2014-05-16 DIAGNOSIS — N179 Acute kidney failure, unspecified: Secondary | ICD-10-CM | POA: Diagnosis not present

## 2014-05-16 DIAGNOSIS — N401 Enlarged prostate with lower urinary tract symptoms: Secondary | ICD-10-CM | POA: Diagnosis present

## 2014-05-16 DIAGNOSIS — N189 Chronic kidney disease, unspecified: Secondary | ICD-10-CM | POA: Diagnosis not present

## 2014-05-16 DIAGNOSIS — H5316 Psychophysical visual disturbances: Secondary | ICD-10-CM | POA: Diagnosis present

## 2014-05-16 DIAGNOSIS — I7090 Unspecified atherosclerosis: Secondary | ICD-10-CM | POA: Diagnosis not present

## 2014-05-16 DIAGNOSIS — F0391 Unspecified dementia with behavioral disturbance: Secondary | ICD-10-CM | POA: Diagnosis present

## 2014-05-16 DIAGNOSIS — E785 Hyperlipidemia, unspecified: Secondary | ICD-10-CM | POA: Diagnosis present

## 2014-05-16 DIAGNOSIS — R319 Hematuria, unspecified: Secondary | ICD-10-CM | POA: Diagnosis not present

## 2014-05-16 DIAGNOSIS — F0390 Unspecified dementia without behavioral disturbance: Secondary | ICD-10-CM | POA: Diagnosis present

## 2014-05-16 DIAGNOSIS — M129 Arthropathy, unspecified: Secondary | ICD-10-CM | POA: Diagnosis present

## 2014-05-16 DIAGNOSIS — I509 Heart failure, unspecified: Secondary | ICD-10-CM | POA: Diagnosis present

## 2014-05-16 DIAGNOSIS — N184 Chronic kidney disease, stage 4 (severe): Secondary | ICD-10-CM | POA: Diagnosis present

## 2014-05-16 DIAGNOSIS — R45850 Homicidal ideations: Secondary | ICD-10-CM | POA: Diagnosis not present

## 2014-05-16 DIAGNOSIS — Z950 Presence of cardiac pacemaker: Secondary | ICD-10-CM | POA: Diagnosis not present

## 2014-05-16 DIAGNOSIS — Z9183 Wandering in diseases classified elsewhere: Secondary | ICD-10-CM | POA: Diagnosis present

## 2014-05-16 DIAGNOSIS — F319 Bipolar disorder, unspecified: Secondary | ICD-10-CM | POA: Diagnosis present

## 2014-05-16 DIAGNOSIS — E232 Diabetes insipidus: Secondary | ICD-10-CM | POA: Diagnosis present

## 2014-05-16 DIAGNOSIS — D638 Anemia in other chronic diseases classified elsewhere: Secondary | ICD-10-CM | POA: Diagnosis not present

## 2014-05-16 DIAGNOSIS — D61811 Other drug-induced pancytopenia: Secondary | ICD-10-CM | POA: Diagnosis not present

## 2014-05-16 NOTE — ED Notes (Signed)
PATIENT ANXIOUS. HE REFUSES TO STAY IN ROOM. STATES HE WANTS TO "CHECK OUT THE SPIDERS". PT HAVING VISUAL HALLUCINATIONS THAT THERE ARE SPIDERS ON THE WALL NEAR THE CASE MANAGEMENT OFFICE. SECURITY OVER TO STAND BY AND ASSIST WITH REDIRECTION OF PT

## 2014-05-16 NOTE — ED Provider Notes (Signed)
Pt stable, eating breakfast Will be transferred  The patient appears reasonably stabilized for transfer considering the current resources, flow, and capabilities available in the ED at this time, and I doubt any other [facility] requiring further screening and/or treatment in the ED prior to transfer. BP 132/92  Pulse 75  Temp(Src) 98 F (36.7 C) (Oral)  Resp 20  Ht 5\' 9"  (1.753 m)  Wt 155 lb (70.308 kg)  BMI 22.88 kg/m2  SpO2 98%   Sharyon Cable, MD 05/16/14 [phone]

## 2014-05-16 NOTE — ED Notes (Signed)
SHERRIFF HAS CALLED AND ARE ENROUTE TO PICK UP PT

## 2014-05-16 NOTE — ED Notes (Signed)
[facility] NOTIFIED THAT PT IS ENROUTE ON 4TH ATTEMPT TO CALL.

## 2014-05-16 NOTE — ED Notes (Signed)
MESSAGE LEFT FOR SHERRIFF ABOUT TRANSPORT

## 2014-05-16 NOTE — ED Notes (Signed)
THERE WERE NO BELONGINGS WITH PT OR IN INVENTORY

## 2014-05-21 ENCOUNTER — Ambulatory Visit (HOSPITAL_COMMUNITY): Payer: Self-pay | Admitting: Psychiatry

## 2014-06-15 ENCOUNTER — Encounter (HOSPITAL_COMMUNITY): Payer: Self-pay | Admitting: Psychiatry

## 2014-06-15 ENCOUNTER — Ambulatory Visit (INDEPENDENT_AMBULATORY_CARE_PROVIDER_SITE_OTHER): Payer: Medicare Other | Admitting: Psychiatry

## 2014-06-15 VITALS — BP 113/61 | HR 78 | Ht 70.5 in | Wt 172.2 lb

## 2014-06-15 DIAGNOSIS — F319 Bipolar disorder, unspecified: Secondary | ICD-10-CM | POA: Diagnosis not present

## 2014-06-15 MED ORDER — OLANZAPINE 20 MG PO TBDP: 20.0000 mg | ORAL_TABLET | Freq: Every day | ORAL | Status: DC

## 2014-06-15 MED ORDER — ALPRAZOLAM 0.25 MG PO TABS: 1.0000 mg | ORAL_TABLET | Freq: Three times a day (TID) | ORAL | Status: DC

## 2014-06-15 NOTE — Progress Notes (Signed)
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   Urobilinogen, UA 0.2  0.0 - 1.0 mg/dL   Nitrite NEGATIVE  NEGATIVE   Leukocytes, UA NEGATIVE  NEGATIVE   Comment: MICROSCOPIC NOT DONE ON URINES WITH NEGATIVE PROTEIN, BLOOD, LEUKOCYTES, NITRITE, OR GLUCOSE <1000 mg/dL.  BASIC METABOLIC PANEL     Status: Abnormal   Collection Time    04/29/14  4:35 AM      Result Value Ref Range   Sodium 139  137 - 147 mEq/L   Potassium 4.4  3.7 - 5.3 mEq/L   Chloride 108  96 - 112 mEq/L   CO2 18 (*) 19 - 32 mEq/L    Glucose, Bld 95  70 - 99 mg/dL   BUN 23  6 - 23 mg/dL   Creatinine, Ser 2.28 (*) 0.50 - 1.35 mg/dL   Calcium 9.5  8.4 - 10.5 mg/dL   GFR calc non Af Amer 29 (*) >90 mL/min   GFR calc Af Amer 33 (*) >90 mL/min   Comment: (NOTE)     The eGFR has been calculated using the CKD EPI equation.     This calculation has not been validated in all clinical situations.     eGFR's persistently <90 mL/min signify possible Chronic Kidney     Disease.   Anion gap 13  5 - 15  CBC     Status: Abnormal   Collection Time    04/29/14  4:35 AM      Result Value Ref Range   WBC 4.6  4.0 - 10.5 K/uL   RBC 3.21 (*) 4.22 - 5.81 MIL/uL   Hemoglobin 9.9 (*) 13.0 - 17.0 g/dL   HCT 31.5 (*) 39.0 - 52.0 %   MCV 98.1  78.0 - 100.0 fL   MCH 30.8  26.0 - 34.0 pg   MCHC 31.4  30.0 - 36.0 g/dL   RDW 14.9  11.5 - 15.5 %   Platelets 191  150 - 400 K/uL  VITAMIN B12     Status: None   Collection Time    04/29/14 10:30 AM      Result Value Ref Range   Vitamin B-12 444  211 - 911 pg/mL   Comment: Performed at [facility]  TSH     Status: None   Collection Time    04/29/14 10:30 AM      Result Value Ref Range   TSH 0.644  0.350 - 4.500 uIU/mL   Comment: Performed at [place] PANEL     Status: Abnormal   Collection Time    04/30/14  4:24 AM      Result Value Ref Range   Sodium 140  137 - 147 mEq/L   Potassium 4.1  3.7 - 5.3 mEq/L   Chloride 106  96 - 112 mEq/L   CO2 20  19 - 32 mEq/L   Glucose, Bld 100 (*) 70 - 99 mg/dL   BUN 22  6 - 23 mg/dL   Creatinine, Ser 2.27 (*) 0.50 - 1.35 mg/dL   Calcium 9.9  8.4 - 10.5 mg/dL   GFR calc non  Af Amer 29 (*) >90 mL/min   GFR calc Af Amer 33 (*) >90 mL/min   Comment: (NOTE)     The eGFR has been calculated using the CKD EPI equation.     This calculation has not been validated in all clinical situations.     eGFR's persistently <90 mL/min signify possible Chronic Kidney     Disease.   Anion gap 14  5 - 15  GLUCOSE, CAPILLARY      Status: None   Collection Time    04/30/14  8:53 PM      Result Value Ref Range   Glucose-Capillary 90  70 - 99 mg/dL   Comment 1 Notify RN    GLUCOSE, CAPILLARY     Status: None   Collection Time    05/01/14 12:07 AM      Result Value Ref Range   Glucose-Capillary 96  70 - 99 mg/dL   Comment 1 Notify RN    BASIC METABOLIC PANEL     Status: Abnormal   Collection Time    05/04/14  4:08 AM      Result Value Ref Range   Sodium 140  137 - 147 mEq/L   Potassium 3.8  3.7 - 5.3 mEq/L   Chloride 106  96 - 112 mEq/L   CO2 23  19 - 32 mEq/L   Glucose, Bld 100 (*) 70 - 99 mg/dL   BUN 25 (*) 6 - 23 mg/dL   Creatinine, Ser 2.40 (*) 0.50 - 1.35 mg/dL   Calcium 9.5  8.4 - 10.5 mg/dL   GFR calc non Af Amer 27 (*) >90 mL/min   GFR calc Af Amer 31 (*) >90 mL/min   Comment: (NOTE)     The eGFR has been calculated using the CKD EPI equation.     This calculation has not been validated in all clinical situations.     eGFR's persistently <90 mL/min signify possible Chronic Kidney     Disease.   Anion gap 11  5 - 15  CBC     Status: Abnormal   Collection Time    05/04/14  4:08 AM      Result Value Ref Range   WBC 4.4  4.0 - 10.5 K/uL   RBC 3.24 (*) 4.22 - 5.81 MIL/uL   Hemoglobin 10.1 (*) 13.0 - 17.0 g/dL   HCT 32.2 (*) 39.0 - 52.0 %   MCV 99.4  78.0 - 100.0 fL   MCH 31.2  26.0 - 34.0 pg   MCHC 31.4  30.0 - 36.0 g/dL   RDW 14.3  11.5 - 15.5 %   Platelets 151  150 - 400 K/uL  CBC     Status: Abnormal   Collection Time    05/05/14 12:09 AM      Result Value Ref Range   WBC 4.1  4.0 - 10.5 K/uL   RBC 3.45 (*) 4.22 - 5.81 MIL/uL   Hemoglobin 10.7 (*) 13.0 - 17.0 g/dL   HCT 33.5 (*) 39.0 - 52.0 %   MCV 97.1  78.0 - 100.0 fL   MCH 31.0  26.0 - 34.0 pg   MCHC 31.9  30.0 - 36.0 g/dL   RDW 14.1  11.5 - 15.5 %   Platelets 145 (*) 150 - 400 K/uL  BASIC METABOLIC PANEL     Status: Abnormal   Collection Time    05/05/14 12:09 AM      Result Value Ref Range   Sodium 137  137 - 147 mEq/L    Potassium 4.2  3.7 - 5.3 mEq/L   Chloride 104  96 - 112 mEq/L   CO2 20  19 - 32 mEq/L   Glucose, Bld 131 (*) 70 - 99 mg/dL   BUN 27 (*) 6 - 23 mg/dL   Creatinine, Ser 2.30 (*) 0.50 - 1.35 mg/dL   Calcium 9.8  8.4 - 10.5 mg/dL   GFR calc non Af Amer 28 (*) >90 mL/min   GFR calc Af Amer 33 (*) >90 mL/min   Comment: (NOTE)     The eGFR has been calculated using the CKD EPI equation.     This calculation has not been validated in all clinical situations.     eGFR's persistently <90 mL/min signify possible Chronic Kidney     Disease.   Anion gap 13  5 - 15  BASIC METABOLIC PANEL     Status: Abnormal   Collection Time    05/06/14  4:55 AM      Result Value Ref Range   Sodium 140  137 - 147 mEq/L   Potassium 3.9  3.7 - 5.3 mEq/L   Chloride 105  96 - 112 mEq/L   CO2 20  19 - 32 mEq/L   Glucose, Bld 112 (*) 70 - 99 mg/dL   BUN 27 (*) 6 - 23 mg/dL   Creatinine, Ser 2.65 (*) 0.50 - 1.35 mg/dL   Calcium 9.9  8.4 - 10.5 mg/dL   GFR calc non Af Amer 24 (*) >90 mL/min   GFR calc Af Amer 28 (*) >90 mL/min   Comment: (NOTE)     The eGFR has been calculated using the CKD EPI equation.     This calculation has not been validated in all clinical situations.     eGFR's persistently <90 mL/min signify possible Chronic Kidney     Disease.   Anion gap 15  5 - 15  CBC     Status: Abnormal   Collection Time    05/06/14  4:55 AM      Result Value Ref Range   WBC 4.1  4.0 - 10.5 K/uL   RBC 3.54 (*) 4.22 - 5.81 MIL/uL   Hemoglobin 10.8 (*) 13.0 - 17.0 g/dL   HCT 34.8 (*) 39.0 - 52.0 %   MCV 98.3  78.0 - 100.0 fL   MCH 30.5  26.0 - 34.0 pg   MCHC 31.0  30.0 - 36.0 g/dL   RDW 13.9  11.5 - 15.5 %   Platelets 140 (*) 150 - 400 K/uL  CBC     Status: Abnormal   Collection Time    05/07/14  4:02 AM      Result Value Ref Range   WBC 3.9 (*) 4.0 - 10.5 K/uL   RBC 3.17 (*) 4.22 - 5.81 MIL/uL   Hemoglobin 9.8 (*) 13.0 - 17.0 g/dL   HCT 31.2 (*) 39.0 - 52.0 %   MCV 98.4  78.0 - 100.0 fL   MCH 30.9   26.0 - 34.0 pg   MCHC 31.4  30.0 - 36.0 g/dL   RDW 14.1  11.5 - 15.5 %   Platelets 126 (*) 150 - 400 K/uL  BASIC METABOLIC PANEL     Status: Abnormal   Collection Time    05/07/14  4:02 AM      Result Value Ref Range   Sodium 140  137 - 147 mEq/L   Potassium 4.2  3.7 - 5.3 mEq/L  Chloride 108  96 - 112 mEq/L   CO2 21  19 - 32 mEq/L   Glucose, Bld 90  70 - 99 mg/dL   BUN 24 (*) 6 - 23 mg/dL   Creatinine, Ser 1.75 (*) 0.50 - 1.35 mg/dL   Calcium 9.4  8.4 - 12.0 mg/dL   GFR calc non Af Amer 27 (*) >90 mL/min   GFR calc Af Amer 32 (*) >90 mL/min   Comment: (NOTE)     The eGFR has been calculated using the CKD EPI equation.     This calculation has not been validated in all clinical situations.     eGFR's persistently <90 mL/min signify possible Chronic Kidney     Disease.   Anion gap 11  5 - 15  CBC     Status: Abnormal   Collection Time    05/08/14  4:41 AM      Result Value Ref Range   WBC 3.7 (*) 4.0 - 10.5 K/uL   RBC 3.16 (*) 4.22 - 5.81 MIL/uL   Hemoglobin 9.8 (*) 13.0 - 17.0 g/dL   HCT 47.3 (*) 72.5 - 66.9 %   MCV 98.4  78.0 - 100.0 fL   MCH 31.0  26.0 - 34.0 pg   MCHC 31.5  30.0 - 36.0 g/dL   RDW 05.0  39.9 - 71.3 %   Platelets 121 (*) 150 - 400 K/uL  BASIC METABOLIC PANEL     Status: Abnormal   Collection Time    05/08/14  4:41 AM      Result Value Ref Range   Sodium 140  137 - 147 mEq/L   Potassium 3.9  3.7 - 5.3 mEq/L   Chloride 105  96 - 112 mEq/L   CO2 22  19 - 32 mEq/L   Glucose, Bld 86  70 - 99 mg/dL   BUN 21  6 - 23 mg/dL   Creatinine, Ser 7.34 (*) 0.50 - 1.35 mg/dL   Calcium 9.6  8.4 - 56.3 mg/dL   GFR calc non Af Amer 27 (*) >90 mL/min   GFR calc Af Amer 31 (*) >90 mL/min   Comment: (NOTE)     The eGFR has been calculated using the CKD EPI equation.     This calculation has not been validated in all clinical situations.     eGFR's persistently <90 mL/min signify possible Chronic Kidney     Disease.   Anion gap 13  5 - 15  CBC     Status:  Abnormal   Collection Time    05/09/14  4:20 AM      Result Value Ref Range   WBC 4.4  4.0 - 10.5 K/uL   RBC 3.53 (*) 4.22 - 5.81 MIL/uL   Hemoglobin 10.9 (*) 13.0 - 17.0 g/dL   HCT 46.1 (*) 71.8 - 54.1 %   MCV 98.9  78.0 - 100.0 fL   MCH 30.9  26.0 - 34.0 pg   MCHC 31.2  30.0 - 36.0 g/dL   RDW 53.1  60.3 - 80.3 %   Platelets 121 (*) 150 - 400 K/uL  BASIC METABOLIC PANEL     Status: Abnormal   Collection Time    05/09/14  4:20 AM      Result Value Ref Range   Sodium 140  137 - 147 mEq/L   Potassium 4.5  3.7 - 5.3 mEq/L   Chloride 104  96 - 112 mEq/L   CO2 23  19 - 32  mEq/L   Glucose, Bld 96  70 - 99 mg/dL   BUN 24 (*) 6 - 23 mg/dL   Creatinine, Ser 2.55 (*) 0.50 - 1.35 mg/dL   Calcium 10.0  8.4 - 10.5 mg/dL   GFR calc non Af Amer 25 (*) >90 mL/min   GFR calc Af Amer 29 (*) >90 mL/min   Comment: (NOTE)     The eGFR has been calculated using the CKD EPI equation.     This calculation has not been validated in all clinical situations.     eGFR's persistently <90 mL/min signify possible Chronic Kidney     Disease.   Anion gap 13  5 - 15    Outpatient Encounter Prescriptions as of 06/15/2014  Medication Sig  . lamoTRIgine (LAMICTAL) 100 MG tablet Take 100 mg by mouth daily. Start 50 mg daily for 1 week and than 100 mg daily  . traZODone (DESYREL) 50 MG tablet Take 50 mg by mouth at bedtime.  . ALPRAZolam (XANAX) 0.25 MG tablet Take 4 tablets (1 mg total) by mouth 3 (three) times daily.  . clopidogrel (PLAVIX) 75 MG tablet Take 75 mg by mouth every morning.   . lamoTRIgine (LAMICTAL) 100 MG tablet Take 1 tablet (100 mg total) by mouth 2 (two) times daily.  . metoprolol succinate (TOPROL-XL) 25 MG 24 hr tablet Take 25 mg by mouth every morning.   . olanzapine zydis (ZYPREXA) 20 MG disintegrating tablet Take 1 tablet (20 mg total) by mouth at bedtime.  . simvastatin (ZOCOR) 10 MG tablet Take 10 mg by mouth at bedtime.  . tamsulosin (FLOMAX) 0.4 MG CAPS Take 0.4 mg by mouth every  morning.   . [DISCONTINUED] ALPRAZolam (XANAX) 1 MG tablet Take 1 mg by mouth 3 (three) times daily.  . [DISCONTINUED] haloperidol (HALDOL) 5 MG tablet Take 1 tablet (5 mg total) by mouth every 6 (six) hours as needed for agitation.  . [DISCONTINUED] olanzapine zydis (ZYPREXA) 15 MG disintegrating tablet Take 1 tablet (15 mg total) by mouth at bedtime.  . [DISCONTINUED] risperiDONE (RISPERDAL M-TABS) 0.5 MG disintegrating tablet Take 1 tablet (0.5 mg total) by mouth at bedtime.  . [DISCONTINUED] traZODone (DESYREL) 100 MG tablet Take 100 mg by mouth at bedtime.    Past Psychiatric History/Hospitalization(s): Anxiety: Yes Bipolar Disorder: Yes Depression: Yes Mania: Yes Psychosis: Yes Schizophrenia: No Personality Disorder: No Hospitalization for psychiatric illness: Patient was recently admitted because of lithium toxicity on the medical floor and then he was again admitted to [facility] because of worsening of the symptoms.  He had a good response to the lithium however it was discontinued because of high creatinine.  He is seen in this office since 2007.  He has an earlier admission at [facility] in 2006 because of mania.  In the past he has taken Abilify, Risperdal, Lexapro, Wellbutrin and recently he was given Navane.  Patient has history of previous suicidal attempt, psychosis, mania and hospitalization.  Physical Exam: Constitutional:  BP 113/61  Pulse 78  Ht 5' 10.5" (1.791 m)  Wt 172 lb 3.2 oz (78.109 kg)  BMI 24.35 kg/m2  General Appearance: well nourished and Casually dressed.  He maintained fair eye contact.  Musculoskeletal: Strength & Muscle Tone: within normal limits Gait & Station: He has some difficulty walking because of pain. Patient leans: N/A   Mental Status Examination; Patient is casually dressed.  He maintained fair eye contact.  His speech is fast and at times rambling.  he is  superficially cooperative.  His thought processes  circumstantial.  His attention and concentration is distracted.  He denies any paranoia, hallucination or any obsessive thoughts.  He denies any auditory or visual hallucination.  He appears restless and his psychomotor activity is slightly increased.  He described his mood is okay and his affect is labile.  There were no delusions at this time.  He has difficulty organizing his thoughts.  He is alert and oriented x3 however he has difficulty remembering things.  His fund of knowledge is adequate.  His insight judgment and impulse control is okay.   Established Problem, Stable/Improving (1), Jeffrey problem, with additional work up planned, Review of Psycho-Social Stressors (1), Review or order clinical lab tests (1), Decision to obtain old records (1), Review and summation of old records (2), Review of Last Therapy Session (1), Review of Medication Regimen & Side Effects (2) and Review of Jeffrey Medication or Change in Dosage (2)  Assessment: Axis I: Bipolar disorder NOS  Axis II: Deferred  Axis III: See medical history  Axis IV: Mild to moderate  Axis V: 60-65   Plan:  I reviewed blood results, discharge summary from medical floor and from [facility]  , psychosocial stressors and his current medication.  recommended to try olanzapine 20 mg at bedtime, to start Lamictal 50 mg daily and then 100 mg daily , reduce trazodone 50 mg at bedtime and Xanax 0.5 mg 3 times a day.  Discussed in detail the risks and benefits of medication especially if he developed any rash with the Lamictal that he needed to stop the medication immediately.  Patient had taken Lamictal 200 mg in the past without any side effects.  Patient is no longer taking Prozac , Risperdal and Haldol.  Recommended to call us back if there is any question of a concern.  I will see him again in 4 weeks. Time spent 25 minutes.  More than 50% of the time spent in psychoeducation, counseling and coordination of care.  Discuss safety plan  that anytime having active suicidal thoughts or homicidal thoughts then patient need to call 911 or go to the local emergency room.    Amaiah Cristiano T., MD 06/15/2014

## 2014-06-15 NOTE — Patient Instructions (Signed)
Reduce olanzapine 20 mg at bedtime. Reduce trazodone 50 mg at bedtime Start Lamictal 50 mg tablet for 2 weeks and then 100 mg daily. Take Xanax 0.25 mg 3 times a day

## 2014-06-23 ENCOUNTER — Encounter (HOSPITAL_COMMUNITY): Payer: Self-pay | Admitting: *Deleted

## 2014-06-23 ENCOUNTER — Telehealth (HOSPITAL_COMMUNITY): Payer: Self-pay | Admitting: *Deleted

## 2014-06-24 ENCOUNTER — Telehealth (HOSPITAL_COMMUNITY): Payer: Self-pay | Admitting: *Deleted

## 2014-06-24 ENCOUNTER — Encounter (HOSPITAL_COMMUNITY): Payer: Self-pay | Admitting: *Deleted

## 2014-06-24 NOTE — Telephone Encounter (Signed)
Wife faxed Jury summons with request for MD to write letter to excuse her husband. MD requested letter be prepared

## 2014-06-24 NOTE — Telephone Encounter (Signed)
Opened in [place]ro

## 2014-06-26 ENCOUNTER — Encounter (HOSPITAL_COMMUNITY): Payer: Self-pay | Admitting: Psychiatry

## 2014-07-01 NOTE — Telephone Encounter (Signed)
Error

## 2014-07-16 ENCOUNTER — Encounter (HOSPITAL_COMMUNITY): Payer: Self-pay | Admitting: Psychiatry

## 2014-07-16 ENCOUNTER — Ambulatory Visit (INDEPENDENT_AMBULATORY_CARE_PROVIDER_SITE_OTHER): Payer: Medicare Other | Admitting: Psychiatry

## 2014-07-16 VITALS — BP 122/71 | HR 90 | Ht 70.5 in | Wt 174.0 lb

## 2014-07-16 DIAGNOSIS — F319 Bipolar disorder, unspecified: Secondary | ICD-10-CM

## 2014-07-16 MED ORDER — ALPRAZOLAM 0.25 MG PO TABS: 1.0000 mg | ORAL_TABLET | Freq: Three times a day (TID) | ORAL | Status: DC

## 2014-07-16 MED ORDER — OLANZAPINE 20 MG PO TABS: 20.0000 mg | ORAL_TABLET | Freq: Every day | ORAL | Status: DC

## 2014-07-16 MED ORDER — TRAZODONE HCL 50 MG PO TABS: 50.0000 mg | ORAL_TABLET | Freq: Every day | ORAL | Status: DC

## 2014-07-16 MED ORDER — LAMOTRIGINE 100 MG PO TABS: 100.0000 mg | ORAL_TABLET | Freq: Two times a day (BID) | ORAL | Status: DC

## 2014-07-16 NOTE — Progress Notes (Signed)
[place] [phone] Progress Note  FONG MCCARRY [id number] 63 y.o.  07/16/2014 12:08 PM  Chief Complaint:  Medication management and followup.         History of Present Illness: Jeffrey Herring came for his followup appointment with his wife.  He is taking Lamictal 100 mg twice a day, Xanax 0.25 mg twice a day .  He was given Zyprexa 20 mg however his wife did not fill the prescription because it was disintegrating tablet and expansive.  She is giving him Zyprexa 15 mg 1-1/2 tablet.  Overall patient has been doing well.  There has been no aggression or violence.  He is not agitated with the neighbors .  He denies any irritability or any anger however he continues to have some time paranoia and disorganized thinking.  He has no tremors or shakes.  He has no rash or itching.  Overall his wife endorse much improvement in his behavior and sleep.  He gets sometime confusion and he requires reminders from medication compliance.  His wife endorse that patient does not take his medication at bedtime at the scheduled however she is trying to keep working on it.  Most of the information was obtained from the wife.  Patient denies any suicidal thoughts or homicidal thoughts.  His appetite is okay.  His vitals are stable.  He has not seen a nephrologist however I recommended to contact nephrologist to check his kidney function test.  The patient lives with his wife who is very supportive.  Suicidal Ideation: No Plan Formed: No Patient has means to carry out plan: No  Homicidal Ideation: No Plan Formed: No Patient has means to carry out plan: No  Review of Systems: Psychiatric: Agitation: No Hallucination: No Depressed Mood: No Insomnia: Yes Hypersomnia: No Altered Concentration: Episodes of confusion Feels Worthless: No Grandiose Ideas: No Belief In Special Powers: No New/Increased Substance Abuse: No Compulsions: No  Neurologic: Headache: No Seizure: No Paresthesias: No  Medical  History; Patient has history of arrhythmia, history of pulmonary embolism and DVT, history of nephrolithiasis , benign prostate hypertrophy, history of chronic kidney disease , anemia and hyperlipidemia.  He has a pacemaker .  patient was admitted recently because of lithium toxicity. Recent Results (from the past 2160 hour(s))  RENAL FUNCTION PANEL     Status: Abnormal   Collection Time    04/18/14  4:17 AM      Result Value Ref Range   Sodium 147  137 - 147 mEq/L   Potassium 4.1  3.7 - 5.3 mEq/L   Chloride 110  96 - 112 mEq/L   CO2 23  19 - 32 mEq/L   Glucose, Bld 90  70 - 99 mg/dL   BUN 17  6 - 23 mg/dL   Creatinine, Ser 2.04 (*) 0.50 - 1.35 mg/dL   Calcium 9.5  8.4 - 10.5 mg/dL   Phosphorus 3.6  2.3 - 4.6 mg/dL   Albumin 3.2 (*) 3.5 - 5.2 g/dL   GFR calc non Af Amer 33 (*) >90 mL/min   GFR calc Af Amer 38 (*) >90 mL/min   Comment: (NOTE)     The eGFR has been calculated using the CKD EPI equation.     This calculation has not been validated in all clinical situations.     eGFR's persistently <90 mL/min signify possible Chronic Kidney     Disease.   Anion gap 14  5 - 15  RENAL FUNCTION PANEL     Status: Abnormal  Collection Time    04/19/14  5:02 AM      Result Value Ref Range   Sodium 144  137 - 147 mEq/L   Potassium 3.9  3.7 - 5.3 mEq/L   Chloride 109  96 - 112 mEq/L   CO2 22  19 - 32 mEq/L   Glucose, Bld 106 (*) 70 - 99 mg/dL   BUN 14  6 - 23 mg/dL   Creatinine, Ser 2.00 (*) 0.50 - 1.35 mg/dL   Calcium 9.0  8.4 - 10.5 mg/dL   Phosphorus 3.1  2.3 - 4.6 mg/dL   Albumin 3.0 (*) 3.5 - 5.2 g/dL   GFR calc non Af Amer 34 (*) >90 mL/min   GFR calc Af Amer 39 (*) >90 mL/min   Comment: (NOTE)     The eGFR has been calculated using the CKD EPI equation.     This calculation has not been validated in all clinical situations.     eGFR's persistently <90 mL/min signify possible Chronic Kidney     Disease.   Anion gap 13  5 - 15  BASIC METABOLIC PANEL     Status: Abnormal    Collection Time    04/21/14  9:15 AM      Result Value Ref Range   Sodium 145  137 - 147 mEq/L   Potassium 4.2  3.7 - 5.3 mEq/L   Chloride 109  96 - 112 mEq/L   CO2 21  19 - 32 mEq/L   Glucose, Bld 117 (*) 70 - 99 mg/dL   BUN 13  6 - 23 mg/dL   Creatinine, Ser 1.98 (*) 0.50 - 1.35 mg/dL   Calcium 9.4  8.4 - 10.5 mg/dL   GFR calc non Af Amer 34 (*) >90 mL/min   GFR calc Af Amer 39 (*) >90 mL/min   Comment: (NOTE)     The eGFR has been calculated using the CKD EPI equation.     This calculation has not been validated in all clinical situations.     eGFR's persistently <90 mL/min signify possible Chronic Kidney     Disease.   Anion gap 15  5 - 15  CBC     Status: Abnormal   Collection Time    04/22/14  6:06 AM      Result Value Ref Range   WBC 3.9 (*) 4.0 - 10.5 K/uL   RBC 2.93 (*) 4.22 - 5.81 MIL/uL   Hemoglobin 8.9 (*) 13.0 - 17.0 g/dL   HCT 29.4 (*) 39.0 - 52.0 %   MCV 100.3 (*) 78.0 - 100.0 fL   MCH 30.4  26.0 - 34.0 pg   MCHC 30.3  30.0 - 36.0 g/dL   RDW 14.9  11.5 - 15.5 %   Platelets 133 (*) 150 - 400 K/uL  BASIC METABOLIC PANEL     Status: Abnormal   Collection Time    04/22/14  6:06 AM      Result Value Ref Range   Sodium 145  137 - 147 mEq/L   Potassium 4.0  3.7 - 5.3 mEq/L   Chloride 109  96 - 112 mEq/L   CO2 23  19 - 32 mEq/L   Glucose, Bld 95  70 - 99 mg/dL   BUN 13  6 - 23 mg/dL   Creatinine, Ser 2.00 (*) 0.50 - 1.35 mg/dL   Calcium 9.1  8.4 - 10.5 mg/dL   GFR calc non Af Amer 34 (*) >90 mL/min   GFR  calc Af Amer 39 (*) >90 mL/min   Comment: (NOTE)     The eGFR has been calculated using the CKD EPI equation.     This calculation has not been validated in all clinical situations.     eGFR's persistently <90 mL/min signify possible Chronic Kidney     Disease.   Anion gap 13  5 - 15  CBC WITH DIFFERENTIAL     Status: Abnormal   Collection Time    04/28/14 12:06 PM      Result Value Ref Range   WBC 5.8  4.0 - 10.5 K/uL   RBC 3.49 (*) 4.22 - 5.81 MIL/uL    Hemoglobin 10.7 (*) 13.0 - 17.0 g/dL   HCT 33.8 (*) 39.0 - 52.0 %   MCV 96.8  78.0 - 100.0 fL   MCH 30.7  26.0 - 34.0 pg   MCHC 31.7  30.0 - 36.0 g/dL   RDW 14.9  11.5 - 15.5 %   Platelets 179  150 - 400 K/uL   Neutrophils Relative % 80 (*) 43 - 77 %   Neutro Abs 4.7  1.7 - 7.7 K/uL   Lymphocytes Relative 8 (*) 12 - 46 %   Lymphs Abs 0.5 (*) 0.7 - 4.0 K/uL   Monocytes Relative 10  3 - 12 %   Monocytes Absolute 0.6  0.1 - 1.0 K/uL   Eosinophils Relative 2  0 - 5 %   Eosinophils Absolute 0.1  0.0 - 0.7 K/uL   Basophils Relative 0  0 - 1 %   Basophils Absolute 0.0  0.0 - 0.1 K/uL  COMPREHENSIVE METABOLIC PANEL     Status: Abnormal   Collection Time    04/28/14 12:06 PM      Result Value Ref Range   Sodium 139  137 - 147 mEq/L   Potassium 4.4  3.7 - 5.3 mEq/L   Chloride 103  96 - 112 mEq/L   CO2 20  19 - 32 mEq/L   Glucose, Bld 88  70 - 99 mg/dL   BUN 25 (*) 6 - 23 mg/dL   Creatinine, Ser 2.40 (*) 0.50 - 1.35 mg/dL   Calcium 10.1  8.4 - 10.5 mg/dL   Total Protein 6.8  6.0 - 8.3 g/dL   Albumin 3.6  3.5 - 5.2 g/dL   AST 27  0 - 37 U/L   ALT 25  0 - 53 U/L   Alkaline Phosphatase 96  39 - 117 U/L   Total Bilirubin 0.3  0.3 - 1.2 mg/dL   GFR calc non Af Amer 27 (*) >90 mL/min   GFR calc Af Amer 31 (*) >90 mL/min   Comment: (NOTE)     The eGFR has been calculated using the CKD EPI equation.     This calculation has not been validated in all clinical situations.     eGFR's persistently <90 mL/min signify possible Chronic Kidney     Disease.   Anion gap 16 (*) 5 - 15  LITHIUM LEVEL     Status: Abnormal   Collection Time    04/28/14 12:06 PM      Result Value Ref Range   Lithium Lvl <0.25 (*) 0.80 - 1.40 mEq/L  URINALYSIS, ROUTINE W REFLEX MICROSCOPIC     Status: None   Collection Time    04/28/14 12:24 PM      Result Value Ref Range   Color, Urine YELLOW  YELLOW   APPearance CLEAR  CLEAR  Specific Gravity, Urine 1.008  1.005 - 1.030   pH 6.0  5.0 - 8.0   Glucose, UA  NEGATIVE  NEGATIVE mg/dL   Hgb urine dipstick NEGATIVE  NEGATIVE   Bilirubin Urine NEGATIVE  NEGATIVE   Ketones, ur NEGATIVE  NEGATIVE mg/dL   Protein, ur NEGATIVE  NEGATIVE mg/dL   Urobilinogen, UA 0.2  0.0 - 1.0 mg/dL   Nitrite NEGATIVE  NEGATIVE   Leukocytes, UA NEGATIVE  NEGATIVE   Comment: MICROSCOPIC NOT DONE ON URINES WITH NEGATIVE PROTEIN, BLOOD, LEUKOCYTES, NITRITE, OR GLUCOSE <1000 mg/dL.  BASIC METABOLIC PANEL     Status: Abnormal   Collection Time    04/29/14  4:35 AM      Result Value Ref Range   Sodium 139  137 - 147 mEq/L   Potassium 4.4  3.7 - 5.3 mEq/L   Chloride 108  96 - 112 mEq/L   CO2 18 (*) 19 - 32 mEq/L   Glucose, Bld 95  70 - 99 mg/dL   BUN 23  6 - 23 mg/dL   Creatinine, Ser 2.28 (*) 0.50 - 1.35 mg/dL   Calcium 9.5  8.4 - 10.5 mg/dL   GFR calc non Af Amer 29 (*) >90 mL/min   GFR calc Af Amer 33 (*) >90 mL/min   Comment: (NOTE)     The eGFR has been calculated using the CKD EPI equation.     This calculation has not been validated in all clinical situations.     eGFR's persistently <90 mL/min signify possible Chronic Kidney     Disease.   Anion gap 13  5 - 15  CBC     Status: Abnormal   Collection Time    04/29/14  4:35 AM      Result Value Ref Range   WBC 4.6  4.0 - 10.5 K/uL   RBC 3.21 (*) 4.22 - 5.81 MIL/uL   Hemoglobin 9.9 (*) 13.0 - 17.0 g/dL   HCT 31.5 (*) 39.0 - 52.0 %   MCV 98.1  78.0 - 100.0 fL   MCH 30.8  26.0 - 34.0 pg   MCHC 31.4  30.0 - 36.0 g/dL   RDW 14.9  11.5 - 15.5 %   Platelets 191  150 - 400 K/uL  VITAMIN B12     Status: None   Collection Time    04/29/14 10:30 AM      Result Value Ref Range   Vitamin B-12 444  211 - 911 pg/mL   Comment: Performed at [facility]  TSH     Status: None   Collection Time    04/29/14 10:30 AM      Result Value Ref Range   TSH 0.644  0.350 - 4.500 uIU/mL   Comment: Performed at [place] PANEL     Status: Abnormal   Collection Time    04/30/14  4:24 AM       Result Value Ref Range   Sodium 140  137 - 147 mEq/L   Potassium 4.1  3.7 - 5.3 mEq/L   Chloride 106  96 - 112 mEq/L   CO2 20  19 - 32 mEq/L   Glucose, Bld 100 (*) 70 - 99 mg/dL   BUN 22  6 - 23 mg/dL   Creatinine, Ser 2.27 (*) 0.50 - 1.35 mg/dL   Calcium 9.9  8.4 - 10.5 mg/dL   GFR calc non Af Amer 29 (*) >90 mL/min   GFR calc Af  Amer 33 (*) >90 mL/min   Comment: (NOTE)     The eGFR has been calculated using the CKD EPI equation.     This calculation has not been validated in all clinical situations.     eGFR's persistently <90 mL/min signify possible Chronic Kidney     Disease.   Anion gap 14  5 - 15  GLUCOSE, CAPILLARY     Status: None   Collection Time    04/30/14  8:53 PM      Result Value Ref Range   Glucose-Capillary 90  70 - 99 mg/dL   Comment 1 Notify RN    GLUCOSE, CAPILLARY     Status: None   Collection Time    05/01/14 12:07 AM      Result Value Ref Range   Glucose-Capillary 96  70 - 99 mg/dL   Comment 1 Notify RN    BASIC METABOLIC PANEL     Status: Abnormal   Collection Time    05/04/14  4:08 AM      Result Value Ref Range   Sodium 140  137 - 147 mEq/L   Potassium 3.8  3.7 - 5.3 mEq/L   Chloride 106  96 - 112 mEq/L   CO2 23  19 - 32 mEq/L   Glucose, Bld 100 (*) 70 - 99 mg/dL   BUN 25 (*) 6 - 23 mg/dL   Creatinine, Ser 2.40 (*) 0.50 - 1.35 mg/dL   Calcium 9.5  8.4 - 10.5 mg/dL   GFR calc non Af Amer 27 (*) >90 mL/min   GFR calc Af Amer 31 (*) >90 mL/min   Comment: (NOTE)     The eGFR has been calculated using the CKD EPI equation.     This calculation has not been validated in all clinical situations.     eGFR's persistently <90 mL/min signify possible Chronic Kidney     Disease.   Anion gap 11  5 - 15  CBC     Status: Abnormal   Collection Time    05/04/14  4:08 AM      Result Value Ref Range   WBC 4.4  4.0 - 10.5 K/uL   RBC 3.24 (*) 4.22 - 5.81 MIL/uL   Hemoglobin 10.1 (*) 13.0 - 17.0 g/dL   HCT 32.2 (*) 39.0 - 52.0 %   MCV 99.4  78.0 - 100.0  fL   MCH 31.2  26.0 - 34.0 pg   MCHC 31.4  30.0 - 36.0 g/dL   RDW 14.3  11.5 - 15.5 %   Platelets 151  150 - 400 K/uL  CBC     Status: Abnormal   Collection Time    05/05/14 12:09 AM      Result Value Ref Range   WBC 4.1  4.0 - 10.5 K/uL   RBC 3.45 (*) 4.22 - 5.81 MIL/uL   Hemoglobin 10.7 (*) 13.0 - 17.0 g/dL   HCT 33.5 (*) 39.0 - 52.0 %   MCV 97.1  78.0 - 100.0 fL   MCH 31.0  26.0 - 34.0 pg   MCHC 31.9  30.0 - 36.0 g/dL   RDW 14.1  11.5 - 15.5 %   Platelets 145 (*) 150 - 400 K/uL  BASIC METABOLIC PANEL     Status: Abnormal   Collection Time    05/05/14 12:09 AM      Result Value Ref Range   Sodium 137  137 - 147 mEq/L   Potassium 4.2  3.7 -  5.3 mEq/L   Chloride 104  96 - 112 mEq/L   CO2 20  19 - 32 mEq/L   Glucose, Bld 131 (*) 70 - 99 mg/dL   BUN 27 (*) 6 - 23 mg/dL   Creatinine, Ser 2.30 (*) 0.50 - 1.35 mg/dL   Calcium 9.8  8.4 - 10.5 mg/dL   GFR calc non Af Amer 28 (*) >90 mL/min   GFR calc Af Amer 33 (*) >90 mL/min   Comment: (NOTE)     The eGFR has been calculated using the CKD EPI equation.     This calculation has not been validated in all clinical situations.     eGFR's persistently <90 mL/min signify possible Chronic Kidney     Disease.   Anion gap 13  5 - 15  BASIC METABOLIC PANEL     Status: Abnormal   Collection Time    05/06/14  4:55 AM      Result Value Ref Range   Sodium 140  137 - 147 mEq/L   Potassium 3.9  3.7 - 5.3 mEq/L   Chloride 105  96 - 112 mEq/L   CO2 20  19 - 32 mEq/L   Glucose, Bld 112 (*) 70 - 99 mg/dL   BUN 27 (*) 6 - 23 mg/dL   Creatinine, Ser 2.65 (*) 0.50 - 1.35 mg/dL   Calcium 9.9  8.4 - 10.5 mg/dL   GFR calc non Af Amer 24 (*) >90 mL/min   GFR calc Af Amer 28 (*) >90 mL/min   Comment: (NOTE)     The eGFR has been calculated using the CKD EPI equation.     This calculation has not been validated in all clinical situations.     eGFR's persistently <90 mL/min signify possible Chronic Kidney     Disease.   Anion gap 15  5 - 15   CBC     Status: Abnormal   Collection Time    05/06/14  4:55 AM      Result Value Ref Range   WBC 4.1  4.0 - 10.5 K/uL   RBC 3.54 (*) 4.22 - 5.81 MIL/uL   Hemoglobin 10.8 (*) 13.0 - 17.0 g/dL   HCT 34.8 (*) 39.0 - 52.0 %   MCV 98.3  78.0 - 100.0 fL   MCH 30.5  26.0 - 34.0 pg   MCHC 31.0  30.0 - 36.0 g/dL   RDW 13.9  11.5 - 15.5 %   Platelets 140 (*) 150 - 400 K/uL  CBC     Status: Abnormal   Collection Time    05/07/14  4:02 AM      Result Value Ref Range   WBC 3.9 (*) 4.0 - 10.5 K/uL   RBC 3.17 (*) 4.22 - 5.81 MIL/uL   Hemoglobin 9.8 (*) 13.0 - 17.0 g/dL   HCT 31.2 (*) 39.0 - 52.0 %   MCV 98.4  78.0 - 100.0 fL   MCH 30.9  26.0 - 34.0 pg   MCHC 31.4  30.0 - 36.0 g/dL   RDW 14.1  11.5 - 15.5 %   Platelets 126 (*) 150 - 400 K/uL  BASIC METABOLIC PANEL     Status: Abnormal   Collection Time    05/07/14  4:02 AM      Result Value Ref Range   Sodium 140  137 - 147 mEq/L   Potassium 4.2  3.7 - 5.3 mEq/L   Chloride 108  96 - 112 mEq/L   CO2 21  19 - 32 mEq/L   Glucose, Bld 90  70 - 99 mg/dL   BUN 24 (*) 6 - 23 mg/dL   Creatinine, Ser 2.37 (*) 0.50 - 1.35 mg/dL   Calcium 9.4  8.4 - 10.5 mg/dL   GFR calc non Af Amer 27 (*) >90 mL/min   GFR calc Af Amer 32 (*) >90 mL/min   Comment: (NOTE)     The eGFR has been calculated using the CKD EPI equation.     This calculation has not been validated in all clinical situations.     eGFR's persistently <90 mL/min signify possible Chronic Kidney     Disease.   Anion gap 11  5 - 15  CBC     Status: Abnormal   Collection Time    05/08/14  4:41 AM      Result Value Ref Range   WBC 3.7 (*) 4.0 - 10.5 K/uL   RBC 3.16 (*) 4.22 - 5.81 MIL/uL   Hemoglobin 9.8 (*) 13.0 - 17.0 g/dL   HCT 31.1 (*) 39.0 - 52.0 %   MCV 98.4  78.0 - 100.0 fL   MCH 31.0  26.0 - 34.0 pg   MCHC 31.5  30.0 - 36.0 g/dL   RDW 14.0  11.5 - 15.5 %   Platelets 121 (*) 150 - 400 K/uL  BASIC METABOLIC PANEL     Status: Abnormal   Collection Time    05/08/14  4:41 AM       Result Value Ref Range   Sodium 140  137 - 147 mEq/L   Potassium 3.9  3.7 - 5.3 mEq/L   Chloride 105  96 - 112 mEq/L   CO2 22  19 - 32 mEq/L   Glucose, Bld 86  70 - 99 mg/dL   BUN 21  6 - 23 mg/dL   Creatinine, Ser 2.41 (*) 0.50 - 1.35 mg/dL   Calcium 9.6  8.4 - 10.5 mg/dL   GFR calc non Af Amer 27 (*) >90 mL/min   GFR calc Af Amer 31 (*) >90 mL/min   Comment: (NOTE)     The eGFR has been calculated using the CKD EPI equation.     This calculation has not been validated in all clinical situations.     eGFR's persistently <90 mL/min signify possible Chronic Kidney     Disease.   Anion gap 13  5 - 15  CBC     Status: Abnormal   Collection Time    05/09/14  4:20 AM      Result Value Ref Range   WBC 4.4  4.0 - 10.5 K/uL   RBC 3.53 (*) 4.22 - 5.81 MIL/uL   Hemoglobin 10.9 (*) 13.0 - 17.0 g/dL   HCT 34.9 (*) 39.0 - 52.0 %   MCV 98.9  78.0 - 100.0 fL   MCH 30.9  26.0 - 34.0 pg   MCHC 31.2  30.0 - 36.0 g/dL   RDW 14.1  11.5 - 15.5 %   Platelets 121 (*) 150 - 400 K/uL  BASIC METABOLIC PANEL     Status: Abnormal   Collection Time    05/09/14  4:20 AM      Result Value Ref Range   Sodium 140  137 - 147 mEq/L   Potassium 4.5  3.7 - 5.3 mEq/L   Chloride 104  96 - 112 mEq/L   CO2 23  19 - 32 mEq/L   Glucose, Bld 96  70 - 99 mg/dL  BUN 24 (*) 6 - 23 mg/dL   Creatinine, Ser 2.55 (*) 0.50 - 1.35 mg/dL   Calcium 10.0  8.4 - 10.5 mg/dL   GFR calc non Af Amer 25 (*) >90 mL/min   GFR calc Af Amer 29 (*) >90 mL/min   Comment: (NOTE)     The eGFR has been calculated using the CKD EPI equation.     This calculation has not been validated in all clinical situations.     eGFR's persistently <90 mL/min signify possible Chronic Kidney     Disease.   Anion gap 13  5 - 15  PREPARE FRESH FROZEN PLASMA     Status: None   Collection Time    05/15/14  3:05 AM      Result Value Ref Range   Unit Number [id number]     Blood Component Type THW PLS APHR     Unit division 00     Status of  Unit REL FROM [facility]     Unit tag comment VERBAL ORDERS PER DR LOCKWOOD     Transfusion Status OK TO TRANSFUSE     Unit Number [id number]     Blood Component Type THAWED PLASMA     Unit division 00     Status of Unit REL FROM [facility]     Unit tag comment VERBAL ORDERS PER DR LOCKWOOD     Transfusion Status OK TO TRANSFUSE    COMPREHENSIVE METABOLIC PANEL     Status: Abnormal   Collection Time    05/15/14  3:45 AM      Result Value Ref Range   Sodium 144  137 - 147 mEq/L   Potassium 4.1  3.7 - 5.3 mEq/L   Chloride 106  96 - 112 mEq/L   CO2 22  19 - 32 mEq/L   Glucose, Bld 114 (*) 70 - 99 mg/dL   BUN 26 (*) 6 - 23 mg/dL   Creatinine, Ser 2.33 (*) 0.50 - 1.35 mg/dL   Calcium 10.2  8.4 - 10.5 mg/dL   Total Protein 7.1  6.0 - 8.3 g/dL   Albumin 3.7  3.5 - 5.2 g/dL   AST 34  0 - 37 U/L   ALT 31  0 - 53 U/L   Alkaline Phosphatase 95  39 - 117 U/L   Total Bilirubin <0.2 (*) 0.3 - 1.2 mg/dL   GFR calc non Af Amer 28 (*) >90 mL/min   GFR calc Af Amer 32 (*) >90 mL/min   Comment: (NOTE)     The eGFR has been calculated using the CKD EPI equation.     This calculation has not been validated in all clinical situations.     eGFR's persistently <90 mL/min signify possible Chronic Kidney     Disease.   Anion gap 16 (*) 5 - 15  CBC     Status: Abnormal   Collection Time    05/15/14  3:45 AM      Result Value Ref Range   WBC 5.5  4.0 - 10.5 K/uL   RBC 3.55 (*) 4.22 - 5.81 MIL/uL   Hemoglobin 10.7 (*) 13.0 - 17.0 g/dL   HCT 35.7 (*) 39.0 - 52.0 %   MCV 100.6 (*) 78.0 - 100.0 fL   MCH 30.1  26.0 - 34.0 pg   MCHC 30.0  30.0 - 36.0 g/dL   RDW 14.2  11.5 - 15.5 %   Platelets 132 (*) 150 - 400 K/uL  ETHANOL  Status: None   Collection Time    05/15/14  3:45 AM      Result Value Ref Range   Alcohol, Ethyl (B) <11  0 - 11 mg/dL   Comment:            LOWEST DETECTABLE LIMIT FOR     SERUM ALCOHOL IS 11 mg/dL     FOR MEDICAL PURPOSES ONLY  PROTIME-INR     Status: None   Collection Time     05/15/14  3:45 AM      Result Value Ref Range   Prothrombin Time 12.7  11.6 - 15.2 seconds   INR 0.95  0.00 - 1.49  URINE RAPID DRUG SCREEN (HOSP PERFORMED)     Status: Abnormal   Collection Time    05/15/14  7:44 AM      Result Value Ref Range   Opiates NONE DETECTED  NONE DETECTED   Cocaine NONE DETECTED  NONE DETECTED   Benzodiazepines POSITIVE (*) NONE DETECTED   Amphetamines NONE DETECTED  NONE DETECTED   Tetrahydrocannabinol POSITIVE (*) NONE DETECTED   Barbiturates NONE DETECTED  NONE DETECTED   Comment:            DRUG SCREEN FOR MEDICAL PURPOSES     ONLY.  IF CONFIRMATION IS NEEDED     FOR ANY PURPOSE, NOTIFY LAB     WITHIN 5 DAYS.                LOWEST DETECTABLE LIMITS     FOR URINE DRUG SCREEN     Drug Class       Cutoff (ng/mL)     Amphetamine      1000     Barbiturate      200     Benzodiazepine   564     Tricyclics       332     Opiates          300     Cocaine          300     THC              50  URINALYSIS, ROUTINE W REFLEX MICROSCOPIC     Status: None   Collection Time    05/15/14  7:44 AM      Result Value Ref Range   Color, Urine YELLOW  YELLOW   APPearance CLEAR  CLEAR   Specific Gravity, Urine 1.008  1.005 - 1.030   pH 6.0  5.0 - 8.0   Glucose, UA NEGATIVE  NEGATIVE mg/dL   Hgb urine dipstick NEGATIVE  NEGATIVE   Bilirubin Urine NEGATIVE  NEGATIVE   Ketones, ur NEGATIVE  NEGATIVE mg/dL   Protein, ur NEGATIVE  NEGATIVE mg/dL   Urobilinogen, UA 0.2  0.0 - 1.0 mg/dL   Nitrite NEGATIVE  NEGATIVE   Leukocytes, UA NEGATIVE  NEGATIVE   Comment: MICROSCOPIC NOT DONE ON URINES WITH NEGATIVE PROTEIN, BLOOD, LEUKOCYTES, NITRITE, OR GLUCOSE <1000 mg/dL.    Outpatient Encounter Prescriptions as of 07/16/2014  Medication Sig  . ALPRAZolam (XANAX) 0.25 MG tablet Take 4 tablets (1 mg total) by mouth 3 (three) times daily.  . clopidogrel (PLAVIX) 75 MG tablet Take 75 mg by mouth every morning.   . lamoTRIgine (LAMICTAL) 100 MG tablet Take 1 tablet (100 mg  total) by mouth 2 (two) times daily.  . metoprolol succinate (TOPROL-XL) 25 MG 24 hr tablet Take 25 mg by mouth every morning.   Marland Kitchen OLANZapine (  ZYPREXA) 20 MG tablet Take 1 tablet (20 mg total) by mouth at bedtime.  . simvastatin (ZOCOR) 10 MG tablet Take 10 mg by mouth daily.  . tamsulosin (FLOMAX) 0.4 MG CAPS Take 0.4 mg by mouth every morning.   . traZODone (DESYREL) 50 MG tablet Take 1 tablet (50 mg total) by mouth at bedtime.  . [DISCONTINUED] ALPRAZolam (XANAX) 0.25 MG tablet Take 4 tablets (1 mg total) by mouth 3 (three) times daily.  . [DISCONTINUED] ALPRAZolam (XANAX) 1 MG tablet Take 1 mg by mouth 3 (three) times daily.  . [DISCONTINUED] clopidogrel (PLAVIX) 75 MG tablet Take 75 mg by mouth daily.  . [DISCONTINUED] FLUoxetine (PROZAC) 10 MG capsule Take 10 mg by mouth daily.  . [DISCONTINUED] haloperidol (HALDOL) 5 MG tablet Take 5 mg by mouth every 6 (six) hours as needed for agitation.   . [DISCONTINUED] lamoTRIgine (LAMICTAL) 100 MG tablet Take 1 tablet (100 mg total) by mouth 2 (two) times daily.  . [DISCONTINUED] lamoTRIgine (LAMICTAL) 100 MG tablet Take 100 mg by mouth daily. Start 50 mg daily for 1 week and than 100 mg daily  . [DISCONTINUED] lamoTRIgine (LAMICTAL) 100 MG tablet Take 100 mg by mouth 2 (two) times daily.   . [DISCONTINUED] metoprolol succinate (TOPROL-XL) 25 MG 24 hr tablet Take 25 mg by mouth daily.  . [DISCONTINUED] olanzapine zydis (ZYPREXA) 15 MG disintegrating tablet Take 15 mg by mouth at bedtime.  . [DISCONTINUED] olanzapine zydis (ZYPREXA) 20 MG disintegrating tablet Take 1 tablet (20 mg total) by mouth at bedtime.  . [DISCONTINUED] risperiDONE (RISPERDAL M-TABS) 0.5 MG disintegrating tablet Take 0.5 mg by mouth at bedtime.  . [DISCONTINUED] simvastatin (ZOCOR) 10 MG tablet Take 10 mg by mouth at bedtime.  . [DISCONTINUED] tamsulosin (FLOMAX) 0.4 MG CAPS capsule Take 0.4 mg by mouth daily after breakfast.  . [DISCONTINUED] traZODone (DESYREL) 100 MG tablet  Take 100 mg by mouth at bedtime.  . [DISCONTINUED] traZODone (DESYREL) 50 MG tablet Take 50 mg by mouth at bedtime.    Past Psychiatric History/Hospitalization(s): Anxiety: Yes Bipolar Disorder: Yes Depression: Yes Mania: Yes Psychosis: Yes Schizophrenia: No Personality Disorder: No Hospitalization for psychiatric illness: Patient was recently admitted because of lithium toxicity on the medical floor and then he was again admitted to [facility] because of worsening of the symptoms.  He had a good response to the lithium however it was discontinued because of high creatinine.  He is seen in this office since 2007.  He has an earlier admission at [place] in 2006 because of mania.  In the past he has taken Abilify, Risperdal, Lexapro, Wellbutrin and recently he was given Navane.  Patient has history of previous suicidal attempt, psychosis, mania and hospitalization.  Physical Exam: Constitutional:  BP 122/71  Pulse 90  Ht 5' 10.5" (1.791 m)  Wt 174 lb (78.926 kg)  BMI 24.61 kg/m2  General Appearance: well nourished and Casually dressed.  He maintained fair eye contact.  Musculoskeletal: Strength & Muscle Tone: within normal limits Gait & Station: He has some difficulty walking because of pain. Patient leans: N/A   Mental Status Examination; Patient is casually dressed.  He maintained fair eye contact.  His speech is fast and at times rambling.  he is superficially cooperative.  His thought processes circumstantial.  His attention and concentration is distracted.  He denies any paranoia, hallucination or any obsessive thoughts.  He denies any auditory or visual hallucination.  His psychomotor activity is slightly increased.  He described his  mood is okay and his affect is labile.  There were no delusions at this time.  He has difficulty organizing his thoughts.  He is alert and oriented x3 however he has difficulty remembering things.  His fund of knowledge is  adequate.  His insight judgment and impulse control is okay.   Established Problem, Stable/Improving (1), Review or order clinical lab tests (1), Review of Last Therapy Session (1) and Review of Medication Regimen & Side Effects (2)  Assessment: Axis I: Bipolar disorder NOS  Axis II: Deferred  Axis III: See medical history  Axis IV: Mild to moderate  Axis V: 60-65   Plan:  Patient is doing better on his current medication.  His wife endorsed that he is close to baseline.  We will switch his Zyprexa from Zydis to regular elanzepine 20 mg at bedtime.  Continue Lamictal 100 mg twice a day, Xanax 0.25 mg 3 times a day and trazodone 50 mg at bedtime.  He does not have any rash or itching.  I encouraged him to keep appointment with nephrologist for kidney function tests.  Recommended to call us back if he has any question or any concern.  Followup in 2 months.  ARFEEN,SYED T., MD 07/16/2014

## 2014-09-14 ENCOUNTER — Other Ambulatory Visit (HOSPITAL_COMMUNITY): Payer: Self-pay | Admitting: Psychiatry

## 2014-09-14 DIAGNOSIS — F319 Bipolar disorder, unspecified: Secondary | ICD-10-CM

## 2014-09-15 ENCOUNTER — Ambulatory Visit (INDEPENDENT_AMBULATORY_CARE_PROVIDER_SITE_OTHER): Payer: Medicare Other | Admitting: Psychiatry

## 2014-09-15 ENCOUNTER — Encounter (HOSPITAL_COMMUNITY): Payer: Self-pay | Admitting: Psychiatry

## 2014-09-15 VITALS — BP 126/73 | HR 83 | Ht 71.0 in | Wt 183.8 lb

## 2014-09-15 DIAGNOSIS — F319 Bipolar disorder, unspecified: Secondary | ICD-10-CM | POA: Diagnosis not present

## 2014-09-15 DIAGNOSIS — Z79899 Other long term (current) drug therapy: Secondary | ICD-10-CM | POA: Diagnosis not present

## 2014-09-15 MED ORDER — LAMOTRIGINE 100 MG PO TABS: 100.0000 mg | ORAL_TABLET | Freq: Two times a day (BID) | ORAL | Status: DC

## 2014-09-15 MED ORDER — ALPRAZOLAM 0.25 MG PO TABS: 0.2500 mg | ORAL_TABLET | Freq: Three times a day (TID) | ORAL | Status: DC

## 2014-09-15 MED ORDER — TRAZODONE HCL 50 MG PO TABS: 50.0000 mg | ORAL_TABLET | Freq: Every day | ORAL | Status: DC

## 2014-09-15 MED ORDER — OLANZAPINE 20 MG PO TABS: 20.0000 mg | ORAL_TABLET | Freq: Every day | ORAL | Status: DC

## 2014-09-15 NOTE — Progress Notes (Signed)
[facility] [phone] Progress Note  BRAINARD REGES [id number] 63 y.o.  09/15/2014 11:11 AM  Chief Complaint:  Medication management and followup.         History of Present Illness: Jeffrey Herring came for his followup appointment with his wife.  He is compliant with his medication and taking Lamictal 100 mg twice a day, Xanax 0.25 mg three a day and olanzapine 20 mg at bedtime.  He sleeping good.  He denies any irritability, anger, mood swings.  His wife endorsed that he is overall much better.  He has no side effects including any rash or itching.  Sometime he has difficulty falling asleep but he denies any paranoia or any hallucination.  His appetite is improved and he has gained weight from the past.  He has no blood work since he left the hospital.  He is taking his cardiac medication but did not scheduled to see his primary care physician or cardiologist.  Patient lives with his wife who is very supportive.  He had a good Thanksgiving because his children were around him.  Patient denies drinking or using any illegal substances.  Suicidal Ideation: No Plan Formed: No Patient has means to carry out plan: No  Homicidal Ideation: No Plan Formed: No Patient has means to carry out plan: No  Review of Systems: Psychiatric: Agitation: No Hallucination: No Depressed Mood: No Insomnia: Yes Hypersomnia: No Altered Concentration: No Feels Worthless: No Grandiose Ideas: No Belief In Special Powers: No New/Increased Substance Abuse: No Compulsions: No  Neurologic: Headache: No Seizure: No Paresthesias: No  Medical History; Patient has history of arrhythmia, history of pulmonary embolism and DVT, history of nephrolithiasis , benign prostate hypertrophy, history of chronic kidney disease , anemia and hyperlipidemia.  He has a pacemaker .  patient was admitted recently because of lithium toxicity. No results found for this or any previous visit (from the past 2160  hour(s)).  Outpatient Encounter Prescriptions as of 09/15/2014  Medication Sig  . clopidogrel (PLAVIX) 75 MG tablet TAKE 1 TABLET BY MOUTH EVERYDAY  . ALPRAZolam (XANAX) 0.25 MG tablet Take 1 tablet (0.25 mg total) by mouth 3 (three) times daily.  . clopidogrel (PLAVIX) 75 MG tablet Take 75 mg by mouth every morning.   . lamoTRIgine (LAMICTAL) 100 MG tablet Take 1 tablet (100 mg total) by mouth 2 (two) times daily.  . metoprolol succinate (TOPROL-XL) 25 MG 24 hr tablet Take 25 mg by mouth every morning.   Marland Kitchen OLANZapine (ZYPREXA) 20 MG tablet Take 1 tablet (20 mg total) by mouth at bedtime.  . simvastatin (ZOCOR) 10 MG tablet Take 10 mg by mouth daily.  . tamsulosin (FLOMAX) 0.4 MG CAPS Take 0.4 mg by mouth every morning.   . traZODone (DESYREL) 50 MG tablet Take 1 tablet (50 mg total) by mouth at bedtime.  . [DISCONTINUED] ALPRAZolam (XANAX) 0.25 MG tablet Take 4 tablets (1 mg total) by mouth 3 (three) times daily.  . [DISCONTINUED] lamoTRIgine (LAMICTAL) 100 MG tablet Take 1 tablet (100 mg total) by mouth 2 (two) times daily.  . [DISCONTINUED] OLANZapine (ZYPREXA) 20 MG tablet Take 1 tablet (20 mg total) by mouth at bedtime.  . [DISCONTINUED] traZODone (DESYREL) 50 MG tablet Take 1 tablet (50 mg total) by mouth at bedtime.    Past Psychiatric History/Hospitalization(s): Anxiety: Yes Bipolar Disorder: Yes Depression: Yes Mania: Yes Psychosis: Yes Schizophrenia: No Personality Disorder: No Hospitalization for psychiatric illness: Patient was recently admitted because of lithium toxicity on the medical floor and  then he was again admitted to [facility] because of worsening of the symptoms.  He had a good response to the lithium however it was discontinued because of high creatinine.  He is seen in this office since 2007.  He has an earlier admission at [facility] in 2006 because of mania.  In the past he has taken Abilify, Risperdal, Lexapro, Wellbutrin and recently he  was given Navane.  Patient has history of previous suicidal attempt, psychosis, mania and hospitalization.  Physical Exam: Constitutional:  BP 126/73 mmHg  Pulse 83  Ht 5\' 11"  (1.803 m)  Wt 183 lb 12.8 oz (83.371 kg)  BMI 25.65 kg/m2  General Appearance: well nourished and Casually dressed.  He maintained fair eye contact.  Musculoskeletal: Strength & Muscle Tone: within normal limits Gait & Station: He has some difficulty walking because of pain. Patient leans: N/A   Mental Status Examination; Patient is casually dressed.  He maintained fair eye contact.  His speech is fast but coherent.  He is cooperative and pleasant.  His thought processes is circumstantial.  His attention and concentration is fair.  He denies any paranoia, hallucination or any obsessive thoughts.  He denies any auditory or visual hallucination.  His psychomotor activity is slightly increased.  He described his mood is okay and his affect is labile.  There were no delusions at this time.  He is alert and oriented x3 however he has difficulty remembering things.  His fund of knowledge is adequate.  His insight judgment and impulse control is okay.   Established Problem, Stable/Improving (1), Review or order clinical lab tests (1), Review of Last Therapy Session (1) and Review of Medication Regimen & Side Effects (2)  Assessment: Axis I: Bipolar disorder NOS  Axis II: Deferred  Axis III: See medical history  Axis IV: Mild to moderate  Axis V: 60-65   Plan:  Patient is doing much better from the past.  He is more calm and organized.  He is taking his medication.  I will continue olanzapine 20 mg at bedtime, Lamictal 100 mg twice a day, Xanax 0.25 mg 3 times a day and trazodone 50 mg at bedtime.  I discussed medication side effects especially olanzapine can cause metabolic syndrome and weight gain.  I will do blood test including CBC, hemoglobin [id number], metabolic panel.  Encouraged to see his primary care physician  and cardiologist for the management of cardiac medication.  Recommended to call us back if he has any question or any concern.  I will see him again in 3 months.  ARFEEN,SYED T., MD 09/15/2014

## 2014-10-26 DIAGNOSIS — Z45018 Encounter for adjustment and management of other part of cardiac pacemaker: Secondary | ICD-10-CM | POA: Diagnosis not present

## 2014-10-26 DIAGNOSIS — I442 Atrioventricular block, complete: Secondary | ICD-10-CM | POA: Diagnosis not present

## 2014-10-26 DIAGNOSIS — I2510 Atherosclerotic heart disease of native coronary artery without angina pectoris: Secondary | ICD-10-CM | POA: Diagnosis not present

## 2014-11-13 ENCOUNTER — Other Ambulatory Visit (HOSPITAL_COMMUNITY): Payer: Self-pay | Admitting: Psychiatry

## 2014-11-13 NOTE — Telephone Encounter (Signed)
Pt's wife called stating refills are needed for Zyprexa and Lamotrigine. Has no pills left of Zyprexa because patient was confused and had taken a couple extra doses this past month. Requesting a 90 day supply. Dr. Adele Schilder approved 90 day refill.

## 2014-12-15 ENCOUNTER — Ambulatory Visit (INDEPENDENT_AMBULATORY_CARE_PROVIDER_SITE_OTHER): Payer: Medicare Other | Admitting: Psychiatry

## 2014-12-15 ENCOUNTER — Encounter (HOSPITAL_COMMUNITY): Payer: Self-pay | Admitting: Psychiatry

## 2014-12-15 ENCOUNTER — Ambulatory Visit (HOSPITAL_COMMUNITY): Payer: Self-pay | Admitting: Psychiatry

## 2014-12-15 VITALS — BP 121/63 | HR 76 | Ht 71.0 in | Wt 190.8 lb

## 2014-12-15 DIAGNOSIS — F319 Bipolar disorder, unspecified: Secondary | ICD-10-CM | POA: Diagnosis not present

## 2014-12-15 MED ORDER — LAMOTRIGINE 100 MG PO TABS: ORAL_TABLET | ORAL | Status: DC

## 2014-12-15 MED ORDER — ALPRAZOLAM 0.5 MG PO TABS: 0.5000 mg | ORAL_TABLET | Freq: Three times a day (TID) | ORAL | Status: DC

## 2014-12-15 MED ORDER — TRAZODONE HCL 50 MG PO TABS: 50.0000 mg | ORAL_TABLET | Freq: Every day | ORAL | Status: DC

## 2014-12-15 MED ORDER — OLANZAPINE 20 MG PO TABS: ORAL_TABLET | ORAL | Status: DC

## 2014-12-15 NOTE — Progress Notes (Signed)
[facility] [phone] Progress Note  Jeffrey Herring [id number] 64 y.o.  12/15/2014 11:48 AM  Chief Complaint:  I am feeling more anxious and nervous.  I'm using more Xanax.           History of Present Illness: Jeffrey Herring came for his followup appointment with his wife.  His wife endorsed that he is been more anxious nervous and using more frequently Xanax.  He used to take 1 mg 3 times a day however on his last admission it was discontinued and he was only given 0.25 mg 3 times a day.  Jeffrey Herring told sometime he is very nervous anxious and admitted irritability and depressed.  He wants to try a higher dose of Xanax which he used to take with good response.  Overall he is compliant with Lamictal, olanzapine and denies any side effects.  He has no rash or itching.  He believes his thinking is clear and there has been no recent paranoia or any hallucination.  His appetite is okay.  His energy level is good.  Jeffrey Herring lives with his wife who is very supportive.  He denies any crying spells or any feeling of hopelessness or worthlessness.  Jeffrey Herring denies drinking or using any illegal substances.  Jeffrey Herring forgot his blood work but promised to do with very swollen.    Suicidal Ideation: No Plan Formed: No Jeffrey Herring has means to carry out plan: No  Homicidal Ideation: No Plan Formed: No Jeffrey Herring has means to carry out plan: No  Review of Systems  Musculoskeletal: Positive for back pain.       Complaining of Sciatica   Skin: Negative for itching and rash.  Psychiatric/Behavioral: Negative for suicidal ideas. The Jeffrey Herring is nervous/anxious and has insomnia.     Psychiatric: Agitation: No Hallucination: No Depressed Mood: No Insomnia: Yes Hypersomnia: No Altered Concentration: No Feels Worthless: No Grandiose Ideas: No Belief In Special Powers: No New/Increased Substance Abuse: No Compulsions: No  Neurologic: Headache: No Seizure: No Paresthesias: No  Medical History; Jeffrey Herring has  history of arrhythmia, history of pulmonary embolism and DVT, history of nephrolithiasis , benign prostate hypertrophy, history of chronic kidney disease , anemia and hyperlipidemia.  He has a pacemaker .  He has history of lithium toxicity.    No results found for this or any previous visit (from the past 2160 hour(s)).  Outpatient Encounter Prescriptions as of 12/15/2014  Medication Sig  . ALPRAZolam (XANAX) 0.5 MG tablet Take 1 tablet (0.5 mg total) by mouth 3 (three) times daily.  . clopidogrel (PLAVIX) 75 MG tablet Take 75 mg by mouth every morning.   . lamoTRIgine (LAMICTAL) 100 MG tablet TAKE 1 TABLET BY MOUTH 2 TIMES DAILY.  . metoprolol succinate (TOPROL-XL) 25 MG 24 hr tablet Take 25 mg by mouth every morning.   Marland Kitchen OLANZapine (ZYPREXA) 20 MG tablet TAKE 1 TABLET BY MOUTH ATBEDTIME.  . simvastatin (ZOCOR) 10 MG tablet Take 10 mg by mouth daily.  . tamsulosin (FLOMAX) 0.4 MG CAPS Take 0.4 mg by mouth every morning.   . traZODone (DESYREL) 50 MG tablet Take 1 tablet (50 mg total) by mouth at bedtime.  . [DISCONTINUED] ALPRAZolam (XANAX) 0.25 MG tablet Take 1 tablet (0.25 mg total) by mouth 3 (three) times daily.  . [DISCONTINUED] clopidogrel (PLAVIX) 75 MG tablet TAKE 1 TABLET BY MOUTH EVERYDAY  . [DISCONTINUED] lamoTRIgine (LAMICTAL) 100 MG tablet TAKE 1 TABLET BY MOUTH 2 TIMES DAILY.  . [DISCONTINUED] OLANZapine (ZYPREXA) 20 MG tablet TAKE 1 TABLET  BY MOUTH ATBEDTIME.  . [DISCONTINUED] traZODone (DESYREL) 50 MG tablet Take 1 tablet (50 mg total) by mouth at bedtime.    Past Psychiatric History/Hospitalization(s): Anxiety: Yes Bipolar Disorder: Yes Depression: Yes Mania: Yes Psychosis: Yes Schizophrenia: No Personality Disorder: No Hospitalization for psychiatric illness: Jeffrey Herring was recently admitted because of lithium toxicity on the medical floor and then he was again admitted to [facility] because of worsening of the symptoms.  He had a good response to the lithium  however it was discontinued because of high creatinine.  He is seen in this office since 2007.  He has an earlier admission at [facility] in 2006 because of mania.  In the past he has taken Abilify, Risperdal, Lexapro, Wellbutrin and recently he was given Navane.  Jeffrey Herring has history of previous suicidal attempt, psychosis, mania and hospitalization.  Physical Exam: Constitutional:  BP 121/63 mmHg  Pulse 76  Ht 5\' 11"  (1.803 m)  Wt 190 lb 12.8 oz (86.546 kg)  BMI 26.62 kg/m2  General Appearance: well nourished and Casually dressed.  He maintained fair eye contact.  Musculoskeletal: Strength & Muscle Tone: within normal limits Gait & Station: He has some difficulty walking because of pain. Jeffrey Herring leans: N/A   Mental Status Examination; Jeffrey Herring is casually dressed.  He maintained fair eye contact.  His speech is fast but coherent.  He is cooperative and pleasant.  His thought processes is circumstantial.  His attention and concentration is fair.  He denies any paranoia, hallucination or any obsessive thoughts.  He denies any auditory or visual hallucination.  His psychomotor activity is normal.  He described his mood is okay and his affect is labile.  There were no delusions at this time.  He is alert and oriented x3 however he has difficulty remembering things.  His fund of knowledge is adequate.  His insight judgment and impulse control is okay.   Established Problem, Stable/Improving (1), Review of Psycho-Social Stressors (1), Review or order clinical lab tests (1), Established Problem, Worsening (2), Review of Last Therapy Session (1), Review of Medication Regimen & Side Effects (2) and Review of New Medication or Change in Dosage (2)  Assessment: Axis I: Bipolar disorder NOS  Axis II: Deferred  Axis III: See medical history  Plan:  Jeffrey Herring continues to have anxiety and nervousness and he is taking more Xanax than prescribed.  In the past he used to take Xanax 1 mg 3  times a day and currently he is taking 0.25 mg 3 times a day.  We will increase Xanax 0.5 mg 3 times a day.  Discussed benzodiazepine dependence, tolerance abuse and withdrawal symptoms.  His wife also wants to complete disability papers.  Jeffrey Herring is been on disabled for 10 years.  At this time I will continue Lamictal 100 mg twice a day trazodone 50 mg at bedtime and olanzapine 20 mg at bedtime.  He does not have any side effects or any shakes or tremors.  I encouraged to have his blood work as soon as possible.  Follow-up in 3 months.  Time spent 25 minutes.  Recommended to call us back if he has any question, concern or if he feel worsening of the symptom.  More than 50% of the time spent in psychoeducation, counseling and coordination of care.  Karver Fadden T., MD 12/15/2014

## 2014-12-24 ENCOUNTER — Ambulatory Visit (HOSPITAL_COMMUNITY): Payer: Self-pay | Admitting: Psychiatry

## 2015-01-15 DIAGNOSIS — F319 Bipolar disorder, unspecified: Secondary | ICD-10-CM | POA: Diagnosis not present

## 2015-01-15 DIAGNOSIS — Z79899 Other long term (current) drug therapy: Secondary | ICD-10-CM | POA: Diagnosis not present

## 2015-01-15 DIAGNOSIS — E559 Vitamin D deficiency, unspecified: Secondary | ICD-10-CM | POA: Diagnosis not present

## 2015-01-26 ENCOUNTER — Other Ambulatory Visit (HOSPITAL_COMMUNITY): Payer: Self-pay | Admitting: Psychiatry

## 2015-01-26 NOTE — Telephone Encounter (Signed)
Laroy Apple, pharmacy technician at [facility], to verify they had received and filled patient's requested orders of Lamictal and Zyprexa, 90 day supplies from 12/15/14 order that was e-scribed.  Informed it was too early to authorize more refills and patient is to return to for next evaluation on 03/17/15.

## 2015-02-05 DIAGNOSIS — Z45018 Encounter for adjustment and management of other part of cardiac pacemaker: Secondary | ICD-10-CM | POA: Diagnosis not present

## 2015-02-05 DIAGNOSIS — I442 Atrioventricular block, complete: Secondary | ICD-10-CM | POA: Diagnosis not present

## 2015-02-06 NOTE — Op Note (Signed)
PATIENT NAME:  Jeffrey Herring, Jeffrey Herring MR#:  [id number] DATE OF BIRTH:  May 07, 1951  DATE OF PROCEDURE:  02/05/2014  PREOPERATIVE DIAGNOSIS: Symptomatic right inguinal hernia.   POSTOPERATIVE DIAGNOSIS: Symptomatic right inguinal hernia.   OPERATIVE PROCEDURE: Right inguinal hernia repair with UltraPro mesh.   SURGEON: Hervey Ard, MD.   ANESTHESIA: General by LMA under Dr. Ronelle Nigh, Marcaine 0.5% with 1:200,000 units epinephrine, 30 mL local infiltration.   ESTIMATED BLOOD LOSS: Less than 5 mL.   CLINICAL NOTE: This 64 year old male has developed a symptomatic right inguinal hernia. He was admitted for elective repair. He received Kefzol prior to the procedure. Hair was removed from the surgical field with clippers prior to presentation to the operating theater.   OPERATIVE NOTE: With the patient under adequate general anesthesia, the area was prepped with ChloraPrep and draped. A 5 cm skin line incision along the course of the inguinal canal was carried down through the skin and subcutaneous tissue with hemostasis achieved with electrocautery and 3-0 Vicryl ties. The external oblique was opened in the direction of its fibers. The cord was mobilized and a medium sized indirect sac as well as a lipoma of the cord was appreciated. The lipoma and the cord was excised and discarded. The sac was dissected back into the preperitoneal space. The ilioinguinal and iliohypogastric nerves were identified and protected. A medium UltraPro mesh was smoothed into the preperitoneal space and the external component along the floor of the inguinal canal. This was anchored to the pubic tubercle and then along the inguinal ligament with interrupted 0 Surgilon sutures. The superior and medial borders were affixed the transverse abdominis aponeurosis. A lateral slit was made for cord passage, which was closed with similar sutures. The external oblique was closed with a running 2-0 Vicryl, Scarpa's fascia was closed with a  running 3-0 Vicryl and the skin closed with running 4-0 Vicryl subcuticular suture. Benzoin, Steri-Strips, Telfa and Tegaderm dressings were then applied. The patient tolerated the procedure well and was taken to the recovery room in stable condition.   ____________________________ Robert Bellow, MD jwb:aw D: 02/05/2014 12:55:14 ET T: 02/05/2014 13:18:24 ET JOB#: [id number]  cc: Robert Bellow, MD, <Dictator> Ginette A. Oran Rein, MD JEFFREY Amedeo Kinsman MD ELECTRONICALLY SIGNED 02/06/2014 17:00

## 2015-03-12 DIAGNOSIS — K4090 Unilateral inguinal hernia, without obstruction or gangrene, not specified as recurrent: Secondary | ICD-10-CM

## 2015-03-17 ENCOUNTER — Ambulatory Visit (INDEPENDENT_AMBULATORY_CARE_PROVIDER_SITE_OTHER): Payer: Medicare Other | Admitting: Psychiatry

## 2015-03-17 ENCOUNTER — Encounter (HOSPITAL_COMMUNITY): Payer: Self-pay | Admitting: Psychiatry

## 2015-03-17 VITALS — BP 110/74 | HR 80 | Ht 71.0 in | Wt 185.0 lb

## 2015-03-17 DIAGNOSIS — F319 Bipolar disorder, unspecified: Secondary | ICD-10-CM | POA: Diagnosis not present

## 2015-03-17 MED ORDER — LAMOTRIGINE 200 MG PO TABS: 200.0000 mg | ORAL_TABLET | Freq: Every day | ORAL | Status: DC

## 2015-03-17 MED ORDER — OLANZAPINE 20 MG PO TABS: ORAL_TABLET | ORAL | Status: DC

## 2015-03-17 MED ORDER — TRAZODONE HCL 50 MG PO TABS: 50.0000 mg | ORAL_TABLET | Freq: Every day | ORAL | Status: DC

## 2015-03-17 MED ORDER — ALPRAZOLAM 0.5 MG PO TABS: 0.5000 mg | ORAL_TABLET | Freq: Three times a day (TID) | ORAL | Status: DC

## 2015-03-17 NOTE — Progress Notes (Addendum)
[facility] [phone] Progress Note  LELAND POUCH [id number] 64 y.o.  03/17/2015 11:15 AM  Chief Complaint:  Medication management and follow-up.            History of Present Illness: Jeffrey Herring came for his followup appointment with his wife.   He is feeling much better since Xanax increased to 0.5 mg 3 times a day.  He used to take 1 mg 3 times a day however dose was reduced when he admitted to rehabilitation facility.  He is doing much better physically and he is also sleeping better.  However his wife noticed that he's more quite and requires excessive encouragement to do outdoor activities.  He used to enjoy beach but lately he is been not interested in getting his house.  Patient denies any depression but admitted some time feeling easily tired.  He denies any crying spells, irritability or any mood swing.  He is taking his medication and reported no side effects.  He has no rash or itching.  His wife insists to go to [place] to celebrate his granddaughter's fourth birthday.  Overall his sleep is good.  His appetite is okay.  His vitals are stable.  Patient denies drinking or using any illegal substances.  He wants to continue his current psycho topic medication.  Suicidal Ideation: No Plan Formed: No Patient has means to carry out plan: No  Homicidal Ideation: No Plan Formed: No Patient has means to carry out plan: No  Review of Systems  Musculoskeletal: Positive for back pain.  Skin: Negative for itching and rash.  Neurological: Negative for dizziness and tremors.  Psychiatric/Behavioral: Negative.     Psychiatric: Agitation: No Hallucination: No Depressed Mood: No Insomnia: Yes Hypersomnia: No Altered Concentration: No Feels Worthless: No Grandiose Ideas: No Belief In Special Powers: No New/Increased Substance Abuse: No Compulsions: No  Neurologic: Headache: No Seizure: No Paresthesias: No  Medical History; Patient has history of arrhythmia, history of  pulmonary embolism and DVT, history of nephrolithiasis , benign prostate hypertrophy, history of chronic kidney disease , anemia and hyperlipidemia.  He has a pacemaker .  He has history of lithium toxicity.    No results found for this or any previous visit (from the past 2160 hour(s)).  Outpatient Encounter Prescriptions as of 03/17/2015  Medication Sig  . ALPRAZolam (XANAX) 0.5 MG tablet Take 1 tablet (0.5 mg total) by mouth 3 (three) times daily.  . clopidogrel (PLAVIX) 75 MG tablet Take 75 mg by mouth every morning.   . lamoTRIgine (LAMICTAL) 200 MG tablet Take 1 tablet (200 mg total) by mouth daily.  . metoprolol succinate (TOPROL-XL) 25 MG 24 hr tablet Take 25 mg by mouth every morning.   Marland Kitchen OLANZapine (ZYPREXA) 20 MG tablet TAKE 1 TABLET BY MOUTH ATBEDTIME.  . simvastatin (ZOCOR) 10 MG tablet Take 10 mg by mouth daily.  . tamsulosin (FLOMAX) 0.4 MG CAPS Take 0.4 mg by mouth every morning.   . traZODone (DESYREL) 50 MG tablet Take 1 tablet (50 mg total) by mouth at bedtime.  . [DISCONTINUED] ALPRAZolam (XANAX) 0.5 MG tablet Take 1 tablet (0.5 mg total) by mouth 3 (three) times daily.  . [DISCONTINUED] lamoTRIgine (LAMICTAL) 100 MG tablet TAKE 1 TABLET BY MOUTH 2 TIMES DAILY.  . [DISCONTINUED] OLANZapine (ZYPREXA) 20 MG tablet TAKE 1 TABLET BY MOUTH ATBEDTIME.  . [DISCONTINUED] traZODone (DESYREL) 50 MG tablet Take 1 tablet (50 mg total) by mouth at bedtime.   No facility-administered encounter medications on file as of  03/17/2015.    Past Psychiatric History/Hospitalization(s) P:atient has history of bipolar disorder and he has one hospitalization at behavioral [facility] in 2062 mania.  He was also admitted due to lithium toxicity on medical floor and then transferred to [facility] because of worsening of the symptoms.  In the past he has taken Abilify, Risperdal, Lexapro, Wellbutrin and Navane. Patient has history of previous suicidal attempt, psychosis, mania and  hospitalization. Anxiety: Yes Bipolar Disorder: Yes Depression: Yes Mania: Yes Psychosis: Yes Schizophrenia: No Personality Disorder: No Hospitalization for psychiatric illness:Yes  Physical Exam: Constitutional:  BP 110/74 mmHg  Pulse 80  Ht 5\' 11"  (1.803 m)  Wt 185 lb (83.915 kg)  BMI 25.81 kg/m2  General Appearance: well nourished and Casually dressed.  He maintained fair eye contact.  Musculoskeletal: Strength & Muscle Tone: within normal limits Gait & Station: He has some difficulty walking because of pain. Patient leans: N/A   Mental Status Examination; Patient is casually dressed.  He maintained fair eye contact.  His speech is  slow but clear and coherent.  He is cooperative and pleasant.  His thought processes is circumstantial.  His attention and concentration is fair.  He denies any paranoia, hallucination or any obsessive thoughts.  He denies any auditory or visual hallucination.  His psychomotor activity is normal.  He described his mood is okay and his affect is labile.  There were no delusions at this time.  He is alert and oriented x3 however he has difficulty remembering things.  His fund of knowledge is adequate.  His insight judgment and impulse control is okay.   Established Problem, Stable/Improving (1), Review of Last Therapy Session (1) and Review of Medication Regimen & Side Effects (2)  Assessment: Axis I: Bipolar disorder NOS  Axis II: Deferred  Axis III: See medical history  Plan:  Patient is doing better on his medication.  He likes and X.5 milligram 3 times a day.  He does not ask for early refills.  He is not drinking or using any illegal substances.  I will continue trazodone 50 mg at bedtime, Lamictal 200 mg daily (patient prefers single-dose 200 mg), and olanzapine 20 mg at bedtime.  He does not have any side effects or any shakes or tremors.   Recommended to call us back if he has any question or any concern.  Follow-up in 3  months.  Shakeyla Giebler T., MD 03/17/2015   Addendum Be to see blood work results from [employer]. his blood was drawn on April 4.  His creatinine is 2 and his BUN is 30.  His CBC is normal except for platelet 147.  His hemoglobin A1c is 5.9.

## 2015-04-07 ENCOUNTER — Encounter: Payer: Self-pay | Admitting: Psychiatry

## 2015-05-12 DIAGNOSIS — I442 Atrioventricular block, complete: Secondary | ICD-10-CM | POA: Diagnosis not present

## 2015-05-12 DIAGNOSIS — Z45018 Encounter for adjustment and management of other part of cardiac pacemaker: Secondary | ICD-10-CM | POA: Diagnosis not present

## 2015-05-26 ENCOUNTER — Other Ambulatory Visit: Payer: Self-pay

## 2015-05-26 NOTE — Patient Outreach (Signed)
[facility]) Care Management  05/26/2015  JEDIAH GALLIS Jun 09, 1951 [id number]   Referral from [facility], assigned Tomasa Rand, RN to outreach.  Thanks, Ronnell Freshwater. [place], [place] Assistant Phone: [phone] Fax: [phone]

## 2015-05-26 NOTE — Patient Outreach (Signed)
High Risk List screening: Placed call to patient. Patient answered and reports that it is not a good time to talk and to call back later in the day. Plan:  Will attempt again.  Tomasa Rand, RN, BSN, CEN [facility] [employer] [phone]

## 2015-05-28 ENCOUNTER — Other Ambulatory Visit: Payer: Self-pay

## 2015-05-28 NOTE — Patient Outreach (Signed)
High Risk Screening Outreach:  Placed call to patient who identified himself. Explained purpose of call. Patient reports that he is doing well and does not have any needs.   Patient has refused services.   Plan: Will send in basket to [place] to close case. Tomasa Rand, RN, BSN, CEN [facility] [employer] [phone]

## 2015-05-28 NOTE — Patient Outreach (Signed)
Unable to verify Primary MD. Unable to locate this information in chart.  Therefore unable to notify MD of patients refusal of [facility] services.  Tomasa Rand, RN, BSN, CEN [facility] [employer] [phone]

## 2015-06-04 NOTE — Patient Outreach (Signed)
Jeffrey Herring, Jeffrey Herring) Care Management  06/04/2015  Jeffrey Herring 1951/09/30 [id number]   Notification from Tomasa Rand, RN to close case due to patient refused [facility] services.  Thanks, Ronnell Freshwater. [place], [place] Assistant Phone: [phone] Fax: [phone]

## 2015-06-17 ENCOUNTER — Encounter (HOSPITAL_COMMUNITY): Payer: Self-pay | Admitting: Psychiatry

## 2015-06-17 ENCOUNTER — Ambulatory Visit (INDEPENDENT_AMBULATORY_CARE_PROVIDER_SITE_OTHER): Payer: Medicare Other | Admitting: Psychiatry

## 2015-06-17 VITALS — BP 132/76 | HR 71 | Ht 69.0 in | Wt 188.6 lb

## 2015-06-17 DIAGNOSIS — F319 Bipolar disorder, unspecified: Secondary | ICD-10-CM

## 2015-06-17 DIAGNOSIS — Z79899 Other long term (current) drug therapy: Secondary | ICD-10-CM | POA: Diagnosis not present

## 2015-06-17 MED ORDER — LAMOTRIGINE 200 MG PO TABS: 200.0000 mg | ORAL_TABLET | Freq: Every day | ORAL | Status: DC

## 2015-06-17 MED ORDER — ALPRAZOLAM 0.5 MG PO TABS: 0.5000 mg | ORAL_TABLET | Freq: Three times a day (TID) | ORAL | Status: DC

## 2015-06-17 MED ORDER — TRAZODONE HCL 50 MG PO TABS: 50.0000 mg | ORAL_TABLET | Freq: Every day | ORAL | Status: DC

## 2015-06-17 MED ORDER — OLANZAPINE 20 MG PO TABS: ORAL_TABLET | ORAL | Status: DC

## 2015-06-17 MED ORDER — ALPRAZOLAM 0.5 MG PO TABS
0.5000 mg | ORAL_TABLET | Freq: Three times a day (TID) | ORAL | Status: DC
Start: 2015-06-17 — End: 2015-06-17

## 2015-06-17 NOTE — Progress Notes (Signed)
[place] Progress Note  JESUSANGEL DICOCHEA [id number] 64 y.o.  06/17/2015 1:42 PM  Chief Complaint:  Medication management and follow-up.            History of Present Illness: Jeffrey Herring came for his followup appointment with his wife.   He was disappointed because he could not go to the beach because of severe heart rather.  But he is happy that his current medicine is working.  He sleeping good.  He denies any irritability, mania or any hallucination.  He sleeping good.  He has no rash or itching with the Lamictal.  He is more social and active.  He has no tremors or shakes.  He wants to continue his current medication.  He denies any paranoia or any hallucination.  His appetite is okay.  His vitals are stable.    Suicidal Ideation: No Plan Formed: No Patient has means to carry out plan: No  Homicidal Ideation: No Plan Formed: No Patient has means to carry out plan: No  Review of Systems  Musculoskeletal: Positive for back pain.  Skin: Negative for itching and rash.  Neurological: Negative for dizziness and tremors.  Psychiatric/Behavioral: Negative.     Psychiatric: Agitation: No Hallucination: No Depressed Mood: No Insomnia: No Hypersomnia: No Altered Concentration: No Feels Worthless: No Grandiose Ideas: No Belief In Special Powers: No New/Increased Substance Abuse: No Compulsions: No  Neurologic: Headache: No Seizure: No Paresthesias: No  Medical History; Patient has history of arrhythmia, pulmonary embolism and DVT, nephrolithiasis , benign prostate hypertrophy, history of chronic kidney disease , anemia and hyperlipidemia.  He has a pacemaker .  He has history of lithium toxicity.  His primary care physician is Dr. Corky Sox  No results found for this or any previous visit (from the past 2160 hour(s)).  Outpatient Encounter Prescriptions as of 06/17/2015  Medication Sig  . ALPRAZolam (XANAX) 0.5 MG tablet Take 1 tablet (0.5 mg total) by mouth 3  (three) times daily.  . clopidogrel (PLAVIX) 75 MG tablet Take 75 mg by mouth every morning.   . lamoTRIgine (LAMICTAL) 200 MG tablet Take 1 tablet (200 mg total) by mouth daily.  . metoprolol succinate (TOPROL-XL) 25 MG 24 hr tablet Take 25 mg by mouth every morning.   Marland Kitchen OLANZapine (ZYPREXA) 20 MG tablet TAKE 1 TABLET BY MOUTH ATBEDTIME.  . simvastatin (ZOCOR) 10 MG tablet Take 10 mg by mouth daily.  . tamsulosin (FLOMAX) 0.4 MG CAPS Take 0.4 mg by mouth every morning.   . traZODone (DESYREL) 50 MG tablet Take 1 tablet (50 mg total) by mouth at bedtime.  . [DISCONTINUED] ALPRAZolam (XANAX) 0.5 MG tablet Take 1 tablet (0.5 mg total) by mouth 3 (three) times daily.  . [DISCONTINUED] ALPRAZolam (XANAX) 0.5 MG tablet Take 1 tablet (0.5 mg total) by mouth 3 (three) times daily.  . [DISCONTINUED] lamoTRIgine (LAMICTAL) 200 MG tablet Take 1 tablet (200 mg total) by mouth daily.  . [DISCONTINUED] OLANZapine (ZYPREXA) 20 MG tablet TAKE 1 TABLET BY MOUTH ATBEDTIME.  . [DISCONTINUED] traZODone (DESYREL) 50 MG tablet Take 1 tablet (50 mg total) by mouth at bedtime.   No facility-administered encounter medications on file as of 06/17/2015.    Past Psychiatric History/Hospitalization(s) P:atient has bipolar disorder.  He had one psychiatric admission in the past due to mania at behavioral [place].  In 2015 he was admitted on the medical floor because of lithium toxicity  and then transferred to [facility] because of worsening of the symptoms.  In the past he has taken Abilify, Risperdal, Lexapro, Wellbutrin and Navane. Patient has history of previous suicidal attempt, psychosis, mania and hospitalization. Anxiety: Yes Bipolar Disorder: Yes Depression: Yes Mania: Yes Psychosis: Yes Schizophrenia: No Personality Disorder: No Hospitalization for psychiatric illness:Yes  Physical Exam: Constitutional:  BP 132/76 mmHg  Pulse 71  Ht 5\' 9"  (1.753 m)  Wt 188 lb 9.6 oz (85.548 kg)  BMI  27.84 kg/m2  General Appearance: well nourished and Casually dressed.  He maintained fair eye contact.  Musculoskeletal: Strength & Muscle Tone: within normal limits Gait & Station: normal Patient leans: N/A   Mental Status Examination; Patient is casually dressed.  He maintained fair eye contact.  His speech is  slow but clear and coherent.  He is cooperative and pleasant.  His thought process slow but logical.  His attention and concentration is fair.  He denies any paranoia, hallucination or any obsessive thoughts.  He denies any auditory or visual hallucination.  His psychomotor activity is normal.  He described his mood is okay and his affect is labile.  There were no delusions at this time.  He is alert and oriented x3 however he has difficulty remembering things.  His fund of knowledge is adequate.  His insight judgment and impulse control is okay.   Established Problem, Stable/Improving (1), Review or order clinical lab tests (1), Review of Last Therapy Session (1) and Review of Medication Regimen & Side Effects (2)  Assessment: Axis I: Bipolar disorder NOS  Axis II: Deferred  Axis III: See medical history  Plan:  Patient is doing better on his medication.  I will continue Xanax 0.5 mg 3 times a day , Lamictal 200 mg daily, olanzapine 20 mg at bedtime and trazodone 50 mg at bedtime.  He has no EPS, shakes, tremors.  Discussed medication side effects and benefits.  I would also do blood work including hemoglobin A1c, comprehensive metabolic panel and CBC.  Patient has history of renal insufficiency.  Recommended to call us back if he has any question or any concern.  Follow-up in 3 months.   Kelita Wallis T., MD 06/17/2015

## 2015-07-27 ENCOUNTER — Other Ambulatory Visit: Payer: Self-pay | Admitting: Emergency Medicine

## 2015-07-27 MED ORDER — METOPROLOL SUCCINATE ER 25 MG PO TB24: 25.0000 mg | ORAL_TABLET | Freq: Every morning | ORAL | Status: DC

## 2015-08-02 ENCOUNTER — Other Ambulatory Visit: Payer: Self-pay | Admitting: Family Medicine

## 2015-08-02 DIAGNOSIS — Z79899 Other long term (current) drug therapy: Secondary | ICD-10-CM | POA: Diagnosis not present

## 2015-08-03 LAB — CBC WITH DIFFERENTIAL/PLATELET
Basophils Absolute: 0 10*3/uL (ref 0.0–0.2)
Basos: 0 %
EOS (ABSOLUTE): 0.1 10*3/uL (ref 0.0–0.4)
Eos: 2 %
Hematocrit: 44.6 % (ref 37.5–51.0)
Hemoglobin: 15.3 g/dL (ref 12.6–17.7)
Immature Grans (Abs): 0.1 10*3/uL (ref 0.0–0.1)
Immature Granulocytes: 1 %
Lymphocytes Absolute: 1 10*3/uL (ref 0.7–3.1)
Lymphs: 11 %
MCH: 31 pg (ref 26.6–33.0)
MCHC: 34.3 g/dL (ref 31.5–35.7)
MCV: 90 fL (ref 79–97)
Monocytes Absolute: 1 10*3/uL — ABNORMAL HIGH (ref 0.1–0.9)
Monocytes: 12 %
Neutrophils Absolute: 6.1 10*3/uL (ref 1.4–7.0)
Neutrophils: 74 %
Platelets: 150 10*3/uL (ref 150–379)
RBC: 4.94 x10E6/uL (ref 4.14–5.80)
RDW: 14.3 % (ref 12.3–15.4)
WBC: 8.3 10*3/uL (ref 3.4–10.8)

## 2015-08-03 LAB — COMPREHENSIVE METABOLIC PANEL
ALT: 13 IU/L (ref 0–44)
AST: 15 IU/L (ref 0–40)
Albumin/Globulin Ratio: 1.9 (ref 1.1–2.5)
Albumin: 4.6 g/dL (ref 3.6–4.8)
Alkaline Phosphatase: 90 IU/L (ref 39–117)
BUN/Creatinine Ratio: 10 (ref 10–22)
BUN: 24 mg/dL (ref 8–27)
Bilirubin Total: 0.3 mg/dL (ref 0.0–1.2)
CO2: 20 mmol/L (ref 18–29)
Calcium: 10.2 mg/dL (ref 8.6–10.2)
Chloride: 104 mmol/L (ref 97–106)
Creatinine, Ser: 2.32 mg/dL — ABNORMAL HIGH (ref 0.76–1.27)
GFR calc Af Amer: 33 mL/min/{1.73_m2} — ABNORMAL LOW (ref >59)
GFR calc non Af Amer: 28 mL/min/{1.73_m2} — ABNORMAL LOW (ref >59)
Globulin, Total: 2.4 g/dL (ref 1.5–4.5)
Glucose: 84 mg/dL (ref 65–99)
Potassium: 4.9 mmol/L (ref 3.5–5.2)
Sodium: 140 mmol/L (ref 136–144)
Total Protein: 7 g/dL (ref 6.0–8.5)

## 2015-08-03 LAB — HEMOGLOBIN A1C
Est. average glucose Bld gHb Est-mCnc: 114 mg/dL
Hgb A1c MFr Bld: 5.6 % (ref 4.8–5.6)

## 2015-08-20 DIAGNOSIS — I442 Atrioventricular block, complete: Secondary | ICD-10-CM | POA: Diagnosis not present

## 2015-08-20 DIAGNOSIS — Z45018 Encounter for adjustment and management of other part of cardiac pacemaker: Secondary | ICD-10-CM | POA: Diagnosis not present

## 2015-08-27 ENCOUNTER — Telehealth: Payer: Self-pay | Admitting: Family Medicine

## 2015-08-27 MED ORDER — METOPROLOL SUCCINATE ER 25 MG PO TB24: 25.0000 mg | ORAL_TABLET | Freq: Every morning | ORAL | Status: DC

## 2015-08-27 NOTE — Telephone Encounter (Signed)
Pt needs refill Metoprolol to be sent to [facility].

## 2015-08-27 NOTE — Telephone Encounter (Signed)
Pt.notified

## 2015-08-27 NOTE — Telephone Encounter (Signed)
Medication has been sent to [facility]

## 2015-09-16 ENCOUNTER — Encounter (HOSPITAL_COMMUNITY): Payer: Self-pay | Admitting: Psychiatry

## 2015-09-16 ENCOUNTER — Ambulatory Visit (INDEPENDENT_AMBULATORY_CARE_PROVIDER_SITE_OTHER): Payer: Medicare Other | Admitting: Psychiatry

## 2015-09-16 VITALS — BP 140/88 | HR 71 | Ht 69.0 in | Wt 188.4 lb

## 2015-09-16 DIAGNOSIS — F319 Bipolar disorder, unspecified: Secondary | ICD-10-CM

## 2015-09-16 MED ORDER — LAMOTRIGINE 200 MG PO TABS: 200.0000 mg | ORAL_TABLET | Freq: Every day | ORAL | Status: DC

## 2015-09-16 MED ORDER — TRAZODONE HCL 50 MG PO TABS: 50.0000 mg | ORAL_TABLET | Freq: Every day | ORAL | Status: DC

## 2015-09-16 MED ORDER — ALPRAZOLAM 0.5 MG PO TABS: 0.5000 mg | ORAL_TABLET | Freq: Three times a day (TID) | ORAL | Status: DC

## 2015-09-16 MED ORDER — OLANZAPINE 20 MG PO TABS: ORAL_TABLET | ORAL | Status: DC

## 2015-09-16 NOTE — Progress Notes (Signed)
[place] Progress Note  Jeffrey Herring [id number] 64 y.o.  09/16/2015 3:14 PM  Chief Complaint:  Medication management and follow-up.            History of Present Illness: Jeffrey Herring came for his followup appointment with his wife.   As per his wife he is taking his medication as prescribed and he is doing much better.  He denies any irritability, anger, mania or any psychosis.  He sleeping good.  He has no rash or itching with the Lamictal.  He denies any feeling of hopelessness or worthlessness.  He denies any crying spells.  His appetite is okay.  His vitals are stable.  He had a good Thanksgiving .  He denies drinking or using any illegal substances.  Patient and his 5 does not want to change his current psychiatric medication.  His energy level is good.  He has blood work which shows creatinine 2.32 which is a stable CBC is normal.  His blood sugar is also normal.    Suicidal Ideation: No Plan Formed: No Patient has means to carry out plan: No  Homicidal Ideation: No Plan Formed: No Patient has means to carry out plan: No  Review of Systems  Musculoskeletal: Positive for back pain.  Skin: Negative for itching and rash.  Neurological: Negative for dizziness and tremors.  Psychiatric/Behavioral: Negative.     Psychiatric: Agitation: No Hallucination: No Depressed Mood: No Insomnia: No Hypersomnia: No Altered Concentration: No Feels Worthless: No Grandiose Ideas: No Belief In Special Powers: No New/Increased Substance Abuse: No Compulsions: No  Neurologic: Headache: No Seizure: No Paresthesias: No  Medical History; Patient has history of arrhythmia, pulmonary embolism and DVT, nephrolithiasis , benign prostate hypertrophy, history of chronic kidney disease , anemia and hyperlipidemia.  He has a pacemaker .  He has history of lithium toxicity.  His primary care physician is Dr. Corky Sox  Recent Results (from the past 2160 hour(s))  Hemoglobin A1c      Status: None   Collection Time: 08/02/15  2:56 PM  Result Value Ref Range   Hgb A1c MFr Bld 5.6 4.8 - 5.6 %    Comment:          Pre-diabetes: 5.7 - 6.4          Diabetes: >6.4          Glycemic control for adults with diabetes: <7.0    Est. average glucose Bld gHb Est-mCnc 114 mg/dL  CBC with Differential/Platelet     Status: Abnormal   Collection Time: 08/02/15  2:56 PM  Result Value Ref Range   WBC 8.3 3.4 - 10.8 x10E3/uL   RBC 4.94 4.14 - 5.80 x10E6/uL   Hemoglobin 15.3 12.6 - 17.7 g/dL   Hematocrit 44.6 37.5 - 51.0 %   MCV 90 79 - 97 fL   MCH 31.0 26.6 - 33.0 pg   MCHC 34.3 31.5 - 35.7 g/dL   RDW 14.3 12.3 - 15.4 %   Platelets 150 150 - 379 x10E3/uL   Neutrophils 74 %   Lymphs 11 %   Monocytes 12 %   Eos 2 %   Basos 0 %   Neutrophils Absolute 6.1 1.4 - 7.0 x10E3/uL   Lymphocytes Absolute 1.0 0.7 - 3.1 x10E3/uL   Monocytes Absolute 1.0 (H) 0.1 - 0.9 x10E3/uL   EOS (ABSOLUTE) 0.1 0.0 - 0.4 x10E3/uL   Basophils Absolute 0.0 0.0 - 0.2 x10E3/uL   Immature Granulocytes 1 %   Immature Grans (Abs)  0.1 0.0 - 0.1 x10E3/uL  Comprehensive metabolic panel     Status: Abnormal   Collection Time: 08/02/15  2:56 PM  Result Value Ref Range   Glucose 84 65 - 99 mg/dL   BUN 24 8 - 27 mg/dL   Creatinine, Ser 2.32 (H) 0.76 - 1.27 mg/dL   GFR calc non Af Amer 28 (L) >59 mL/min/1.73   GFR calc Af Amer 33 (L) >59 mL/min/1.73   BUN/Creatinine Ratio 10 10 - 22   Sodium 140 136 - 144 mmol/L    Comment:               **Please note reference interval change**   Potassium 4.9 3.5 - 5.2 mmol/L    Comment:               **Please note reference interval change**   Chloride 104 97 - 106 mmol/L    Comment:               **Please note reference interval change**   CO2 20 18 - 29 mmol/L   Calcium 10.2 8.6 - 10.2 mg/dL   Total Protein 7.0 6.0 - 8.5 g/dL   Albumin 4.6 3.6 - 4.8 g/dL   Globulin, Total 2.4 1.5 - 4.5 g/dL   Albumin/Globulin Ratio 1.9 1.1 - 2.5   Bilirubin Total 0.3 0.0 - 1.2 mg/dL    Alkaline Phosphatase 90 39 - 117 IU/L   AST 15 0 - 40 IU/L   ALT 13 0 - 44 IU/L    Outpatient Encounter Prescriptions as of 09/16/2015  Medication Sig  . ALPRAZolam (XANAX) 0.5 MG tablet Take 1 tablet (0.5 mg total) by mouth 3 (three) times daily.  . clopidogrel (PLAVIX) 75 MG tablet Take 75 mg by mouth every morning.   . lamoTRIgine (LAMICTAL) 200 MG tablet Take 1 tablet (200 mg total) by mouth daily.  . metoprolol succinate (TOPROL-XL) 25 MG 24 hr tablet Take 1 tablet (25 mg total) by mouth every morning.  Marland Kitchen OLANZapine (ZYPREXA) 20 MG tablet TAKE 1 TABLET BY MOUTH ATBEDTIME.  . simvastatin (ZOCOR) 10 MG tablet Take 10 mg by mouth daily.  . tamsulosin (FLOMAX) 0.4 MG CAPS Take 0.4 mg by mouth every morning.   . traZODone (DESYREL) 50 MG tablet Take 1 tablet (50 mg total) by mouth at bedtime.  . [DISCONTINUED] ALPRAZolam (XANAX) 0.5 MG tablet Take 1 tablet (0.5 mg total) by mouth 3 (three) times daily.  . [DISCONTINUED] lamoTRIgine (LAMICTAL) 200 MG tablet Take 1 tablet (200 mg total) by mouth daily.  . [DISCONTINUED] OLANZapine (ZYPREXA) 20 MG tablet TAKE 1 TABLET BY MOUTH ATBEDTIME.  . [DISCONTINUED] simvastatin (ZOCOR) 20 MG tablet TAKE 1 TABLET BY MOUTH ATBEDTIME  . [DISCONTINUED] traZODone (DESYREL) 50 MG tablet Take 1 tablet (50 mg total) by mouth at bedtime.   No facility-administered encounter medications on file as of 09/16/2015.    Past Psychiatric History/Hospitalization(s) P:atient has bipolar disorder.  He had one psychiatric admission in the past due to mania at behavioral [place].  In 2015 he was admitted on the medical floor because of lithium toxicity  and then transferred to [facility] because of worsening of the symptoms.  In the past he has taken Abilify, Risperdal, Lexapro, Wellbutrin and Navane. Patient has history of previous suicidal attempt, psychosis, mania and hospitalization. Anxiety: Yes Bipolar Disorder: Yes Depression: Yes Mania:  Yes Psychosis: Yes Schizophrenia: No Personality Disorder: No Hospitalization for psychiatric illness:Yes  Physical Exam: Constitutional:  BP 140/88  mmHg  Pulse 71  Ht 5\' 9"  (1.753 m)  Wt 188 lb 6.4 oz (85.458 kg)  BMI 27.81 kg/m2  General Appearance: well nourished and Casually dressed.  He maintained fair eye contact.  Musculoskeletal: Strength & Muscle Tone: within normal limits Gait & Station: normal Patient leans: N/A   Mental Status Examination; Patient is casually dressed and groomed.  He maintained fair eye contact.  His speech is  slow but clear and coherent.  He is cooperative and pleasant.  His thought process slow but logical.  His attention and concentration is fair.  He denies any paranoia, hallucination or any obsessive thoughts.  He denies any auditory or visual hallucination.  His psychomotor activity is normal.  He described his mood is okay and his affect is labile.  There were no delusions at this time.  He is alert and oriented x3 however he has difficulty remembering things.  His fund of knowledge is adequate.  His insight judgment and impulse control is okay.   Established Problem, Stable/Improving (1), Review or order clinical lab tests (1), Review of Last Therapy Session (1) and Review of Medication Regimen & Side Effects (2)  Assessment: Axis I: Bipolar disorder NOS  Axis II: Deferred  Axis III: See medical history  Plan:  Patient is stable on his current psychiatric medication.  He has no side effects.  He does not ask early refills for his Xanax.  He has no rash, itching or any headaches.  I will continue Xanax 0.5 mg 3 times a day , Lamictal 200 mg daily, olanzapine 20 mg at bedtime and trazodone 50 mg at bedtime.  He has no EPS, shakes, tremors.  I review his blood work results.  He has stable creatinine.  Discussed medication side effects and benefits. Patient has history of renal insufficiency.  Recommended to call us back if he has any question or  any concern.  Follow-up in 3 months.   Leeandre Nordling T., MD 09/16/2015

## 2015-10-27 ENCOUNTER — Other Ambulatory Visit: Payer: Self-pay | Admitting: Family Medicine

## 2015-10-27 NOTE — Telephone Encounter (Signed)
Appointment schedule for tomorrow 10/28/2015. Will refill all meds at that time. Has not been seen in a year

## 2015-10-28 ENCOUNTER — Ambulatory Visit (INDEPENDENT_AMBULATORY_CARE_PROVIDER_SITE_OTHER): Payer: Medicare Other | Admitting: Family Medicine

## 2015-10-28 ENCOUNTER — Encounter: Payer: Self-pay | Admitting: Family Medicine

## 2015-10-28 VITALS — BP 112/78 | HR 78 | Temp 98.7°F | Resp 18 | Ht 69.0 in | Wt 187.9 lb

## 2015-10-28 DIAGNOSIS — I10 Essential (primary) hypertension: Secondary | ICD-10-CM

## 2015-10-28 DIAGNOSIS — Q6100 Congenital renal cyst, unspecified: Secondary | ICD-10-CM

## 2015-10-28 DIAGNOSIS — Z86718 Personal history of other venous thrombosis and embolism: Secondary | ICD-10-CM | POA: Insufficient documentation

## 2015-10-28 DIAGNOSIS — E232 Diabetes insipidus: Secondary | ICD-10-CM | POA: Insufficient documentation

## 2015-10-28 DIAGNOSIS — N183 Chronic kidney disease, stage 3 unspecified: Secondary | ICD-10-CM | POA: Insufficient documentation

## 2015-10-28 DIAGNOSIS — I2510 Atherosclerotic heart disease of native coronary artery without angina pectoris: Secondary | ICD-10-CM | POA: Diagnosis not present

## 2015-10-28 DIAGNOSIS — N281 Cyst of kidney, acquired: Secondary | ICD-10-CM

## 2015-10-28 DIAGNOSIS — E785 Hyperlipidemia, unspecified: Secondary | ICD-10-CM | POA: Diagnosis not present

## 2015-10-28 DIAGNOSIS — N400 Enlarged prostate without lower urinary tract symptoms: Secondary | ICD-10-CM

## 2015-10-28 DIAGNOSIS — I5022 Chronic systolic (congestive) heart failure: Secondary | ICD-10-CM | POA: Diagnosis not present

## 2015-10-28 DIAGNOSIS — F319 Bipolar disorder, unspecified: Secondary | ICD-10-CM

## 2015-10-28 DIAGNOSIS — I442 Atrioventricular block, complete: Secondary | ICD-10-CM

## 2015-10-28 DIAGNOSIS — D638 Anemia in other chronic diseases classified elsewhere: Secondary | ICD-10-CM | POA: Insufficient documentation

## 2015-10-28 DIAGNOSIS — Z23 Encounter for immunization: Secondary | ICD-10-CM

## 2015-10-28 MED ORDER — CLOPIDOGREL BISULFATE 75 MG PO TABS: 75.0000 mg | ORAL_TABLET | Freq: Every morning | ORAL | Status: DC

## 2015-10-28 MED ORDER — METOPROLOL SUCCINATE ER 25 MG PO TB24: 25.0000 mg | ORAL_TABLET | Freq: Every morning | ORAL | Status: DC

## 2015-10-28 MED ORDER — SIMVASTATIN 20 MG PO TABS: 20.0000 mg | ORAL_TABLET | Freq: Every day | ORAL | Status: DC

## 2015-10-28 MED ORDER — TAMSULOSIN HCL 0.4 MG PO CAPS: 0.4000 mg | ORAL_CAPSULE | Freq: Every morning | ORAL | Status: DC

## 2015-10-28 NOTE — Progress Notes (Signed)
Name: Jeffrey Herring   MRN: [id number]    DOB: 1950/12/07   Date:10/28/2015       Progress Note  Subjective  Chief Complaint  Chief Complaint  Patient presents with  . Hyperlipidemia    6 month follow up  . Depression  . Cardiac Dysrhythmia  . Benign Prostatic Hypertrophy    HPI  Hyperlipidemia  Patient has a history of hyperlipidemia for over 5 years.  Current medical regimen consist of simvastatin 20 mg daily at bedtime .  Compliance is good .  Diet and exercise are currently followed fairly well .  Risk factors for cardiovascular disease include hyperlipidemia hypertension .   There have been no side effects from the medication.    Depression  Long-standing history of hypertension for which she is currently seeing a psychiatrist is better control and any prior visit here. His current regimen includes alprazolam limiting total Zyprexa and trazodone per psychiatrist.   Cardiac arrhythmia  History of cardiac arrhythmia being followed by cardiologist. He is currently on metoprolol 25 mg once daily and this worked effectively. Denies any recent chest pain or palpitations.    ASCVD  History of ASCVD. No current anginal pain or palpitations.   Cardiac pacer  History of congenital cardiac disease requiring a pacemaker as teens which is being maintained by cardiologist. The current irregularity of his heart rate has been noted no chest pain dizziness.  BPH  Several year  history of BPH symptomatology. This consists of frequent flow and nocturia. He is on Flomax on a regular basis with some moderate control.   Past Medical History  Diagnosis Date  . Depression   . Hyperlipemia   . Pacemaker   . Arrhythmia   . Diabetes insipidus ([place])   . History of kidney stones   . History of DVT (deep vein thrombosis)   . History of pulmonary embolism   . Bipolar 1 disorder ([place])   . Lithium toxicity   . Stage III chronic kidney disease     Social History  Substance Use Topics   . Smoking status: Current Every Day Smoker -- 0.10 packs/day    Types: Cigars, Cigarettes  . Smokeless tobacco: Never Used  . Alcohol Use: No     Current outpatient prescriptions:  .  Ascorbic Acid (VITAMIN C) 100 MG tablet, Take 100 mg by mouth daily., Disp: , Rfl:  .  cholecalciferol (VITAMIN D) 1000 units tablet, Take 1,000 Units by mouth daily., Disp: , Rfl:  .  ferrous fumarate (HEMOCYTE - 106 MG FE) 325 (106 Fe) MG TABS tablet, Take 1 tablet by mouth., Disp: , Rfl:  .  ALPRAZolam (XANAX) 0.5 MG tablet, Take 1 tablet (0.5 mg total) by mouth 3 (three) times daily., Disp: 90 tablet, Rfl: 2 .  clopidogrel (PLAVIX) 75 MG tablet, Take 1 tablet (75 mg total) by mouth every morning., Disp: 90 tablet, Rfl: 1 .  lamoTRIgine (LAMICTAL) 200 MG tablet, Take 1 tablet (200 mg total) by mouth daily., Disp: 90 tablet, Rfl: 0 .  metoprolol succinate (TOPROL-XL) 25 MG 24 hr tablet, Take 1 tablet (25 mg total) by mouth every morning., Disp: 90 tablet, Rfl: 1 .  OLANZapine (ZYPREXA) 20 MG tablet, TAKE 1 TABLET BY MOUTH ATBEDTIME., Disp: 90 tablet, Rfl: 0 .  simvastatin (ZOCOR) 20 MG tablet, Take 1 tablet (20 mg total) by mouth at bedtime., Disp: 90 tablet, Rfl: 1 .  tamsulosin (FLOMAX) 0.4 MG CAPS capsule, Take 1 capsule (0.4 mg total) by mouth  every morning., Disp: 90 capsule, Rfl: 1 .  traZODone (DESYREL) 50 MG tablet, Take 1 tablet (50 mg total) by mouth at bedtime., Disp: 90 tablet, Rfl: 0  Allergies  Allergen Reactions  . Aspirin Hives, Shortness Of Breath and Palpitations  . Codeine Swelling  . Demerol [Meperidine] Swelling  . Aspirin Hives  . Codeine Nausea And Vomiting  . Demerol [Meperidine] Nausea And Vomiting  . Tylenol [Acetaminophen] Other (See Comments)    unknown    Review of Systems  Constitutional: Positive for malaise/fatigue. Negative for fever, chills and weight loss.  HENT: Negative for congestion, hearing loss, sore throat and tinnitus.   Eyes: Negative for blurred vision,  double vision and redness.  Respiratory: Negative for cough, hemoptysis and shortness of breath.   Cardiovascular: Negative for chest pain, palpitations, orthopnea, claudication and leg swelling.       Cardiac arrhythmia with pacemaker  Gastrointestinal: Negative for heartburn, nausea, vomiting, diarrhea, constipation and blood in stool.  Genitourinary: Negative for dysuria, urgency, frequency and hematuria.  Musculoskeletal: Negative for myalgias, back pain, joint pain, falls and neck pain.  Skin: Negative for itching.  Neurological: Negative for dizziness, tingling, tremors, focal weakness, seizures, loss of consciousness, weakness and headaches.  Endo/Heme/Allergies: Does not bruise/bleed easily.  Psychiatric/Behavioral: Positive for depression. Negative for substance abuse. The patient is not nervous/anxious and does not have insomnia.      Objective  Filed Vitals:   10/28/15 1119  BP: 112/78  Pulse: 78  Temp: 98.7 F (37.1 C)  TempSrc: Oral  Resp: 18  Height: 5\' 9"  (1.753 m)  Weight: 187 lb 14.4 oz (85.231 kg)  SpO2: 93%     Physical Exam  Constitutional: He is oriented to person, place, and time and well-developed, well-nourished, and in no distress.  HENT:  Head: Normocephalic.  Eyes: EOM are normal. Pupils are equal, round, and reactive to light.  Neck: Normal range of motion. Neck supple. No thyromegaly present.  Cardiovascular: Normal rate, regular rhythm and normal heart sounds.   No murmur heard. Pulmonary/Chest: Effort normal and breath sounds normal. No respiratory distress. He has no wheezes.  Abdominal: Soft. Bowel sounds are normal.  Musculoskeletal: Normal range of motion. He exhibits no edema.  Lymphadenopathy:    He has no cervical adenopathy.  Neurological: He is alert and oriented to person, place, and time. No cranial nerve deficit. Gait normal. Coordination normal.  Skin: Skin is warm and dry. No rash noted.  Psychiatric: Judgment normal.  Affect  is much less depressed than on prior visits      Assessment & Plan  1. HLD (hyperlipidemia) Well-controlled  2. Essential hypertension Well-controlled  3. BPH (benign prostatic hyperplasia) Responding to Flomax  4. ASCVD (arteriosclerotic cardiovascular disease) Currently stable  5. Need for influenza vaccination Given - Flu vaccine HIGH DOSE PF (Fluzone High dose)  6. Need for pneumococcal vaccination Given - Pneumococcal conjugate vaccine 13-valent  7. Chronic systolic CHF (congestive heart failure) (HCC) Stable  8. Complete atrioventricular block (HCC) Stable and followed by cardiologist  9. Arteriosclerosis of coronary artery Followed by cardiologist  10. Stage III chronic kidney disease Followed by nephrologist is stable  11. Benign fibroma of prostate Doing well on Flomax  12. Kidney cysts Followed by urologist  13. Bipolar 1 disorder ([place]) Followed by psychiatrist and is currently stable

## 2015-11-04 DIAGNOSIS — N400 Enlarged prostate without lower urinary tract symptoms: Secondary | ICD-10-CM | POA: Insufficient documentation

## 2015-12-14 DIAGNOSIS — I2510 Atherosclerotic heart disease of native coronary artery without angina pectoris: Secondary | ICD-10-CM | POA: Diagnosis not present

## 2015-12-14 DIAGNOSIS — Z8679 Personal history of other diseases of the circulatory system: Secondary | ICD-10-CM | POA: Diagnosis not present

## 2015-12-14 DIAGNOSIS — Z45018 Encounter for adjustment and management of other part of cardiac pacemaker: Secondary | ICD-10-CM | POA: Diagnosis not present

## 2015-12-14 DIAGNOSIS — I442 Atrioventricular block, complete: Secondary | ICD-10-CM | POA: Diagnosis not present

## 2015-12-16 ENCOUNTER — Encounter (HOSPITAL_COMMUNITY): Payer: Self-pay | Admitting: Psychiatry

## 2015-12-16 ENCOUNTER — Ambulatory Visit (INDEPENDENT_AMBULATORY_CARE_PROVIDER_SITE_OTHER): Payer: Medicare Other | Admitting: Psychiatry

## 2015-12-16 VITALS — BP 138/80 | HR 81 | Ht 70.0 in | Wt 187.6 lb

## 2015-12-16 DIAGNOSIS — I2510 Atherosclerotic heart disease of native coronary artery without angina pectoris: Secondary | ICD-10-CM

## 2015-12-16 DIAGNOSIS — F319 Bipolar disorder, unspecified: Secondary | ICD-10-CM

## 2015-12-16 MED ORDER — OLANZAPINE 20 MG PO TABS: ORAL_TABLET | ORAL | Status: DC

## 2015-12-16 MED ORDER — ALPRAZOLAM 0.5 MG PO TABS: 0.5000 mg | ORAL_TABLET | Freq: Three times a day (TID) | ORAL | Status: DC

## 2015-12-16 MED ORDER — TRAZODONE HCL 50 MG PO TABS: 50.0000 mg | ORAL_TABLET | Freq: Every day | ORAL | Status: DC

## 2015-12-16 MED ORDER — LAMOTRIGINE 200 MG PO TABS: 200.0000 mg | ORAL_TABLET | Freq: Every day | ORAL | Status: DC

## 2015-12-16 NOTE — Progress Notes (Signed)
[facility] [phone] Progress Note  QUILLIAN VARBLE [id number] 65 y.o.  12/16/2015 3:15 PM  Chief Complaint:  Medication management and follow-up.            History of Present Illness: Jeffrey Herring came for his followup appointment with his wife.   He is taking his medication as prescribed.  He denies any paranoia or any mood swing.  As per wife he sleeping good and he has no issues with the medication.  Patient also reported improvement in his anxiety and depression.  He denies any feeling of hopelessness worthlessness or any panic attack.  He has no tremors shakes or any EPS.  He liked to go to [place] in few weeks.  His appetite is okay.  His vitals are stable.  His taking Zyprexa, Lamictal, trazodone and Xanax.  He has no rash or itching.  His energy level is good.  Patient denies drinking or using any illegal substances.  He lives with his wife who is very supportive.    Suicidal Ideation: No Plan Formed: No Patient has means to carry out plan: No  Homicidal Ideation: No Plan Formed: No Patient has means to carry out plan: No  Review of Systems  Musculoskeletal: Positive for back pain.  Skin: Negative for itching and rash.  Neurological: Negative for dizziness and tremors.  Psychiatric/Behavioral: Negative.     Psychiatric: Agitation: No Hallucination: No Depressed Mood: No Insomnia: No Hypersomnia: No Altered Concentration: No Feels Worthless: No Grandiose Ideas: No Belief In Special Powers: No New/Increased Substance Abuse: No Compulsions: No  Neurologic: Headache: No Seizure: No Paresthesias: No  Medical History; Patient has history of arrhythmia, pulmonary embolism and DVT, nephrolithiasis , benign prostate hypertrophy, history of chronic kidney disease , anemia and hyperlipidemia.  He has a pacemaker .  He has history of lithium toxicity.  His primary care physician is Dr. Corky Sox  No results found for this or any previous visit (from the past 2160  hour(s)).  Outpatient Encounter Prescriptions as of 12/16/2015  Medication Sig  . ALPRAZolam (XANAX) 0.5 MG tablet Take 1 tablet (0.5 mg total) by mouth 3 (three) times daily.  . Ascorbic Acid (VITAMIN C) 100 MG tablet Take 100 mg by mouth daily.  . cholecalciferol (VITAMIN D) 1000 units tablet Take 1,000 Units by mouth daily.  . clopidogrel (PLAVIX) 75 MG tablet Take 1 tablet (75 mg total) by mouth every morning.  . ferrous fumarate (HEMOCYTE - 106 MG FE) 325 (106 Fe) MG TABS tablet Take 1 tablet by mouth.  . lamoTRIgine (LAMICTAL) 200 MG tablet Take 1 tablet (200 mg total) by mouth daily.  . metoprolol succinate (TOPROL-XL) 25 MG 24 hr tablet Take 1 tablet (25 mg total) by mouth every morning.  Marland Kitchen OLANZapine (ZYPREXA) 20 MG tablet TAKE 1 TABLET BY MOUTH ATBEDTIME.  . simvastatin (ZOCOR) 20 MG tablet Take 1 tablet (20 mg total) by mouth at bedtime.  . tamsulosin (FLOMAX) 0.4 MG CAPS capsule Take 1 capsule (0.4 mg total) by mouth every morning.  . traZODone (DESYREL) 50 MG tablet Take 1 tablet (50 mg total) by mouth at bedtime.  . [DISCONTINUED] ALPRAZolam (XANAX) 0.5 MG tablet Take 1 tablet (0.5 mg total) by mouth 3 (three) times daily.  . [DISCONTINUED] lamoTRIgine (LAMICTAL) 200 MG tablet Take 1 tablet (200 mg total) by mouth daily.  . [DISCONTINUED] OLANZapine (ZYPREXA) 20 MG tablet TAKE 1 TABLET BY MOUTH ATBEDTIME.  . [DISCONTINUED] traZODone (DESYREL) 50 MG tablet Take 1 tablet (50 mg total)  by mouth at bedtime.   No facility-administered encounter medications on file as of 12/16/2015.    Past Psychiatric History/Hospitalization(s) P:atient has bipolar disorder.  He had one psychiatric admission in the past due to mania at behavioral [facility].  In 2015 he was admitted on the medical floor because of lithium toxicity  and then transferred to [facility] because of worsening of the symptoms.  In the past he has taken Abilify, Risperdal, Lexapro, Wellbutrin and Navane. Patient has  history of previous suicidal attempt, psychosis, mania and hospitalization. Anxiety: Yes Bipolar Disorder: Yes Depression: Yes Mania: Yes Psychosis: Yes Schizophrenia: No Personality Disorder: No Hospitalization for psychiatric illness:Yes  Physical Exam: Constitutional:  BP 138/80 mmHg  Pulse 81  Ht 5\' 10"  (1.778 m)  Wt 187 lb 9.6 oz (85.095 kg)  BMI 26.92 kg/m2  General Appearance: well nourished and Casually dressed.  He maintained fair eye contact.  Musculoskeletal: Strength & Muscle Tone: within normal limits Gait & Station: normal Patient leans: N/A   Mental Status Examination; Patient is casually dressed and groomed.  He described his mood euthymic and his affect is appropriate.  He maintained fair eye contact.  His speech is  slow but clear and coherent.  He is cooperative and pleasant.  His thought process slow but logical.  His attention and concentration is fair.  He denies any paranoia, hallucination or any obsessive thoughts.  He denies any auditory or visual hallucination.  His psychomotor activity is normal. There were no delusions at this time.  He is alert and oriented x3 however he has difficulty remembering things.  His fund of knowledge is adequate.  His insight judgment and impulse control is okay.   Established Problem, Stable/Improving (1), Review or order clinical lab tests (1), Review of Last Therapy Session (1) and Review of Medication Regimen & Side Effects (2)  Assessment: Axis I: Bipolar disorder NOS  Axis II: Deferred  Axis III: See medical history  Plan:  Patient is stable on his current psychiatric medication.  He has no side effects.  I will continue Xanax 0.5 mg 3 times a day , Lamictal 200 mg daily, olanzapine 20 mg at bedtime and trazodone 50 mg at bedtime.  He has changed his pharmacy and now getting medication from [facility] in [place].  Recommended to call us back if he has any question or any concern.  Follow-up in 3  months.   Jeffrey Mesta T., MD 12/16/2015

## 2016-02-28 ENCOUNTER — Encounter: Payer: Medicare Other | Admitting: Family Medicine

## 2016-03-10 IMAGING — US US RENAL
1 series · 14 of 18 positions shown · non-contrast
Comparison: None.

CLINICAL DATA: Acute kidney injury and urinary tract infection

EXAM:
RENAL/URINARY TRACT ULTRASOUND COMPLETE

[Series 1: us renal · 0.21mm/px · 14 of 18 slices shown]
[im 1/18]
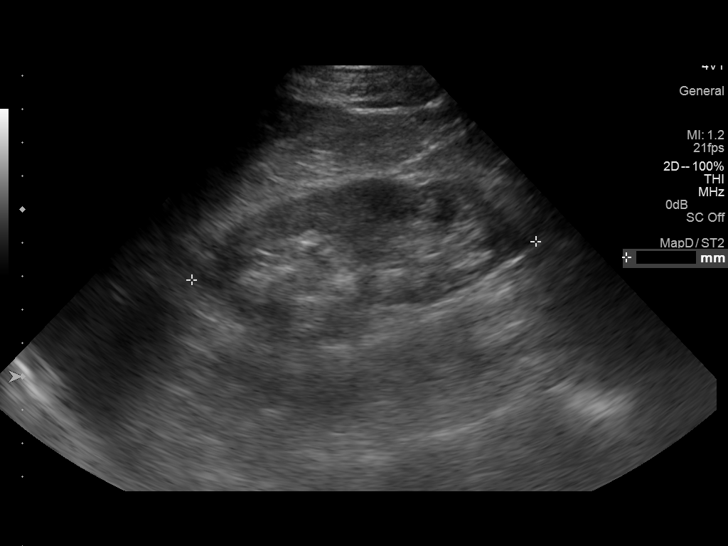
[im 2/18]
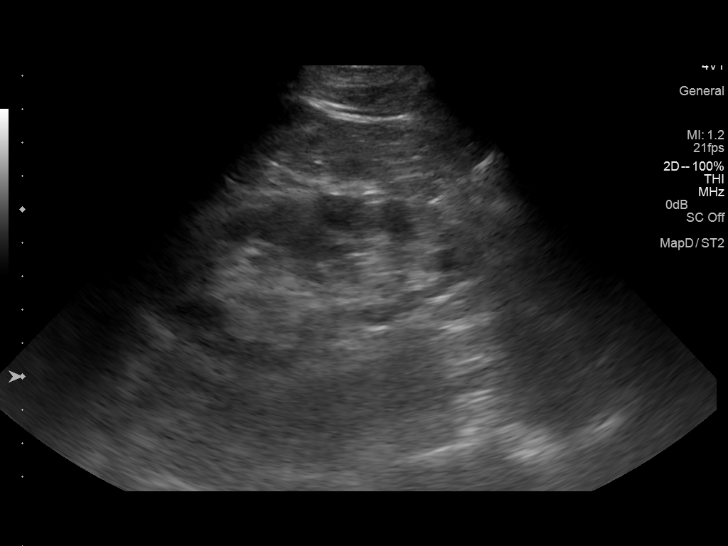
[im 4/18]
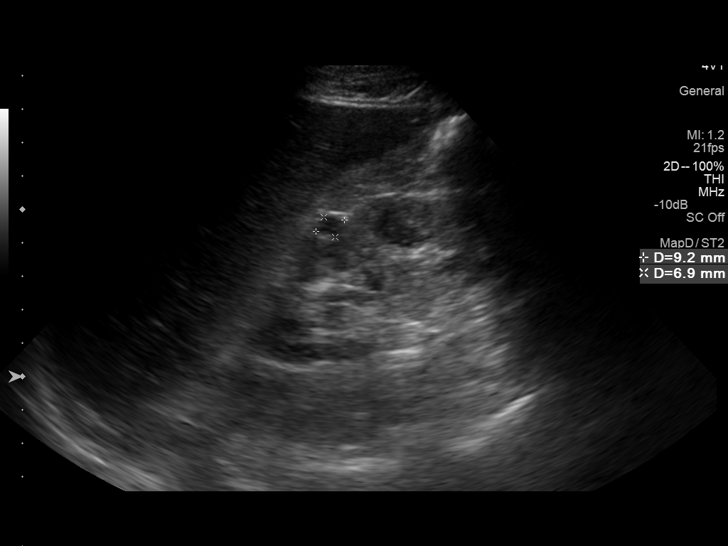
[im 5/18]
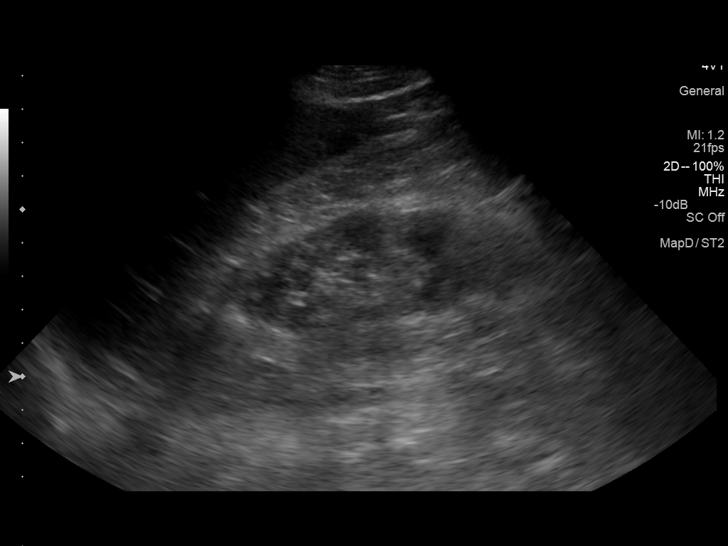
[im 6/18]
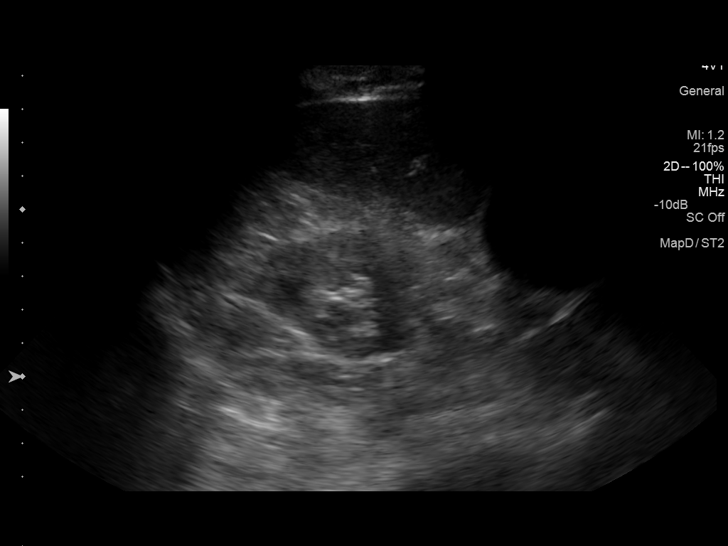
[im 8/18]
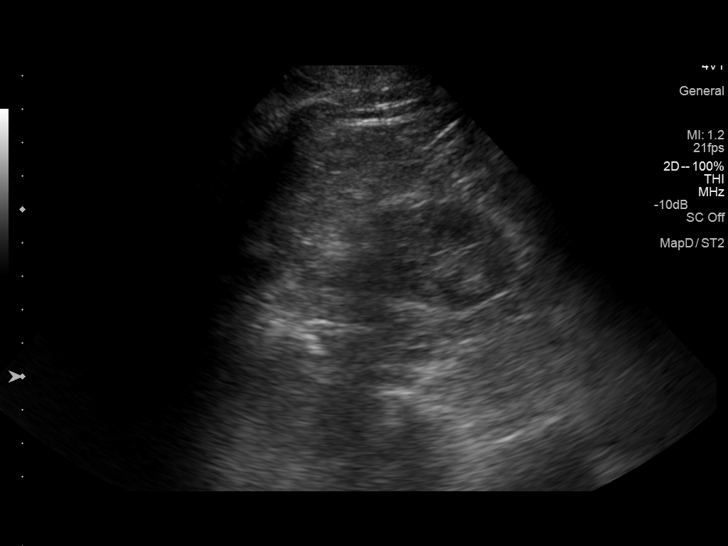
[im 9/18]
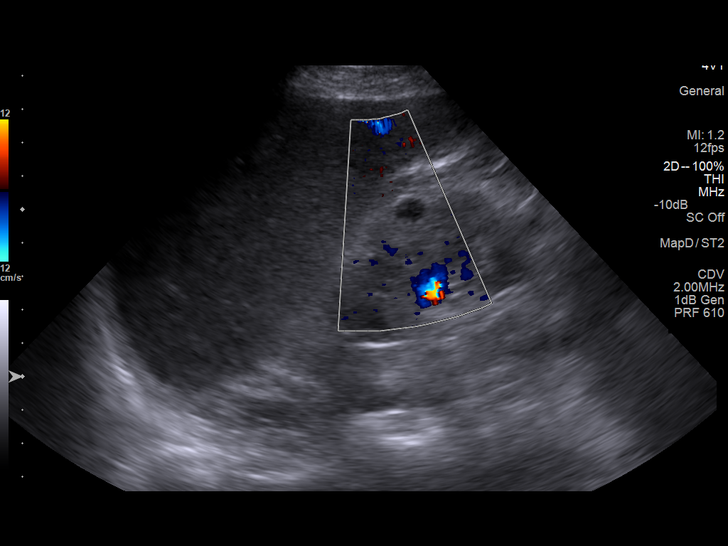
[im 10/18]
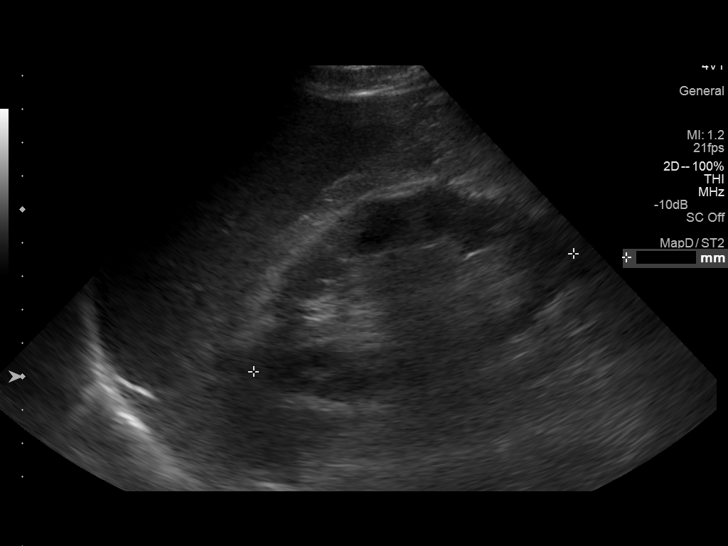
[im 11/18]
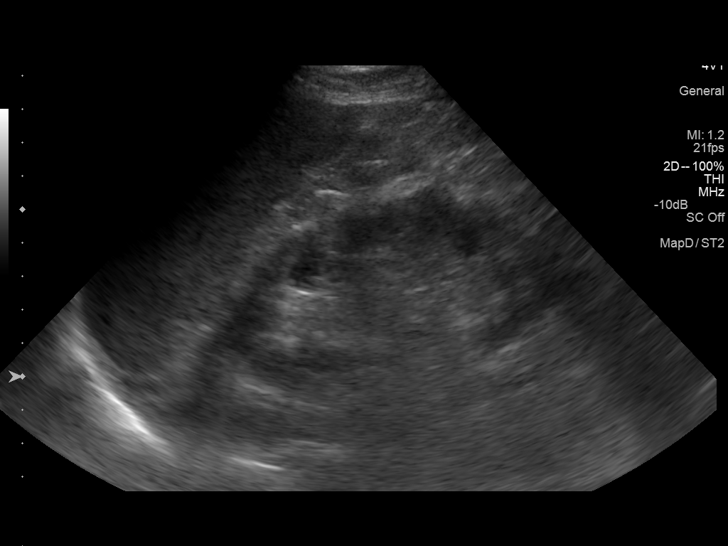
[im 13/18]
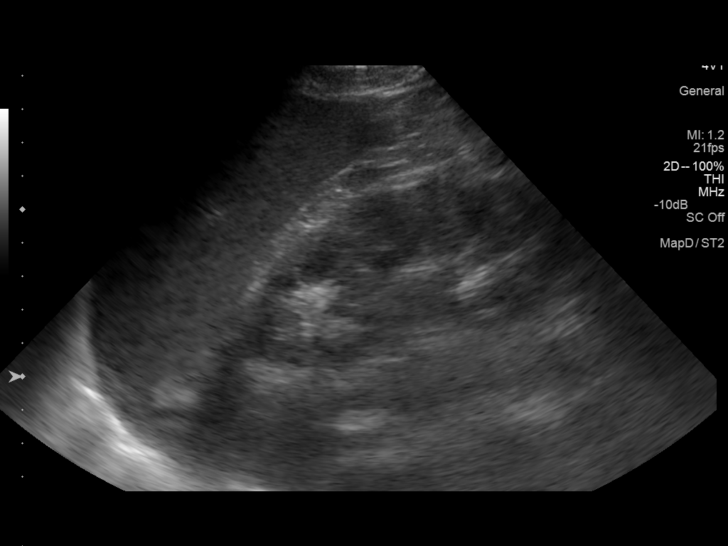
[im 14/18]
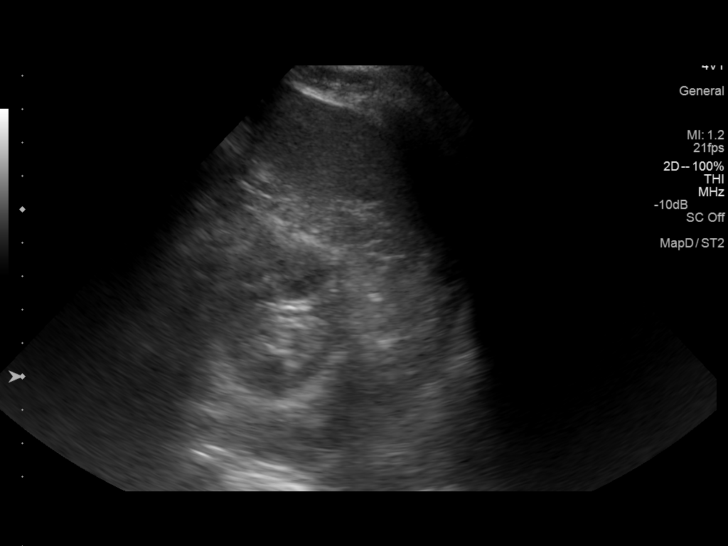
[im 15/18]
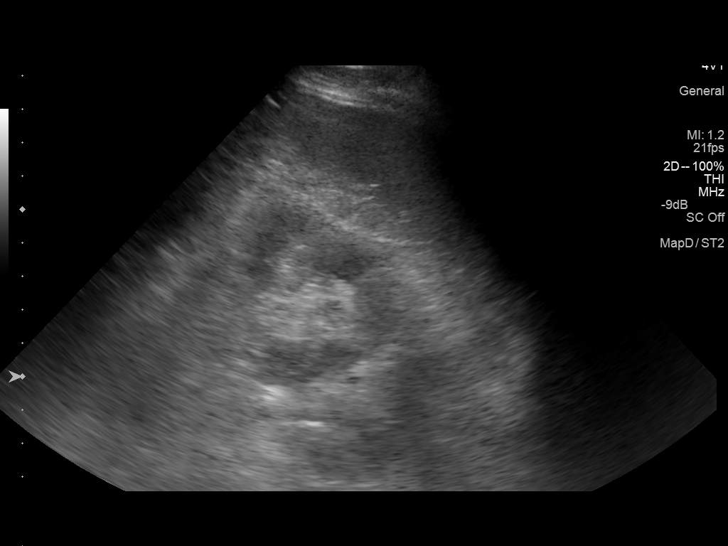
[im 17/18]
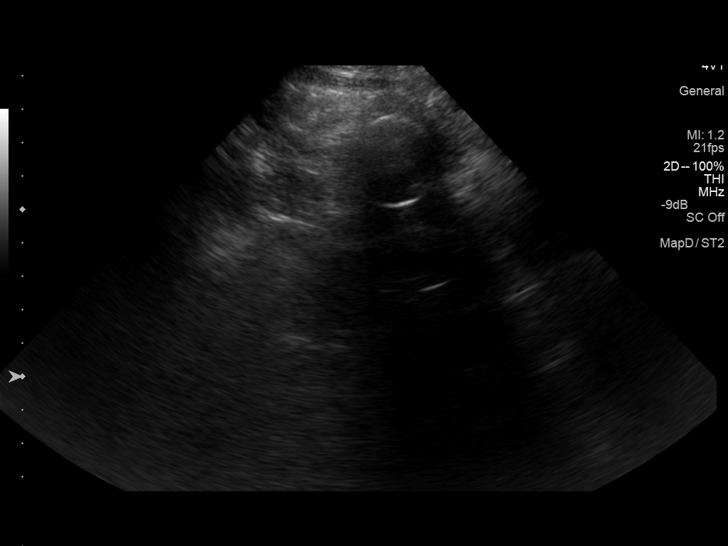
[im 18/18]
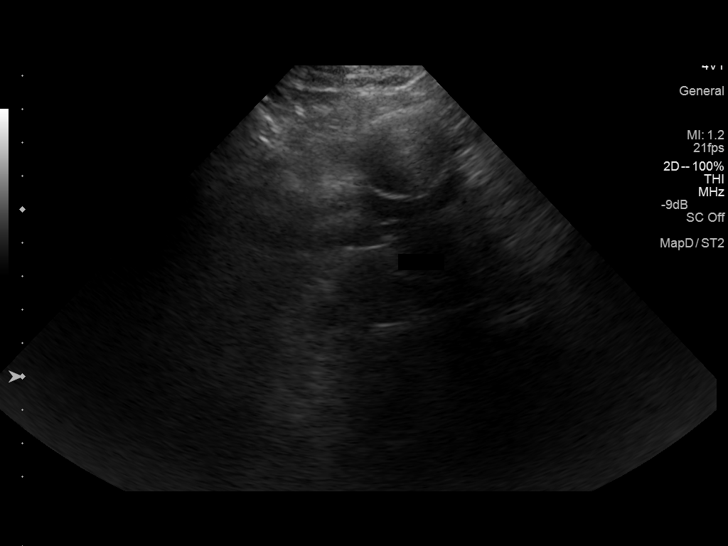

[14 of 18 positions shown; findings below may reference images not displayed]

FINDINGS: Right Kidney:

Length: 10 cm. Echogenic cortex with abnormal cortical medullary
differentiation. There is a 9 mm simple appearing cyst in the
interpolar region. No hydronephrosis or evidence of solid mass.

Left Kidney:

Length: 10 cm. Echogenic cortex with abnormal cortical medullary
differentiation. No hydronephrosis or evidence of mass.

Bladder:

Completely decompressed around a Foley catheter.
IMPRESSION: 1. No hydronephrosis.
2. Chronic medical renal disease.

## 2016-03-14 IMAGING — CT CT HEAD W/O CM
2 of 3 series · 16 of 30 positions shown, 18 images · non-contrast
Comparison: There are no previous studies for comparison.

CLINICAL DATA: Mental status change and confusion

EXAM:
CT HEAD WITHOUT CONTRAST
TECHNIQUE: Contiguous axial images were obtained from the base of the skull
through the vertex without intravenous contrast.

[Series 2: head 5.0 h30s · axial · 0.41mm/px · z∈[+1298,+1428]mm · 8 of 34 slices shown, 10 images (1 of 2)]
[im 4/34  brain]
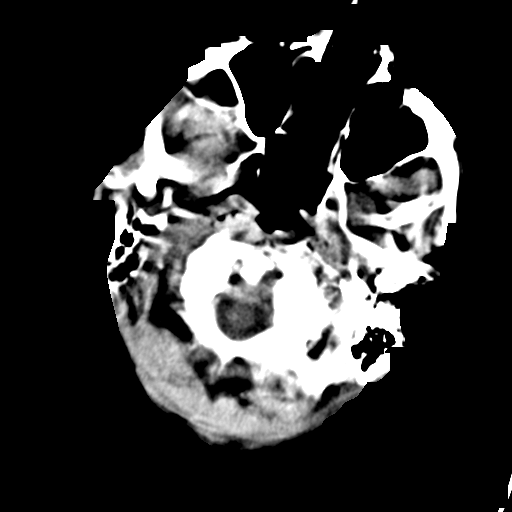
[im 4/34  bone]
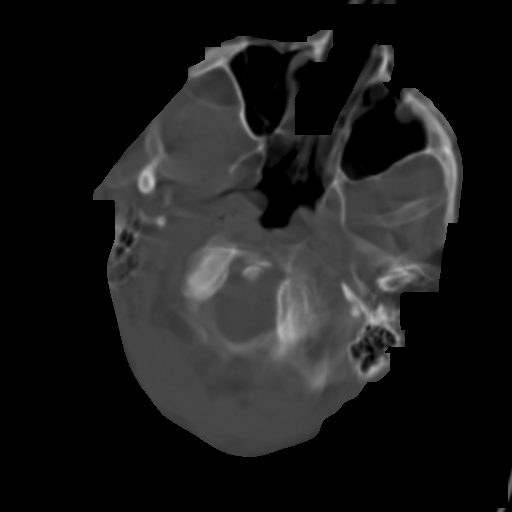
[im 8/34  brain]
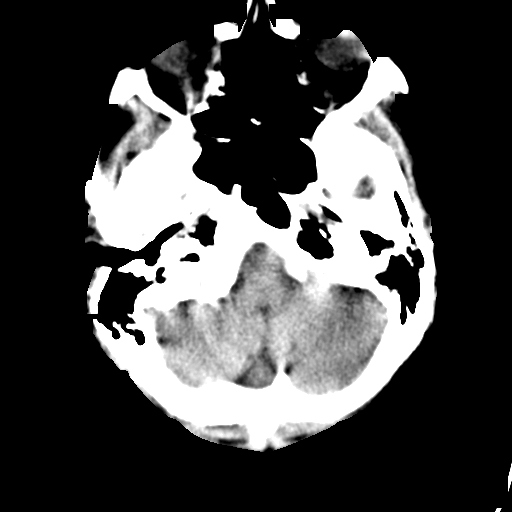
[im 12/34  brain]
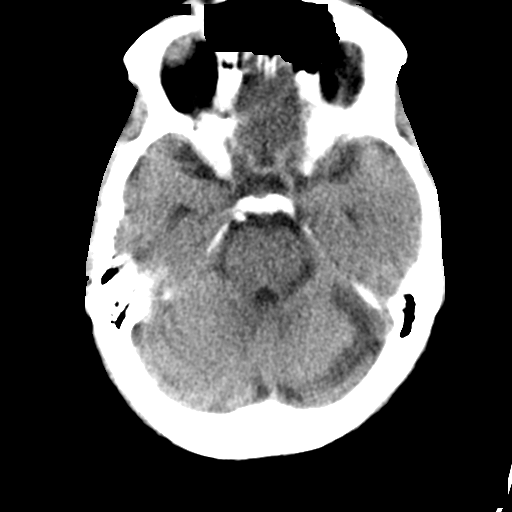
[im 15/34  brain]
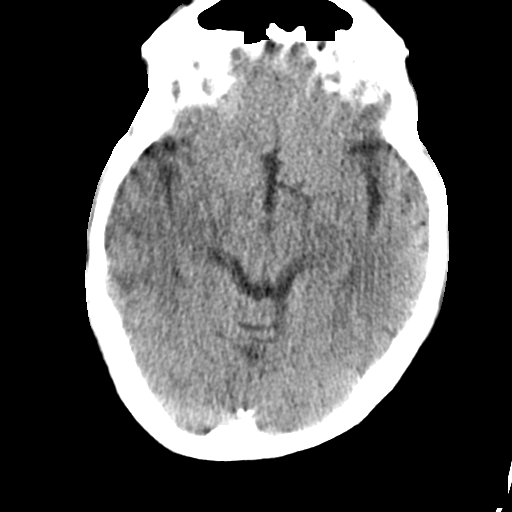
[im 19/34  brain]
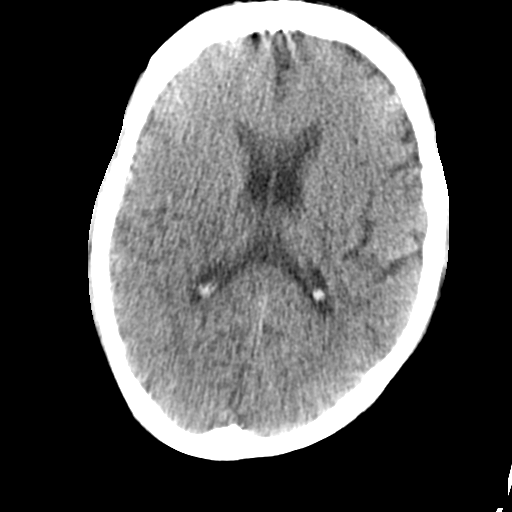
[im 19/34  bone]
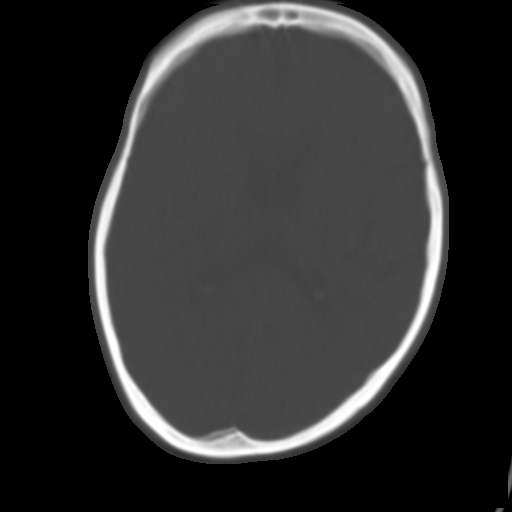
[im 23/34  brain]
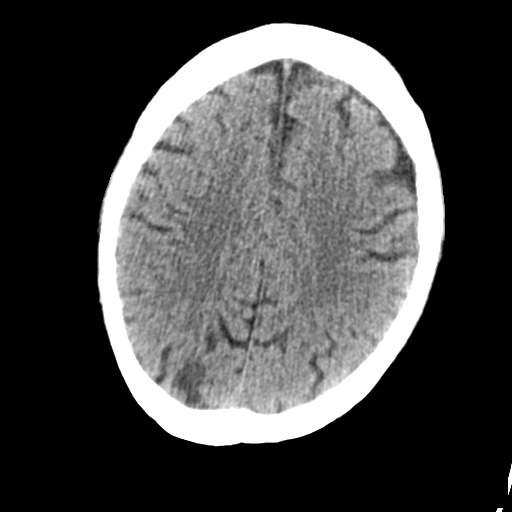
[im 26/34  brain]
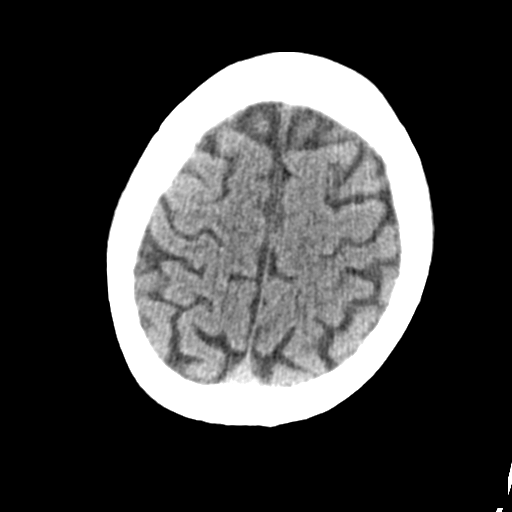
[im 30/34  brain]
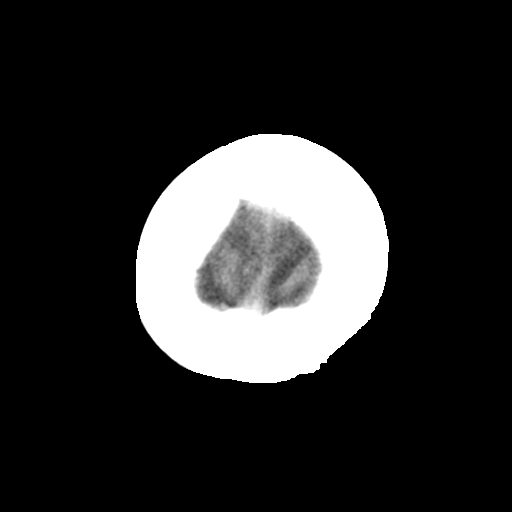

[Series 4: head 5.0 h30s · axial · 0.47mm/px · z∈[+1298,+1428]mm · 8 of 34 slices shown (2 of 2)]
[im 4/34  brain]
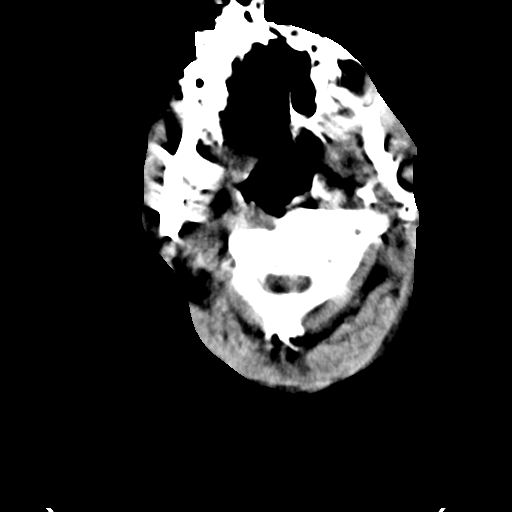
[im 8/34  brain]
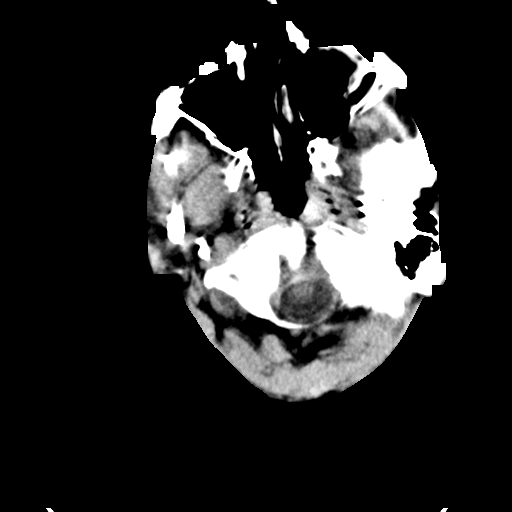
[im 12/34  brain]
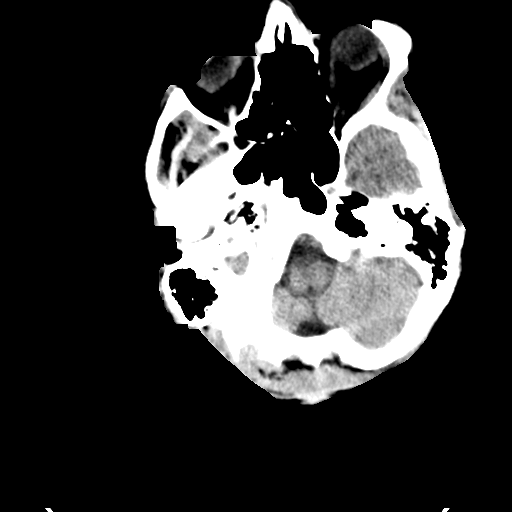
[im 15/34  brain]
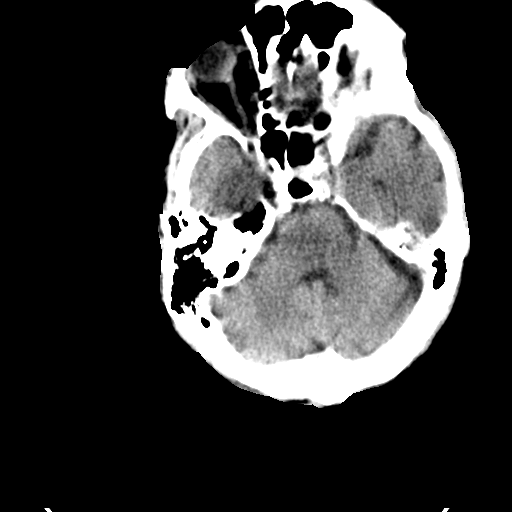
[im 19/34  brain]
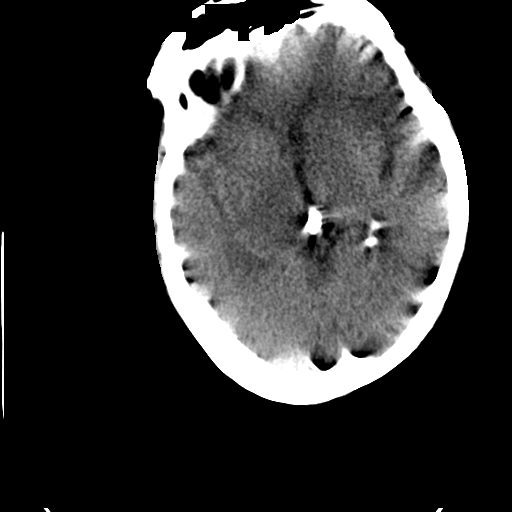
[im 23/34  brain]
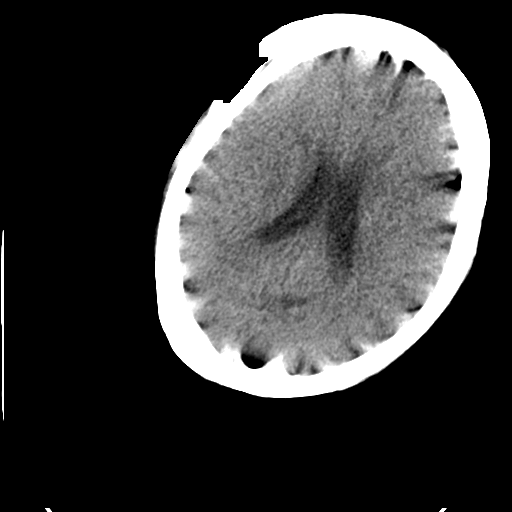
[im 26/34  brain]
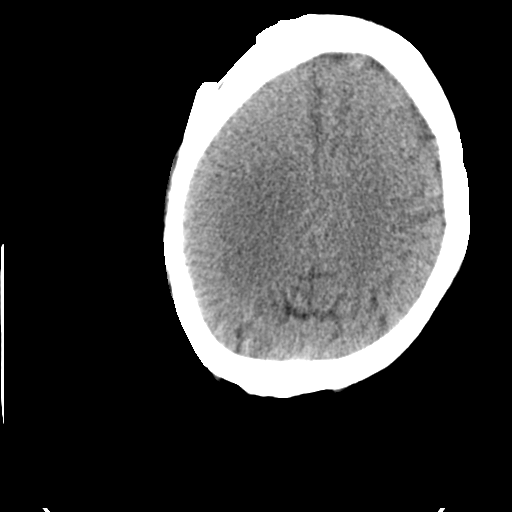
[im 30/34  brain]
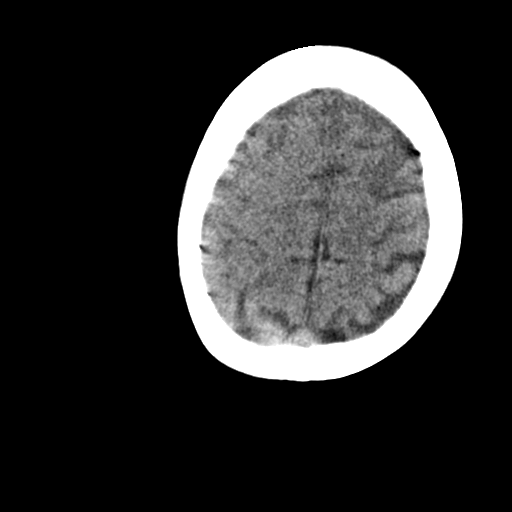

[16 of 30 positions shown; findings below may reference images not displayed]

FINDINGS: The study is limited due to motion artifact. The ventricles are
normal in size and position. There is a focus of encephalomalacia in
the posterior right parietal lobe. There is no acute intracranial
hemorrhage nor objective evidence of an evolving ischemic event. The
cerebellum and brainstem are grossly normal.

At bone window settings as best as can be determined there is no
acute skull fracture. The observed paranasal sinuses and mastoid air
cells are clear.
IMPRESSION: The study is limited due to motion artifact. There is no definite
acute intracranial abnormality. Hypodensity in the right posterior
parietal lobe is consistent with encephalomalacia.

## 2016-03-17 ENCOUNTER — Other Ambulatory Visit (HOSPITAL_COMMUNITY): Payer: Self-pay | Admitting: Psychiatry

## 2016-03-21 ENCOUNTER — Ambulatory Visit (INDEPENDENT_AMBULATORY_CARE_PROVIDER_SITE_OTHER): Payer: Medicare Other | Admitting: Psychiatry

## 2016-03-21 ENCOUNTER — Encounter (HOSPITAL_COMMUNITY): Payer: Self-pay | Admitting: Psychiatry

## 2016-03-21 VITALS — BP 132/80 | HR 85 | Ht 69.0 in | Wt 190.2 lb

## 2016-03-21 DIAGNOSIS — F319 Bipolar disorder, unspecified: Secondary | ICD-10-CM

## 2016-03-21 DIAGNOSIS — I2510 Atherosclerotic heart disease of native coronary artery without angina pectoris: Secondary | ICD-10-CM

## 2016-03-21 MED ORDER — TRAZODONE HCL 50 MG PO TABS: 50.0000 mg | ORAL_TABLET | Freq: Every day | ORAL | Status: DC

## 2016-03-21 MED ORDER — ALPRAZOLAM 0.5 MG PO TABS: 0.5000 mg | ORAL_TABLET | Freq: Three times a day (TID) | ORAL | Status: DC

## 2016-03-21 MED ORDER — LAMOTRIGINE 200 MG PO TABS: 200.0000 mg | ORAL_TABLET | Freq: Every day | ORAL | Status: DC

## 2016-03-21 MED ORDER — OLANZAPINE 20 MG PO TABS: ORAL_TABLET | ORAL | Status: DC

## 2016-03-21 NOTE — Progress Notes (Signed)
[facility] [phone] Progress Note  Jeffrey Herring [id number] 65 y.o.  03/21/2016 2:51 PM  Chief Complaint:  Medication management and follow-up.            History of Present Illness: Melvin came for his followup appointment with his wife.   He is fairly stable on his current psychiatric medication.  He denies any irritability, anger, mood swing or any feeling of hopelessness or worthlessness.  His wife endorsed that his mood is much stable.  He sleeping good.  He is social active but does not want to stay a lot outside.  He has no side effects including any tremors or shakes.  He has no EPS.  His thinking is clear and he denies any paranoia or any delusions.  Patient denies drinking alcohol or using any illegal substances.  His appetite is okay.  His vitals are stable.  Lives with his wife who is very supportive.    Suicidal Ideation: No Plan Formed: No Patient has means to carry out plan: No  Homicidal Ideation: No Plan Formed: No Patient has means to carry out plan: No  Review of Systems  Musculoskeletal: Positive for back pain.  Skin: Negative for itching and rash.  Neurological: Negative for dizziness and tremors.  Psychiatric/Behavioral: Negative.     Psychiatric: Agitation: No Hallucination: No Depressed Mood: No Insomnia: No Hypersomnia: No Altered Concentration: No Feels Worthless: No Grandiose Ideas: No Belief In Special Powers: No New/Increased Substance Abuse: No Compulsions: No  Neurologic: Headache: No Seizure: No Paresthesias: No  Medical History; Patient has history of arrhythmia, pulmonary embolism and DVT, nephrolithiasis , benign prostate hypertrophy, history of chronic kidney disease , anemia and hyperlipidemia.  He has a pacemaker .  He has history of lithium toxicity.  His primary care physician is Dr. Corky Sox  No results found for this or any previous visit (from the past 2160 hour(s)).  Outpatient Encounter Prescriptions as of 03/21/2016   Medication Sig  . ALPRAZolam (XANAX) 0.5 MG tablet Take 1 tablet (0.5 mg total) by mouth 3 (three) times daily.  . Ascorbic Acid (VITAMIN C) 100 MG tablet Take 100 mg by mouth daily.  . cholecalciferol (VITAMIN D) 1000 units tablet Take 1,000 Units by mouth daily.  . clopidogrel (PLAVIX) 75 MG tablet Take 1 tablet (75 mg total) by mouth every morning.  . ferrous fumarate (HEMOCYTE - 106 MG FE) 325 (106 Fe) MG TABS tablet Take 1 tablet by mouth.  . lamoTRIgine (LAMICTAL) 200 MG tablet Take 1 tablet (200 mg total) by mouth daily.  . metoprolol succinate (TOPROL-XL) 25 MG 24 hr tablet Take 1 tablet (25 mg total) by mouth every morning.  Marland Kitchen OLANZapine (ZYPREXA) 20 MG tablet TAKE 1 TABLET BY MOUTH ATBEDTIME.  . simvastatin (ZOCOR) 20 MG tablet Take 1 tablet (20 mg total) by mouth at bedtime.  . tamsulosin (FLOMAX) 0.4 MG CAPS capsule Take 1 capsule (0.4 mg total) by mouth every morning.  . traZODone (DESYREL) 50 MG tablet Take 1 tablet (50 mg total) by mouth at bedtime.  . [DISCONTINUED] ALPRAZolam (XANAX) 0.5 MG tablet Take 1 tablet (0.5 mg total) by mouth 3 (three) times daily.  . [DISCONTINUED] lamoTRIgine (LAMICTAL) 200 MG tablet Take 1 tablet (200 mg total) by mouth daily.  . [DISCONTINUED] OLANZapine (ZYPREXA) 20 MG tablet TAKE 1 TABLET BY MOUTH ATBEDTIME.  . [DISCONTINUED] traZODone (DESYREL) 50 MG tablet Take 1 tablet (50 mg total) by mouth at bedtime.   No facility-administered encounter medications on file  as of 03/21/2016.    Past Psychiatric History/Hospitalization(s) P:atient has bipolar disorder.  He had one psychiatric admission in the past due to mania at behavioral [facility].  In 2015 he was admitted on the medical floor because of lithium toxicity  and then transferred to [facility] because of worsening of the symptoms.  In the past he has taken Abilify, Risperdal, Lexapro, Wellbutrin and Navane. Patient has history of previous suicidal attempt, psychosis, mania and  hospitalization. Anxiety: Yes Bipolar Disorder: Yes Depression: Yes Mania: Yes Psychosis: Yes Schizophrenia: No Personality Disorder: No Hospitalization for psychiatric illness:Yes  Physical Exam: Constitutional:  BP 132/80 mmHg  Pulse 85  Ht 5\' 9"  (1.753 m)  Wt 190 lb 3.2 oz (86.274 kg)  BMI 28.07 kg/m2  General Appearance: well nourished and Casually dressed.  He maintained fair eye contact.  Musculoskeletal: Strength & Muscle Tone: within normal limits Gait & Station: normal Patient leans: N/A   Mental Status Examination; Patient is casually dressed and groomed.  He described his mood euthymic and his affect is appropriate.  He maintained fair eye contact.  His speech is  slow but clear and coherent.  He is cooperative and pleasant.  His thought process slow but logical.  His attention and concentration is fair.  He denies any paranoia, hallucination or any obsessive thoughts.  He denies any auditory or visual hallucination.  His psychomotor activity is normal. There were no delusions at this time.  He is alert and oriented x3 however he has difficulty remembering things.  His fund of knowledge is adequate.  His insight judgment and impulse control is okay.   Established Problem, Stable/Improving (1), Review or order clinical lab tests (1), Review of Last Therapy Session (1) and Review of Medication Regimen & Side Effects (2)  Assessment: Axis I: Bipolar disorder NOS  Axis II: Deferred  Axis III: See medical history  Plan:  Patient is stable on his current psychiatric medication.  He has no side effects.  I will continue Xanax 0.5 mg 3 times a day , Lamictal 200 mg daily, olanzapine 20 mg at bedtime and trazodone 50 mg at bedtime.  He has no rash or itching.  Recommended to call us back if he has any question or any concern.  Follow-up in 3 months.   Hurshel Bouillon T., MD 03/21/2016

## 2016-04-10 DIAGNOSIS — I442 Atrioventricular block, complete: Secondary | ICD-10-CM | POA: Diagnosis not present

## 2016-04-10 DIAGNOSIS — Z45018 Encounter for adjustment and management of other part of cardiac pacemaker: Secondary | ICD-10-CM | POA: Diagnosis not present

## 2016-05-05 ENCOUNTER — Encounter: Payer: Self-pay | Admitting: Family Medicine

## 2016-05-05 ENCOUNTER — Ambulatory Visit (INDEPENDENT_AMBULATORY_CARE_PROVIDER_SITE_OTHER): Payer: Medicare Other | Admitting: Family Medicine

## 2016-05-05 ENCOUNTER — Other Ambulatory Visit: Payer: Self-pay

## 2016-05-05 VITALS — HR 87 | Temp 98.0°F | Resp 16 | Wt 189.4 lb

## 2016-05-05 DIAGNOSIS — N184 Chronic kidney disease, stage 4 (severe): Secondary | ICD-10-CM | POA: Diagnosis not present

## 2016-05-05 DIAGNOSIS — R739 Hyperglycemia, unspecified: Secondary | ICD-10-CM | POA: Diagnosis not present

## 2016-05-05 DIAGNOSIS — I10 Essential (primary) hypertension: Secondary | ICD-10-CM

## 2016-05-05 DIAGNOSIS — E785 Hyperlipidemia, unspecified: Secondary | ICD-10-CM

## 2016-05-05 DIAGNOSIS — F319 Bipolar disorder, unspecified: Secondary | ICD-10-CM | POA: Diagnosis not present

## 2016-05-05 DIAGNOSIS — I2510 Atherosclerotic heart disease of native coronary artery without angina pectoris: Secondary | ICD-10-CM | POA: Diagnosis not present

## 2016-05-05 DIAGNOSIS — I5022 Chronic systolic (congestive) heart failure: Secondary | ICD-10-CM | POA: Diagnosis not present

## 2016-05-05 MED ORDER — METOPROLOL SUCCINATE ER 25 MG PO TB24: 25.0000 mg | ORAL_TABLET | Freq: Every morning | ORAL | Status: DC

## 2016-05-05 MED ORDER — TAMSULOSIN HCL 0.4 MG PO CAPS: 0.4000 mg | ORAL_CAPSULE | Freq: Every morning | ORAL | Status: DC

## 2016-05-05 MED ORDER — SIMVASTATIN 20 MG PO TABS: 20.0000 mg | ORAL_TABLET | Freq: Every day | ORAL | Status: DC

## 2016-05-05 MED ORDER — CLOPIDOGREL BISULFATE 75 MG PO TABS: 75.0000 mg | ORAL_TABLET | Freq: Every morning | ORAL | Status: DC

## 2016-05-05 NOTE — Progress Notes (Signed)
Name: Jeffrey Herring   MRN: [id number]    DOB: [birth year]   Date:05/05/2016       Progress Note  Subjective  Chief Complaint  Chief Complaint  Patient presents with  . Medication Refill    HPI  CHF: he states he has some orthopnea, but no chest pain or palpitation. He has a pacemaker since 1983, had some vegetation on pacemaker leads back in 2009 and is seeing Dr. Georg Ruddle at [facility] Cardiologist He has CAD and is on Plavix and Metoprolol , he is allergic to aspirin  CKI: reviewed labs his GFR is below 29. Explained the importance of rechecking labs and following up with Nephrologist at [facility] He has not been back since episode of acute renal failure during a hospital stay.   Bipolar disorder I: he had severe side effects with Lithium and he is doing well on Zyprexa. No recent episodes of mania, he was disabled since age 68 .  Anemia of chronic disease: last labs were normal, he is taking ferrous sulfate and advised him to stop taking it  BPH: he has urinary frequency, taking Flomax to control symptoms.   HTN: taking medication, no chest pain or palpitation  Hyperlipidemia: taking simvastatin and denies myalgias.   Patient Active Problem List   Diagnosis Date Noted  . BPH (benign prostatic hyperplasia) 11/04/2015  . Anemia in chronic illness 10/28/2015  . Chronic kidney disease (CKD), stage III (moderate) 10/28/2015  . Chronic systolic heart failure ([place]) 10/28/2015  . HLD (hyperlipidemia) 10/28/2015  . H/O deep venous thrombosis 10/28/2015  . Stage III chronic kidney disease 04/29/2014  . Pneumonia 04/09/2014  . History of sepsis 04/09/2014  . Inguinal hernia 01/06/2014  . Encounter for adjustment and management of other part of cardiac pacemaker 01/05/2014  . Complete atrioventricular block ([place]) 10/14/2013  . Sinoatrial node dysfunction ([place]) 10/14/2013  . Kidney cysts 05/06/2013  . Hypertension, benign 10/01/2012  . Healed or old pulmonary embolism 09/30/2012  .  Artificial cardiac pacemaker 09/30/2012  . Bipolar 1 disorder ([place]) 05/01/2012  . Arteriosclerosis of coronary artery 06/16/2009    Past Surgical History  Procedure Laterality Date  . Cardiac surgery    . Spine surgery    . Nose surgery    . Pacemaker placement    . Hernia repair Right 02/05/14    Family History  Problem Relation Age of Onset  . Depression Mother     Social History   Social History  . Marital Status: Married    Spouse Name: N/A  . Number of Children: N/A  . Years of Education: N/A   Occupational History  . Not on file.   Social History Main Topics  . Smoking status: Current Every Day Smoker -- 0.10 packs/day    Types: Cigars, Cigarettes  . Smokeless tobacco: Never Used  . Alcohol Use: No  . Drug Use: No  . Sexual Activity: No   Other Topics Concern  . Not on file   Social History Narrative   ** Merged History Encounter **         Current outpatient prescriptions:  .  ALPRAZolam (XANAX) 0.5 MG tablet, Take 1 tablet (0.5 mg total) by mouth 3 (three) times daily., Disp: 90 tablet, Rfl: 2 .  Ascorbic Acid (VITAMIN C) 100 MG tablet, Take 100 mg by mouth daily., Disp: , Rfl:  .  cholecalciferol (VITAMIN D) 1000 units tablet, Take 1,000 Units by mouth daily., Disp: , Rfl:  .  clopidogrel (PLAVIX) 75  MG tablet, Take 1 tablet (75 mg total) by mouth every morning., Disp: 90 tablet, Rfl: 1 .  ferrous fumarate (HEMOCYTE - 106 MG FE) 325 (106 Fe) MG TABS tablet, Take 1 tablet by mouth., Disp: , Rfl:  .  lamoTRIgine (LAMICTAL) 200 MG tablet, Take 1 tablet (200 mg total) by mouth daily., Disp: 90 tablet, Rfl: 0 .  metoprolol succinate (TOPROL-XL) 25 MG 24 hr tablet, Take 1 tablet (25 mg total) by mouth every morning., Disp: 90 tablet, Rfl: 1 .  OLANZapine (ZYPREXA) 20 MG tablet, TAKE 1 TABLET BY MOUTH ATBEDTIME., Disp: 90 tablet, Rfl: 0 .  simvastatin (ZOCOR) 20 MG tablet, Take 1 tablet (20 mg total) by mouth at bedtime., Disp: 90 tablet, Rfl: 1 .  tamsulosin  (FLOMAX) 0.4 MG CAPS capsule, Take 1 capsule (0.4 mg total) by mouth every morning., Disp: 90 capsule, Rfl: 1 .  traZODone (DESYREL) 50 MG tablet, Take 1 tablet (50 mg total) by mouth at bedtime., Disp: 90 tablet, Rfl: 0  Allergies  Allergen Reactions  . Aspirin Hives, Shortness Of Breath and Palpitations  . Codeine Nausea And Vomiting  . Demerol [Meperidine] Nausea And Vomiting  . Lithium      acute renal failure     ROS  Constitutional: Negative for fever or weight change.  Respiratory: Negative for cough and shortness of breath.   Cardiovascular: Negative for chest pain or palpitations.  Gastrointestinal: Negative for abdominal pain, no bowel changes.  Musculoskeletal: Negative for gait problem or joint swelling.  Skin: Negative for rash.  Neurological: Negative for dizziness or headache.  No other specific complaints in a complete review of systems (except as listed in HPI above).  Objective  Filed Vitals:   05/05/16 1342  Pulse: 87  Temp: 98 F (36.7 C)  TempSrc: Oral  Resp: 16  Weight: 189 lb 6.4 oz (85.911 kg)  SpO2: 93%    Body mass index is 27.96 kg/(m^2).  Physical Exam  Constitutional: Patient appears well-developed and well-nourished. Obese  No distress.  HEENT: head atraumatic, normocephalic, pupils equal and reactive to light,neck supple, throat within normal limits Cardiovascular: Normal rate, regular rhythm and normal heart sounds, S3.  No murmur heard. Trace BLE edema. Pulmonary/Chest: Effort normal and breath sounds normal. No respiratory distress. Abdominal: Soft.  There is no tenderness. Psychiatric: Patient has a normal mood and affect. behavior is normal. Judgment and thought content normal.  PHQ2/9: Depression screen [facility] 2/9 05/05/2016 10/28/2015  Decreased Interest 0 0  Down, Depressed, Hopeless 0 0  PHQ - 2 Score 0 0     Fall Risk: Fall Risk  05/05/2016 10/28/2015  Falls in the past year? No No      Functional Status Survey: Is the  patient deaf or have difficulty hearing?: No Does the patient have difficulty seeing, even when wearing glasses/contacts?: No (patient wears reading glasses) Does the patient have difficulty concentrating, remembering, or making decisions?: No Does the patient have difficulty walking or climbing stairs?: No Does the patient have difficulty dressing or bathing?: No Does the patient have difficulty doing errands alone such as visiting a doctor's office or shopping?: No    Assessment & Plan  1. ASCVD (arteriosclerotic cardiovascular disease)  Taking Plavix and Metoprolol   2. HLD (hyperlipidemia)  - Lipid panel  3. Essential hypertension  - CBC with Differential/Platelet  4. Chronic systolic CHF (congestive heart failure) ([place])  Advised follow up with cardiologist   5. Bipolar 1 disorder (HCC)  Continue medication, doing well  6. Chronic kidney disease, stage 4 (severe) (HCC)  - CBC with Differential/Platelet - COMPLETE METABOLIC PANEL WITH GFR - VITAMIN D 25 Hydroxy (Vit-D Deficiency, Fractures) - Parathyroid hormone, intact (no Ca)  7. Hyperglycemia  - Hemoglobin A1c

## 2016-05-05 NOTE — Telephone Encounter (Signed)
I just got a fax from [facility] requesting a refill of this patient's medications. Patient stated they have been trying to get these medications since June. I informed her that they were probably sending it to Dr. Walker Kehr box so we did not get anything until the fax came through today.  Refill request was sent to Dr. Steele Sizer for approval and submission.

## 2016-06-01 ENCOUNTER — Other Ambulatory Visit (HOSPITAL_COMMUNITY): Payer: Self-pay | Admitting: Psychiatry

## 2016-06-01 DIAGNOSIS — F319 Bipolar disorder, unspecified: Secondary | ICD-10-CM

## 2016-06-20 ENCOUNTER — Other Ambulatory Visit (HOSPITAL_COMMUNITY): Payer: Self-pay

## 2016-06-20 DIAGNOSIS — F319 Bipolar disorder, unspecified: Secondary | ICD-10-CM

## 2016-06-20 MED ORDER — ALPRAZOLAM 0.5 MG PO TABS: 0.5000 mg | ORAL_TABLET | Freq: Three times a day (TID) | ORAL | 2 refills | Status: DC

## 2016-06-20 MED ORDER — OLANZAPINE 20 MG PO TABS
ORAL_TABLET | ORAL | 0 refills | Status: DC
Start: 2016-06-20 — End: 2016-08-23

## 2016-06-20 MED ORDER — LAMOTRIGINE 200 MG PO TABS: 200.0000 mg | ORAL_TABLET | Freq: Every day | ORAL | 0 refills | Status: DC

## 2016-06-20 MED ORDER — TRAZODONE HCL 50 MG PO TABS: 50.0000 mg | ORAL_TABLET | Freq: Every day | ORAL | 0 refills | Status: DC

## 2016-06-20 NOTE — Telephone Encounter (Signed)
Telephone message left at [facility] in [place] to call in patient's approved Alprazolam order, 0.5mg , #90 with 2 refills per Dr. Lovena Le and informed of e-scribed new orders for patient's Zyprexa, Trazodone and Lamictal this date, each for 90 days per Dr. Lovena Le authorization.  Called patient's wife back to inform orders were sent and called into [facility]'s pharmacy line and patient to call back if any problems getting medication filled.  Patient to keep appointment now set for 08/02/16.

## 2016-06-20 NOTE — Telephone Encounter (Signed)
Medication refill requests - Patient's wife called stating her husbands appt. for 06/21/16 was rescheduled for 08/02/16 due to Dr. Adele Schilder out. Requests all new refills for patient for 90 day orders. Patient's wife requested 90 day orders for patient's Zyprexa, Trazodone and  Lamictal and new 30 day orders for his Alprazolam.  Patient was rescheduled from 06/21/16 and last seen 03/21/16.  Agreed to call collateral back once orders complete if approved. Requests Alprazolam be called into [facility] so they do not have to come from [place] to pick up a new prescription and for other orders to be sent there as well.

## 2016-06-21 ENCOUNTER — Ambulatory Visit (HOSPITAL_COMMUNITY): Payer: Self-pay | Admitting: Psychiatry

## 2016-07-13 DIAGNOSIS — Z45018 Encounter for adjustment and management of other part of cardiac pacemaker: Secondary | ICD-10-CM | POA: Diagnosis not present

## 2016-07-13 DIAGNOSIS — I442 Atrioventricular block, complete: Secondary | ICD-10-CM | POA: Diagnosis not present

## 2016-08-01 ENCOUNTER — Telehealth: Payer: Self-pay | Admitting: Family Medicine

## 2016-08-01 NOTE — Telephone Encounter (Signed)
Printed lab order an placed in the file cabinet

## 2016-08-01 NOTE — Telephone Encounter (Signed)
Patient has a lab order from July that he has not done. He is wanting to come in on this Friday to get them done. Not sure if I am able to print them out or if you have to change the date on them since its been over 30 days.

## 2016-08-02 ENCOUNTER — Ambulatory Visit (HOSPITAL_COMMUNITY): Payer: Self-pay | Admitting: Psychiatry

## 2016-08-04 ENCOUNTER — Other Ambulatory Visit: Payer: Self-pay | Admitting: Family Medicine

## 2016-08-04 DIAGNOSIS — E785 Hyperlipidemia, unspecified: Secondary | ICD-10-CM | POA: Diagnosis not present

## 2016-08-04 DIAGNOSIS — R739 Hyperglycemia, unspecified: Secondary | ICD-10-CM | POA: Diagnosis not present

## 2016-08-04 DIAGNOSIS — I10 Essential (primary) hypertension: Secondary | ICD-10-CM | POA: Diagnosis not present

## 2016-08-04 DIAGNOSIS — N184 Chronic kidney disease, stage 4 (severe): Secondary | ICD-10-CM | POA: Diagnosis not present

## 2016-08-04 LAB — COMPLETE METABOLIC PANEL WITH GFR
ALT: 9 U/L (ref 9–46)
AST: 14 U/L (ref 10–35)
Albumin: 4.4 g/dL (ref 3.6–5.1)
Alkaline Phosphatase: 68 U/L (ref 40–115)
BUN: 28 mg/dL — ABNORMAL HIGH (ref 7–25)
CO2: 21 mmol/L (ref 20–31)
Calcium: 9.6 mg/dL (ref 8.6–10.3)
Chloride: 108 mmol/L (ref 98–110)
Creat: 2.42 mg/dL — ABNORMAL HIGH (ref 0.70–1.25)
GFR, Est African American: 31 mL/min — ABNORMAL LOW (ref >=60)
GFR, Est Non African American: 27 mL/min — ABNORMAL LOW (ref >=60)
Glucose, Bld: 93 mg/dL (ref 65–99)
Potassium: 4.2 mmol/L (ref 3.5–5.3)
Sodium: 142 mmol/L (ref 135–146)
Total Bilirubin: 0.5 mg/dL (ref 0.2–1.2)
Total Protein: 6.9 g/dL (ref 6.1–8.1)

## 2016-08-04 LAB — CBC WITH DIFFERENTIAL/PLATELET
Basophils Absolute: 0 cells/uL (ref 0–200)
Basophils Relative: 0 %
Eosinophils Absolute: 210 cells/uL (ref 15–500)
Eosinophils Relative: 3 %
HCT: 45.2 % (ref 38.5–50.0)
Hemoglobin: 15.5 g/dL (ref 13.2–17.1)
Lymphocytes Relative: 14 %
Lymphs Abs: 980 cells/uL (ref 850–3900)
MCH: 31.8 pg (ref 27.0–33.0)
MCHC: 34.3 g/dL (ref 32.0–36.0)
MCV: 92.6 fL (ref 80.0–100.0)
MPV: 10.7 fL (ref 7.5–12.5)
Monocytes Absolute: 910 cells/uL (ref 200–950)
Monocytes Relative: 13 %
Neutro Abs: 4900 cells/uL (ref 1500–7800)
Neutrophils Relative %: 70 %
Platelets: 140 10*3/uL (ref 140–400)
RBC: 4.88 MIL/uL (ref 4.20–5.80)
RDW: 13.6 % (ref 11.0–15.0)
WBC: 7 10*3/uL (ref 3.8–10.8)

## 2016-08-04 LAB — LIPID PANEL
Cholesterol: 174 mg/dL (ref 125–200)
HDL: 38 mg/dL — ABNORMAL LOW (ref >=40)
LDL Cholesterol: 103 mg/dL (ref <130)
Total CHOL/HDL Ratio: 4.6 Ratio (ref <=5.0)
Triglycerides: 167 mg/dL — ABNORMAL HIGH (ref <150)
VLDL: 33 mg/dL — ABNORMAL HIGH (ref <30)

## 2016-08-05 LAB — HEMOGLOBIN A1C
Hgb A1c MFr Bld: 5.2 % (ref <5.7)
Mean Plasma Glucose: 103 mg/dL

## 2016-08-05 LAB — VITAMIN D 25 HYDROXY (VIT D DEFICIENCY, FRACTURES): Vit D, 25-Hydroxy: 48 ng/mL (ref 30–100)

## 2016-08-07 LAB — PARATHYROID HORMONE, INTACT (NO CA): PTH: 44 pg/mL (ref 14–64)

## 2016-08-08 ENCOUNTER — Ambulatory Visit: Payer: Medicare Other | Admitting: Family Medicine

## 2016-08-23 ENCOUNTER — Encounter (HOSPITAL_COMMUNITY): Payer: Self-pay | Admitting: Psychiatry

## 2016-08-23 ENCOUNTER — Ambulatory Visit (INDEPENDENT_AMBULATORY_CARE_PROVIDER_SITE_OTHER): Payer: Medicare Other | Admitting: Psychiatry

## 2016-08-23 DIAGNOSIS — Z79899 Other long term (current) drug therapy: Secondary | ICD-10-CM

## 2016-08-23 DIAGNOSIS — F319 Bipolar disorder, unspecified: Secondary | ICD-10-CM | POA: Diagnosis not present

## 2016-08-23 DIAGNOSIS — I2510 Atherosclerotic heart disease of native coronary artery without angina pectoris: Secondary | ICD-10-CM

## 2016-08-23 MED ORDER — LAMOTRIGINE 200 MG PO TABS: 200.0000 mg | ORAL_TABLET | Freq: Every day | ORAL | 0 refills | Status: DC

## 2016-08-23 MED ORDER — TRAZODONE HCL 50 MG PO TABS: 50.0000 mg | ORAL_TABLET | Freq: Every day | ORAL | 0 refills | Status: DC

## 2016-08-23 MED ORDER — ALPRAZOLAM 0.5 MG PO TABS: 0.5000 mg | ORAL_TABLET | Freq: Three times a day (TID) | ORAL | 2 refills | Status: DC

## 2016-08-23 MED ORDER — OLANZAPINE 20 MG PO TABS: ORAL_TABLET | ORAL | 0 refills | Status: DC

## 2016-08-23 NOTE — Progress Notes (Signed)
[place] [phone] Progress Note  Jeffrey Herring [id number] 65 y.o.  08/23/2016 2:03 PM  Chief Complaint:  Medication management and follow-up.            History of Present Illness: Jeffrey Herring came for his followup appointment with his wife.   He is taking his medication as prescribed.  Last month he had blood work and his creatinine slightly increase from his past results.  He is scheduled to see his nephrologist in few weeks.  Overall he described his mood is stable.  He denies any irritability, anger, mania, psychosis or any hallucination.  He sleeping good and denies any side effects including in tremors or shakes.  His thinking is clear, logical and his thought process is coherent.  His wife endorse much improvement as compared to last year in his thinking and behavior.  She wants to continue current psychiatric medication which is Xanax, Lamictal, olanzapine and trazodone.  He has no rash, itching, headaches from Lamictal.  His appetite is okay.  His vital signs are stable.  He lives with his wife who is very supportive.  He is excited about Thanksgiving as his daughter and son coming to their house for anxiety and dinner.  Suicidal Ideation: No Plan Formed: No Patient has means to carry out plan: No  Homicidal Ideation: No Plan Formed: No Patient has means to carry out plan: No  Review of Systems  Constitutional: Negative.   HENT: Negative.   Eyes: Negative.   Respiratory: Negative.   Cardiovascular: Negative.   Gastrointestinal: Negative.   Genitourinary: Negative.   Musculoskeletal: Positive for back pain.  Skin: Negative for itching and rash.  Neurological: Negative for dizziness and tremors.  Endo/Heme/Allergies: Negative.   Psychiatric/Behavioral: Negative.     Psychiatric: Agitation: No Hallucination: No Depressed Mood: No Insomnia: No Hypersomnia: No Altered Concentration: No Feels Worthless: No Grandiose Ideas: No Belief In Special Powers:  No New/Increased Substance Abuse: No Compulsions: No  Neurologic: Headache: No Seizure: No Paresthesias: No  Medical History; Patient has history of arrhythmia, pulmonary embolism and DVT, nephrolithiasis , benign prostate hypertrophy, history of chronic kidney disease , anemia and hyperlipidemia.  He has a pacemaker .  He has history of lithium toxicity.  His primary care physician is Dr. Corky Sox  Recent Results (from the past 2160 hour(s))  COMPLETE METABOLIC PANEL WITH GFR     Status: Abnormal   Collection Time: 08/04/16 11:56 AM  Result Value Ref Range   Sodium 142 135 - 146 mmol/L   Potassium 4.2 3.5 - 5.3 mmol/L   Chloride 108 98 - 110 mmol/L   CO2 21 20 - 31 mmol/L   Glucose, Bld 93 65 - 99 mg/dL   BUN 28 (H) 7 - 25 mg/dL   Creat 2.42 (H) 0.70 - 1.25 mg/dL    Comment:   For patients > or = 66 years of age: The upper reference limit for Creatinine is approximately 13% higher for people identified as African-American.      Total Bilirubin 0.5 0.2 - 1.2 mg/dL   Alkaline Phosphatase 68 40 - 115 U/L   AST 14 10 - 35 U/L   ALT 9 9 - 46 U/L   Total Protein 6.9 6.1 - 8.1 g/dL   Albumin 4.4 3.6 - 5.1 g/dL   Calcium 9.6 8.6 - 10.3 mg/dL   GFR, Est African American 31 (L) >=60 mL/min   GFR, Est Non African American 27 (L) >=60 mL/min  CBC with Differential/Platelet  Status: None   Collection Time: 08/04/16 11:56 AM  Result Value Ref Range   WBC 7.0 3.8 - 10.8 K/uL   RBC 4.88 4.20 - 5.80 MIL/uL   Hemoglobin 15.5 13.2 - 17.1 g/dL   HCT 45.2 38.5 - 50.0 %   MCV 92.6 80.0 - 100.0 fL   MCH 31.8 27.0 - 33.0 pg   MCHC 34.3 32.0 - 36.0 g/dL   RDW 13.6 11.0 - 15.0 %   Platelets 140 140 - 400 K/uL   MPV 10.7 7.5 - 12.5 fL   Neutro Abs 4,900 1,500 - 7,800 cells/uL   Lymphs Abs 980 850 - 3,900 cells/uL   Monocytes Absolute 910 200 - 950 cells/uL   Eosinophils Absolute 210 15 - 500 cells/uL   Basophils Absolute 0 0 - 200 cells/uL   Neutrophils Relative % 70 %   Lymphocytes  Relative 14 %   Monocytes Relative 13 %   Eosinophils Relative 3 %   Basophils Relative 0 %   Smear Review Criteria for review not met   Lipid panel     Status: Abnormal   Collection Time: 08/04/16 11:56 AM  Result Value Ref Range   Cholesterol 174 125 - 200 mg/dL   Triglycerides 167 (H) <150 mg/dL   HDL 38 (L) >=40 mg/dL   Total CHOL/HDL Ratio 4.6 <=5.0 Ratio   VLDL 33 (H) <30 mg/dL   LDL Cholesterol 103 <130 mg/dL    Comment:   Total Cholesterol/HDL Ratio:CHD Risk                        Coronary Heart Disease Risk Table                                        Men       Women          1/2 Average Risk              3.4        3.3              Average Risk              5.0        4.4           2X Average Risk              9.6        7.1           3X Average Risk             23.4       11.0 Use the calculated Patient Ratio above and the CHD Risk table  to determine the patient's CHD Risk.   Hemoglobin A1c     Status: None   Collection Time: 08/04/16 11:56 AM  Result Value Ref Range   Hgb A1c MFr Bld 5.2 <5.7 %    Comment:   For the purpose of screening for the presence of diabetes:   <5.7%       Consistent with the absence of diabetes 5.7-6.4 %   Consistent with increased risk for diabetes (prediabetes) >=6.5 %     Consistent with diabetes   This assay result is consistent with a decreased risk of diabetes.   Currently, no consensus exists regarding use of hemoglobin A1c for diagnosis of diabetes in children.  According to American Diabetes Association (ADA) guidelines, hemoglobin A1c <7.0% represents optimal control in non-pregnant diabetic patients. Different metrics may apply to specific patient populations. Standards of Medical Care in Diabetes (ADA).      Mean Plasma Glucose 103 mg/dL  Parathyroid hormone, intact (no Ca)     Status: None   Collection Time: 08/04/16 11:56 AM  Result Value Ref Range   PTH 44 14 - 64 pg/mL    Comment:   Interpretive Guide:                               Intact PTH               Calcium                              ----------               ------- Normal Parathyroid           Normal                   Normal Hypoparathyroidism           Low or Low Normal        Low Hyperparathyroidism      Primary                 Normal or High           High      Secondary               High                     Normal or Low      Tertiary                High                     High Non-Parathyroid   Hypercalcemia              Low or Low Normal        High   VITAMIN D 25 Hydroxy (Vit-D Deficiency, Fractures)     Status: None   Collection Time: 08/04/16 11:56 AM  Result Value Ref Range   Vit D, 25-Hydroxy 48 30 - 100 ng/mL    Comment: Vitamin D Status           25-OH Vitamin D        Deficiency                <20 ng/mL        Insufficiency         20 - 29 ng/mL        Optimal             > or = 30 ng/mL   For 25-OH Vitamin D testing on patients on D2-supplementation and patients for whom quantitation of D2 and D3 fractions is required, the QuestAssureD 25-OH VIT D, (D2,D3), LC/MS/MS is recommended: order code [phone] (patients > 2 yrs).     Outpatient Encounter Prescriptions as of 08/23/2016  Medication Sig Dispense Refill  . ALPRAZolam (XANAX) 0.5 MG tablet Take 1 tablet (0.5 mg total) by mouth 3 (three) times daily. 90 tablet 2  . Ascorbic Acid (VITAMIN C) 100 MG tablet Take 100 mg by mouth daily.    Marland Kitchen  cholecalciferol (VITAMIN D) 1000 units tablet Take 1,000 Units by mouth daily.    . clopidogrel (PLAVIX) 75 MG tablet Take 1 tablet (75 mg total) by mouth every morning. 90 tablet 1  . ferrous fumarate (HEMOCYTE - 106 MG FE) 325 (106 Fe) MG TABS tablet Take 1 tablet by mouth.    . lamoTRIgine (LAMICTAL) 200 MG tablet Take 1 tablet (200 mg total) by mouth daily. 90 tablet 0  . metoprolol succinate (TOPROL-XL) 25 MG 24 hr tablet Take 1 tablet (25 mg total) by mouth every morning. 90 tablet 1  . OLANZapine (ZYPREXA) 20 MG tablet  TAKE 1 TABLET BY MOUTH ATBEDTIME. 90 tablet 0  . simvastatin (ZOCOR) 20 MG tablet Take 1 tablet (20 mg total) by mouth at bedtime. 90 tablet 1  . tamsulosin (FLOMAX) 0.4 MG CAPS capsule Take 1 capsule (0.4 mg total) by mouth every morning. 90 capsule 1  . traZODone (DESYREL) 50 MG tablet Take 1 tablet (50 mg total) by mouth at bedtime. 90 tablet 0  . [DISCONTINUED] ALPRAZolam (XANAX) 0.5 MG tablet Take 1 tablet (0.5 mg total) by mouth 3 (three) times daily. 90 tablet 2  . [DISCONTINUED] lamoTRIgine (LAMICTAL) 200 MG tablet Take 1 tablet (200 mg total) by mouth daily. 90 tablet 0  . [DISCONTINUED] OLANZapine (ZYPREXA) 20 MG tablet TAKE 1 TABLET BY MOUTH ATBEDTIME. 90 tablet 0  . [DISCONTINUED] traZODone (DESYREL) 50 MG tablet Take 1 tablet (50 mg total) by mouth at bedtime. 90 tablet 0   No facility-administered encounter medications on file as of 08/23/2016.     Past Psychiatric History/Hospitalization(s) P:atient has bipolar disorder.  He had one psychiatric admission in the past due to mania at behavioral [facility].  In 2015 he was admitted on the medical floor because of lithium toxicity  and then transferred to [facility] because of worsening of the symptoms.  In the past he has taken Abilify, Risperdal, Lexapro, Wellbutrin and Navane. Patient has history of previous suicidal attempt, psychosis, mania and hospitalization. Anxiety: Yes Bipolar Disorder: Yes Depression: Yes Mania: Yes Psychosis: Yes Schizophrenia: No Personality Disorder: No Hospitalization for psychiatric illness:Yes  Physical Exam: Constitutional:  BP 128/76   Pulse 83   Ht '5\' 9"'  (1.753 m)   Wt 192 lb (87.1 kg)   BMI 28.35 kg/m   General Appearance: well nourished and Casually dressed.  He maintained fair eye contact.  Musculoskeletal: Strength & Muscle Tone: within normal limits Gait & Station: normal Patient leans: N/A   Mental Status Examination; Patient is casually dressed and groomed.   He is pleasant and cooperative and maintained good eye contact.  He described his mood euthymic and his affect is appropriate.  His speech is  slow but clear and coherent. His thought process slow but logical.  His attention and concentration is fair.  He denies any paranoia, hallucination or any obsessive thoughts.  He denies any auditory or visual hallucination.  His psychomotor activity is normal.  His attention and concentration is okay.  There were no delusions at this time.  He is alert and oriented x3 however he has difficulty remembering things.  His fund of knowledge is adequate.  His insight judgment and impulse control is okay.   Established Problem, Stable/Improving (1), Review or order clinical lab tests (1), Review of Last Therapy Session (1) and Review of Medication Regimen & Side Effects (2)  Assessment: Axis I: Bipolar disorder NOS  Axis II: Deferred  Axis III: See medical history  Plan:  Patient is stable on his current psychiatric medication.  I review his blood work results.  He is scheduled to see his nephrologist as his creatinine is slightly increase from the past.  He like to continue his current psychiatric medication.  He has no side effects.  I will continue Xanax 0.5 mg 3 times a day , Lamictal 200 mg daily, olanzapine 20 mg at bedtime and trazodone 50 mg at bedtime.  He has no rash or itching.  He does not ask for early refills of benzodiazepine.  Discussed benzodiazepine tolerance, withdrawal and abuse.  Recommended to call us back if he has any question or any concern.  Follow-up in 3 months.   ARFEEN,SYED T., MD 08/23/2016

## 2016-09-04 DIAGNOSIS — Z6826 Body mass index (BMI) 26.0-26.9, adult: Secondary | ICD-10-CM | POA: Diagnosis not present

## 2016-09-04 DIAGNOSIS — N400 Enlarged prostate without lower urinary tract symptoms: Secondary | ICD-10-CM | POA: Diagnosis not present

## 2016-09-04 DIAGNOSIS — E785 Hyperlipidemia, unspecified: Secondary | ICD-10-CM | POA: Diagnosis not present

## 2016-09-20 ENCOUNTER — Telehealth: Payer: Self-pay | Admitting: Family Medicine

## 2016-09-20 ENCOUNTER — Encounter: Payer: Self-pay | Admitting: Family Medicine

## 2016-09-20 ENCOUNTER — Ambulatory Visit (INDEPENDENT_AMBULATORY_CARE_PROVIDER_SITE_OTHER): Payer: Medicare Other | Admitting: Family Medicine

## 2016-09-20 VITALS — BP 114/68 | HR 92 | Temp 97.8°F | Resp 16 | Ht 69.0 in | Wt 190.2 lb

## 2016-09-20 DIAGNOSIS — I442 Atrioventricular block, complete: Secondary | ICD-10-CM

## 2016-09-20 DIAGNOSIS — I10 Essential (primary) hypertension: Secondary | ICD-10-CM

## 2016-09-20 DIAGNOSIS — I5022 Chronic systolic (congestive) heart failure: Secondary | ICD-10-CM

## 2016-09-20 DIAGNOSIS — I2510 Atherosclerotic heart disease of native coronary artery without angina pectoris: Secondary | ICD-10-CM | POA: Diagnosis not present

## 2016-09-20 DIAGNOSIS — E782 Mixed hyperlipidemia: Secondary | ICD-10-CM

## 2016-09-20 DIAGNOSIS — N400 Enlarged prostate without lower urinary tract symptoms: Secondary | ICD-10-CM | POA: Diagnosis not present

## 2016-09-20 DIAGNOSIS — N183 Chronic kidney disease, stage 3 unspecified: Secondary | ICD-10-CM

## 2016-09-20 DIAGNOSIS — Z23 Encounter for immunization: Secondary | ICD-10-CM | POA: Diagnosis not present

## 2016-09-20 DIAGNOSIS — I495 Sick sinus syndrome: Secondary | ICD-10-CM | POA: Diagnosis not present

## 2016-09-20 DIAGNOSIS — F319 Bipolar disorder, unspecified: Secondary | ICD-10-CM | POA: Diagnosis not present

## 2016-09-20 MED ORDER — METOPROLOL SUCCINATE ER 25 MG PO TB24: 12.5000 mg | ORAL_TABLET | Freq: Every morning | ORAL | 1 refills | Status: DC

## 2016-09-20 MED ORDER — CLOPIDOGREL BISULFATE 75 MG PO TABS: 75.0000 mg | ORAL_TABLET | Freq: Every morning | ORAL | 1 refills | Status: DC

## 2016-09-20 MED ORDER — TAMSULOSIN HCL 0.4 MG PO CAPS: 0.4000 mg | ORAL_CAPSULE | Freq: Every morning | ORAL | 1 refills | Status: DC

## 2016-09-20 MED ORDER — METOPROLOL SUCCINATE ER 25 MG PO TB24: 25.0000 mg | ORAL_TABLET | Freq: Every morning | ORAL | 1 refills | Status: DC

## 2016-09-20 MED ORDER — SIMVASTATIN 20 MG PO TABS: 20.0000 mg | ORAL_TABLET | Freq: Every day | ORAL | 1 refills | Status: DC

## 2016-09-20 NOTE — Progress Notes (Signed)
Name: Jeffrey Herring   MRN: [id number]    DOB: 1951-02-08   Date:09/21/2016       Progress Note  Subjective  Chief Complaint  Chief Complaint  Patient presents with  . Flu Vaccine  . Hypertension    pt not checking B/P outside of office but not having any issues  . Hyperlipidemia    HPI  CHF: he states he has some orthopnea occasionally, but no chest pain or palpitation. He has a pacemaker since 1983, had some vegetation on pacemaker leads back in 2009 and is seeing Dr. Georg Ruddle at [facility] He has one vessel  CAD and is on Plavix and Metoprolol , he is allergic to aspirin. Looking on Dr. Otilio Jefferson records I did not see metoprolol listed and we will contact his office.   CKI: reviewed labs his GFR was 29  ( stage IV) but improved to stage III ( GFR of 31 ). He has been back to see Dr. Johnney Ou - nephrologist.   Bipolar disorder I: he had severe side effects with Lithium and he is doing well on Zyprexa. No recent episodes of mania, he was disabled since age 52 . His hgbA1C was normal in 07/2016  Anemia of chronic disease: last labs were normal, he stopped taking iron and is doing well   BPH: he has urinary frequency, taking Flomax to control symptoms. He has nocturia only once per night, but used to be more frequent  HTN: taking medication, no chest pain or palpitation  Hyperlipidemia: taking simvastatin and denies myalgias.    Patient Active Problem List   Diagnosis Date Noted  . BPH (benign prostatic hyperplasia) 11/04/2015  . Chronic kidney disease (CKD), stage III (moderate) 10/28/2015  . Chronic systolic heart failure ([place]) 10/28/2015  . HLD (hyperlipidemia) 10/28/2015  . H/O deep venous thrombosis 10/28/2015  . History of sepsis 04/09/2014  . Inguinal hernia 01/06/2014  . Complete atrioventricular block ([place]) 10/14/2013  . Sinoatrial node dysfunction ([place]) 10/14/2013  . Kidney cysts 05/06/2013  . Hypertension, benign 10/01/2012  . Healed or old  pulmonary embolism 09/30/2012  . Artificial cardiac pacemaker 09/30/2012  . Bipolar 1 disorder ([place]) 05/01/2012  . Arteriosclerosis of coronary artery 06/16/2009    Past Surgical History:  Procedure Laterality Date  . CARDIAC SURGERY    . HERNIA REPAIR Right 02/05/14  . NOSE SURGERY    . PACEMAKER PLACEMENT    . SPINE SURGERY      Family History  Problem Relation Age of Onset  . Depression Mother     Social History   Social History  . Marital status: Married    Spouse name: N/A  . Number of children: N/A  . Years of education: N/A   Occupational History  . Not on file.   Social History Main Topics  . Smoking status: Current Every Day Smoker    Packs/day: 0.10    Types: Cigars, Cigarettes  . Smokeless tobacco: Never Used  . Alcohol use No  . Drug use: No  . Sexual activity: No   Other Topics Concern  . Not on file   Social History Narrative   ** Merged History Encounter **         Current Outpatient Prescriptions:  .  ALPRAZolam (XANAX) 0.5 MG tablet, Take 1 tablet (0.5 mg total) by mouth 3 (three) times daily., Disp: 90 tablet, Rfl: 2 .  Ascorbic Acid (VITAMIN C) 100 MG tablet, Take 100 mg by mouth daily., Disp: , Rfl:  .  cholecalciferol (VITAMIN D) 1000 units tablet, Take 1,000 Units by mouth daily., Disp: , Rfl:  .  clopidogrel (PLAVIX) 75 MG tablet, Take 1 tablet (75 mg total) by mouth every morning., Disp: 90 tablet, Rfl: 1 .  lamoTRIgine (LAMICTAL) 200 MG tablet, Take 1 tablet (200 mg total) by mouth daily., Disp: 90 tablet, Rfl: 0 .  metoprolol succinate (TOPROL-XL) 25 MG 24 hr tablet, Take 0.5 tablets (12.5 mg total) by mouth every morning., Disp: 45 tablet, Rfl: 1 .  OLANZapine (ZYPREXA) 20 MG tablet, TAKE 1 TABLET BY MOUTH ATBEDTIME., Disp: 90 tablet, Rfl: 0 .  simvastatin (ZOCOR) 20 MG tablet, Take 1 tablet (20 mg total) by mouth at bedtime., Disp: 90 tablet, Rfl: 1 .  tamsulosin (FLOMAX) 0.4 MG CAPS capsule, Take 1 capsule (0.4 mg total) by mouth  every morning., Disp: 90 capsule, Rfl: 1 .  traZODone (DESYREL) 50 MG tablet, Take 1 tablet (50 mg total) by mouth at bedtime., Disp: 90 tablet, Rfl: 0  Allergies  Allergen Reactions  . Aspirin Hives, Shortness Of Breath and Palpitations  . Codeine Nausea And Vomiting  . Demerol [Meperidine] Nausea And Vomiting  . Lithium      acute renal failure     ROS  Constitutional: Negative for fever or weight change.  Respiratory: Negative for cough and shortness of breath.   Cardiovascular: Negative for chest pain or palpitations.  Gastrointestinal: Negative for abdominal pain, no bowel changes.  Musculoskeletal: Negative for gait problem or joint swelling.  Skin: Negative for rash.  Neurological: Negative for dizziness (had one episode one week ago when he got up but resolved ) no headache.  No other specific complaints in a complete review of systems (except as listed in HPI above).  Objective   Vitals:   09/21/16 1310  BP: 114/68  Pulse: 92  Resp: 16  Temp: 97.8 F (36.6 C)  TempSrc: Oral  SpO2: 92%  Weight: 190 lb 4 oz (86.3 kg)  Height: 5\' 9"  (1.753 m)    Body mass index is 28.1 kg/m.  Physical Exam  Constitutional: Patient appears well-developed and well-nourished. Obese No distress.  HEENT: head atraumatic, normocephalic, pupils equal and reactive to light,  neck supple, throat within normal limits Cardiovascular: Normal rate, regular rhythm and normal heart sounds.  No murmur heard. No BLE edema. Pulmonary/Chest: Effort normal and breath sounds normal. No respiratory distress. Abdominal: Soft.  There is no tenderness. Psychiatric: Patient has a normal mood and affect. behavior is normal. Judgment and thought content normal.   Recent Results (from the past 2160 hour(s))  COMPLETE METABOLIC PANEL WITH GFR     Status: Abnormal   Collection Time: 08/04/16 11:56 AM  Result Value Ref Range   Sodium 142 135 - 146 mmol/L   Potassium 4.2 3.5 - 5.3 mmol/L   Chloride  108 98 - 110 mmol/L   CO2 21 20 - 31 mmol/L   Glucose, Bld 93 65 - 99 mg/dL   BUN 28 (H) 7 - 25 mg/dL   Creat 08/06/16 (H) 0.03 - 1.25 mg/dL    Comment:   For patients > or = 66 years of age: The upper reference limit for Creatinine is approximately 13% higher for people identified as African-American.      Total Bilirubin 0.5 0.2 - 1.2 mg/dL   Alkaline Phosphatase 68 40 - 115 U/L   AST 14 10 - 35 U/L   ALT 9 9 - 46 U/L   Total Protein 6.9 6.1 - 8.1  g/dL   Albumin 4.4 3.6 - 5.1 g/dL   Calcium 9.6 8.6 - 10.3 mg/dL   GFR, Est African American 31 (L) >=60 mL/min   GFR, Est Non African American 27 (L) >=60 mL/min  CBC with Differential/Platelet     Status: None   Collection Time: 08/04/16 11:56 AM  Result Value Ref Range   WBC 7.0 3.8 - 10.8 K/uL   RBC 4.88 4.20 - 5.80 MIL/uL   Hemoglobin 15.5 13.2 - 17.1 g/dL   HCT 45.2 38.5 - 50.0 %   MCV 92.6 80.0 - 100.0 fL   MCH 31.8 27.0 - 33.0 pg   MCHC 34.3 32.0 - 36.0 g/dL   RDW 13.6 11.0 - 15.0 %   Platelets 140 140 - 400 K/uL   MPV 10.7 7.5 - 12.5 fL   Neutro Abs 4,900 1,500 - 7,800 cells/uL   Lymphs Abs 980 850 - 3,900 cells/uL   Monocytes Absolute 910 200 - 950 cells/uL   Eosinophils Absolute 210 15 - 500 cells/uL   Basophils Absolute 0 0 - 200 cells/uL   Neutrophils Relative % 70 %   Lymphocytes Relative 14 %   Monocytes Relative 13 %   Eosinophils Relative 3 %   Basophils Relative 0 %   Smear Review Criteria for review not met   Lipid panel     Status: Abnormal   Collection Time: 08/04/16 11:56 AM  Result Value Ref Range   Cholesterol 174 125 - 200 mg/dL   Triglycerides 167 (H) <150 mg/dL   HDL 38 (L) >=40 mg/dL   Total CHOL/HDL Ratio 4.6 <=5.0 Ratio   VLDL 33 (H) <30 mg/dL   LDL Cholesterol 103 <130 mg/dL    Comment:   Total Cholesterol/HDL Ratio:CHD Risk                        Coronary Heart Disease Risk Table                                        Men       Women          1/2 Average Risk              3.4         3.3              Average Risk              5.0        4.4           2X Average Risk              9.6        7.1           3X Average Risk             23.4       11.0 Use the calculated Patient Ratio above and the CHD Risk table  to determine the patient's CHD Risk.   Hemoglobin A1c     Status: None   Collection Time: 08/04/16 11:56 AM  Result Value Ref Range   Hgb A1c MFr Bld 5.2 <5.7 %    Comment:   For the purpose of screening for the presence of diabetes:   <5.7%       Consistent with the absence of diabetes 5.7-6.4 %  Consistent with increased risk for diabetes (prediabetes) >=6.5 %     Consistent with diabetes   This assay result is consistent with a decreased risk of diabetes.   Currently, no consensus exists regarding use of hemoglobin A1c for diagnosis of diabetes in children.   According to American Diabetes Association (ADA) guidelines, hemoglobin A1c <7.0% represents optimal control in non-pregnant diabetic patients. Different metrics may apply to specific patient populations. Standards of Medical Care in Diabetes (ADA).      Mean Plasma Glucose 103 mg/dL  Parathyroid hormone, intact (no Ca)     Status: None   Collection Time: 08/04/16 11:56 AM  Result Value Ref Range   PTH 44 14 - 64 pg/mL    Comment:   Interpretive Guide:                              Intact PTH               Calcium                              ----------               ------- Normal Parathyroid           Normal                   Normal Hypoparathyroidism           Low or Low Normal        Low Hyperparathyroidism      Primary                 Normal or High           High      Secondary               High                     Normal or Low      Tertiary                High                     High Non-Parathyroid   Hypercalcemia              Low or Low Normal        High   VITAMIN D 25 Hydroxy (Vit-D Deficiency, Fractures)     Status: None   Collection Time: 08/04/16 11:56 AM  Result Value  Ref Range   Vit D, 25-Hydroxy 48 30 - 100 ng/mL    Comment: Vitamin D Status           25-OH Vitamin D        Deficiency                <20 ng/mL        Insufficiency         20 - 29 ng/mL        Optimal             > or = 30 ng/mL   For 25-OH Vitamin D testing on patients on D2-supplementation and patients for whom quantitation of D2 and D3 fractions is required, the QuestAssureD 25-OH VIT D, (D2,D3), LC/MS/MS is recommended: order code [phone] (patients > 2 yrs).  PHQ2/9: Depression screen [facility] 2/9 09/20/2016 05/05/2016 10/28/2015  Decreased Interest 0 0 0  Down, Depressed, Hopeless 0 0 0  PHQ - 2 Score 0 0 0     Fall Risk: Fall Risk  09/20/2016 05/05/2016 10/28/2015  Falls in the past year? No No No     Functional Status Survey: Is the patient deaf or have difficulty hearing?: No Does the patient have difficulty seeing, even when wearing glasses/contacts?: No Does the patient have difficulty concentrating, remembering, or making decisions?: No Does the patient have difficulty walking or climbing stairs?: No Does the patient have difficulty dressing or bathing?: No Does the patient have difficulty doing errands alone such as visiting a doctor's office or shopping?: No   Assessment & Plan  1. Mixed hyperlipidemia  - simvastatin (ZOCOR) 20 MG tablet; Take 1 tablet (20 mg total) by mouth at bedtime.  Dispense: 90 tablet; Refill: 1  2. ASCVD (arteriosclerotic cardiovascular disease)  On beta-blocker - clopidogrel (PLAVIX) 75 MG tablet; Take 1 tablet (75 mg total) by mouth every morning.  Dispense: 90 tablet; Refill: 1  3. Essential hypertension  bp is towards low end of normal, looked at Dr. Ignacia Marvel note from Feb 2017 and he was not on metoprolol based on medication list. He has a history of AV block and we will decrease metoprolol to half dose and contact Dr. Georg Ruddle to find out if we should stop medication - metoprolol succinate (TOPROL-XL) 25 MG 24 hr tablet; Take  0.5 tablets (12.5 mg total) by mouth every morning.  Dispense: 45 tablet; Refill: 1  4. Bipolar 1 disorder ([place])  Continue follow up with psychiatrist   5. Chronic kidney disease, stage 3 (moderate)  Doing better, PTH back to normal, no longer has anemia, continue follow up with Dr. Johnney Ou  6. Sinoatrial node dysfunction (H[facility])  - clopidogrel (PLAVIX) 75 MG tablet; Take 1 tablet (75 mg total) by mouth every morning.  Dispense: 90 tablet; Refill: 1  7. 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 Benign fibroma of prostate  Doing well on Flomax  9. Chronic systolic heart failure ([facility])  Stable, not on diuretic  10. Needs flu shot  - Flu vaccine HIGH DOSE PF

## 2016-10-06 ENCOUNTER — Encounter: Payer: Self-pay | Admitting: Family Medicine

## 2016-10-06 NOTE — Telephone Encounter (Signed)
error 

## 2016-11-17 DIAGNOSIS — Z45018 Encounter for adjustment and management of other part of cardiac pacemaker: Secondary | ICD-10-CM | POA: Diagnosis not present

## 2016-11-22 ENCOUNTER — Ambulatory Visit (INDEPENDENT_AMBULATORY_CARE_PROVIDER_SITE_OTHER): Payer: Medicare Other | Admitting: Psychiatry

## 2016-11-22 ENCOUNTER — Encounter (HOSPITAL_COMMUNITY): Payer: Self-pay | Admitting: Psychiatry

## 2016-11-22 DIAGNOSIS — Z79899 Other long term (current) drug therapy: Secondary | ICD-10-CM

## 2016-11-22 DIAGNOSIS — F17210 Nicotine dependence, cigarettes, uncomplicated: Secondary | ICD-10-CM | POA: Diagnosis not present

## 2016-11-22 DIAGNOSIS — Z818 Family history of other mental and behavioral disorders: Secondary | ICD-10-CM

## 2016-11-22 DIAGNOSIS — F319 Bipolar disorder, unspecified: Secondary | ICD-10-CM

## 2016-11-22 DIAGNOSIS — Z9889 Other specified postprocedural states: Secondary | ICD-10-CM

## 2016-11-22 DIAGNOSIS — Z888 Allergy status to other drugs, medicaments and biological substances status: Secondary | ICD-10-CM

## 2016-11-22 MED ORDER — OLANZAPINE 20 MG PO TABS: ORAL_TABLET | ORAL | 0 refills | Status: DC

## 2016-11-22 MED ORDER — TRAZODONE HCL 50 MG PO TABS: 50.0000 mg | ORAL_TABLET | Freq: Every day | ORAL | 0 refills | Status: DC

## 2016-11-22 MED ORDER — LAMOTRIGINE 200 MG PO TABS: 200.0000 mg | ORAL_TABLET | Freq: Every day | ORAL | 0 refills | Status: DC

## 2016-11-22 MED ORDER — ALPRAZOLAM 0.5 MG PO TABS: 0.5000 mg | ORAL_TABLET | Freq: Three times a day (TID) | ORAL | 2 refills | Status: DC

## 2016-11-22 NOTE — Progress Notes (Signed)
[facility] MD/PA/NP OP Progress Note  11/22/2016 1:38 PM Jeffrey Herring  MRN:  [id number]  Chief Complaint:  Subjective:  I am feeling better.  I had a good Christmas.  HPI: Patient came for his follow-up appointment with his wife.  He is taking his medication and reported no side effects.  He denies any irritability, anger, mania or any psychosis.  He sleeping good.  He is keeping appointment with his nephrologist and primary care physician.  His last creatinine was 2.63 which was done in October 2017.  His wife also endorse that his mood has been stable.  He has no rash, itching, tremors or any shakes.  He had a good Christmas.  His energy level is fair.  He is taking Xanax, Lamictal, olanzapine and trazodone.  He has no paranoia or any hallucination.  His appetite is okay.  His vital signs are stable.    Visit Diagnosis:    ICD-9-CM ICD-10-CM   1. Bipolar 1 disorder (HCC) 296.7 F31.9 lamoTRIgine (LAMICTAL) 200 MG tablet     OLANZapine (ZYPREXA) 20 MG tablet     traZODone (DESYREL) 50 MG tablet     ALPRAZolam (XANAX) 0.5 MG tablet    Past Psychiatric History: Patient diagnosed with bipolar disorder and he had psychiatric hospitalization in the past due to mania.  He was also seen by consultation liaison services but he was admitted on the medical role due to lithium toxicity.  In the past he has taken Abilify, Risperdal, Lexapro, Wellbutrin and Navane. Patient has history of suicidal attempt in the past.  Past Medical History:  Past Medical History:  Diagnosis Date  . Arrhythmia   . Bipolar 1 disorder ([place])   . Depression   . Diabetes insipidus ([place])   . History of DVT (deep vein thrombosis)   . History of kidney stones   . History of pulmonary embolism   . Hyperlipemia   . Lithium toxicity   . Pacemaker   . Stage III chronic kidney disease     Past Surgical History:  Procedure Laterality Date  . CARDIAC SURGERY    . HERNIA REPAIR Right 02/05/14  . NOSE SURGERY    . PACEMAKER  PLACEMENT    . SPINE SURGERY      Family Psychiatric History: Reviewed.  Family History:  Family History  Problem Relation Age of Onset  . Depression Mother     Social History:  Social History   Social History  . Marital status: Married    Spouse name: N/A  . Number of children: N/A  . Years of education: N/A   Social History Main Topics  . Smoking status: Current Every Day Smoker    Packs/day: 0.10    Types: Cigars, Cigarettes  . Smokeless tobacco: Never Used  . Alcohol use No  . Drug use: No  . Sexual activity: No   Other Topics Concern  . None   Social History Narrative   ** Merged History Encounter **        Allergies:  Allergies  Allergen Reactions  . Aspirin Hives, Shortness Of Breath and Palpitations  . Codeine Nausea And Vomiting  . Demerol [Meperidine] Nausea And Vomiting  . Lithium      acute renal failure    Metabolic Disorder Labs: Lab Results  Component Value Date   HGBA1C 5.2 08/04/2016   MPG 103 08/04/2016   No results found for: PROLACTIN Lab Results  Component Value Date   CHOL 174 08/04/2016   TRIG  167 (H) 08/04/2016   HDL 38 (L) 08/04/2016   CHOLHDL 4.6 08/04/2016   VLDL 33 (H) 08/04/2016   LDLCALC 103 08/04/2016     Current Medications: Current Outpatient Prescriptions  Medication Sig Dispense Refill  . ALPRAZolam (XANAX) 0.5 MG tablet Take 1 tablet (0.5 mg total) by mouth 3 (three) times daily. 90 tablet 2  . Ascorbic Acid (VITAMIN C) 100 MG tablet Take 100 mg by mouth daily.    . cholecalciferol (VITAMIN D) 1000 units tablet Take 1,000 Units by mouth daily.    . clopidogrel (PLAVIX) 75 MG tablet Take 1 tablet (75 mg total) by mouth every morning. 90 tablet 1  . lamoTRIgine (LAMICTAL) 200 MG tablet Take 1 tablet (200 mg total) by mouth daily. 90 tablet 0  . metoprolol succinate (TOPROL-XL) 25 MG 24 hr tablet Take 0.5 tablets (12.5 mg total) by mouth every morning. 45 tablet 1  . OLANZapine (ZYPREXA) 20 MG tablet TAKE 1  TABLET BY MOUTH ATBEDTIME. 90 tablet 0  . simvastatin (ZOCOR) 20 MG tablet Take 1 tablet (20 mg total) by mouth at bedtime. 90 tablet 1  . tamsulosin (FLOMAX) 0.4 MG CAPS capsule Take 1 capsule (0.4 mg total) by mouth every morning. 90 capsule 1  . traZODone (DESYREL) 50 MG tablet Take 1 tablet (50 mg total) by mouth at bedtime. 90 tablet 0   No current facility-administered medications for this visit.     Neurologic: Headache: No Seizure: No Paresthesias: No  Musculoskeletal: Strength & Muscle Tone: within normal limits Gait & Station: normal Patient leans: N/A  Psychiatric Specialty Exam: Review of Systems  Constitutional: Negative.   HENT: Negative.   Musculoskeletal: Negative.   Skin: Negative.   Neurological: Negative.     Blood pressure 126/76, pulse 78, height 5\' 9"  (1.753 m), weight 191 lb (86.6 kg).Body mass index is 28.21 kg/m.  General Appearance: Casual  Eye Contact:  Fair  Speech:  Slow  Volume:  Decreased  Mood:  Euthymic  Affect:  Congruent  Thought Process:  Goal Directed  Orientation:  Full (Time, Place, and Person)  Thought Content: WDL and Logical   Suicidal Thoughts:  No  Homicidal Thoughts:  No  Memory:  Immediate;   Fair Recent;   Fair Remote;   Fair  Judgement:  Good  Insight:  Fair  Psychomotor Activity:  Normal  Concentration:  Concentration: Fair and Attention Span: Fair  Recall:  [employer] of Knowledge: Fair  Language: Good  Akathisia:  No  Handed:  Right  AIMS (if indicated):  0  Assets:  Communication Skills Desire for Improvement Housing Social Support  ADL's:  Intact  Cognition: WNL  Sleep:  good   Assessment: Bipolar disorder type I.  Plan: Patient is doing better on his current psychiatric medication.  He has no side effects.  I will continue Xanax 0.5 mg 3 times a day, Lamictal 200 mg daily, olanzapine 20 mg at bedtime and trazodone 50 mg as needed.  Patient has no rash, itching, tremors, shakes or any extrapyramidal  side effects.  Discussed medication side effects especially benzodiazepine dependence tolerance and withdrawal.  Recommend to call us back if he has any question, concern if he feel worsening of the symptom.  Follow-up in 3 months.     Felecity Lemaster T., MD 11/22/2016, 1:38 PM

## 2017-02-18 DIAGNOSIS — I420 Dilated cardiomyopathy: Secondary | ICD-10-CM | POA: Insufficient documentation

## 2017-02-19 DIAGNOSIS — I2510 Atherosclerotic heart disease of native coronary artery without angina pectoris: Secondary | ICD-10-CM | POA: Diagnosis not present

## 2017-02-19 DIAGNOSIS — I10 Essential (primary) hypertension: Secondary | ICD-10-CM | POA: Diagnosis not present

## 2017-02-19 DIAGNOSIS — I442 Atrioventricular block, complete: Secondary | ICD-10-CM | POA: Diagnosis not present

## 2017-02-19 DIAGNOSIS — Z45010 Encounter for checking and testing of cardiac pacemaker pulse generator [battery]: Secondary | ICD-10-CM | POA: Diagnosis not present

## 2017-02-19 DIAGNOSIS — Z45018 Encounter for adjustment and management of other part of cardiac pacemaker: Secondary | ICD-10-CM | POA: Diagnosis not present

## 2017-02-19 DIAGNOSIS — I420 Dilated cardiomyopathy: Secondary | ICD-10-CM | POA: Diagnosis not present

## 2017-02-20 ENCOUNTER — Ambulatory Visit (HOSPITAL_COMMUNITY): Payer: Self-pay | Admitting: Psychiatry

## 2017-03-15 ENCOUNTER — Ambulatory Visit (INDEPENDENT_AMBULATORY_CARE_PROVIDER_SITE_OTHER): Payer: Medicare Other | Admitting: Psychiatry

## 2017-03-15 ENCOUNTER — Encounter (HOSPITAL_COMMUNITY): Payer: Self-pay | Admitting: Psychiatry

## 2017-03-15 DIAGNOSIS — F17210 Nicotine dependence, cigarettes, uncomplicated: Secondary | ICD-10-CM | POA: Diagnosis not present

## 2017-03-15 DIAGNOSIS — Z818 Family history of other mental and behavioral disorders: Secondary | ICD-10-CM | POA: Diagnosis not present

## 2017-03-15 DIAGNOSIS — F319 Bipolar disorder, unspecified: Secondary | ICD-10-CM

## 2017-03-15 MED ORDER — ALPRAZOLAM 0.5 MG PO TABS: 0.5000 mg | ORAL_TABLET | Freq: Three times a day (TID) | ORAL | 2 refills | Status: DC

## 2017-03-15 MED ORDER — TRAZODONE HCL 50 MG PO TABS: 50.0000 mg | ORAL_TABLET | Freq: Every day | ORAL | 0 refills | Status: DC

## 2017-03-15 MED ORDER — OLANZAPINE 20 MG PO TABS: ORAL_TABLET | ORAL | 0 refills | Status: DC

## 2017-03-15 MED ORDER — LAMOTRIGINE 200 MG PO TABS: 200.0000 mg | ORAL_TABLET | Freq: Every day | ORAL | 0 refills | Status: DC

## 2017-03-15 NOTE — Progress Notes (Signed)
[facility] MD/PA/NP OP Progress Note  03/15/2017 3:14 PM Jeffrey Herring  MRN:  [id number]  Chief Complaint:  Subjective:  I'm doing much better with the medication.  HPI: Patient came with his wife for his follow-up appointment.  He's been taking his medication and denies any side effects.  He is scheduled to see his primary care physician on June 6 and he will get blood work including renal function test.  Patient denies any irritability, anger, mania or any psychosis.  He sleeping good.  He like his current psychiatric medication and he does not want to change it.  His energy level is good.  He is trying to lose weight and he has lost 9 pounds in past 3 months.  He has no rash, itching, tremors or shakes.  He denies any crying spells or any feeling of hopelessness, paranoia or any hallucination.  His appetite is okay.  His vital signs are stable.  Patient denies drinking alcohol or using any illegal substances.  Visit Diagnosis:    ICD-9-CM ICD-10-CM   1. Bipolar 1 disorder (HCC) 296.7 F31.9 OLANZapine (ZYPREXA) 20 MG tablet     traZODone (DESYREL) 50 MG tablet     lamoTRIgine (LAMICTAL) 200 MG tablet     ALPRAZolam (XANAX) 0.5 MG tablet    Past Psychiatric History: Reviewed. Patient diagnosed with bipolar disorder and he had psychiatric hospitalization in the past due to mania.  He was also seen by consultation liaison services but he was admitted on the medical role due to lithium toxicity.  In the past he has taken Abilify, Risperdal, Lexapro, Wellbutrin and Navane. Patient has history of suicidal attempt in the past.  Past Medical History:  Past Medical History:  Diagnosis Date  . Arrhythmia   . Bipolar 1 disorder ([place])   . Depression   . Diabetes insipidus ([place])   . History of DVT (deep vein thrombosis)   . History of kidney stones   . History of pulmonary embolism   . Hyperlipemia   . Lithium toxicity   . Pacemaker   . Stage III chronic kidney disease     Past Surgical History:   Procedure Laterality Date  . CARDIAC SURGERY    . HERNIA REPAIR Right 02/05/14  . NOSE SURGERY    . PACEMAKER PLACEMENT    . SPINE SURGERY      Family Psychiatric History: Reviewed.  Family History:  Family History  Problem Relation Age of Onset  . Depression Mother     Social History:  Social History   Social History  . Marital status: Married    Spouse name: N/A  . Number of children: N/A  . Years of education: N/A   Social History Main Topics  . Smoking status: Current Some Day Smoker    Packs/day: 0.10    Types: Cigars, Cigarettes  . Smokeless tobacco: Never Used     Comment: cigars occasionally  . Alcohol use No  . Drug use: No  . Sexual activity: No   Other Topics Concern  . Not on file   Social History Narrative   ** Merged History Encounter **        Allergies:  Allergies  Allergen Reactions  . Aspirin Hives, Shortness Of Breath and Palpitations  . Codeine Nausea And Vomiting  . Demerol [Meperidine] Nausea And Vomiting  . Lithium      acute renal failure    Metabolic Disorder Labs: Lab Results  Component Value Date   HGBA1C 5.2 08/04/2016  MPG 103 08/04/2016   No results found for: PROLACTIN Lab Results  Component Value Date   CHOL 174 08/04/2016   TRIG 167 (H) 08/04/2016   HDL 38 (L) 08/04/2016   CHOLHDL 4.6 08/04/2016   VLDL 33 (H) 08/04/2016   LDLCALC 103 08/04/2016     Current Medications: Current Outpatient Prescriptions  Medication Sig Dispense Refill  . ALPRAZolam (XANAX) 0.5 MG tablet Take 1 tablet (0.5 mg total) by mouth 3 (three) times daily. 90 tablet 2  . Ascorbic Acid (VITAMIN C) 100 MG tablet Take 100 mg by mouth daily.    . cholecalciferol (VITAMIN D) 1000 units tablet Take 1,000 Units by mouth daily.    . clopidogrel (PLAVIX) 75 MG tablet Take 1 tablet (75 mg total) by mouth every morning. 90 tablet 1  . lamoTRIgine (LAMICTAL) 200 MG tablet Take 1 tablet (200 mg total) by mouth daily. 90 tablet 0  . metoprolol  succinate (TOPROL-XL) 25 MG 24 hr tablet Take 0.5 tablets (12.5 mg total) by mouth every morning. 45 tablet 1  . OLANZapine (ZYPREXA) 20 MG tablet TAKE 1 TABLET BY MOUTH ATBEDTIME. 90 tablet 0  . simvastatin (ZOCOR) 20 MG tablet Take 1 tablet (20 mg total) by mouth at bedtime. 90 tablet 1  . tamsulosin (FLOMAX) 0.4 MG CAPS capsule Take 1 capsule (0.4 mg total) by mouth every morning. 90 capsule 1  . traZODone (DESYREL) 50 MG tablet Take 1 tablet (50 mg total) by mouth at bedtime. 90 tablet 0   No current facility-administered medications for this visit.     Neurologic: Headache: No Seizure: No Paresthesias: No  Musculoskeletal: Strength & Muscle Tone: within normal limits Gait & Station: normal Patient leans: N/A  Psychiatric Specialty Exam: Review of Systems  Constitutional: Negative.   Musculoskeletal: Negative.   Skin: Negative.  Negative for itching and rash.  Neurological: Negative.     Blood pressure (!) 135/95, pulse 86, height 5\' 10"  (1.778 m), weight 181 lb 12.8 oz (82.5 kg).Body mass index is 26.09 kg/m.  General Appearance: Casual  Eye Contact:  Fair  Speech:  Slow  Volume:  Normal  Mood:  Euthymic  Affect:  Appropriate  Thought Process:  Goal Directed  Orientation:  Full (Time, Place, and Person)  Thought Content: Logical   Suicidal Thoughts:  No  Homicidal Thoughts:  No  Memory:  Immediate;   Good Recent;   Fair Remote;   Fair  Judgement:  Good  Insight:  Good  Psychomotor Activity:  Normal  Concentration:  Concentration: Fair and Attention Span: Fair  Recall:  Good  Fund of Knowledge: Good  Language: Good  Akathisia:  No  Handed:  Right  AIMS (if indicated):  0  Assets:  Communication Skills Desire for Improvement Resilience Social Support Transportation  ADL's:  Intact  Cognition: WNL  Sleep:  Good    Assessment: Bipolar disorder type I.  Plan: Patient is a stable on his current psychiatric medication.  He has no tremors, shakes or any  EPS.  Continue Xanax 0.5 mg 3 times a day, Lamictal 200 mg daily and olanzapine 20 mg at bedtime.  He is taking trazodone 50 mg at bedtime which is helping his sleep.  I reinforced to have his blood work faxed to [place] when he sees primary care physician.  His last creatinine was 2.53.  Recommended to call us back if he has any question, concern or feel worsening of the symptom.  Follow-up in 3 months.  Garrin Kirwan T., MD  03/15/2017, 3:14 PM

## 2017-03-26 ENCOUNTER — Ambulatory Visit (INDEPENDENT_AMBULATORY_CARE_PROVIDER_SITE_OTHER): Payer: Medicare Other

## 2017-03-26 ENCOUNTER — Ambulatory Visit: Payer: Medicare Other

## 2017-03-26 ENCOUNTER — Ambulatory Visit (INDEPENDENT_AMBULATORY_CARE_PROVIDER_SITE_OTHER): Payer: Medicare Other | Admitting: Family Medicine

## 2017-03-26 ENCOUNTER — Telehealth: Payer: Self-pay | Admitting: Family Medicine

## 2017-03-26 ENCOUNTER — Encounter: Payer: Self-pay | Admitting: Family Medicine

## 2017-03-26 ENCOUNTER — Ambulatory Visit: Payer: Medicare Other | Admitting: Family Medicine

## 2017-03-26 VITALS — BP 128/72 | HR 64 | Temp 98.2°F | Ht 70.0 in | Wt 181.2 lb

## 2017-03-26 VITALS — BP 128/72 | HR 64 | Temp 98.2°F | Resp 16 | Ht 70.0 in | Wt 181.2 lb

## 2017-03-26 DIAGNOSIS — I2510 Atherosclerotic heart disease of native coronary artery without angina pectoris: Secondary | ICD-10-CM | POA: Diagnosis not present

## 2017-03-26 DIAGNOSIS — N138 Other obstructive and reflux uropathy: Secondary | ICD-10-CM | POA: Diagnosis not present

## 2017-03-26 DIAGNOSIS — Z0000 Encounter for general adult medical examination without abnormal findings: Secondary | ICD-10-CM

## 2017-03-26 DIAGNOSIS — N183 Chronic kidney disease, stage 3 unspecified: Secondary | ICD-10-CM

## 2017-03-26 DIAGNOSIS — I495 Sick sinus syndrome: Secondary | ICD-10-CM | POA: Diagnosis not present

## 2017-03-26 DIAGNOSIS — E782 Mixed hyperlipidemia: Secondary | ICD-10-CM

## 2017-03-26 DIAGNOSIS — I5022 Chronic systolic (congestive) heart failure: Secondary | ICD-10-CM | POA: Diagnosis not present

## 2017-03-26 DIAGNOSIS — I10 Essential (primary) hypertension: Secondary | ICD-10-CM | POA: Diagnosis not present

## 2017-03-26 DIAGNOSIS — L210 Seborrhea capitis: Secondary | ICD-10-CM

## 2017-03-26 DIAGNOSIS — N401 Enlarged prostate with lower urinary tract symptoms: Secondary | ICD-10-CM

## 2017-03-26 DIAGNOSIS — F319 Bipolar disorder, unspecified: Secondary | ICD-10-CM

## 2017-03-26 DIAGNOSIS — L218 Other seborrheic dermatitis: Secondary | ICD-10-CM | POA: Diagnosis not present

## 2017-03-26 LAB — CBC WITH DIFFERENTIAL/PLATELET
Basophils Absolute: 0 cells/uL (ref 0–200)
Basophils Relative: 0 %
Eosinophils Absolute: 0 cells/uL — ABNORMAL LOW (ref 15–500)
Eosinophils Relative: 0 %
HCT: 46.2 % (ref 38.5–50.0)
Hemoglobin: 15.8 g/dL (ref 13.2–17.1)
Lymphocytes Relative: 10 %
Lymphs Abs: 860 cells/uL (ref 850–3900)
MCH: 31.7 pg (ref 27.0–33.0)
MCHC: 34.2 g/dL (ref 32.0–36.0)
MCV: 92.8 fL (ref 80.0–100.0)
MPV: 10.4 fL (ref 7.5–12.5)
Monocytes Absolute: 946 cells/uL (ref 200–950)
Monocytes Relative: 11 %
Neutro Abs: 6794 cells/uL (ref 1500–7800)
Neutrophils Relative %: 79 %
Platelets: 149 10*3/uL (ref 140–400)
RBC: 4.98 MIL/uL (ref 4.20–5.80)
RDW: 14.2 % (ref 11.0–15.0)
WBC: 8.6 10*3/uL (ref 3.8–10.8)

## 2017-03-26 LAB — COMPLETE METABOLIC PANEL WITH GFR
ALT: 7 U/L — ABNORMAL LOW (ref 9–46)
AST: 13 U/L (ref 10–35)
Albumin: 4.6 g/dL (ref 3.6–5.1)
Alkaline Phosphatase: 77 U/L (ref 40–115)
BUN: 23 mg/dL (ref 7–25)
CO2: 20 mmol/L (ref 20–31)
Calcium: 10.4 mg/dL — ABNORMAL HIGH (ref 8.6–10.3)
Chloride: 107 mmol/L (ref 98–110)
Creat: 2.57 mg/dL — ABNORMAL HIGH (ref 0.70–1.25)
GFR, Est African American: 29 mL/min — ABNORMAL LOW (ref >=60)
GFR, Est Non African American: 25 mL/min — ABNORMAL LOW (ref >=60)
Glucose, Bld: 96 mg/dL (ref 65–99)
Potassium: 4.4 mmol/L (ref 3.5–5.3)
Sodium: 141 mmol/L (ref 135–146)
Total Bilirubin: 0.4 mg/dL (ref 0.2–1.2)
Total Protein: 7.4 g/dL (ref 6.1–8.1)

## 2017-03-26 LAB — LIPID PANEL
Cholesterol: 172 mg/dL (ref <200)
HDL: 36 mg/dL — ABNORMAL LOW (ref >40)
LDL Cholesterol: 92 mg/dL (ref <100)
Total CHOL/HDL Ratio: 4.8 Ratio (ref <5.0)
Triglycerides: 219 mg/dL — ABNORMAL HIGH (ref <150)
VLDL: 44 mg/dL — ABNORMAL HIGH (ref <30)

## 2017-03-26 MED ORDER — CLOPIDOGREL BISULFATE 75 MG PO TABS: 75.0000 mg | ORAL_TABLET | Freq: Every morning | ORAL | 1 refills | Status: DC

## 2017-03-26 MED ORDER — TAMSULOSIN HCL 0.4 MG PO CAPS: 0.4000 mg | ORAL_CAPSULE | Freq: Every morning | ORAL | 1 refills | Status: DC

## 2017-03-26 MED ORDER — FLUOCINOLONE ACETONIDE 0.01 % EX SHAM: 1.0000 | MEDICATED_SHAMPOO | Freq: Every day | CUTANEOUS | 0 refills | Status: DC

## 2017-03-26 MED ORDER — SIMVASTATIN 20 MG PO TABS: 20.0000 mg | ORAL_TABLET | Freq: Every day | ORAL | 1 refills | Status: DC

## 2017-03-26 MED ORDER — METOPROLOL SUCCINATE ER 25 MG PO TB24: 12.5000 mg | ORAL_TABLET | Freq: Every morning | ORAL | 1 refills | Status: DC

## 2017-03-26 NOTE — Patient Instructions (Signed)
[name] , Thank you for taking time to come for your Medicare Wellness Visit. I appreciate your ongoing commitment to your health goals. Please review the following plan we discussed and let me know if I can assist you in the future.   Screening recommendations/referrals: Colonoscopy: completed 12/09/10, due 11/2020 Recommended yearly ophthalmology/optometry visit for glaucoma screening and checkup Recommended yearly dental visit for hygiene and checkup  Vaccinations: Influenza vaccine: up to date, due 06/2017 Pneumococcal vaccine: completed series Tdap vaccine: declined Shingles vaccine: declined  Advanced directives: Advance directive discussed with you today. I have provided a copy for you to complete at home and have notarized. Once this is complete please bring a copy in to our office so we can scan it into your chart.  Conditions/risks identified: Recommend walking three times a week for 20-30 minutes.   Next appointment: None, need to schedule 1 year AWV.  Preventive Care 67 Years and Older, Male Preventive care refers to lifestyle choices and visits with your health care provider that can promote health and wellness. What does preventive care include?  A yearly physical exam. This is also called an annual well check.  Dental exams once or twice a year.  Routine eye exams. Ask your health care provider how often you should have your eyes checked.  Personal lifestyle choices, including:  Daily care of your teeth and gums.  Regular physical activity.  Eating a healthy diet.  Avoiding tobacco and drug use.  Limiting alcohol use.  Practicing safe sex.  Taking low doses of aspirin every day.  Taking vitamin and mineral supplements as recommended by your health care provider. What happens during an annual well check? The services and screenings done by your health care provider during your annual well check will depend on your age, overall health, lifestyle risk  factors, and family history of disease. Counseling  Your health care provider may ask you questions about your:  Alcohol use.  Tobacco use.  Drug use.  Emotional well-being.  Home and relationship well-being.  Sexual activity.  Eating habits.  History of falls.  Memory and ability to understand (cognition).  Work and work Statistician. Screening  You may have the following tests or measurements:  Height, weight, and BMI.  Blood pressure.  Lipid and cholesterol levels. These may be checked every 5 years, or more frequently if you are over 78 years old.  Skin check.  Lung cancer screening. You may have this screening every year starting at age 9 if you have a 30-pack-year history of smoking and currently smoke or have quit within the past 15 years.  Fecal occult blood test (FOBT) of the stool. You may have this test every year starting at age 27.  Flexible sigmoidoscopy or colonoscopy. You may have a sigmoidoscopy every 5 years or a colonoscopy every 10 years starting at age 66.  Prostate cancer screening. Recommendations will vary depending on your family history and other risks.  Hepatitis C blood test.  Hepatitis B blood test.  Sexually transmitted disease (STD) testing.  Diabetes screening. This is done by checking your blood sugar (glucose) after you have not eaten for a while (fasting). You may have this done every 1-3 years.  Abdominal aortic aneurysm (AAA) screening. You may need this if you are a current or former smoker.  Osteoporosis. You may be screened starting at age 52 if you are at high risk. Talk with your health care provider about your test results, treatment options, and if necessary, the  need for more tests. Vaccines  Your health care provider may recommend certain vaccines, such as:  Influenza vaccine. This is recommended every year.  Tetanus, diphtheria, and acellular pertussis (Tdap, Td) vaccine. You may need a Td booster every 10  years.  Zoster vaccine. You may need this after age 60.  Pneumococcal 13-valent conjugate (PCV13) vaccine. One dose is recommended after age 61.  Pneumococcal polysaccharide (PPSV23) vaccine. One dose is recommended after age 12. Talk to your health care provider about which screenings and vaccines you need and how often you need them. This information is not intended to replace advice given to you by your health care provider. Make sure you discuss any questions you have with your health care provider. Document Released: 10/29/2015 Document Revised: 06/21/2016 Document Reviewed: 08/03/2015 Elsevier Interactive Patient Education  2017 [place] Prevention in the Home Falls can cause injuries. They can happen to people of all ages. There are many things you can do to make your home safe and to help prevent falls. What can I do on the outside of my home?  Regularly fix the edges of walkways and driveways and fix any cracks.  Remove anything that might make you trip as you walk through a door, such as a raised step or threshold.  Trim any bushes or trees on the path to your home.  Use bright outdoor lighting.  Clear any walking paths of anything that might make someone trip, such as rocks or tools.  Regularly check to see if handrails are loose or broken. Make sure that both sides of any steps have handrails.  Any raised decks and porches should have guardrails on the edges.  Have any leaves, snow, or ice cleared regularly.  Use sand or salt on walking paths during winter.  Clean up any spills in your garage right away. This includes oil or grease spills. What can I do in the bathroom?  Use night lights.  Install grab bars by the toilet and in the tub and shower. Do not use towel bars as grab bars.  Use non-skid mats or decals in the tub or shower.  If you need to sit down in the shower, use a plastic, non-slip stool.  Keep the floor dry. Clean up any water that  spills on the floor as soon as it happens.  Remove soap buildup in the tub or shower regularly.  Attach bath mats securely with double-sided non-slip rug tape.  Do not have throw rugs and other things on the floor that can make you trip. What can I do in the bedroom?  Use night lights.  Make sure that you have a light by your bed that is easy to reach.  Do not use any sheets or blankets that are too big for your bed. They should not hang down onto the floor.  Have a firm chair that has side arms. You can use this for support while you get dressed.  Do not have throw rugs and other things on the floor that can make you trip. What can I do in the kitchen?  Clean up any spills right away.  Avoid walking on wet floors.  Keep items that you use a lot in easy-to-reach places.  If you need to reach something above you, use a strong step stool that has a grab bar.  Keep electrical cords out of the way.  Do not use floor polish or wax that makes floors slippery. If you must use wax,  use non-skid floor wax.  Do not have throw rugs and other things on the floor that can make you trip. What can I do with my stairs?  Do not leave any items on the stairs.  Make sure that there are handrails on both sides of the stairs and use them. Fix handrails that are broken or loose. Make sure that handrails are as long as the stairways.  Check any carpeting to make sure that it is firmly attached to the stairs. Fix any carpet that is loose or worn.  Avoid having throw rugs at the top or bottom of the stairs. If you do have throw rugs, attach them to the floor with carpet tape.  Make sure that you have a light switch at the top of the stairs and the bottom of the stairs. If you do not have them, ask someone to add them for you. What else can I do to help prevent falls?  Wear shoes that:  Do not have high heels.  Have rubber bottoms.  Are comfortable and fit you well.  Are closed at the  toe. Do not wear sandals.  If you use a stepladder:  Make sure that it is fully opened. Do not climb a closed stepladder.  Make sure that both sides of the stepladder are locked into place.  Ask someone to hold it for you, if possible.  Clearly mark and make sure that you can see:  Any grab bars or handrails.  First and last steps.  Where the edge of each step is.  Use tools that help you move around (mobility aids) if they are needed. These include:  Canes.  Walkers.  Scooters.  Crutches.  Turn on the lights when you go into a dark area. Replace any light bulbs as soon as they burn out.  Set up your furniture so you have a clear path. Avoid moving your furniture around.  If any of your floors are uneven, fix them.  If there are any pets around you, be aware of where they are.  Review your medicines with your doctor. Some medicines can make you feel dizzy. This can increase your chance of falling. Ask your doctor what other things that you can do to help prevent falls. This information is not intended to replace advice given to you by your health care provider. Make sure you discuss any questions you have with your health care provider. Document Released: 07/29/2009 Document Revised: 03/09/2016 Document Reviewed: 11/06/2014 Elsevier Interactive Patient Education  2017 [employer].

## 2017-03-26 NOTE — Progress Notes (Signed)
Name: Jeffrey Herring   MRN: [id number]    DOB: [birth year]   Date:03/26/2017       Progress Note  Subjective  Chief Complaint  Chief Complaint  Patient presents with  . Medication Refill    6 month F/U  . Hypertension    Denies any symptoms  . Hyperlipidemia  . Anemia  . Benign Prostatic Hypertrophy    HPI  CHF: he states he has some orthopnea occasionally, but no chest pain or palpitation. He has a pacemaker since 1983, had some vegetation on pacemaker leads back in 2009 and is seeing Dr. Georg Ruddle at [facility] He has one vessel  CAD and is on Plavix and Metoprolol , he is allergic to aspirin. He has SOB with activity, takes breaks when walking.   CKI:  He has been back to see Dr. Johnney Ou - nephrologist.  We will recheck labs toda, he has good urine output  Bipolar disorder I: he had severe side effects with Lithium and he is doing well on Zyprexa. No recent episodes of mania, he was disabled since age 1 . His hgbA1C was normal in 07/2016  Anemia of chronic disease: last labs were normal, he stopped taking iron and is doing well We will recheck levels  BPH: he has urinary frequency, taking Flomax to control symptoms. He has nocturia only once per night, but used to be more frequent  HTN: taking medication, no chest pain or palpitation  Hyperlipidemia: taking simvastatin and denies myalgias.   Seborrhea: severe on nuchal area, we will give him medication   Patient Active Problem List   Diagnosis Date Noted  . BPH (benign prostatic hyperplasia) 11/04/2015  . Chronic kidney disease (CKD), stage III (moderate) 10/28/2015  . Chronic systolic heart failure ([place]) 10/28/2015  . HLD (hyperlipidemia) 10/28/2015  . H/O deep venous thrombosis 10/28/2015  . History of sepsis 04/09/2014  . Inguinal hernia 01/06/2014  . Complete atrioventricular block ([place]) 10/14/2013  . Sinoatrial node dysfunction ([place]) 10/14/2013  . Kidney cysts 05/06/2013  . Hypertension,  benign 10/01/2012  . Healed or old pulmonary embolism 09/30/2012  . Artificial cardiac pacemaker 09/30/2012  . Bipolar 1 disorder ([place]) 05/01/2012  . Arteriosclerosis of coronary artery 06/16/2009    Past Surgical History:  Procedure Laterality Date  . CARDIAC SURGERY    . HERNIA REPAIR Right 02/05/14  . NOSE SURGERY    . PACEMAKER PLACEMENT    . SPINE SURGERY      Family History  Problem Relation Age of Onset  . Depression Mother     Social History   Social History  . Marital status: Married    Spouse name: N/A  . Number of children: N/A  . Years of education: N/A   Occupational History  . Not on file.   Social History Main Topics  . Smoking status: Current Every Day Smoker    Packs/day: 2.00    Years: 40.00    Types: Cigars  . Smokeless tobacco: Never Used     Comment: used to smoke cigarettes for 20 years  . Alcohol use No  . Drug use: No  . Sexual activity: No   Other Topics Concern  . 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  ALPRAZolam (XANAX) 0.5 MG tablet, Take 1 tablet (0.5 mg total) by mouth 3 (three) times daily., Disp: 90 tablet, Rfl: 2 .  cholecalciferol (VITAMIN D) 1000 units tablet, Take 1,000 Units by mouth daily., Disp: , Rfl:  .  clopidogrel (PLAVIX) 75 MG tablet, Take 1 tablet (75 mg total) by mouth every morning., Disp: 90 tablet, Rfl: 1 .  lamoTRIgine (LAMICTAL) 200 MG tablet, Take 1 tablet (200 mg total) by mouth daily., Disp: 90 tablet, Rfl: 0 .  metoprolol succinate (TOPROL-XL) 25 MG 24 hr tablet, Take 0.5 tablets (12.5 mg total) by mouth every morning., Disp: 45 tablet, Rfl: 1 .  OLANZapine (ZYPREXA) 20 MG tablet, TAKE 1 TABLET BY MOUTH ATBEDTIME., Disp: 90 tablet, Rfl: 0 .  simvastatin (ZOCOR) 20 MG tablet, Take 1 tablet (20 mg  total) by mouth at bedtime., Disp: 90 tablet, Rfl: 1 .  tamsulosin (FLOMAX) 0.4 MG CAPS capsule, Take 1 capsule (0.4 mg total) by mouth every morning., Disp: 90 capsule, Rfl: 1 .  traZODone (DESYREL) 50 MG tablet, Take 1 tablet (50 mg total) by mouth at bedtime., Disp: 90 tablet, Rfl: 0 .  vitamin C (ASCORBIC ACID) 500 MG tablet, Take 500 mg by mouth daily. , Disp: , Rfl:   Allergies  Allergen Reactions  . Aspirin Hives, Shortness Of Breath and Palpitations  . Codeine Nausea And Vomiting  . Demerol [Meperidine] Nausea And Vomiting  . Lithium      acute renal failure     ROS   Constitutional: Negative for fever or weight change.  Respiratory: Negative for cough but shortness of breath with exercise.   Cardiovascular: Negative for chest pain or palpitations.  Gastrointestinal: Negative for abdominal pain, no bowel changes.  Musculoskeletal: Negative for gait problem or joint swelling.  Skin: Negative for rash.  Neurological: Negative for dizziness (had one episode one week ago when he got up but resolved ) no headache.  No other specific complaints in a complete review of systems (except as listed in HPI above). Objective  Vitals:   03/26/17 0932  BP: 128/72  Pulse: 64  Resp: 16  Temp: 98.2 F (36.8 C)  TempSrc: Oral  SpO2: 98%  Weight: 181 lb 3.2 oz (82.2 kg)  Height: 5\' 10"  (1.778 m)    Body mass index is 26 kg/m.  Physical Exam  Constitutional: Patient appears well-developed and well-nourished. Obese No distress.  HEENT: head atraumatic, normocephalic, pupils equal and reactive to light,  neck supple, throat within normal limits Cardiovascular: Normal rate, regular rhythm and normal heart sounds.  No murmur heard. No BLE edema. Pulmonary/Chest: Effort normal and breath sounds normal. No respiratory distress. Abdominal: Soft.  There is no tenderness. Skin: seborrhea nuchal area, with some scalp irritation Psychiatric: Patient has a normal mood and affect. behavior  is normal. Judgment and thought content normal.  PHQ2/9: Depression screen [facility] 2/9 03/26/2017 09/20/2016 05/05/2016 10/28/2015  Decreased Interest 0 0 0 0  Down, Depressed, Hopeless 0 0 0 0  PHQ - 2 Score 0 0 0 0     Fall Risk: Fall Risk  03/26/2017 09/20/2016 05/05/2016 10/28/2015  Falls in the past year? No No No No     Assessment & Plan  1. Mixed hyperlipidemia  - simvastatin (ZOCOR) 20 MG tablet; Take 1 tablet (20 mg total) by mouth at bedtime.  Dispense: 90 tablet; Refill: 1 - Lipid panel  2. ASCVD (arteriosclerotic cardiovascular disease)  - clopidogrel (PLAVIX) 75 MG tablet; Take 1 tablet (  75 mg total) by mouth every morning.  Dispense: 90 tablet; Refill: 1  3. Essential hypertension  - metoprolol succinate (TOPROL-XL) 25 MG 24 hr tablet; Take 0.5 tablets (12.5 mg total) by mouth every morning.  Dispense: 45 tablet; Refill: 1 - COMPLETE METABOLIC PANEL WITH GFR - CBC with Differential/Platelet  4. Bipolar 1 disorder (HCC)  stable  5. Chronic kidney disease, stage 3 (moderate)  Check labs and send to nephrologist   6. Sinoatrial node dysfunction (HCC)  - clopidogrel (PLAVIX) 75 MG tablet; Take 1 tablet (75 mg total) by mouth every morning.  Dispense: 90 tablet; Refill: 1  7. Chronic systolic heart failure (HCC)  Continue medication   8. Benign prostatic hyperplasia with urinary obstruction  - tamsulosin (FLOMAX) 0.4 MG CAPS capsule; Take 1 capsule (0.4 mg total) by mouth every morning.  Dispense: 90 capsule; Refill: 1  9. Seborrhea capitis in adult  - Fluocinolone Acetonide 0.01 % SHAM; Apply 1 each topically daily.  Dispense: 120 mL; Refill: 0

## 2017-03-26 NOTE — Progress Notes (Addendum)
Subjective:   Jeffrey Herring is a 66 y.o. male who presents for Medicare Annual/Subsequent preventive examination.  Review of Systems:  N/A  Cardiac Risk Factors include: advanced age (>76men, >53 women);dyslipidemia;hypertension;male gender;smoking/ tobacco exposure     Objective:    Vitals: BP 128/72 (BP Location: Right Arm)   Pulse 64   Temp 98.2 F (36.8 C) (Oral)   Ht 5\' 10"  (1.778 m)   Wt 181 lb 3.2 oz (82.2 kg)   BMI 26.00 kg/m   Body mass index is 26 kg/m.  Tobacco History  Smoking Status  . Current Some Day Smoker  . Packs/day: 0.10  . Types: Cigars  Smokeless Tobacco  . Never Used    Comment: cigars occasionally     Ready to quit: Not Answered Counseling given: Not Answered   Past Medical History:  Diagnosis Date  . Arrhythmia   . Bipolar 1 disorder ([place])   . Depression   . Diabetes insipidus ([place])   . History of DVT (deep vein thrombosis)   . History of kidney stones   . History of pulmonary embolism   . Hyperlipemia   . Lithium toxicity   . Pacemaker   . Stage III chronic kidney disease    Past Surgical History:  Procedure Laterality Date  . CARDIAC SURGERY    . HERNIA REPAIR Right 02/05/14  . NOSE SURGERY    . PACEMAKER PLACEMENT    . SPINE SURGERY     Family History  Problem Relation Age of Onset  . Depression Mother    History  Sexual Activity  . Sexual activity: No    Outpatient Encounter Prescriptions as of 03/26/2017  Medication Sig  . ALPRAZolam (XANAX) 0.5 MG tablet Take 1 tablet (0.5 mg total) by mouth 3 (three) times daily.  . Ascorbic Acid (VITAMIN C) 100 MG tablet Take 100 mg by mouth daily.  . cholecalciferol (VITAMIN D) 1000 units tablet Take 1,000 Units by mouth daily.  . clopidogrel (PLAVIX) 75 MG tablet Take 1 tablet (75 mg total) by mouth every morning.  . lamoTRIgine (LAMICTAL) 200 MG tablet Take 1 tablet (200 mg total) by mouth daily.  . metoprolol succinate (TOPROL-XL) 25 MG 24 hr tablet Take 0.5 tablets  (12.5 mg total) by mouth every morning.  Marland Kitchen OLANZapine (ZYPREXA) 20 MG tablet TAKE 1 TABLET BY MOUTH ATBEDTIME.  . simvastatin (ZOCOR) 20 MG tablet Take 1 tablet (20 mg total) by mouth at bedtime.  . tamsulosin (FLOMAX) 0.4 MG CAPS capsule Take 1 capsule (0.4 mg total) by mouth every morning.  . traZODone (DESYREL) 50 MG tablet Take 1 tablet (50 mg total) by mouth at bedtime.   No facility-administered encounter medications on file as of 03/26/2017.     Activities of Daily Living In your present state of health, do you have any difficulty performing the following activities: 03/26/2017 09/20/2016  Hearing? N N  Vision? N N  Difficulty concentrating or making decisions? N N  Walking or climbing stairs? N N  Dressing or bathing? N N  Doing errands, shopping? N N  Preparing Food and eating ? N -  Using the Toilet? N -  In the past six months, have you accidently leaked urine? N -  Do you have problems with loss of bowel control? N -  Managing your Medications? N -  Managing your Finances? N -  Housekeeping or managing your Housekeeping? N -  Some recent data might be hidden    Patient Care Team:  Steele Sizer, MD as PCP - General (Family Medicine) Justin Mend, MD as Attending Physician (Internal Medicine) Georg Ruddle, Ashok Cordia, MD as Referring Physician (Cardiology) [name], MD as Consulting Physician (Psychiatry)   Assessment:     Exercise Activities and Dietary recommendations Current Exercise Habits: The patient does not participate in regular exercise at present, Exercise limited by: None identified  Goals    . Exercise 3x per week (30 min per time)          Recommend walking three times a week for 20-30 minutes.       Fall Risk Fall Risk  03/26/2017 09/20/2016 05/05/2016 10/28/2015  Falls in the past year? No No No No   Depression Screen PHQ 2/9 Scores 03/26/2017 09/20/2016 05/05/2016 10/28/2015  PHQ - 2 Score 0 0 0 0    Cognitive Function     6CIT Screen  03/26/2017  What Year? 0 points  What month? 0 points  What time? 0 points  Count back from 20 0 points  Months in reverse 0 points  Repeat phrase 0 points  Total Score 0    Immunization History  Administered Date(s) Administered  . Influenza, High Dose Seasonal PF 10/28/2015, 09/20/2016  . Influenza-Unspecified 07/16/2013, 05/16/2014, 10/28/2015  . Pneumococcal Conjugate-13 10/28/2015  . Pneumococcal Polysaccharide-23 04/23/2014  . Tdap 10/27/2005   Screening Tests Health Maintenance  Topic Date Due  . Hepatitis C Screening  08/31/2019 (Originally [birth year])  . TETANUS/TDAP  10/16/2026 (Originally 10/28/2015)  . INFLUENZA VACCINE  05/16/2017  . PNA vac Low Risk Adult (2 of 2 - PPSV23) 04/24/2019  . COLONOSCOPY  12/09/2020      Plan:  I have personally reviewed and addressed the Medicare Annual Wellness questionnaire and have noted the following in the patient's chart:  A. Medical and social history B. Use of alcohol, tobacco or illicit drugs  C. Current medications and supplements D. Functional ability and status E.  Nutritional status F.  Physical activity G. Advance directives H. List of other physicians I.  Hospitalizations, surgeries, and ER visits in previous 12 months J.  [place] such as hearing and vision if needed, cognitive and depression L. Referrals and appointments - none  In addition, I have reviewed and discussed with patient certain preventive protocols, quality metrics, and best practice recommendations. A written personalized care plan for preventive services as well as general preventive health recommendations were provided to patient.  See attached scanned questionnaire for additional information.   Signed,  Fabio Neighbors, LPN Nurse Health Advisor   MD Recommendations: None. Pt declined tetanus today.  I have reviewed this encounter including the documentation in this note and/or discussed this patient with the provider, Peggye Fothergill, LPN I am certifying that I agree with the content of this note as supervising physician.  Steele Sizer, MD [facility] 04/30/2017, 4:24 PM

## 2017-03-26 NOTE — Telephone Encounter (Signed)
Pt was prescribed a shampoo and it will cost them over $400. He is asking that you please call in something less expensive.  Is there a generic that you could prescribe. Please let patient know when this has been submitted to pharmacy because she want to check price before going to pick up  [phone]

## 2017-03-27 ENCOUNTER — Other Ambulatory Visit: Payer: Self-pay | Admitting: Family Medicine

## 2017-03-27 MED ORDER — TRIAMCINOLONE ACETONIDE 0.1 % EX LOTN: 1.0000 "application " | TOPICAL_LOTION | CUTANEOUS | 0 refills | Status: DC

## 2017-03-27 NOTE — Telephone Encounter (Signed)
Changed to lotion, not sure of the cost

## 2017-03-27 NOTE — Telephone Encounter (Signed)
Will call the pharmacy tomorrow since they do not have medication coverage to see if the lotion is cheaper.

## 2017-05-28 DIAGNOSIS — Z950 Presence of cardiac pacemaker: Secondary | ICD-10-CM | POA: Diagnosis not present

## 2017-05-28 DIAGNOSIS — Z45010 Encounter for checking and testing of cardiac pacemaker pulse generator [battery]: Secondary | ICD-10-CM | POA: Diagnosis not present

## 2017-05-28 DIAGNOSIS — Z45018 Encounter for adjustment and management of other part of cardiac pacemaker: Secondary | ICD-10-CM | POA: Diagnosis not present

## 2017-06-14 ENCOUNTER — Encounter (HOSPITAL_COMMUNITY): Payer: Self-pay | Admitting: Psychiatry

## 2017-06-14 ENCOUNTER — Ambulatory Visit (INDEPENDENT_AMBULATORY_CARE_PROVIDER_SITE_OTHER): Payer: Medicare Other | Admitting: Psychiatry

## 2017-06-14 DIAGNOSIS — I2510 Atherosclerotic heart disease of native coronary artery without angina pectoris: Secondary | ICD-10-CM | POA: Diagnosis not present

## 2017-06-14 DIAGNOSIS — F319 Bipolar disorder, unspecified: Secondary | ICD-10-CM | POA: Diagnosis not present

## 2017-06-14 DIAGNOSIS — Z818 Family history of other mental and behavioral disorders: Secondary | ICD-10-CM

## 2017-06-14 DIAGNOSIS — F17210 Nicotine dependence, cigarettes, uncomplicated: Secondary | ICD-10-CM

## 2017-06-14 MED ORDER — TRAZODONE HCL 50 MG PO TABS: 50.0000 mg | ORAL_TABLET | Freq: Every day | ORAL | 0 refills | Status: DC

## 2017-06-14 MED ORDER — ALPRAZOLAM 0.5 MG PO TABS: 0.5000 mg | ORAL_TABLET | Freq: Three times a day (TID) | ORAL | 2 refills | Status: DC

## 2017-06-14 MED ORDER — OLANZAPINE 20 MG PO TABS: ORAL_TABLET | ORAL | 0 refills | Status: DC

## 2017-06-14 MED ORDER — LAMOTRIGINE 200 MG PO TABS: 200.0000 mg | ORAL_TABLET | Freq: Every day | ORAL | 0 refills | Status: DC

## 2017-06-14 NOTE — Progress Notes (Signed)
BH MD/PA/NP OP Progress Note  06/14/2017 2:26 PM Jeffrey Herring  MRN:  [id number]  Chief Complaint:  medication management and follow-up.  HPI: Patient came for his follow-up appointment with his wife.  He denies any side effects.  He is taking his medication as prescribed.  Recently he seen his physician and had blood work.  His creatinine is 2.57 which is stable.  His cholesterol is normal.  He has mild elevation of triglycerides.  Overall he described his mood is stable.  He denies any irritability, anger, mania or any psychosis.  His energy level is good.  He had a good summer.  He lost a few pounds and sometime he does not eat as much in the morning.  But he denies any feeling of hopelessness or worthlessness.  He denies any crying spells or any suicidal thoughts.  Patient denies drinking alcohol or using any illegal substances.  He lives with his wife is very supportive.  He wants to continue his current psychiatric medication.  He has no rash, itching, tremors or shakes.  Visit Diagnosis:    ICD-10-CM   1. Bipolar 1 disorder (HCC) F31.9 traZODone (DESYREL) 50 MG tablet    OLANZapine (ZYPREXA) 20 MG tablet    lamoTRIgine (LAMICTAL) 200 MG tablet    ALPRAZolam (XANAX) 0.5 MG tablet    Past Psychiatric History: Reviewed. Patient diagnosed with bipolar disorder and he had psychiatric hospitalization in the past due to mania. He was also seen by consultation liaison services but he was admitted on the medical role due to lithium toxicity. In the past he has taken Abilify, Risperdal, Lexapro, Wellbutrin and Navane. Patient has history of suicidal attempt in the past.  Past Medical History:  Past Medical History:  Diagnosis Date  . Arrhythmia   . Bipolar 1 disorder ([place])   . Depression   . Diabetes insipidus ([place])   . History of DVT (deep vein thrombosis)   . History of kidney stones   . History of pulmonary embolism   . Hyperlipemia   . Lithium toxicity   . Pacemaker   . Stage III  chronic kidney disease     Past Surgical History:  Procedure Laterality Date  . CARDIAC SURGERY    . HERNIA REPAIR Right 02/05/14  . NOSE SURGERY    . PACEMAKER PLACEMENT    . SPINE SURGERY      Family Psychiatric History: Reviewed.  Family History:  Family History  Problem Relation Age of Onset  . Depression Mother     Social History:  Social History   Social History  . Marital status: Married    Spouse name: N/A  . Number of children: N/A  . Years of education: N/A   Social History Main Topics  . Smoking status: Current Every Day Smoker    Packs/day: 2.00    Years: 40.00    Types: Cigars  . Smokeless tobacco: Never Used     Comment: used to smoke cigarettes for 20 years  . Alcohol use No  . Drug use: No  . Sexual activity: No   Other Topics Concern  . 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 Aspirin Hives, Shortness Of Breath and Palpitations  .  Codeine Nausea And Vomiting  . Demerol [Meperidine] Nausea And Vomiting  . Lithium      acute renal failure    Metabolic Disorder Labs: Lab Results  Component Value Date   HGBA1C 5.2 08/04/2016   MPG 103 08/04/2016   No results found for: PROLACTIN Lab Results  Component Value Date   CHOL 172 03/26/2017   TRIG 219 (H) 03/26/2017   HDL 36 (L) 03/26/2017   CHOLHDL 4.8 03/26/2017   VLDL 44 (H) 03/26/2017   LDLCALC 92 03/26/2017   LDLCALC 103 08/04/2016   Lab Results  Component Value Date   TSH 0.644 04/29/2014    Therapeutic Level Labs: Lab Results  Component Value Date   LITHIUM <0.25 (L) 04/28/2014   LITHIUM 0.56 (L) 04/16/2014   No results found for: VALPROATE No components found for:  CBMZ  Current Medications: Current Outpatient Prescriptions  Medication Sig Dispense Refill   . ALPRAZolam (XANAX) 0.5 MG tablet Take 1 tablet (0.5 mg total) by mouth 3 (three) times daily. 90 tablet 2  . cholecalciferol (VITAMIN D) 1000 units tablet Take 1,000 Units by mouth daily.    . clopidogrel (PLAVIX) 75 MG tablet Take 1 tablet (75 mg total) by mouth every morning. 90 tablet 1  . Fluocinolone Acetonide 0.01 % SHAM Apply 1 each topically daily. 120 mL 0  . lamoTRIgine (LAMICTAL) 200 MG tablet Take 1 tablet (200 mg total) by mouth daily. 90 tablet 0  . metoprolol succinate (TOPROL-XL) 25 MG 24 hr tablet Take 0.5 tablets (12.5 mg total) by mouth every morning. 45 tablet 1  . OLANZapine (ZYPREXA) 20 MG tablet TAKE 1 TABLET BY MOUTH ATBEDTIME. 90 tablet 0  . simvastatin (ZOCOR) 20 MG tablet Take 1 tablet (20 mg total) by mouth at bedtime. 90 tablet 1  . tamsulosin (FLOMAX) 0.4 MG CAPS capsule Take 1 capsule (0.4 mg total) by mouth every morning. 90 capsule 1  . traZODone (DESYREL) 50 MG tablet Take 1 tablet (50 mg total) by mouth at bedtime. 90 tablet 0  . triamcinolone lotion (KENALOG) 0.1 % Apply 1 application topically 3 (three) times a week. 60 mL 0  . vitamin C (ASCORBIC ACID) 500 MG tablet Take 500 mg by mouth daily.      No current facility-administered medications for this visit.      Musculoskeletal: Strength & Muscle Tone: within normal limits Gait & Station: normal Patient leans: N/A  Psychiatric Specialty Exam: ROS  Blood pressure 130/88, pulse 90, height 5\' 10"  (1.778 m), weight 177 lb 9.6 oz (80.6 kg).There is no height or weight on file to calculate BMI.  General Appearance: Casual  Eye Contact:  Good  Speech:  Clear and Coherent  Volume:  Normal  Mood:  Euthymic  Affect:  Flat  Thought Process:  Goal Directed  Orientation:  Full (Time, Place, and Person)  Thought Content: Logical   Suicidal Thoughts:  No  Homicidal Thoughts:  No  Memory:  Immediate;   Fair Recent;   Fair Remote;   Fair  Judgement:  Good  Insight:  Good  Psychomotor Activity:   Normal  Concentration:  Concentration: Fair and Attention Span: Fair  Recall:  [employer] of Knowledge: Good  Language: Good  Akathisia:  No  Handed:  Right  AIMS (if indicated): not done  Assets:  Communication Skills Desire for Improvement Housing Resilience Social Support  ADL's:  Intact  Cognition: WNL  Sleep:  Good   Screenings:   Assessment and Plan: Bipolar  disorder type I.  Patient is taking his medication as prescribed.  He is stable on his current psychiatric medication.  He has no tremors shakes or any EPS.  Continue Xanax 0.5 mg 3 times a day, Lamictal 200 mg daily, olanzapine 20 mg at bedtime and trazodone 50 mg at bedtime for sleep.  I review blood work results.  His creatinine is 2.57 is stable.  Recommended to call us back if he is any question or any concern.  Follow-up in 3 months.   Yossef Gilkison T., MD 06/14/2017, 2:26 PM

## 2017-08-02 DIAGNOSIS — Z23 Encounter for immunization: Secondary | ICD-10-CM | POA: Diagnosis not present

## 2017-09-04 DIAGNOSIS — Z45010 Encounter for checking and testing of cardiac pacemaker pulse generator [battery]: Secondary | ICD-10-CM | POA: Diagnosis not present

## 2017-09-04 DIAGNOSIS — Z45018 Encounter for adjustment and management of other part of cardiac pacemaker: Secondary | ICD-10-CM | POA: Diagnosis not present

## 2017-09-14 ENCOUNTER — Encounter (HOSPITAL_COMMUNITY): Payer: Self-pay | Admitting: Psychiatry

## 2017-09-14 ENCOUNTER — Ambulatory Visit (INDEPENDENT_AMBULATORY_CARE_PROVIDER_SITE_OTHER): Payer: Medicare Other | Admitting: Psychiatry

## 2017-09-14 DIAGNOSIS — Z86718 Personal history of other venous thrombosis and embolism: Secondary | ICD-10-CM

## 2017-09-14 DIAGNOSIS — F419 Anxiety disorder, unspecified: Secondary | ICD-10-CM

## 2017-09-14 DIAGNOSIS — F319 Bipolar disorder, unspecified: Secondary | ICD-10-CM

## 2017-09-14 DIAGNOSIS — Z818 Family history of other mental and behavioral disorders: Secondary | ICD-10-CM | POA: Diagnosis not present

## 2017-09-14 DIAGNOSIS — Z79899 Other long term (current) drug therapy: Secondary | ICD-10-CM

## 2017-09-14 DIAGNOSIS — F17290 Nicotine dependence, other tobacco product, uncomplicated: Secondary | ICD-10-CM

## 2017-09-14 MED ORDER — TRAZODONE HCL 50 MG PO TABS: 50.0000 mg | ORAL_TABLET | Freq: Every day | ORAL | 1 refills | Status: DC

## 2017-09-14 MED ORDER — ALPRAZOLAM 0.5 MG PO TABS: 0.5000 mg | ORAL_TABLET | Freq: Three times a day (TID) | ORAL | 5 refills | Status: DC

## 2017-09-14 MED ORDER — OLANZAPINE 20 MG PO TABS: ORAL_TABLET | ORAL | 1 refills | Status: DC

## 2017-09-14 MED ORDER — LAMOTRIGINE 200 MG PO TABS: 200.0000 mg | ORAL_TABLET | Freq: Every day | ORAL | 1 refills | Status: DC

## 2017-09-14 NOTE — Progress Notes (Signed)
BH MD/PA/NP OP Progress Note  09/14/2017 11:40 AM Jeffrey Herring  MRN:  [id number]  Chief Complaint: I am doing good.  My medicine working.   HPI: Patient came for his follow-up appointment with his wife.  He is compliant with medication and feel they are working very well.  He denies any irritability, anger, mania or any psychosis.  His sleep is good.  He had a good Thanksgiving and is hoping to see his daughter on Christmas.  His energy level is good.  Sometimes he gets bored and and due to rather he did not want to go outside.  However after the winter he is thinking to do some home improvement project.  His appetite is okay.  He denies any crying spells, feeling hopelessness or worthlessness.  He denies any suicidal thoughts.  He denies drinking alcohol or using any illegal substances.  He has no tremors shakes or any EPS.  He lives with his wife who is very supportive.  Patient is scheduled to see a nephrologist next month.  His last creatinine was 2.57.  Is also scheduled to have change in his pacemaker in few months.  Visit Diagnosis:    ICD-10-CM   1. Bipolar 1 disorder (HCC) F31.9 ALPRAZolam (XANAX) 0.5 MG tablet    lamoTRIgine (LAMICTAL) 200 MG tablet    OLANZapine (ZYPREXA) 20 MG tablet    traZODone (DESYREL) 50 MG tablet    Past Psychiatric History: Reviewed. Patient has bipolar disorder and he had hospitalization due to mania and psychosis.  Patient has a history of lithium toxicity and in the past he has taken Abilify, Risperdal, Lexapro, Prozac, Wellbutrin and Navane.  Patient has history of suicidal attempt in the past.  Past Medical History:  Past Medical History:  Diagnosis Date  . Arrhythmia   . Bipolar 1 disorder ([place])   . Depression   . Diabetes insipidus ([place])   . History of DVT (deep vein thrombosis)   . History of kidney stones   . History of pulmonary embolism   . Hyperlipemia   . Lithium toxicity   . Pacemaker   . Stage III chronic kidney disease     Past  Surgical History:  Procedure Laterality Date  . CARDIAC SURGERY    . HERNIA REPAIR Right 02/05/14  . NOSE SURGERY    . PACEMAKER PLACEMENT    . SPINE SURGERY      Family Psychiatric History: Reviewed.  Family History:  Family History  Problem Relation Age of Onset  . Depression Mother     Social History:  Social History   Socioeconomic History  . Marital status: Married    Spouse name: Not on file  . Number of children: Not on file  . Years of education: Not on file  . Highest education level: Not on file  Social Needs  . Financial resource strain: Not on file  . Food insecurity - worry: Not on file  . Food insecurity - inability: Not on file  . Transportation needs - medical: Not on file  . Transportation needs - non-medical: Not on file  Occupational History  . Not on file  Tobacco Use  . Smoking status: Current Every Day Smoker    Packs/day: 2.00    Years: 40.00    Pack years: 80.00    Types: Cigars  . Smokeless tobacco: Never Used  . Tobacco comment: used to smoke cigarettes for 20 years  Substance and Sexual Activity  . Alcohol use: No  Alcohol/week: 0.0 oz  . Drug use: No  . Sexual activity: No  Other Topics Concern  . 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 Aspirin Hives, Shortness Of Breath and Palpitations  . Codeine Nausea And Vomiting  . Demerol [Meperidine] Nausea And Vomiting  . Lithium      acute renal failure    Metabolic Disorder Labs: Lab Results  Component Value Date   HGBA1C 5.2 08/04/2016   MPG 103 08/04/2016   No results found for: PROLACTIN Lab Results  Component Value Date   CHOL 172 03/26/2017   TRIG 219 (H) 03/26/2017   HDL 36 (L) 03/26/2017   CHOLHDL 4.8 03/26/2017   VLDL 44 (H) 03/26/2017   LDLCALC 92  03/26/2017   LDLCALC 103 08/04/2016   Lab Results  Component Value Date   TSH 0.644 04/29/2014    Therapeutic Level Labs: Lab Results  Component Value Date   LITHIUM <0.25 (L) 04/28/2014   LITHIUM 0.56 (L) 04/16/2014   No results found for: VALPROATE No components found for:  CBMZ  Current Medications: Current Outpatient Medications  Medication Sig Dispense Refill  . ALPRAZolam (XANAX) 0.5 MG tablet Take 1 tablet (0.5 mg total) by mouth 3 (three) times daily. 90 tablet 2  . cholecalciferol (VITAMIN D) 1000 units tablet Take 1,000 Units by mouth daily.    . clopidogrel (PLAVIX) 75 MG tablet Take 1 tablet (75 mg total) by mouth every morning. 90 tablet 1  . Fluocinolone Acetonide 0.01 % SHAM Apply 1 each topically daily. 120 mL 0  . lamoTRIgine (LAMICTAL) 200 MG tablet Take 1 tablet (200 mg total) by mouth daily. 90 tablet 0  . metoprolol succinate (TOPROL-XL) 25 MG 24 hr tablet Take 0.5 tablets (12.5 mg total) by mouth every morning. 45 tablet 1  . OLANZapine (ZYPREXA) 20 MG tablet TAKE 1 TABLET BY MOUTH ATBEDTIME. 90 tablet 0  . simvastatin (ZOCOR) 20 MG tablet Take 1 tablet (20 mg total) by mouth at bedtime. 90 tablet 1  . tamsulosin (FLOMAX) 0.4 MG CAPS capsule Take 1 capsule (0.4 mg total) by mouth every morning. 90 capsule 1  . traZODone (DESYREL) 50 MG tablet Take 1 tablet (50 mg total) by mouth at bedtime. 90 tablet 0  . triamcinolone lotion (KENALOG) 0.1 % Apply 1 application topically 3 (three) times a week. 60 mL 0  . vitamin C (ASCORBIC ACID) 500 MG tablet Take 500 mg by mouth daily.      No current facility-administered medications for this visit.      Musculoskeletal: Strength & Muscle Tone: within normal limits Gait & Station: normal Patient leans: N/A  Psychiatric Specialty Exam: Review of Systems  Constitutional: Negative.   HENT: Negative.   Musculoskeletal: Negative.   Skin: Negative.   Neurological: Negative.     Blood pressure 128/74, pulse 80,  height 5\' 10"  (1.778 m), weight 180 lb (81.6 kg).There is no height or weight on file to calculate BMI.  General Appearance: Casual  Eye Contact:  Fair  Speech:  Slow  Volume:  Normal  Mood:  Euthymic  Affect:  Appropriate  Thought Process:  Goal Directed  Orientation:  Full (Time, Place, and Person)  Thought Content: Logical  Suicidal Thoughts:  No  Homicidal Thoughts:  No  Memory:  Immediate;   Fair Recent;   Fair Remote;   Fair  Judgement:  Good  Insight:  Good  Psychomotor Activity:  Normal  Concentration:  Concentration: Fair and Attention Span: Fair  Recall:  Good  Fund of Knowledge: Good  Language: Good  Akathisia:  No  Handed:  Right  AIMS (if indicated): not done  Assets:  Communication Skills Desire for Improvement Housing Resilience Social Support  ADL's:  Intact  Cognition: WNL  Sleep:  Good   Screenings:   Assessment and Plan: Older disorder type I.  Anxiety disorder NOS.  Patient is a stable on his current psychiatric medication.  He does not want to change it.  He has no tremors or shakes.  He will see nephrologist next month.  Continue Xanax 0.5 mg 3 times a day, Lamictal 200 mg daily, olanzapine 20 mg at bedtime and trazodone 50 mg at bedtime.  Discussed medication side effects and benefits.  Encourage to engage himself in home-improvement project to avoid boredom.  Discussed healthy lifestyle.  Recommended to call us back if he has any question or any concern.  Follow-up in 6 months.  Recommended to have his blood work results faxed to [place].   [name], MD 09/14/2017, 11:40 AM

## 2017-09-25 ENCOUNTER — Ambulatory Visit: Payer: Medicare Other | Admitting: Family Medicine

## 2017-10-24 ENCOUNTER — Ambulatory Visit (INDEPENDENT_AMBULATORY_CARE_PROVIDER_SITE_OTHER): Payer: Medicare Other | Admitting: Family Medicine

## 2017-10-24 ENCOUNTER — Encounter: Payer: Self-pay | Admitting: Family Medicine

## 2017-10-24 DIAGNOSIS — N184 Chronic kidney disease, stage 4 (severe): Secondary | ICD-10-CM | POA: Diagnosis not present

## 2017-10-24 DIAGNOSIS — F319 Bipolar disorder, unspecified: Secondary | ICD-10-CM | POA: Diagnosis not present

## 2017-10-24 DIAGNOSIS — N138 Other obstructive and reflux uropathy: Secondary | ICD-10-CM | POA: Diagnosis not present

## 2017-10-24 DIAGNOSIS — I2510 Atherosclerotic heart disease of native coronary artery without angina pectoris: Secondary | ICD-10-CM

## 2017-10-24 DIAGNOSIS — I495 Sick sinus syndrome: Secondary | ICD-10-CM

## 2017-10-24 DIAGNOSIS — I10 Essential (primary) hypertension: Secondary | ICD-10-CM

## 2017-10-24 DIAGNOSIS — N401 Enlarged prostate with lower urinary tract symptoms: Secondary | ICD-10-CM

## 2017-10-24 DIAGNOSIS — E782 Mixed hyperlipidemia: Secondary | ICD-10-CM | POA: Diagnosis not present

## 2017-10-24 DIAGNOSIS — I5022 Chronic systolic (congestive) heart failure: Secondary | ICD-10-CM

## 2017-10-24 MED ORDER — SIMVASTATIN 20 MG PO TABS: 20.0000 mg | ORAL_TABLET | Freq: Every day | ORAL | 1 refills | Status: DC

## 2017-10-24 MED ORDER — CLOPIDOGREL BISULFATE 75 MG PO TABS: 75.0000 mg | ORAL_TABLET | Freq: Every morning | ORAL | 1 refills | Status: DC

## 2017-10-24 MED ORDER — TAMSULOSIN HCL 0.4 MG PO CAPS
0.4000 mg | ORAL_CAPSULE | Freq: Every morning | ORAL | 1 refills | Status: DC
Start: 2017-10-24 — End: 2018-04-23

## 2017-10-24 MED ORDER — METOPROLOL SUCCINATE ER 25 MG PO TB24: 12.5000 mg | ORAL_TABLET | Freq: Every morning | ORAL | 1 refills | Status: DC

## 2017-10-24 NOTE — Progress Notes (Signed)
Name: Jeffrey Herring   MRN: [id number]    DOB: 1951-04-19   Date:10/24/2017       Progress Note  Subjective  Chief Complaint  Chief Complaint  Patient presents with  . Medication Refill    6 month F/U  . Hyperlipidemia  . Hypertension    Denies any symptoms  . Manic Behavior  . Congestive Heart Failure    HPI  CHF: he states he has some orthopnea occasionally, but no chest pain or palpitation. He has a pacemaker since 1983, had some vegetation on pacemaker leads back in 2009 and is seeing Dr. Georg Ruddle at [facility] He has one vessel CAD and is on Plavix and Metoprolol , he is allergic to aspirin. He has intermittent SOB with activity.   CKI:  He has been back to see Dr. Johnney Ou - nephrologist. Discussed need to think about long term outcome, HD and living will/power attorney of health care, wife here with him today   Bipolar disorder I: still on Lamictal, Zyprexa, alprazolam  and Trazodone . No recent episodes of mania, he was disabled since age 67 . His hgbA1C was normal in 03/26/2017   Anemia of chronic disease: last labs were normal, eating healthy, will see nephrologist tomorrow and will have lab work done at her office.  BPH: he has urinary frequency, taking Flomax to control symptoms. He has nocturia only once per night, stable  HTN: taking medication, no chest pain or palpitation.   Hyperlipidemia: taking simvastatin and denies myalgias.   Seborrhea: doing better with topical medications   Patient Active Problem List   Diagnosis Date Noted  . Chronic kidney disease, stage 4 (severe) ([place]) 10/24/2017  . BPH (benign prostatic hyperplasia) 11/04/2015  . Chronic kidney disease (CKD), stage III (moderate) (HCC) 10/28/2015  . Chronic systolic heart failure ([place]) 10/28/2015  . HLD (hyperlipidemia) 10/28/2015  . H/O deep venous thrombosis 10/28/2015  . History of sepsis 04/09/2014  . Inguinal hernia 01/06/2014  . Complete atrioventricular block ([place])  10/14/2013  . Sinoatrial node dysfunction ([place]) 10/14/2013  . Kidney cysts 05/06/2013  . Hypertension, benign 10/01/2012  . Healed or old pulmonary embolism 09/30/2012  . Artificial cardiac pacemaker 09/30/2012  . Bipolar 1 disorder ([place]) 05/01/2012  . Arteriosclerosis of coronary artery 06/16/2009    Past Surgical History:  Procedure Laterality Date  . CARDIAC SURGERY    . HERNIA REPAIR Right 02/05/14  . NOSE SURGERY    . PACEMAKER PLACEMENT    . SPINE SURGERY      Family History  Problem Relation Age of Onset  . Depression Mother     Social History   Socioeconomic History  . Marital status: Married    Spouse name: Not on file  . Number of children: Not on file  . Years of education: Not on file  . Highest education level: Not on file  Social Needs  . Financial resource strain: Not on file  . Food insecurity - worry: Not on file  . Food insecurity - inability: Not on file  . Transportation needs - medical: Not on file  . Transportation needs - non-medical: Not on file  Occupational History  . Not on file  Tobacco Use  . Smoking status: Current Every Day Smoker    Packs/day: 2.00    Years: 40.00    Pack years: 80.00    Types: Cigars  . Smokeless tobacco: Never Used  . Tobacco comment: used to smoke cigarettes for 20 years  Substance  and Sexual Activity  . Alcohol use: No    Alcohol/week: 0.0 oz  . Drug use: No  . Sexual activity: No  Other Topics Concern  . 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  ALPRAZolam (XANAX) 0.5 MG tablet, Take 1 tablet (0.5 mg total) by mouth 3 (three) times daily., Disp: 90 tablet, Rfl: 5 .  cholecalciferol (VITAMIN D) 1000 units tablet, Take 1,000 Units by mouth daily., Disp: , Rfl:  .  clopidogrel (PLAVIX) 75 MG tablet, Take 1  tablet (75 mg total) by mouth every morning., Disp: 90 tablet, Rfl: 1 .  lamoTRIgine (LAMICTAL) 200 MG tablet, Take 1 tablet (200 mg total) by mouth daily., Disp: 90 tablet, Rfl: 1 .  metoprolol succinate (TOPROL-XL) 25 MG 24 hr tablet, Take 0.5 tablets (12.5 mg total) by mouth every morning., Disp: 45 tablet, Rfl: 1 .  OLANZapine (ZYPREXA) 20 MG tablet, TAKE 1 TABLET BY MOUTH ATBEDTIME., Disp: 90 tablet, Rfl: 1 .  simvastatin (ZOCOR) 20 MG tablet, Take 1 tablet (20 mg total) by mouth at bedtime., Disp: 90 tablet, Rfl: 1 .  tamsulosin (FLOMAX) 0.4 MG CAPS capsule, Take 1 capsule (0.4 mg total) by mouth every morning., Disp: 90 capsule, Rfl: 1 .  traZODone (DESYREL) 50 MG tablet, Take 1 tablet (50 mg total) by mouth at bedtime., Disp: 90 tablet, Rfl: 1 .  triamcinolone lotion (KENALOG) 0.1 %, Apply 1 application topically 3 (three) times a week., Disp: 60 mL, Rfl: 0 .  Fluocinolone Acetonide 0.01 % SHAM, Apply 1 each topically daily. (Patient not taking: Reported on 10/24/2017), Disp: 120 mL, Rfl: 0 .  vitamin C (ASCORBIC ACID) 500 MG tablet, Take 500 mg by mouth daily. , Disp: , Rfl:   Allergies  Allergen Reactions  . Aspirin Hives, Shortness Of Breath and Palpitations  . Codeine Nausea And Vomiting  . Demerol [Meperidine] Nausea And Vomiting  . Lithium      acute renal failure     ROS  Constitutional: Negative for fever or weight change.  Respiratory: Negative for cough and shortness of breath.   Cardiovascular: Negative for chest pain or palpitations.  Gastrointestinal: Negative for abdominal pain, no bowel changes.  Musculoskeletal: Negative for gait problem or joint swelling.  Skin: Negative for rash.  Neurological: Negative for dizziness or headache.  No other specific complaints in a complete review of systems (except as listed in HPI above).  Objective  Vitals:   10/24/17 1531  BP: 100/62  Pulse: 65  Resp: 16  Temp: 98.4 F (36.9 C)  TempSrc: Oral  SpO2: 94%  Weight:  179 lb 4.8 oz (81.3 kg)  Height: 5\' 10"  (1.778 m)    Body mass index is 25.73 kg/m.  Physical Exam  Constitutional: Patient appears well-developed and well-nourished. Overweight.  No distress.  HEENT: head atraumatic, normocephalic, pupils equal and reactive to light,  neck supple, throat within normal limits Cardiovascular: Normal rate, regular rhythm and normal heart sounds.  No murmur heard. No BLE edema. Pulmonary/Chest: Effort normal and breath sounds normal. No respiratory distress. Abdominal: Soft.  There is no tenderness. Psychiatric: Patient has a normal mood and affect. behavior is normal. Judgment and thought content normal.   PHQ2/9: Depression screen [facility] 2/9  10/24/2017 03/26/2017 09/20/2016 05/05/2016 10/28/2015  Decreased Interest 0 0 0 0 0  Down, Depressed, Hopeless 0 0 0 0 0  PHQ - 2 Score 0 0 0 0 0     Fall Risk: Fall Risk  10/24/2017 03/26/2017 09/20/2016 05/05/2016 10/28/2015  Falls in the past year? No No No No No     Functional Status Survey: Is the patient deaf or have difficulty hearing?: No Does the patient have difficulty seeing, even when wearing glasses/contacts?: No Does the patient have difficulty concentrating, remembering, or making decisions?: No Does the patient have difficulty walking or climbing stairs?: No Does the patient have difficulty dressing or bathing?: No Does the patient have difficulty doing errands alone such as visiting a doctor's office or shopping?: No   Assessment & Plan  1. ASCVD (arteriosclerotic cardiovascular disease)  - clopidogrel (PLAVIX) 75 MG tablet; Take 1 tablet (75 mg total) by mouth every morning.  Dispense: 90 tablet; Refill: 1  2. Sinoatrial node dysfunction (HCC)  - clopidogrel (PLAVIX) 75 MG tablet; Take 1 tablet (75 mg total) by mouth every morning.  Dispense: 90 tablet; Refill: 1  3. Essential hypertension  - metoprolol succinate (TOPROL-XL) 25 MG 24 hr tablet; Take 0.5 tablets (12.5 mg total) by mouth every  morning.  Dispense: 45 tablet; Refill: 1  4. Mixed hyperlipidemia  - simvastatin (ZOCOR) 20 MG tablet; Take 1 tablet (20 mg total) by mouth at bedtime.  Dispense: 90 tablet; Refill: 1  5. Benign prostatic hyperplasia with urinary obstruction  - tamsulosin (FLOMAX) 0.4 MG CAPS capsule; Take 1 capsule (0.4 mg total) by mouth every morning.  Dispense: 90 capsule; Refill: 1  6. Chronic systolic heart failure [facility])  Sees Cardiologist   7. Bipolar 1 disorder ([place])  Continue follow up with psychiatrist   8. Chronic kidney disease, stage 4 (severe) ([place])  Seeing nephrologist

## 2017-10-25 DIAGNOSIS — N183 Chronic kidney disease, stage 3 (moderate): Secondary | ICD-10-CM | POA: Diagnosis not present

## 2017-11-27 ENCOUNTER — Other Ambulatory Visit: Payer: Self-pay | Admitting: Family Medicine

## 2017-11-27 NOTE — Telephone Encounter (Signed)
Refill request for general medication: Triamcinolone cream to [facility].   Last office visit: 10/24/2017  Follow up on 04/23/2018

## 2017-12-20 DIAGNOSIS — I2510 Atherosclerotic heart disease of native coronary artery without angina pectoris: Secondary | ICD-10-CM | POA: Diagnosis not present

## 2017-12-20 DIAGNOSIS — Z45010 Encounter for checking and testing of cardiac pacemaker pulse generator [battery]: Secondary | ICD-10-CM | POA: Diagnosis not present

## 2017-12-20 DIAGNOSIS — I071 Rheumatic tricuspid insufficiency: Secondary | ICD-10-CM | POA: Diagnosis not present

## 2017-12-20 DIAGNOSIS — I5189 Other ill-defined heart diseases: Secondary | ICD-10-CM | POA: Diagnosis not present

## 2017-12-20 DIAGNOSIS — I420 Dilated cardiomyopathy: Secondary | ICD-10-CM | POA: Diagnosis not present

## 2017-12-20 DIAGNOSIS — I10 Essential (primary) hypertension: Secondary | ICD-10-CM | POA: Diagnosis not present

## 2017-12-20 DIAGNOSIS — I442 Atrioventricular block, complete: Secondary | ICD-10-CM | POA: Diagnosis not present

## 2017-12-20 DIAGNOSIS — Z45018 Encounter for adjustment and management of other part of cardiac pacemaker: Secondary | ICD-10-CM | POA: Diagnosis not present

## 2017-12-20 DIAGNOSIS — I517 Cardiomegaly: Secondary | ICD-10-CM | POA: Diagnosis not present

## 2017-12-21 DIAGNOSIS — I5022 Chronic systolic (congestive) heart failure: Secondary | ICD-10-CM | POA: Diagnosis not present

## 2017-12-21 DIAGNOSIS — Z01818 Encounter for other preprocedural examination: Secondary | ICD-10-CM | POA: Diagnosis not present

## 2017-12-21 DIAGNOSIS — Z885 Allergy status to narcotic agent status: Secondary | ICD-10-CM | POA: Diagnosis not present

## 2017-12-21 DIAGNOSIS — I420 Dilated cardiomyopathy: Secondary | ICD-10-CM | POA: Diagnosis not present

## 2017-12-21 DIAGNOSIS — I82290 Acute embolism and thrombosis of other thoracic veins: Secondary | ICD-10-CM | POA: Diagnosis not present

## 2017-12-21 DIAGNOSIS — I442 Atrioventricular block, complete: Secondary | ICD-10-CM | POA: Diagnosis not present

## 2017-12-21 DIAGNOSIS — Z7902 Long term (current) use of antithrombotics/antiplatelets: Secondary | ICD-10-CM | POA: Diagnosis not present

## 2017-12-21 DIAGNOSIS — M1990 Unspecified osteoarthritis, unspecified site: Secondary | ICD-10-CM | POA: Diagnosis not present

## 2017-12-21 DIAGNOSIS — D696 Thrombocytopenia, unspecified: Secondary | ICD-10-CM | POA: Diagnosis not present

## 2017-12-21 DIAGNOSIS — I2510 Atherosclerotic heart disease of native coronary artery without angina pectoris: Secondary | ICD-10-CM | POA: Diagnosis not present

## 2017-12-21 DIAGNOSIS — E785 Hyperlipidemia, unspecified: Secondary | ICD-10-CM | POA: Diagnosis not present

## 2017-12-21 DIAGNOSIS — I82B13 Acute embolism and thrombosis of subclavian vein, bilateral: Secondary | ICD-10-CM | POA: Diagnosis not present

## 2017-12-21 DIAGNOSIS — N183 Chronic kidney disease, stage 3 (moderate): Secondary | ICD-10-CM | POA: Diagnosis not present

## 2017-12-21 DIAGNOSIS — Z79899 Other long term (current) drug therapy: Secondary | ICD-10-CM | POA: Diagnosis not present

## 2017-12-21 DIAGNOSIS — Z886 Allergy status to analgesic agent status: Secondary | ICD-10-CM | POA: Diagnosis not present

## 2017-12-21 DIAGNOSIS — Z950 Presence of cardiac pacemaker: Secondary | ICD-10-CM | POA: Diagnosis not present

## 2017-12-21 DIAGNOSIS — I871 Compression of vein: Secondary | ICD-10-CM | POA: Diagnosis not present

## 2017-12-21 DIAGNOSIS — I130 Hypertensive heart and chronic kidney disease with heart failure and stage 1 through stage 4 chronic kidney disease, or unspecified chronic kidney disease: Secondary | ICD-10-CM | POA: Diagnosis not present

## 2017-12-21 DIAGNOSIS — F319 Bipolar disorder, unspecified: Secondary | ICD-10-CM | POA: Diagnosis not present

## 2017-12-31 DIAGNOSIS — Z45010 Encounter for checking and testing of cardiac pacemaker pulse generator [battery]: Secondary | ICD-10-CM | POA: Diagnosis not present

## 2017-12-31 DIAGNOSIS — I442 Atrioventricular block, complete: Secondary | ICD-10-CM | POA: Diagnosis not present

## 2017-12-31 DIAGNOSIS — R0602 Shortness of breath: Secondary | ICD-10-CM | POA: Diagnosis not present

## 2017-12-31 DIAGNOSIS — I2510 Atherosclerotic heart disease of native coronary artery without angina pectoris: Secondary | ICD-10-CM | POA: Diagnosis not present

## 2017-12-31 DIAGNOSIS — I10 Essential (primary) hypertension: Secondary | ICD-10-CM | POA: Diagnosis not present

## 2017-12-31 DIAGNOSIS — I5022 Chronic systolic (congestive) heart failure: Secondary | ICD-10-CM | POA: Diagnosis not present

## 2018-01-11 DIAGNOSIS — I442 Atrioventricular block, complete: Secondary | ICD-10-CM | POA: Diagnosis not present

## 2018-01-11 DIAGNOSIS — I5022 Chronic systolic (congestive) heart failure: Secondary | ICD-10-CM | POA: Diagnosis not present

## 2018-01-11 DIAGNOSIS — R0602 Shortness of breath: Secondary | ICD-10-CM | POA: Diagnosis not present

## 2018-01-11 DIAGNOSIS — Z45010 Encounter for checking and testing of cardiac pacemaker pulse generator [battery]: Secondary | ICD-10-CM | POA: Diagnosis not present

## 2018-01-11 DIAGNOSIS — I110 Hypertensive heart disease with heart failure: Secondary | ICD-10-CM | POA: Diagnosis not present

## 2018-01-11 DIAGNOSIS — E119 Type 2 diabetes mellitus without complications: Secondary | ICD-10-CM | POA: Diagnosis not present

## 2018-01-14 DIAGNOSIS — I420 Dilated cardiomyopathy: Secondary | ICD-10-CM | POA: Diagnosis not present

## 2018-01-14 DIAGNOSIS — I130 Hypertensive heart and chronic kidney disease with heart failure and stage 1 through stage 4 chronic kidney disease, or unspecified chronic kidney disease: Secondary | ICD-10-CM | POA: Diagnosis not present

## 2018-01-14 DIAGNOSIS — I442 Atrioventricular block, complete: Secondary | ICD-10-CM | POA: Diagnosis not present

## 2018-01-14 DIAGNOSIS — E785 Hyperlipidemia, unspecified: Secondary | ICD-10-CM | POA: Diagnosis not present

## 2018-01-14 DIAGNOSIS — E232 Diabetes insipidus: Secondary | ICD-10-CM | POA: Diagnosis not present

## 2018-01-14 DIAGNOSIS — R0602 Shortness of breath: Secondary | ICD-10-CM | POA: Diagnosis not present

## 2018-01-14 DIAGNOSIS — F319 Bipolar disorder, unspecified: Secondary | ICD-10-CM | POA: Diagnosis not present

## 2018-01-14 DIAGNOSIS — E1322 Other specified diabetes mellitus with diabetic chronic kidney disease: Secondary | ICD-10-CM | POA: Diagnosis not present

## 2018-01-14 DIAGNOSIS — N183 Chronic kidney disease, stage 3 (moderate): Secondary | ICD-10-CM | POA: Diagnosis not present

## 2018-01-14 DIAGNOSIS — E1365 Other specified diabetes mellitus with hyperglycemia: Secondary | ICD-10-CM | POA: Diagnosis not present

## 2018-01-14 DIAGNOSIS — I2510 Atherosclerotic heart disease of native coronary artery without angina pectoris: Secondary | ICD-10-CM | POA: Diagnosis not present

## 2018-01-14 DIAGNOSIS — Z86711 Personal history of pulmonary embolism: Secondary | ICD-10-CM | POA: Diagnosis not present

## 2018-01-14 DIAGNOSIS — Z79899 Other long term (current) drug therapy: Secondary | ICD-10-CM | POA: Diagnosis not present

## 2018-01-14 DIAGNOSIS — Z888 Allergy status to other drugs, medicaments and biological substances status: Secondary | ICD-10-CM | POA: Diagnosis not present

## 2018-01-14 DIAGNOSIS — D631 Anemia in chronic kidney disease: Secondary | ICD-10-CM | POA: Diagnosis not present

## 2018-01-14 DIAGNOSIS — Z885 Allergy status to narcotic agent status: Secondary | ICD-10-CM | POA: Diagnosis not present

## 2018-01-14 DIAGNOSIS — F17290 Nicotine dependence, other tobacco product, uncomplicated: Secondary | ICD-10-CM | POA: Diagnosis not present

## 2018-01-14 DIAGNOSIS — Z45010 Encounter for checking and testing of cardiac pacemaker pulse generator [battery]: Secondary | ICD-10-CM | POA: Diagnosis not present

## 2018-01-14 DIAGNOSIS — I5022 Chronic systolic (congestive) heart failure: Secondary | ICD-10-CM | POA: Diagnosis not present

## 2018-02-23 DIAGNOSIS — L7682 Other postprocedural complications of skin and subcutaneous tissue: Secondary | ICD-10-CM | POA: Diagnosis not present

## 2018-02-23 DIAGNOSIS — R238 Other skin changes: Secondary | ICD-10-CM | POA: Diagnosis not present

## 2018-02-23 DIAGNOSIS — L259 Unspecified contact dermatitis, unspecified cause: Secondary | ICD-10-CM | POA: Diagnosis not present

## 2018-03-07 ENCOUNTER — Ambulatory Visit (INDEPENDENT_AMBULATORY_CARE_PROVIDER_SITE_OTHER): Payer: Medicare Other | Admitting: Psychiatry

## 2018-03-07 ENCOUNTER — Encounter (HOSPITAL_COMMUNITY): Payer: Self-pay | Admitting: Psychiatry

## 2018-03-07 DIAGNOSIS — Z950 Presence of cardiac pacemaker: Secondary | ICD-10-CM | POA: Diagnosis not present

## 2018-03-07 DIAGNOSIS — Z818 Family history of other mental and behavioral disorders: Secondary | ICD-10-CM

## 2018-03-07 DIAGNOSIS — Z87891 Personal history of nicotine dependence: Secondary | ICD-10-CM | POA: Diagnosis not present

## 2018-03-07 DIAGNOSIS — I442 Atrioventricular block, complete: Secondary | ICD-10-CM | POA: Diagnosis not present

## 2018-03-07 DIAGNOSIS — F319 Bipolar disorder, unspecified: Secondary | ICD-10-CM

## 2018-03-07 DIAGNOSIS — F419 Anxiety disorder, unspecified: Secondary | ICD-10-CM

## 2018-03-07 DIAGNOSIS — I2510 Atherosclerotic heart disease of native coronary artery without angina pectoris: Secondary | ICD-10-CM | POA: Diagnosis not present

## 2018-03-07 MED ORDER — LAMOTRIGINE 200 MG PO TABS: 200.0000 mg | ORAL_TABLET | Freq: Every day | ORAL | 1 refills | Status: DC

## 2018-03-07 MED ORDER — ALPRAZOLAM 0.5 MG PO TABS: 0.5000 mg | ORAL_TABLET | Freq: Three times a day (TID) | ORAL | 5 refills | Status: DC

## 2018-03-07 MED ORDER — TRAZODONE HCL 50 MG PO TABS: 50.0000 mg | ORAL_TABLET | Freq: Every day | ORAL | 1 refills | Status: DC

## 2018-03-07 MED ORDER — OLANZAPINE 20 MG PO TABS: ORAL_TABLET | ORAL | 1 refills | Status: DC

## 2018-03-07 NOTE — Progress Notes (Signed)
BH MD/PA/NP OP Progress Note  03/07/2018 2:03 PM Jeffrey Herring  MRN:  [id number]  Chief Complaint: I am doing good.  I am taking my medication.  I am sleeping good.  HPI: Patient came for his follow-up appointment with his wife.  He is compliant with medication and denies any side effects.  He had a pacemaker in April which is working very well.  He feels sometimes anxious but overall his sleep is good.  He denies any irritability, anger, mania, psychosis.  His attention and concentration sometimes distracted but his energy level is good.  Recently he has seen his physician and he had blood work.  His creatinine is 2.30 which is stable.  His glucose is slightly increased and he also gained weight from the past.  Patient like to continue current psychiatric medication.  He denies any crying spells, irritability or any feeling of hopelessness or worthlessness.  He is not drinking or using any illegal substances.  Visit Diagnosis:    ICD-10-CM   1. Bipolar 1 disorder (HCC) F31.9 traZODone (DESYREL) 50 MG tablet    OLANZapine (ZYPREXA) 20 MG tablet    lamoTRIgine (LAMICTAL) 200 MG tablet    ALPRAZolam (XANAX) 0.5 MG tablet    Past Psychiatric History: Reviewed. Patient has bipolar disorder and he had hospitalization due to mania and psychosis.  Patient has a history of lithium toxicity and in the past he has taken Abilify, Risperdal, Lexapro, Prozac, Wellbutrin and Navane.  Patient has history of suicidal attempt in the past  Past Medical History:  Past Medical History:  Diagnosis Date  . Arrhythmia   . Bipolar 1 disorder ([place])   . Depression   . Diabetes insipidus ([place])   . History of DVT (deep vein thrombosis)   . History of kidney stones   . History of pulmonary embolism   . Hyperlipemia   . Lithium toxicity   . Pacemaker   . Stage III chronic kidney disease [facility])     Past Surgical History:  Procedure Laterality Date  . CARDIAC SURGERY    . HERNIA REPAIR Right 02/05/14  . NOSE  SURGERY    . PACEMAKER PLACEMENT    . SPINE SURGERY      Family Psychiatric History: Reviewed.  Family History:  Family History  Problem Relation Age of Onset  . Depression Mother     Social History:  Social History   Socioeconomic History  . Marital status: Married    Spouse name: Not on file  . Number of children: Not on file  . Years of education: Not on file  . Highest education level: Not on file  Occupational History  . Not on file  Social Needs  . Financial resource strain: Not on file  . Food insecurity:    Worry: Not on file    Inability: Not on file  . Transportation needs:    Medical: Not on file    Non-medical: Not on file  Tobacco Use  . Smoking status: Former Smoker    Packs/day: 2.00    Years: 40.00    Pack years: 80.00    Types: Cigars    Last attempt to quit: 02/05/2018    Years since quitting: 0.0  . Smokeless tobacco: Never Used  . Tobacco comment: used to smoke cigarettes for 20 years  Substance and Sexual Activity  . Alcohol use: No    Alcohol/week: 0.0 oz  . Drug use: No  . Sexual activity: Never  Lifestyle  .  Physical activity:    Days per week: Not on file    Minutes per session: Not on file  . Stress: Not on file  Relationships  . Social connections:    Talks on phone: Not on file    Gets together: Not on file    Attends religious service: Not on file    Active member of club or organization: Not on file    Attends meetings of clubs or organizations: Not on file    Relationship status: Not on file  Other Topics Concern  . 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 Aspirin Hives, Shortness Of Breath and Palpitations  . Codeine Nausea And Vomiting  . Demerol [Meperidine] Nausea And Vomiting  . Lithium      acute renal  failure    Metabolic Disorder Labs: Lab Results  Component Value Date   HGBA1C 5.2 08/04/2016   MPG 103 08/04/2016   No results found for: PROLACTIN Lab Results  Component Value Date   CHOL 172 03/26/2017   TRIG 219 (H) 03/26/2017   HDL 36 (L) 03/26/2017   CHOLHDL 4.8 03/26/2017   VLDL 44 (H) 03/26/2017   LDLCALC 92 03/26/2017   LDLCALC 103 08/04/2016   Lab Results  Component Value Date   TSH 0.644 04/29/2014    Therapeutic Level Labs: Lab Results  Component Value Date   LITHIUM <0.25 (L) 04/28/2014   LITHIUM 0.56 (L) 04/16/2014   No results found for: VALPROATE No components found for:  CBMZ  Current Medications: Current Outpatient Medications  Medication Sig Dispense Refill  . ALPRAZolam (XANAX) 0.5 MG tablet Take 1 tablet (0.5 mg total) by mouth 3 (three) times daily. 90 tablet 5  . cholecalciferol (VITAMIN D) 1000 units tablet Take 1,000 Units by mouth daily.    . clopidogrel (PLAVIX) 75 MG tablet Take 1 tablet (75 mg total) by mouth every morning. 90 tablet 1  . lamoTRIgine (LAMICTAL) 200 MG tablet Take 1 tablet (200 mg total) by mouth daily. 90 tablet 1  . metoprolol succinate (TOPROL-XL) 25 MG 24 hr tablet Take 0.5 tablets (12.5 mg total) by mouth every morning. 45 tablet 1  . OLANZapine (ZYPREXA) 20 MG tablet TAKE 1 TABLET BY MOUTH ATBEDTIME. 90 tablet 1  . simvastatin (ZOCOR) 20 MG tablet Take 1 tablet (20 mg total) by mouth at bedtime. 90 tablet 1  . tamsulosin (FLOMAX) 0.4 MG CAPS capsule Take 1 capsule (0.4 mg total) by mouth every morning. 90 capsule 1  . traZODone (DESYREL) 50 MG tablet Take 1 tablet (50 mg total) by mouth at bedtime. 90 tablet 1  . triamcinolone lotion (KENALOG) 0.1 % APPLY 1 APPLICATION TOPICALLY 3 TIMES A WEEK. 60 mL 0  . vitamin C (ASCORBIC ACID) 500 MG tablet Take 500 mg by mouth daily.     . Fluocinolone Acetonide 0.01 % SHAM Apply 1 each topically daily. (Patient not taking: Reported on 10/24/2017) 120 mL 0   No current  facility-administered medications for this visit.      Musculoskeletal: Strength & Muscle Tone: within normal limits Gait & Station: normal Patient leans: N/A  Psychiatric Specialty Exam: ROS  Blood pressure 128/76, pulse 95, height 5\' 10"  (1.778 m), weight 185  lb (83.9 kg).Body mass index is 26.54 kg/m.  General Appearance: Casual  Eye Contact:  Fair  Speech:  Slow  Volume:  Decreased  Mood:  Anxious  Affect:  Appropriate  Thought Process:  Goal Directed  Orientation:  Full (Time, Place, and Person)  Thought Content: Logical   Suicidal Thoughts:  No  Homicidal Thoughts:  No  Memory:  Immediate;   Fair Recent;   Fair Remote;   Fair  Judgement:  Good  Insight:  Present  Psychomotor Activity:  Normal  Concentration:  Concentration: Poor and Attention Span: Poor  Recall:  [place] of Knowledge: Good  Language: Good  Akathisia:  No  Handed:  Right  AIMS (if indicated): not done  Assets:  Communication Skills Desire for Improvement Housing Resilience Social Support  ADL's:  Intact  Cognition: WNL  Sleep:  Good   Screenings:   Assessment and Plan: Bipolar disorder type I.  Anxiety disorder NOS.  Patient doing better on his current medication.  I reviewed blood work results from his physician.  His creatinine is 2.3 which is a stable.  Discussed weight gain and recommended to have his calorie intake watched and do regular exercise.  Continue olanzapine 20 mg daily, Lamictal 200 mg daily, Xanax 0.5 mg 3 times a day and trazodone 50 mg at bedtime.  Recommended to call us back if he has any question, concern for feel worsening of the symptoms.  Follow-up in 6 months.   [name], MD 03/07/2018, 2:03 PM

## 2018-03-14 ENCOUNTER — Ambulatory Visit (HOSPITAL_COMMUNITY): Payer: Self-pay | Admitting: Psychiatry

## 2018-04-23 ENCOUNTER — Ambulatory Visit (INDEPENDENT_AMBULATORY_CARE_PROVIDER_SITE_OTHER): Payer: Medicare Other | Admitting: Family Medicine

## 2018-04-23 ENCOUNTER — Encounter: Payer: Self-pay | Admitting: Family Medicine

## 2018-04-23 ENCOUNTER — Ambulatory Visit (INDEPENDENT_AMBULATORY_CARE_PROVIDER_SITE_OTHER): Payer: Medicare Other

## 2018-04-23 VITALS — BP 110/62 | HR 64 | Temp 98.0°F | Resp 14 | Ht 70.0 in | Wt 186.3 lb

## 2018-04-23 VITALS — BP 110/62 | HR 64 | Temp 98.2°F | Resp 14 | Ht 70.0 in | Wt 186.3 lb

## 2018-04-23 DIAGNOSIS — F319 Bipolar disorder, unspecified: Secondary | ICD-10-CM | POA: Diagnosis not present

## 2018-04-23 DIAGNOSIS — R739 Hyperglycemia, unspecified: Secondary | ICD-10-CM

## 2018-04-23 DIAGNOSIS — N401 Enlarged prostate with lower urinary tract symptoms: Secondary | ICD-10-CM

## 2018-04-23 DIAGNOSIS — I2510 Atherosclerotic heart disease of native coronary artery without angina pectoris: Secondary | ICD-10-CM | POA: Diagnosis not present

## 2018-04-23 DIAGNOSIS — E782 Mixed hyperlipidemia: Secondary | ICD-10-CM | POA: Diagnosis not present

## 2018-04-23 DIAGNOSIS — N138 Other obstructive and reflux uropathy: Secondary | ICD-10-CM | POA: Diagnosis not present

## 2018-04-23 DIAGNOSIS — Z0000 Encounter for general adult medical examination without abnormal findings: Secondary | ICD-10-CM

## 2018-04-23 DIAGNOSIS — N184 Chronic kidney disease, stage 4 (severe): Secondary | ICD-10-CM | POA: Diagnosis not present

## 2018-04-23 DIAGNOSIS — I5022 Chronic systolic (congestive) heart failure: Secondary | ICD-10-CM | POA: Diagnosis not present

## 2018-04-23 DIAGNOSIS — I495 Sick sinus syndrome: Secondary | ICD-10-CM

## 2018-04-23 DIAGNOSIS — L210 Seborrhea capitis: Secondary | ICD-10-CM

## 2018-04-23 DIAGNOSIS — I10 Essential (primary) hypertension: Secondary | ICD-10-CM

## 2018-04-23 MED ORDER — FLUOCINOLONE ACETONIDE 0.01 % EX SOLN: Freq: Two times a day (BID) | CUTANEOUS | 0 refills | Status: DC

## 2018-04-23 MED ORDER — TAMSULOSIN HCL 0.4 MG PO CAPS: 0.4000 mg | ORAL_CAPSULE | Freq: Every morning | ORAL | 1 refills | Status: DC

## 2018-04-23 MED ORDER — METOPROLOL SUCCINATE ER 25 MG PO TB24: 12.5000 mg | ORAL_TABLET | Freq: Every morning | ORAL | 1 refills | Status: DC

## 2018-04-23 MED ORDER — CLOPIDOGREL BISULFATE 75 MG PO TABS: 75.0000 mg | ORAL_TABLET | Freq: Every morning | ORAL | 1 refills | Status: DC

## 2018-04-23 MED ORDER — SIMVASTATIN 20 MG PO TABS: 20.0000 mg | ORAL_TABLET | Freq: Every day | ORAL | 1 refills | Status: DC

## 2018-04-23 NOTE — Patient Instructions (Signed)
[name] , Thank you for taking time to come for your Medicare Wellness Visit. I appreciate your ongoing commitment to your health goals. Please review the following plan we discussed and let me know if I can assist you in the future.   Screening recommendations/referrals: Colorectal Screening: Up to date Lung Cancer Screening: You do not qualify for this screening Hepatitis C Screening: Declined  Vision and Dental Exams: Recommended annual ophthalmology exams for early detection of glaucoma and other disorders of the eye Recommended annual dental exams for proper oral hygiene  Vaccinations: Influenza vaccine: Up to date Pneumococcal vaccine: Up to date Tdap vaccine: Declined. Please call your insurance company to determine your out of pocket expense. You may also receive this vaccine at your local pharmacy or Health Dept. Shingles vaccine: Please call your insurance company to determine your out of pocket expense for the Shingrix vaccine. You may also receive this vaccine at your local pharmacy or Health Dept.  Advanced directives: Declined  Goals: Recommend to drink at least 6-8 8oz glasses of water per day.  Next appointment: Please schedule your Annual Wellness Visit with your Nurse Health Advisor in one year.  Preventive Care 7 Years and Older, Male Preventive care refers to lifestyle choices and visits with your health care provider that can promote health and wellness. What does preventive care include?  A yearly physical exam. This is also called an annual well check.  Dental exams once or twice a year.  Routine eye exams. Ask your health care provider how often you should have your eyes checked.  Personal lifestyle choices, including:  Daily care of your teeth and gums.  Regular physical activity.  Eating a healthy diet.  Avoiding tobacco and drug use.  Limiting alcohol use.  Practicing safe sex.  Taking low doses of aspirin every day.  Taking vitamin and  mineral supplements as recommended by your health care provider. What happens during an annual well check? The services and screenings done by your health care provider during your annual well check will depend on your age, overall health, lifestyle risk factors, and family history of disease. Counseling  Your health care provider may ask you questions about your:  Alcohol use.  Tobacco use.  Drug use.  Emotional well-being.  Home and relationship well-being.  Sexual activity.  Eating habits.  History of falls.  Memory and ability to understand (cognition).  Work and work Statistician. Screening  You may have the following tests or measurements:  Height, weight, and BMI.  Blood pressure.  Lipid and cholesterol levels. These may be checked every 5 years, or more frequently if you are over 72 years old.  Skin check.  Lung cancer screening. You may have this screening every year starting at age 59 if you have a 30-pack-year history of smoking and currently smoke or have quit within the past 15 years.  Fecal occult blood test (FOBT) of the stool. You may have this test every year starting at age 32.  Flexible sigmoidoscopy or colonoscopy. You may have a sigmoidoscopy every 5 years or a colonoscopy every 10 years starting at age 79.  Prostate cancer screening. Recommendations will vary depending on your family history and other risks.  Hepatitis C blood test.  Hepatitis B blood test.  Sexually transmitted disease (STD) testing.  Diabetes screening. This is done by checking your blood sugar (glucose) after you have not eaten for a while (fasting). You may have this done every 1-3 years.  Abdominal aortic aneurysm (  AAA) screening. You may need this if you are a current or former smoker.  Osteoporosis. You may be screened starting at age 55 if you are at high risk. Talk with your health care provider about your test results, treatment options, and if necessary, the need  for more tests. Vaccines  Your health care provider may recommend certain vaccines, such as:  Influenza vaccine. This is recommended every year.  Tetanus, diphtheria, and acellular pertussis (Tdap, Td) vaccine. You may need a Td booster every 10 years.  Zoster vaccine. You may need this after age 92.  Pneumococcal 13-valent conjugate (PCV13) vaccine. One dose is recommended after age 31.  Pneumococcal polysaccharide (PPSV23) vaccine. One dose is recommended after age 68. Talk to your health care provider about which screenings and vaccines you need and how often you need them. This information is not intended to replace advice given to you by your health care provider. Make sure you discuss any questions you have with your health care provider. Document Released: 10/29/2015 Document Revised: 06/21/2016 Document Reviewed: 08/03/2015 Elsevier Interactive Patient Education  2017 [place] Prevention in the Home Falls can cause injuries. They can happen to people of all ages. There are many things you can do to make your home safe and to help prevent falls. What can I do on the outside of my home?  Regularly fix the edges of walkways and driveways and fix any cracks.  Remove anything that might make you trip as you walk through a door, such as a raised step or threshold.  Trim any bushes or trees on the path to your home.  Use bright outdoor lighting.  Clear any walking paths of anything that might make someone trip, such as rocks or tools.  Regularly check to see if handrails are loose or broken. Make sure that both sides of any steps have handrails.  Any raised decks and porches should have guardrails on the edges.  Have any leaves, snow, or ice cleared regularly.  Use sand or salt on walking paths during winter.  Clean up any spills in your garage right away. This includes oil or grease spills. What can I do in the bathroom?  Use night lights.  Install grab bars  by the toilet and in the tub and shower. Do not use towel bars as grab bars.  Use non-skid mats or decals in the tub or shower.  If you need to sit down in the shower, use a plastic, non-slip stool.  Keep the floor dry. Clean up any water that spills on the floor as soon as it happens.  Remove soap buildup in the tub or shower regularly.  Attach bath mats securely with double-sided non-slip rug tape.  Do not have throw rugs and other things on the floor that can make you trip. What can I do in the bedroom?  Use night lights.  Make sure that you have a light by your bed that is easy to reach.  Do not use any sheets or blankets that are too big for your bed. They should not hang down onto the floor.  Have a firm chair that has side arms. You can use this for support while you get dressed.  Do not have throw rugs and other things on the floor that can make you trip. What can I do in the kitchen?  Clean up any spills right away.  Avoid walking on wet floors.  Keep items that you use a lot in  easy-to-reach places.  If you need to reach something above you, use a strong step stool that has a grab bar.  Keep electrical cords out of the way.  Do not use floor polish or wax that makes floors slippery. If you must use wax, use non-skid floor wax.  Do not have throw rugs and other things on the floor that can make you trip. What can I do with my stairs?  Do not leave any items on the stairs.  Make sure that there are handrails on both sides of the stairs and use them. Fix handrails that are broken or loose. Make sure that handrails are as long as the stairways.  Check any carpeting to make sure that it is firmly attached to the stairs. Fix any carpet that is loose or worn.  Avoid having throw rugs at the top or bottom of the stairs. If you do have throw rugs, attach them to the floor with carpet tape.  Make sure that you have a light switch at the top of the stairs and the  bottom of the stairs. If you do not have them, ask someone to add them for you. What else can I do to help prevent falls?  Wear shoes that:  Do not have high heels.  Have rubber bottoms.  Are comfortable and fit you well.  Are closed at the toe. Do not wear sandals.  If you use a stepladder:  Make sure that it is fully opened. Do not climb a closed stepladder.  Make sure that both sides of the stepladder are locked into place.  Ask someone to hold it for you, if possible.  Clearly mark and make sure that you can see:  Any grab bars or handrails.  First and last steps.  Where the edge of each step is.  Use tools that help you move around (mobility aids) if they are needed. These include:  Canes.  Walkers.  Scooters.  Crutches.  Turn on the lights when you go into a dark area. Replace any light bulbs as soon as they burn out.  Set up your furniture so you have a clear path. Avoid moving your furniture around.  If any of your floors are uneven, fix them.  If there are any pets around you, be aware of where they are.  Review your medicines with your doctor. Some medicines can make you feel dizzy. This can increase your chance of falling. Ask your doctor what other things that you can do to help prevent falls. This information is not intended to replace advice given to you by your health care provider. Make sure you discuss any questions you have with your health care provider. Document Released: 07/29/2009 Document Revised: 03/09/2016 Document Reviewed: 11/06/2014 Elsevier Interactive Patient Education  2017 [employer].

## 2018-04-23 NOTE — Progress Notes (Addendum)
Subjective:   Jeffrey Herring is a 67 y.o. male who presents for Medicare Annual/Subsequent preventive examination.  Review of Systems:  N/A Cardiac Risk Factors include: advanced age (>82men, >28 women);dyslipidemia;hypertension;male gender;sedentary lifestyle     Objective:    Vitals: BP 110/62 (BP Location: Left Arm, Patient Position: Sitting, Cuff Size: Normal)   Pulse 64   Temp 98 F (36.7 C) (Oral)   Resp 14   Ht 5\' 10"  (1.778 m)   Wt 186 lb 4.8 oz (84.5 kg)   SpO2 93%   BMI 26.73 kg/m   Body mass index is 26.73 kg/m.  Advanced Directives 04/23/2018 03/26/2017 09/20/2016 05/05/2016 10/28/2015 09/16/2015 06/17/2015  Does Patient Have a Medical Advance Directive? No No No No No No No  Would patient like information on creating a medical advance directive? Yes (MAU/Ambulatory/Procedural Areas - Information given);No - Patient declined Yes (ED - Information included in AVS) - - - No - patient declined information No - patient declined information  Pre-existing out of facility DNR order (yellow form or pink MOST form) - - - - - - -    Tobacco Social History   Tobacco Use  Smoking Status Former Smoker  . Years: 40.00  . Types: Cigars  . Last attempt to quit: 02/05/2018  . Years since quitting: 0.2  Smokeless Tobacco Never Used  Tobacco Comment   smoking cessation materials not required     Counseling given: No Comment: smoking cessation materials not required  Clinical Intake:  Pre-visit preparation completed: Yes  Pain : No/denies pain   BMI - recorded: 26.54 Nutritional Status: BMI 25 -29 Overweight Nutritional Risks: None Diabetes: No  How often do you need to have someone help you when you read instructions, pamphlets, or other written materials from your doctor or pharmacy?: 1 - Never  Interpreter Needed?: No  Information entered by :: AEversole, LPN  Past Medical History:  Diagnosis Date  . Arrhythmia   . Bipolar 1 disorder ([place])   . Depression   .  Diabetes insipidus ([place])   . History of DVT (deep vein thrombosis)   . History of kidney stones   . History of pulmonary embolism   . Hyperlipemia   . Lithium toxicity   . Pacemaker   . Stage III chronic kidney disease [facility])    Past Surgical History:  Procedure Laterality Date  . CARDIAC SURGERY    . HERNIA REPAIR Right 02/05/14  . NOSE SURGERY    . PACEMAKER PLACEMENT    . SPINE SURGERY     Family History  Problem Relation Age of Onset  . Depression Mother    Social History   Socioeconomic History  . Marital status: Married    Spouse name: Not on file  . Number of children: 2  . Years of education: some college  . Highest education level: 12th grade  Occupational History    Employer: DISABLED  Social Needs  . Financial resource strain: Not hard at all  . Food insecurity:    Worry: Never true    Inability: Never true  . Transportation needs:    Medical: Yes    Non-medical: Yes  Tobacco Use  . Smoking status: Former Smoker    Years: 40.00    Types: Cigars    Last attempt to quit: 02/05/2018    Years since quitting: 0.2  . Smokeless tobacco: Never Used  . Tobacco comment: smoking cessation materials not required  Substance and Sexual Activity  . Alcohol  use: No    Alcohol/week: 0.0 oz  . Drug use: No  . Sexual activity: Never  Lifestyle  . Physical activity:    Days per week: 0 days    Minutes per session: 0 min  . Stress: Not at all  Relationships  . Social connections:    Talks on phone: Patient refused    Gets together: Patient refused    Attends religious service: Patient refused    Active member of club or organization: Patient refused    Attends meetings of clubs or organizations: Patient refused    Relationship status: Married  Other Topics Concern  . 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 ALPRAZolam (XANAX) 0.5 MG tablet Take 1 tablet (0.5 mg total) by mouth 3 (three) times daily.  . cholecalciferol (VITAMIN D) 1000 units tablet Take 1,000 Units by mouth daily.  . clopidogrel (PLAVIX) 75 MG tablet Take 1 tablet (75 mg total) by mouth every morning.  . Fluocinolone Acetonide 0.01 % SHAM Apply 1 each topically daily.  Marland Kitchen lamoTRIgine (LAMICTAL) 200 MG tablet Take 1 tablet (200 mg total) by mouth daily.  . metoprolol succinate (TOPROL-XL) 25 MG 24 hr tablet Take 0.5 tablets (12.5 mg total) by mouth every morning.  Marland Kitchen OLANZapine (ZYPREXA) 20 MG tablet TAKE 1 TABLET BY MOUTH ATBEDTIME.  . simvastatin (ZOCOR) 20 MG tablet Take 1 tablet (20 mg total) by mouth at bedtime.  . tamsulosin (FLOMAX) 0.4 MG CAPS capsule Take 1 capsule (0.4 mg total) by mouth every morning.  . traZODone (DESYREL) 50 MG tablet Take 1 tablet (50 mg total) by mouth at bedtime.  . triamcinolone lotion (KENALOG) 0.1 % APPLY 1 APPLICATION TOPICALLY 3 TIMES A WEEK.  . vitamin C (ASCORBIC ACID) 500 MG tablet Take 500 mg by mouth daily.    No facility-administered encounter medications on file as of 04/23/2018.     Activities of Daily Living In your present state of health, do you have any difficulty performing the following activities: 04/23/2018 10/24/2017  Hearing? N N  Comment denies hearing aids -  Vision? N N  Comment wears eyeglasses -  Difficulty concentrating or making decisions? N N  Walking or climbing stairs? N N  Dressing or bathing? N N  Doing errands, shopping? Y N  Comment wife transports -  Conservation officer, nature and eating ? N -  Comment full upper dentures -  Using the Toilet? N -  In the past six months, have you accidently leaked urine? N -  Do you have problems with loss of bowel control? N -  Managing your Medications? N -  Managing your Finances? N -  Housekeeping or managing your Housekeeping? N -  Some recent data  might be hidden    Patient Care Team: Steele Sizer, MD as PCP - General (Family Medicine) Justin Mend, MD as Attending Physician (Nephrology) Georg Ruddle Ashok Cordia, MD as Referring Physician (Cardiology) [name], MD as Consulting Physician (Psychiatry)   Assessment:   This is a routine wellness examination for [place].  Exercise Activities and Dietary recommendations Current Exercise Habits: The patient does not participate in regular exercise at present, Exercise limited by: None identified  Goals    .  DIET - INCREASE WATER INTAKE     Recommend to drink at least 6-8 8oz glasses of water per day.    . Exercise 3x per week (30 min per time)     Recommend walking three times a week for 20-30 minutes.        Fall Risk Fall Risk  04/23/2018 10/24/2017 03/26/2017 09/20/2016 05/05/2016  Falls in the past year? No No No No No  Risk for fall due to : Impaired vision;Medication side effect;Impaired balance/gait;Other (Comment) - - - -  Risk for fall due to: Comment wears eyeglasses; shuffling gait; Bipolar - - - -   FALL RISK PREVENTION PERTAINING TO HOME: Is your home free of loose throw rugs in walkways, pet beds, electrical cords, etc? Yes Is there adequate lighting in your home to reduce risk of falls?  Yes Are there stairs in or around your home WITH handrails? No stairs  ASSISTIVE DEVICES UTILIZED TO PREVENT FALLS: Use of a cane, walker or w/c? No Grab bars in the bathroom? No  Shower chair or a place to sit while bathing? No An elevated toilet seat or a handicapped toilet? No  Timed Get Up and Go Performed: Yes. Pt ambulated 10 feet within 18 sec. Gait slow, steady, shuffling gait and without the use of an assistive device. No intervention required at this time. Fall risk prevention has been discussed.  Community Resource Referral:  Pt declined my offer to send [employer] Referral to Care Guide for installation of grab bars in the shower, shower chair or an  elevated toilet seat.  Depression Screen PHQ 2/9 Scores 04/23/2018 10/24/2017 03/26/2017 09/20/2016  PHQ - 2 Score 0 0 0 0  PHQ- 9 Score 0 - - -    Cognitive Function     6CIT Screen 04/23/2018 03/26/2017  What Year? 0 points 0 points  What month? 0 points 0 points  What time? 0 points 0 points  Count back from 20 0 points 0 points  Months in reverse 0 points 0 points  Repeat phrase 6 points 0 points  Total Score 6 0    Immunization History  Administered Date(s) Administered  . Influenza, High Dose Seasonal PF 10/28/2015, 09/20/2016, 08/02/2017  . Influenza-Unspecified 07/16/2013, 05/16/2014, 08/02/2017  . Pneumococcal Conjugate-13 10/28/2015  . Pneumococcal Polysaccharide-23 04/23/2014  . Tdap 10/27/2005    Qualifies for Shingles Vaccine? Yes. Due for Shingrix. Education has been provided regarding the importance of this vaccine. Pt has been advised to call his insurance company to determine his out of pocket expense. Advised he may also receive this vaccine at his local pharmacy or Health Dept. Verbalized acceptance and understanding.  Due for Tdap vaccine. Education has been provided regarding the importance of this vaccine. Pt has been advised he may receive this vaccine at his local pharmacy or Health Dept. Also advised to provide a copy of his vaccination record if he chooses to receive this vaccine at his local pharmacy. Verbalized acceptance and understanding.  Screening Tests Health Maintenance  Topic Date Due  . Hepatitis C Screening  08/31/2019 (Originally Jul 27, 1951)  . TETANUS/TDAP  10/16/2026 (Originally 10/28/2015)  . INFLUENZA VACCINE  05/16/2018  . PNA vac Low Risk Adult (2 of 2 - PPSV23) 04/24/2019  . COLONOSCOPY  12/09/2020   Cancer Screenings: Lung: Low Dose CT Chest recommended if Age 54-80 years, 30 pack-year currently smoking OR have quit w/in 15years. Patient does not qualify. Colorectal: Completed 12/09/10. Repeat every 10 years  Additional  Screenings: Hepatitis C  Screening: Declined.      Plan:  I have personally reviewed and addressed the Medicare Annual Wellness questionnaire and have noted the following in the patient's chart:  A. Medical and social history B. Use of alcohol, tobacco or illicit drugs  C. Current medications and supplements D. Functional ability and status E.  Nutritional status F.  Physical activity G. Advance directives H. List of other physicians I.  Hospitalizations, surgeries, and ER visits in previous 12 months J.  [place] such as hearing and vision if needed, cognitive and depression L. Referrals and appointments  In addition, I have reviewed and discussed with patient certain preventive protocols, quality metrics, and best practice recommendations. A written personalized care plan for preventive services as well as general preventive health recommendations were provided to patient.  See attached scanned questionnaire for additional information.   Signed,  Aleatha Borer, LPN Nurse Health Advisor  I have reviewed this encounter including the documentation in this note and/or discussed this patient with the provider, Aleatha Borer, LPN. I am certifying that I agree with the content of this note as supervising physician.  Steele Sizer, MD [facility] 04/23/2018, 2:41 PM

## 2018-04-23 NOTE — Patient Instructions (Signed)
Eucerin, vaseline or niveal lotion mixed with hydrocortisone otc

## 2018-04-23 NOTE — Progress Notes (Signed)
Name: Jeffrey Herring   MRN: [id number]    DOB: Apr 17, 1951   Date:04/23/2018       Progress Note  Subjective  Chief Complaint  Chief Complaint  Patient presents with  . Medication Refill  . Hypertension  . Hyperlipidemia  . Manic Behavior  . Congestive Heart Failure    HPI  CHF: he states he has some orthopnea occasionally, but no chest pain or palpitation. He has a pacemaker first one in 1983, had some vegetation on pacemaker leads back in 2009 and is seeing Dr. Georg Ruddle at [facility], and pacemaker was replaced again 01/2018 He had multiple replacements in between and currently located on abdominal wall because of scar tissue. He has  one vessel CAD and is on Plavix and Metoprolol , he is allergic to aspirin. He has intermittent SOB.   CKI: He was seeing  Dr. Johnney Ou - nephrologist, but she left [facility] and needs to see someone new at the same practice. He has noticed pruritis on his arms and legs and also abdominal area. He is not interested in HD  Bipolar disorder I: still on Lamictal, Zyprexa, alprazolam  and Trazodone . No recent episodes of mania, he was disabled since age 49 . His hgbA1C was normal in 03/26/2017 , but we will recheck it today   Anemia of chronic disease: last labs were normal, eating healthy, he does not like to eat fish now  BPH: he has urinary frequency, taking Flomax to control symptoms. He has nocturia only once per night, stable. Unchanged   HTN: taking medication, no chest pain or palpitation. He has occasional SOB, bp is at goal today   Hyperlipidemia: taking simvastatin and denies myalgias. Recheck level   Seborrhea: medication not helping, cannot afford shampoo.   Rash: started a few months ago, shortly after pacemaker replacement. Larger on right flank, also has a small one on left flank, and a different rash, like bumps on legs and abdominal wall. Itchy skin    Patient Active Problem List   Diagnosis Date Noted  . Chronic  kidney disease, stage 4 (severe) ([place]) 10/24/2017  . BPH (benign prostatic hyperplasia) 11/04/2015  . Chronic kidney disease (CKD), stage III (moderate) (HCC) 10/28/2015  . Chronic systolic heart failure ([place]) 10/28/2015  . HLD (hyperlipidemia) 10/28/2015  . H/O deep venous thrombosis 10/28/2015  . History of sepsis 04/09/2014  . Inguinal hernia 01/06/2014  . Complete atrioventricular block ([place]) 10/14/2013  . Sinoatrial node dysfunction ([place]) 10/14/2013  . Kidney cysts 05/06/2013  . Hypertension, benign 10/01/2012  . Healed or old pulmonary embolism 09/30/2012  . Artificial cardiac pacemaker 09/30/2012  . Bipolar 1 disorder ([place]) 05/01/2012  . Arteriosclerosis of coronary artery 06/16/2009    Past Surgical History:  Procedure Laterality Date  . CARDIAC SURGERY    . HERNIA REPAIR Right 02/05/14  . NOSE SURGERY    . PACEMAKER PLACEMENT    . SPINE SURGERY      Family History  Problem Relation Age of Onset  . Depression Mother     Social History   Socioeconomic History  . Marital status: Married    Spouse name: Not on file  . Number of children: 2  . Years of education: some college  . Highest education level: 12th grade  Occupational History    Employer: DISABLED  Social Needs  . Financial resource strain: Not hard at all  . Food insecurity:    Worry: Never true    Inability: Never true  .  Transportation needs:    Medical: Yes    Non-medical: Yes  Tobacco Use  . Smoking status: Former Smoker    Years: 40.00    Types: Cigars    Last attempt to quit: 02/05/2018    Years since quitting: 0.2  . Smokeless tobacco: Never Used  . Tobacco comment: smoking cessation materials not required  Substance and Sexual Activity  . Alcohol use: No    Alcohol/week: 0.0 oz  . Drug use: No  . Sexual activity: Never  Lifestyle  . Physical activity:    Days per week: 0 days    Minutes per session: 0 min  . Stress: Not at all  Relationships  . Social connections:    Talks on  phone: Patient refused    Gets together: Patient refused    Attends religious service: Patient refused    Active member of club or organization: Patient refused    Attends meetings of clubs or organizations: Patient refused    Relationship status: Married  . Intimate partner violence:    Fear of current or ex partner: No    Emotionally abused: No    Physically abused: No    Forced sexual activity: No  Other Topics Concern  . 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  ALPRAZolam (XANAX) 0.5 MG tablet, Take 1 tablet (0.5 mg total) by mouth 3 (three) times daily., Disp: 90 tablet, Rfl: 5 .  cholecalciferol (VITAMIN D) 1000 units tablet, Take 1,000 Units by mouth daily., Disp: , Rfl:  .  clopidogrel (PLAVIX) 75 MG tablet, Take 1 tablet (75 mg total) by mouth every morning., Disp: 90 tablet, Rfl: 1 .  fluocinolone (SYNALAR) 0.01 % external solution, Apply topically 2 (two) times daily., Disp: 60 mL, Rfl: 0 .  lamoTRIgine (LAMICTAL) 200 MG tablet, Take 1 tablet (200 mg total) by mouth daily., Disp: 90 tablet, Rfl: 1 .  metoprolol succinate (TOPROL-XL) 25 MG 24 hr tablet, Take 0.5 tablets (12.5 mg total) by mouth every morning., Disp: 45 tablet, Rfl: 1 .  OLANZapine (ZYPREXA) 20 MG tablet, TAKE 1 TABLET BY MOUTH ATBEDTIME., Disp: 90 tablet, Rfl: 1 .  simvastatin (ZOCOR) 20 MG tablet, Take 1 tablet (20 mg total) by mouth at bedtime., Disp: 90 tablet, Rfl: 1 .  tamsulosin (FLOMAX) 0.4 MG CAPS capsule, Take 1 capsule (0.4 mg total) by mouth every morning., Disp: 90 capsule, Rfl: 1 .  traZODone (DESYREL) 50 MG tablet, Take 1 tablet (50 mg total) by mouth at bedtime., Disp: 90 tablet, Rfl: 1 .  vitamin C (ASCORBIC ACID) 500 MG tablet, Take 500 mg by mouth daily. , Disp: , Rfl:   Allergies  Allergen  Reactions  . Aspirin Hives, Shortness Of Breath and Palpitations  . Codeine Nausea And Vomiting  . Demerol [Meperidine] Nausea And Vomiting  . Lithium      acute renal failure     ROS  Constitutional: Negative for fever or weight change.  Respiratory: Negative for cough , he has intermittent shortness of breath.   Cardiovascular: Negative for chest pain or palpitations.  Gastrointestinal: Negative for abdominal pain, no bowel changes.  Musculoskeletal: Negative for gait problem or joint swelling.  Skin: positive  for rash, skin is itchy  Neurological: Negative for dizziness or headache.  No other specific complaints in a complete review of systems (except as listed in HPI above).  Objective  Vitals:   04/23/18 1339  BP: 110/62  Pulse: 64  Resp: 14  Temp: 98.2 F (36.8 C)  TempSrc: Oral  SpO2: 98%  Weight: 186 lb 4.8 oz (84.5 kg)  Height: 5\' 10"  (1.778 m)    Body mass index is 26.73 kg/m.  Physical Exam  Constitutional: Patient appears well-developed and well-nourished. Overweight.  No distress.  HEENT: head atraumatic, normocephalic, pupils equal and reactive to light, neck supple, throat within normal limits Cardiovascular: Normal rate, regular rhythm and normal heart sounds.  No murmur heard. No BLE edema. Pulmonary/Chest: Effort normal and breath sounds normal. No respiratory distress. Abdominal: Soft.  There is no tenderness. Skin: large area of redness, dry skin well demarcated on right flank area, a smaller area on left flank and also has seborrhea that is inflamed on his nuchal area  Psychiatric: Patient has a normal mood and affect. behavior is normal. Judgment and thought content normal.  PHQ2/9: Depression screen [facility] 2/9 04/23/2018 10/24/2017 03/26/2017 09/20/2016 05/05/2016  Decreased Interest 0 0 0 0 0  Down, Depressed, Hopeless 0 0 0 0 0  PHQ - 2 Score 0 0 0 0 0  Altered sleeping 0 - - - -  Tired, decreased energy 0 - - - -  Change in appetite 0 - - - -   Feeling bad or failure about yourself  0 - - - -  Trouble concentrating 0 - - - -  Moving slowly or fidgety/restless 0 - - - -  Suicidal thoughts 0 - - - -  PHQ-9 Score 0 - - - -  Difficult doing work/chores Not difficult at all - - - -     Fall Risk: Fall Risk  04/23/2018 10/24/2017 03/26/2017 09/20/2016 05/05/2016  Falls in the past year? No No No No No  Risk for fall due to : Impaired vision;Medication side effect;Impaired balance/gait;Other (Comment) - - - -  Risk for fall due to: Comment wears eyeglasses; shuffling gait; Bipolar - - - -      Assessment & Plan  1. ASCVD (arteriosclerotic cardiovascular disease)  - clopidogrel (PLAVIX) 75 MG tablet; Take 1 tablet (75 mg total) by mouth every morning.  Dispense: 90 tablet; Refill: 1  2. Sinoatrial node dysfunction (HCC)  - clopidogrel (PLAVIX) 75 MG tablet; Take 1 tablet (75 mg total) by mouth every morning.  Dispense: 90 tablet; Refill: 1  3. Essential hypertension  - metoprolol succinate (TOPROL-XL) 25 MG 24 hr tablet; Take 0.5 tablets (12.5 mg total) by mouth every morning.  Dispense: 45 tablet; Refill: 1  4. Mixed hyperlipidemia  - simvastatin (ZOCOR) 20 MG tablet; Take 1 tablet (20 mg total) by mouth at bedtime.  Dispense: 90 tablet; Refill: 1  5. Benign prostatic hyperplasia with urinary obstruction  - tamsulosin (FLOMAX) 0.4 MG CAPS capsule; Take 1 capsule (0.4 mg total) by mouth every morning.  Dispense: 90 capsule; Refill: 1  6. Chronic systolic heart failure (HCC)  Stable at this tiime  7. Bipolar 1 disorder (HCC)  Stable on medication  8. Chronic kidney disease, stage 4 (severe) (HCC)  Needs to follow up with nephrologist, Dr. Johnney Ou left [facility] but he will estabilishe care with another provider  We will check labs  9. Seborrhea capitis in adult  - fluocinolone (SYNALAR) 0.01 % external solution; Apply topically 2 (two) times daily.  Dispense:  60 mL; Refill: 0

## 2018-04-24 LAB — VITAMIN D 25 HYDROXY (VIT D DEFICIENCY, FRACTURES): Vit D, 25-Hydroxy: 43 ng/mL (ref 30–100)

## 2018-04-24 LAB — COMPLETE METABOLIC PANEL WITH GFR
AG Ratio: 1.7 (calc) (ref 1.0–2.5)
ALT: 17 U/L (ref 9–46)
AST: 20 U/L (ref 10–35)
Albumin: 5 g/dL (ref 3.6–5.1)
Alkaline phosphatase (APISO): 76 U/L (ref 40–115)
BUN/Creatinine Ratio: 12 (calc) (ref 6–22)
BUN: 30 mg/dL — ABNORMAL HIGH (ref 7–25)
CO2: 22 mmol/L (ref 20–32)
Calcium: 10.8 mg/dL — ABNORMAL HIGH (ref 8.6–10.3)
Chloride: 105 mmol/L (ref 98–110)
Creat: 2.47 mg/dL — ABNORMAL HIGH (ref 0.70–1.25)
GFR, Est African American: 30 mL/min/{1.73_m2} — ABNORMAL LOW (ref >=60)
GFR, Est Non African American: 26 mL/min/{1.73_m2} — ABNORMAL LOW (ref >=60)
Globulin: 3 g/dL (calc) (ref 1.9–3.7)
Glucose, Bld: 110 mg/dL — ABNORMAL HIGH (ref 65–99)
Potassium: 4.7 mmol/L (ref 3.5–5.3)
Sodium: 139 mmol/L (ref 135–146)
Total Bilirubin: 0.6 mg/dL (ref 0.2–1.2)
Total Protein: 8 g/dL (ref 6.1–8.1)

## 2018-04-24 LAB — CBC WITH DIFFERENTIAL/PLATELET
Basophils Absolute: 83 cells/uL (ref 0–200)
Basophils Relative: 0.9 %
Eosinophils Absolute: 0 cells/uL — ABNORMAL LOW (ref 15–500)
Eosinophils Relative: 0 %
HCT: 48.8 % (ref 38.5–50.0)
Hemoglobin: 16.6 g/dL (ref 13.2–17.1)
Lymphs Abs: 791 cells/uL — ABNORMAL LOW (ref 850–3900)
MCH: 30.5 pg (ref 27.0–33.0)
MCHC: 34 g/dL (ref 32.0–36.0)
MCV: 89.7 fL (ref 80.0–100.0)
MPV: 10.6 fL (ref 7.5–12.5)
Monocytes Relative: 8.5 %
Neutro Abs: 7544 cells/uL (ref 1500–7800)
Neutrophils Relative %: 82 %
Platelets: 183 10*3/uL (ref 140–400)
RBC: 5.44 10*6/uL (ref 4.20–5.80)
RDW: 13.1 % (ref 11.0–15.0)
Total Lymphocyte: 8.6 %
WBC mixed population: 782 cells/uL (ref 200–950)
WBC: 9.2 10*3/uL (ref 3.8–10.8)

## 2018-04-24 LAB — HEMOGLOBIN A1C
Hgb A1c MFr Bld: 5.4 % of total Hgb (ref <5.7)
Mean Plasma Glucose: 108 (calc)
eAG (mmol/L): 6 (calc)

## 2018-04-24 LAB — LIPID PANEL
Cholesterol: 213 mg/dL — ABNORMAL HIGH (ref <200)
HDL: 47 mg/dL (ref >40)
LDL Cholesterol (Calc): 137 mg/dL (calc) — ABNORMAL HIGH
Non-HDL Cholesterol (Calc): 166 mg/dL (calc) — ABNORMAL HIGH (ref <130)
Total CHOL/HDL Ratio: 4.5 (calc) (ref <5.0)
Triglycerides: 156 mg/dL — ABNORMAL HIGH (ref <150)

## 2018-04-24 LAB — PARATHYROID HORMONE, INTACT (NO CA): PTH: 72 pg/mL — ABNORMAL HIGH (ref 14–64)

## 2018-06-11 DIAGNOSIS — I442 Atrioventricular block, complete: Secondary | ICD-10-CM | POA: Diagnosis not present

## 2018-08-07 DIAGNOSIS — Z6824 Body mass index (BMI) 24.0-24.9, adult: Secondary | ICD-10-CM | POA: Diagnosis not present

## 2018-08-07 DIAGNOSIS — N400 Enlarged prostate without lower urinary tract symptoms: Secondary | ICD-10-CM | POA: Diagnosis not present

## 2018-08-07 DIAGNOSIS — N183 Chronic kidney disease, stage 3 (moderate): Secondary | ICD-10-CM | POA: Diagnosis not present

## 2018-08-07 DIAGNOSIS — E785 Hyperlipidemia, unspecified: Secondary | ICD-10-CM | POA: Diagnosis not present

## 2018-08-20 ENCOUNTER — Other Ambulatory Visit (HOSPITAL_COMMUNITY): Payer: Self-pay | Admitting: Psychiatry

## 2018-08-20 DIAGNOSIS — F319 Bipolar disorder, unspecified: Secondary | ICD-10-CM

## 2018-09-03 ENCOUNTER — Encounter (HOSPITAL_COMMUNITY): Payer: Self-pay | Admitting: Psychiatry

## 2018-09-03 ENCOUNTER — Ambulatory Visit (INDEPENDENT_AMBULATORY_CARE_PROVIDER_SITE_OTHER): Payer: Medicare Other | Admitting: Psychiatry

## 2018-09-03 VITALS — BP 131/81 | HR 88 | Ht 70.0 in | Wt 178.0 lb

## 2018-09-03 DIAGNOSIS — F319 Bipolar disorder, unspecified: Secondary | ICD-10-CM | POA: Diagnosis not present

## 2018-09-03 DIAGNOSIS — I2510 Atherosclerotic heart disease of native coronary artery without angina pectoris: Secondary | ICD-10-CM | POA: Diagnosis not present

## 2018-09-03 DIAGNOSIS — F411 Generalized anxiety disorder: Secondary | ICD-10-CM | POA: Diagnosis not present

## 2018-09-03 MED ORDER — ALPRAZOLAM 0.5 MG PO TABS: 0.5000 mg | ORAL_TABLET | Freq: Three times a day (TID) | ORAL | 5 refills | Status: DC

## 2018-09-03 MED ORDER — LAMOTRIGINE 200 MG PO TABS: 200.0000 mg | ORAL_TABLET | Freq: Every day | ORAL | 1 refills | Status: DC

## 2018-09-03 MED ORDER — OLANZAPINE 20 MG PO TABS: ORAL_TABLET | ORAL | 1 refills | Status: DC

## 2018-09-03 NOTE — Progress Notes (Signed)
BH MD/PA/NP OP Progress Note  09/03/2018 2:33 PM Jeffrey Herring  MRN:  [id number]  Chief Complaint: I am doing fine.  Some time I sleeps all day.  HPI: Jeffrey Herring came for his follow-up appointment with his wife.  His wife endorsed that he has been doing okay and there has been no recent agitation, anger, mania or any psychosis but he is sleeping all day.  Recently his blood pressure medicines were adjusted and increase and that might have caused increase sedation.  He feels some time tired and fatigued but denies any irritability.  His attention concentration is fair.  His energy level is fair.  Recently he has blood work and his creatinine is 2.5.  He has chronic renal insufficiency.  His hemoglobin C is normal.  Denies any major panic attack or anxiety attack.  He has no concerns from the medication.  Visit Diagnosis:    ICD-10-CM   1. Bipolar 1 disorder (HCC) F31.9 OLANZapine (ZYPREXA) 20 MG tablet    lamoTRIgine (LAMICTAL) 200 MG tablet    ALPRAZolam (XANAX) 0.5 MG tablet  2. GAD (generalized anxiety disorder) F41.1     Past Psychiatric History: Reviewed. History of bipolar disorder and suicide. Hospitalization because of mania and psychosis.  Good response with lithium until had a lithium toxicity.  Took Abilify, Risperdal, Prozac, Lexapro, Wellbutrin and Navane.  Past Medical History:  Past Medical History:  Diagnosis Date  . Arrhythmia   . Bipolar 1 disorder ([place])   . Depression   . Diabetes insipidus ([place])   . History of DVT (deep vein thrombosis)   . History of kidney stones   . History of pulmonary embolism   . Hyperlipemia   . Lithium toxicity   . Pacemaker   . Stage III chronic kidney disease [facility])     Past Surgical History:  Procedure Laterality Date  . CARDIAC SURGERY    . HERNIA REPAIR Right 02/05/14  . NOSE SURGERY    . PACEMAKER PLACEMENT    . SPINE SURGERY      Family Psychiatric History: Reviewed.  Family History:  Family History  Problem Relation Age  of Onset  . Depression Mother     Social History:  Social History   Socioeconomic History  . Marital status: Married    Spouse name: Not on file  . Number of children: 2  . Years of education: some college  . Highest education level: 12th grade  Occupational History    Employer: DISABLED  Social Needs  . Financial resource strain: Not hard at all  . Food insecurity:    Worry: Never true    Inability: Never true  . Transportation needs:    Medical: Yes    Non-medical: Yes  Tobacco Use  . Smoking status: Former Smoker    Years: 40.00    Types: Cigars    Last attempt to quit: 02/05/2018    Years since quitting: 0.5  . Smokeless tobacco: Never Used  . Tobacco comment: smoking cessation materials not required  Substance and Sexual Activity  . Alcohol use: No    Alcohol/week: 0.0 standard drinks  . Drug use: No  . Sexual activity: Never  Lifestyle  . Physical activity:    Days per week: 0 days    Minutes per session: 0 min  . Stress: Not at all  Relationships  . Social connections:    Talks on phone: Patient refused    Gets together: Patient refused    Attends religious service:  Patient refused    Active member of club or organization: Patient refused    Attends meetings of clubs or organizations: Patient refused    Relationship status: Married  Other Topics Concern  . 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 Aspirin Hives, Shortness Of Breath and Palpitations  . Codeine Nausea And Vomiting  . Demerol [Meperidine] Nausea And Vomiting  . Lithium      acute renal failure    Metabolic Disorder Labs: No results found for this or any previous visit (from the past 2160 hour(s)). Lab Results  Component Value Date   HGBA1C 5.4 04/23/2018   MPG 108  04/23/2018   MPG 103 08/04/2016   No results found for: PROLACTIN Lab Results  Component Value Date   CHOL 213 (H) 04/23/2018   TRIG 156 (H) 04/23/2018   HDL 47 04/23/2018   CHOLHDL 4.5 04/23/2018   VLDL 44 (H) 03/26/2017   LDLCALC 137 (H) 04/23/2018   LDLCALC 92 03/26/2017   Lab Results  Component Value Date   TSH 0.644 04/29/2014    Therapeutic Level Labs: Lab Results  Component Value Date   LITHIUM <0.25 (L) 04/28/2014   LITHIUM 0.56 (L) 04/16/2014   No results found for: VALPROATE No components found for:  CBMZ  Current Medications: Current Outpatient Medications  Medication Sig Dispense Refill  . ALPRAZolam (XANAX) 0.5 MG tablet Take 1 tablet (0.5 mg total) by mouth 3 (three) times daily. 90 tablet 5  . cholecalciferol (VITAMIN D) 1000 units tablet Take 1,000 Units by mouth daily.    . clopidogrel (PLAVIX) 75 MG tablet Take 1 tablet (75 mg total) by mouth every morning. 90 tablet 1  . fluocinolone (SYNALAR) 0.01 % external solution Apply topically 2 (two) times daily. 60 mL 0  . lamoTRIgine (LAMICTAL) 200 MG tablet Take 1 tablet (200 mg total) by mouth daily. 90 tablet 1  . metoprolol succinate (TOPROL-XL) 25 MG 24 hr tablet Take 0.5 tablets (12.5 mg total) by mouth every morning. 45 tablet 1  . OLANZapine (ZYPREXA) 20 MG tablet TAKE 1 TABLET BY MOUTH ATBEDTIME. 90 tablet 1  . simvastatin (ZOCOR) 20 MG tablet Take 1 tablet (20 mg total) by mouth at bedtime. 90 tablet 1  . tamsulosin (FLOMAX) 0.4 MG CAPS capsule Take 1 capsule (0.4 mg total) by mouth every morning. 90 capsule 1  . traZODone (DESYREL) 50 MG tablet Take 1 tablet (50 mg total) by mouth at bedtime. 90 tablet 1  . vitamin C (ASCORBIC ACID) 500 MG tablet Take 500 mg by mouth daily.      No current facility-administered medications for this visit.      Musculoskeletal: Strength & Muscle Tone: within normal limits Gait & Station: normal Patient leans: N/A  Psychiatric Specialty Exam: ROS  Blood  pressure 131/81, pulse 88, height 5\' 10"  (1.778 m), weight 178 lb (80.7 kg), SpO2 91 %.Body mass index is 25.54 kg/m.  General Appearance: Casual  Eye Contact:  Fair  Speech:  Slow  Volume:  Decreased  Mood:  Euthymic  Affect:  Congruent  Thought Process:  Descriptions of Associations: Intact  Orientation:  Full (Time, Place, and Person)  Thought Content: Logical   Suicidal Thoughts:  No  Homicidal Thoughts:  No  Memory:  Immediate;   Fair Recent;   Fair Remote;   Fair  Judgement:  Good  Insight:  Present  Psychomotor Activity:  Decreased  Concentration:  Concentration: Fair and Attention Span: Fair  Recall:  [employer] of Knowledge: Good  Language: Good  Akathisia:  No  Handed:  Right  AIMS (if indicated): not done  Assets:  Communication Skills Desire for Improvement Housing Resilience Social Support  ADL's:  Intact  Cognition: WNL  Sleep:  Too much   Screenings:   Assessment and Plan: Bipolar disorder type I.  Generalized anxiety disorder.  His wife is concerned that he is sleeping too much.  I will discontinue trazodone and recommended to take Zyprexa at bedtime.  He has no rash, itching or tremors with Lamictal.  Continue Lamictal 200 mg daily and Xanax 0.5 mg 3 times a day.  Reviewed blood work results.  His creatinine is 2.47 and BUN 30.  His hemoglobin A 1C is normal.  Recommended to call us back if is any question or any concern.  Follow-up in 6 months.   [name], MD 09/03/2018, 2:33 PM

## 2018-09-09 ENCOUNTER — Ambulatory Visit (HOSPITAL_COMMUNITY): Payer: Self-pay | Admitting: Psychiatry

## 2018-09-17 DIAGNOSIS — I442 Atrioventricular block, complete: Secondary | ICD-10-CM | POA: Diagnosis not present

## 2018-10-25 ENCOUNTER — Ambulatory Visit: Payer: Medicare Other | Admitting: Family Medicine

## 2018-11-04 ENCOUNTER — Other Ambulatory Visit (HOSPITAL_COMMUNITY): Payer: Self-pay | Admitting: Psychiatry

## 2018-11-04 ENCOUNTER — Other Ambulatory Visit: Payer: Self-pay | Admitting: Family Medicine

## 2018-11-04 DIAGNOSIS — I2510 Atherosclerotic heart disease of native coronary artery without angina pectoris: Secondary | ICD-10-CM

## 2018-11-04 DIAGNOSIS — N401 Enlarged prostate with lower urinary tract symptoms: Secondary | ICD-10-CM

## 2018-11-04 DIAGNOSIS — F319 Bipolar disorder, unspecified: Secondary | ICD-10-CM

## 2018-11-04 DIAGNOSIS — E782 Mixed hyperlipidemia: Secondary | ICD-10-CM

## 2018-11-04 DIAGNOSIS — I10 Essential (primary) hypertension: Secondary | ICD-10-CM

## 2018-11-04 DIAGNOSIS — I495 Sick sinus syndrome: Secondary | ICD-10-CM

## 2018-11-04 DIAGNOSIS — N138 Other obstructive and reflux uropathy: Secondary | ICD-10-CM

## 2018-11-04 NOTE — Telephone Encounter (Signed)
Hypertension medication request: Metoprolol to [facility].   Last office visit pertaining to hypertension:04/23/2018   BP Readings from Last 3 Encounters:  05/16/14 149/75  05/11/14 135/61  04/23/14 138/76    Lab Results  Component Value Date   CREATININE 2.47 (H) 04/23/2018   BUN 30 (H) 04/23/2018   NA 139 04/23/2018   K 4.7 04/23/2018   CL 105 04/23/2018   CO2 22 04/23/2018     No follow-ups on file.

## 2018-11-04 NOTE — Telephone Encounter (Signed)
He needs follow up

## 2018-11-05 NOTE — Telephone Encounter (Signed)
LVM on [phone] asking pt to return call to schedule appt. Tried calling [phone] and it was not a working number.

## 2018-11-06 ENCOUNTER — Ambulatory Visit (INDEPENDENT_AMBULATORY_CARE_PROVIDER_SITE_OTHER): Payer: Medicare Other | Admitting: Family Medicine

## 2018-11-06 ENCOUNTER — Encounter: Payer: Self-pay | Admitting: Family Medicine

## 2018-11-06 VITALS — BP 108/80 | HR 100 | Temp 98.0°F | Resp 16 | Ht 70.0 in | Wt 175.3 lb

## 2018-11-06 DIAGNOSIS — N138 Other obstructive and reflux uropathy: Secondary | ICD-10-CM

## 2018-11-06 DIAGNOSIS — J3489 Other specified disorders of nose and nasal sinuses: Secondary | ICD-10-CM | POA: Diagnosis not present

## 2018-11-06 DIAGNOSIS — N401 Enlarged prostate with lower urinary tract symptoms: Secondary | ICD-10-CM | POA: Diagnosis not present

## 2018-11-06 DIAGNOSIS — F319 Bipolar disorder, unspecified: Secondary | ICD-10-CM | POA: Diagnosis not present

## 2018-11-06 DIAGNOSIS — N183 Chronic kidney disease, stage 3 unspecified: Secondary | ICD-10-CM

## 2018-11-06 DIAGNOSIS — I10 Essential (primary) hypertension: Secondary | ICD-10-CM

## 2018-11-06 DIAGNOSIS — E782 Mixed hyperlipidemia: Secondary | ICD-10-CM | POA: Diagnosis not present

## 2018-11-06 DIAGNOSIS — N2581 Secondary hyperparathyroidism of renal origin: Secondary | ICD-10-CM

## 2018-11-06 DIAGNOSIS — I495 Sick sinus syndrome: Secondary | ICD-10-CM | POA: Diagnosis not present

## 2018-11-06 DIAGNOSIS — L309 Dermatitis, unspecified: Secondary | ICD-10-CM | POA: Diagnosis not present

## 2018-11-06 DIAGNOSIS — I5022 Chronic systolic (congestive) heart failure: Secondary | ICD-10-CM

## 2018-11-06 DIAGNOSIS — Z23 Encounter for immunization: Secondary | ICD-10-CM | POA: Diagnosis not present

## 2018-11-06 DIAGNOSIS — R0981 Nasal congestion: Secondary | ICD-10-CM

## 2018-11-06 DIAGNOSIS — I2510 Atherosclerotic heart disease of native coronary artery without angina pectoris: Secondary | ICD-10-CM

## 2018-11-06 MED ORDER — CLOPIDOGREL BISULFATE 75 MG PO TABS: 75.0000 mg | ORAL_TABLET | Freq: Every morning | ORAL | 1 refills | Status: DC

## 2018-11-06 MED ORDER — TRIAMCINOLONE ACETONIDE 0.1 % EX CREA: 1.0000 "application " | TOPICAL_CREAM | Freq: Two times a day (BID) | CUTANEOUS | 0 refills | Status: DC

## 2018-11-06 MED ORDER — METOPROLOL SUCCINATE ER 25 MG PO TB24: 12.5000 mg | ORAL_TABLET | Freq: Every morning | ORAL | 1 refills | Status: DC

## 2018-11-06 MED ORDER — ROSUVASTATIN CALCIUM 20 MG PO TABS: 20.0000 mg | ORAL_TABLET | Freq: Every day | ORAL | 0 refills | Status: DC

## 2018-11-06 MED ORDER — TAMSULOSIN HCL 0.4 MG PO CAPS: 0.4000 mg | ORAL_CAPSULE | Freq: Every morning | ORAL | 1 refills | Status: DC

## 2018-11-06 NOTE — Progress Notes (Signed)
Name: Jeffrey Herring   MRN: [id number]    DOB: 07-Oct-1951   Date:11/06/2018       Progress Note  Subjective  Chief Complaint  Chief Complaint  Patient presents with  . Medication Management    HPI   CHF: he states he has some orthopnea occasionally, he always uses two pillows, he denies  chest pain or palpitation. He has a pacemaker first one in 1983, had some vegetation on pacemaker leads back in 2009 and is seeing Dr. Georg Ruddle at [facility], and pacemaker was replaced again 01/2018 He had multiple replacements in between and currently located on abdominal wall because of scar tissue. He has  one vessel CAD and is on Plavix and Metoprolol now on full pill daily, he is allergic to aspirin.   CKI: He used to see  Dr. Johnney Ou - nephrologist,  Currently seeing Dr. Sydell Axon. He has noticed pruritis on his back. He is not interested in HD  Bipolar disorder I:still on Lamictal,Zyprexa, alprazolam and Trazodone . No recent manic episodes but was confused in Dec when he skipped some doses of Zyprexa , he has been  disabled since age 32 . Last hgbA1C was normal   BPH: he has urinary frequency, taking Flomax to control symptoms. He has nocturia only once per night, stable. Unchanged  HTN: taking medication, no chest pain or palpitation.He has occasional SOB, bp is at goal today . He is very sedentary , watching TV or sleeping during the day   Hyperlipidemia: taking simvastatin and LDL not at goal, willing to change to Crestor. No myalgia   Dermatitis : finger webs, he had on his scalp before but resolve  Rhinorrhea; he uses Afrin daily and has been doing so for months, discussed importance of stopping medication to improve symptoms. Try saline spray   Patient Active Problem List   Diagnosis Date Noted  . Chronic kidney disease, stage 4 (severe) ([place]) 10/24/2017  . Dilated cardiomyopathy ([place]) 02/18/2017  . BPH (benign prostatic hyperplasia) 11/04/2015  . Chronic  kidney disease (CKD), stage III (moderate) (HCC) 10/28/2015  . Chronic systolic heart failure ([place]) 10/28/2015  . HLD (hyperlipidemia) 10/28/2015  . H/O deep venous thrombosis 10/28/2015  . History of sepsis 04/09/2014  . Inguinal hernia 01/06/2014  . Complete atrioventricular block ([place]) 10/14/2013  . Sinoatrial node dysfunction ([place]) 10/14/2013  . Kidney cysts 05/06/2013  . Hypertension, benign 10/01/2012  . Healed or old pulmonary embolism 09/30/2012  . Artificial cardiac pacemaker 09/30/2012  . Bipolar 1 disorder ([place]) 05/01/2012  . Arteriosclerosis of coronary artery 06/16/2009    Past Surgical History:  Procedure Laterality Date  . CARDIAC SURGERY    . HERNIA REPAIR Right 02/05/14  . NOSE SURGERY    . PACEMAKER PLACEMENT    . SPINE SURGERY      Family History  Problem Relation Age of Onset  . Depression Mother     Social History   Socioeconomic History  . Marital status: Married    Spouse name: Not on file  . Number of children: 2  . Years of education: some college  . Highest education level: 12th grade  Occupational History    Employer: DISABLED  Social Needs  . Financial resource strain: Not hard at all  . Food insecurity:    Worry: Never true    Inability: Never true  . Transportation needs:    Medical: Yes    Non-medical: Yes  Tobacco Use  . Smoking status: Former Smoker  Years: 40.00    Types: Cigars    Last attempt to quit: 02/05/2018    Years since quitting: 0.7  . Smokeless tobacco: Never Used  . Tobacco comment: smoking cessation materials not required  Substance and Sexual Activity  . Alcohol use: No    Alcohol/week: 0.0 standard drinks  . Drug use: No  . Sexual activity: Never  Lifestyle  . Physical activity:    Days per week: 0 days    Minutes per session: 0 min  . Stress: Not at all  Relationships  . Social connections:    Talks on phone: Never    Gets together: Once a week    Attends religious service: Never    Active member  of club or organization: No    Attends meetings of clubs or organizations: Never    Relationship status: Married  . Intimate partner violence:    Fear of current or ex partner: No    Emotionally abused: No    Physically abused: No    Forced sexual activity: No  Other Topics Concern  . 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  ALPRAZolam (XANAX) 0.5 MG tablet, Take 1 tablet (0.5 mg total) by mouth 3 (three) times daily., Disp: 90 tablet, Rfl: 5 .  cholecalciferol (VITAMIN D) 1000 units tablet, Take 1,000 Units by mouth daily., Disp: , Rfl:  .  clopidogrel (PLAVIX) 75 MG tablet, Take 1 tablet (75 mg total) by mouth every morning., Disp: 90 tablet, Rfl: 1 .  fluocinolone (SYNALAR) 0.01 % external solution, Apply topically 2 (two) times daily., Disp: 60 mL, Rfl: 0 .  lamoTRIgine (LAMICTAL) 200 MG tablet, Take 1 tablet (200 mg total) by mouth daily., Disp: 90 tablet, Rfl: 1 .  metoprolol succinate (TOPROL-XL) 25 MG 24 hr tablet, Take 0.5 tablets (12.5 mg total) by mouth every morning., Disp: 90 tablet, Rfl: 1 .  OLANZapine (ZYPREXA) 20 MG tablet, TAKE 1 TABLET BY MOUTH ATBEDTIME., Disp: 90 tablet, Rfl: 1 .  tamsulosin (FLOMAX) 0.4 MG CAPS capsule, Take 1 capsule (0.4 mg total) by mouth every morning., Disp: 90 capsule, Rfl: 1 .  vitamin C (ASCORBIC ACID) 500 MG tablet, Take 500 mg by mouth daily. , Disp: , Rfl:  .  rosuvastatin (CRESTOR) 20 MG tablet, Take 1 tablet (20 mg total) by mouth daily., Disp: 90 tablet, Rfl: 0 .  triamcinolone cream (KENALOG) 0.1 %, Apply 1 application topically 2 (two) times daily., Disp: 30 g, Rfl: 0  Allergies  Allergen Reactions  . Aspirin Hives, Shortness Of Breath and Palpitations  . Codeine Nausea And Vomiting  . Demerol [Meperidine] Nausea And Vomiting  .  Lithium      acute renal failure    I personally reviewed active problem list, medication list, allergies, family history, social history with the patient/caregiver today.   ROS  Constitutional: Negative for fever or weight change.  Respiratory: Negative for cough and shortness of breath.   Cardiovascular: Negative for chest pain or palpitations.  Gastrointestinal: Negative for abdominal pain, no bowel changes.  Musculoskeletal: Negative for gait problem or joint swelling.  Skin: Negative for rash.  Neurological: Negative for dizziness or headache.  No other specific complaints in a complete review of systems (  except as listed in HPI above).  Objective  Vitals:   11/06/18 1433  BP: 108/80  Pulse: 100  Resp: 16  Temp: 98 F (36.7 C)  TempSrc: Oral  SpO2: 95%  Weight: 175 lb 4.8 oz (79.5 kg)  Height: 5\' 10"  (1.778 m)    Body mass index is 25.15 kg/m.  Physical Exam  Constitutional: Patient appears well-developed and well-nourished.  No distress. He is pale HEENT: head atraumatic, normocephalic, pupils equal and reactive to light,  neck supple, throat within normal limits, boggy turbinates, clear rhinorrhea Cardiovascular: Normal rate, regular rhythm and normal heart sounds.  No murmur heard. No BLE edema. Pulmonary/Chest: Effort normal and breath sounds normal. No respiratory distress. Abdominal: Soft.  There is no tenderness. Psychiatric: Patient has a flat affect,  behavior is normal. Judgment and thought content normal.  PHQ2/9: Depression screen [facility] 2/9 11/06/2018 04/23/2018 10/24/2017 03/26/2017 09/20/2016  Decreased Interest 0 0 0 0 0  Down, Depressed, Hopeless 0 0 0 0 0  PHQ - 2 Score 0 0 0 0 0  Altered sleeping 3 0 - - -  Tired, decreased energy 3 0 - - -  Change in appetite 0 0 - - -  Feeling bad or failure about yourself  0 0 - - -  Trouble concentrating 0 0 - - -  Moving slowly or fidgety/restless 3 0 - - -  Suicidal thoughts 0 0 - - -  PHQ-9 Score 9 0 - - -   Difficult doing work/chores Somewhat difficult Not difficult at all - - -     Fall Risk: Fall Risk  11/06/2018 04/23/2018 10/24/2017 03/26/2017 09/20/2016  Falls in the past year? 0 No No No No  Number falls in past yr: 0 - - - -  Injury with Fall? 0 - - - -  Risk for fall due to : - Impaired vision;Medication side effect;Impaired balance/gait;Other (Comment) - - -  Risk for fall due to: Comment - wears eyeglasses; shuffling gait; Bipolar - - -     Assessment & Plan  1. ASCVD (arteriosclerotic cardiovascular disease)  - clopidogrel (PLAVIX) 75 MG tablet; Take 1 tablet (75 mg total) by mouth every morning.  Dispense: 90 tablet; Refill: 1 - rosuvastatin (CRESTOR) 20 MG tablet; Take 1 tablet (20 mg total) by mouth daily.  Dispense: 90 tablet; Refill: 0  2. Sinoatrial node dysfunction (HCC)  - clopidogrel (PLAVIX) 75 MG tablet; Take 1 tablet (75 mg total) by mouth every morning.  Dispense: 90 tablet; Refill: 1  3. Flu vaccine need  - Flu vaccine HIGH DOSE PF  4. Essential hypertension  - metoprolol succinate (TOPROL-XL) 25 MG 24 hr tablet; Take 0.5 tablets (12.5 mg total) by mouth every morning.  Dispense: 90 tablet; Refill: 1 - COMPLETE METABOLIC PANEL WITH GFR  5. Benign prostatic hyperplasia with urinary obstruction  - tamsulosin (FLOMAX) 0.4 MG CAPS capsule; Take 1 capsule (0.4 mg total) by mouth every morning.  Dispense: 90 capsule; Refill: 1  6. Chronic kidney disease, stage 3 (moderate) ([place])  Seeing nephrologist yearly   7. Bipolar 1 disorder [facility])  Seeing psychiatrist and is stable  8. Chronic systolic heart failure (HCC)  Discussed checking weight every am   9. Mixed hyperlipidemia  - Lipid panel  10. Hyperparathyroidism, secondary renal ([place])  Recheck next visit   11. Dermatitis  - triamcinolone cream (KENALOG) 0.1 %; Apply 1 application topically 2 (two) times daily.  Dispense: 30 g; Refill: 0   12. Nasal congestion  with rhinorrhea

## 2018-11-07 ENCOUNTER — Other Ambulatory Visit (HOSPITAL_COMMUNITY): Payer: Self-pay

## 2018-11-07 DIAGNOSIS — F319 Bipolar disorder, unspecified: Secondary | ICD-10-CM

## 2018-11-07 MED ORDER — TRAZODONE HCL 50 MG PO TABS: 50.0000 mg | ORAL_TABLET | Freq: Every day | ORAL | 1 refills | Status: DC

## 2018-11-12 DIAGNOSIS — I10 Essential (primary) hypertension: Secondary | ICD-10-CM | POA: Diagnosis not present

## 2018-11-12 DIAGNOSIS — R0683 Snoring: Secondary | ICD-10-CM | POA: Diagnosis not present

## 2018-11-12 DIAGNOSIS — I2510 Atherosclerotic heart disease of native coronary artery without angina pectoris: Secondary | ICD-10-CM | POA: Diagnosis not present

## 2018-11-12 DIAGNOSIS — I442 Atrioventricular block, complete: Secondary | ICD-10-CM | POA: Diagnosis not present

## 2018-11-12 DIAGNOSIS — I420 Dilated cardiomyopathy: Secondary | ICD-10-CM | POA: Diagnosis not present

## 2018-12-24 DIAGNOSIS — I442 Atrioventricular block, complete: Secondary | ICD-10-CM | POA: Diagnosis not present

## 2019-01-23 ENCOUNTER — Other Ambulatory Visit: Payer: Self-pay | Admitting: Family Medicine

## 2019-01-23 DIAGNOSIS — I2510 Atherosclerotic heart disease of native coronary artery without angina pectoris: Secondary | ICD-10-CM

## 2019-01-23 NOTE — Telephone Encounter (Signed)
Refill Request for Cholesterol medication.  Last appt: 11/06/2018   Lab Results  Component Value Date   CHOL 213 (H) 04/23/2018   HDL 47 04/23/2018   LDLCALC 137 (H) 04/23/2018   TRIG 156 (H) 04/23/2018   CHOLHDL 4.5 04/23/2018    Follow up on 04/25/2019

## 2019-02-10 ENCOUNTER — Other Ambulatory Visit: Payer: Self-pay

## 2019-02-10 ENCOUNTER — Telehealth: Payer: Self-pay | Admitting: Family Medicine

## 2019-02-10 DIAGNOSIS — I10 Essential (primary) hypertension: Secondary | ICD-10-CM

## 2019-02-10 MED ORDER — METOPROLOL SUCCINATE ER 25 MG PO TB24: 25.0000 mg | ORAL_TABLET | Freq: Every day | ORAL | 0 refills | Status: DC

## 2019-02-10 NOTE — Telephone Encounter (Signed)
Patients wife called and left a message asking for a call back. She states that there is some confusion with his medicaiton and it is mainly in regards to metoprolol.

## 2019-02-10 NOTE — Telephone Encounter (Signed)
Wife called states that her husband needs a refill on his Metoprolol. She has been giving him 25 mg tabs instead of 12.5. I thought that he should have some refills, but she said that he doesn't. Pharmacist wants to make sure that instructions are correct and do you want him to prescribe more even though he had a 6 month supply already. Please advise.

## 2019-02-18 ENCOUNTER — Other Ambulatory Visit (HOSPITAL_COMMUNITY): Payer: Self-pay | Admitting: Psychiatry

## 2019-02-18 DIAGNOSIS — I5022 Chronic systolic (congestive) heart failure: Secondary | ICD-10-CM | POA: Diagnosis not present

## 2019-02-18 DIAGNOSIS — I2510 Atherosclerotic heart disease of native coronary artery without angina pectoris: Secondary | ICD-10-CM | POA: Diagnosis not present

## 2019-02-18 DIAGNOSIS — I10 Essential (primary) hypertension: Secondary | ICD-10-CM | POA: Diagnosis not present

## 2019-02-18 DIAGNOSIS — E7800 Pure hypercholesterolemia, unspecified: Secondary | ICD-10-CM | POA: Diagnosis not present

## 2019-02-18 DIAGNOSIS — I420 Dilated cardiomyopathy: Secondary | ICD-10-CM | POA: Diagnosis not present

## 2019-02-18 DIAGNOSIS — Z950 Presence of cardiac pacemaker: Secondary | ICD-10-CM | POA: Diagnosis not present

## 2019-02-18 DIAGNOSIS — F319 Bipolar disorder, unspecified: Secondary | ICD-10-CM

## 2019-03-04 ENCOUNTER — Ambulatory Visit (INDEPENDENT_AMBULATORY_CARE_PROVIDER_SITE_OTHER): Payer: Medicare Other | Admitting: Psychiatry

## 2019-03-04 ENCOUNTER — Other Ambulatory Visit: Payer: Self-pay

## 2019-03-04 ENCOUNTER — Encounter (HOSPITAL_COMMUNITY): Payer: Self-pay | Admitting: Psychiatry

## 2019-03-04 DIAGNOSIS — I2510 Atherosclerotic heart disease of native coronary artery without angina pectoris: Secondary | ICD-10-CM

## 2019-03-04 DIAGNOSIS — F319 Bipolar disorder, unspecified: Secondary | ICD-10-CM | POA: Diagnosis not present

## 2019-03-04 DIAGNOSIS — F411 Generalized anxiety disorder: Secondary | ICD-10-CM

## 2019-03-04 MED ORDER — OLANZAPINE 20 MG PO TABS: ORAL_TABLET | ORAL | 1 refills | Status: DC

## 2019-03-04 MED ORDER — LAMOTRIGINE 200 MG PO TABS: 200.0000 mg | ORAL_TABLET | Freq: Every day | ORAL | 1 refills | Status: DC

## 2019-03-04 MED ORDER — ALPRAZOLAM 0.5 MG PO TABS: 0.5000 mg | ORAL_TABLET | Freq: Three times a day (TID) | ORAL | 5 refills | Status: DC

## 2019-03-04 MED ORDER — TRAZODONE HCL 50 MG PO TABS: 50.0000 mg | ORAL_TABLET | Freq: Every day | ORAL | 1 refills | Status: DC

## 2019-03-04 NOTE — Progress Notes (Signed)
Virtual Visit via Telephone Note  I connected with Jeffrey Herring on 03/04/19 at  2:00 PM EDT by telephone and verified that I am speaking with the correct person using two identifiers.   I discussed the limitations, risks, security and privacy concerns of performing an evaluation and management service by telephone and the availability of in person appointments. I also discussed with the patient that there may be a patient responsible charge related to this service. The patient expressed understanding and agreed to proceed.   History of Present Illness: Patient was evaluated by phone today.  He is taking his medication as prescribed.  He is trying not to go outside due to COVID-19.  He feels the current medicine is working as he does not recall any agitation, anger, mania, psychosis.  He is sleeping good.  His energy level is fair.  His appetite is okay.  He lives with his wife who is very supportive.  He was complaining of dizziness but since we change Zyprexa to take at bedtime and discontinue trazodone he is doing much better.  He like to continue current medication.  He denies drinking or using any illegal substances.   Past Psychiatric History: Reviewed. History of bipolar disorder and suicide. Hospitalization because of mania and psychosis.  Good response with lithium until had a lithium toxicity.  Took Abilify, Risperdal, Prozac, Lexapro, Wellbutrin and Navane.   Observations/Objective: Mental status examination done on the phone.  Patient described his mood okay.  His speech is slow with decreased volume.  His attention and concentration is fair.  He denies any auditory or visual hallucination.  He denies any active or passive suicidal thoughts or homicidal thoughts.  There were no delusions, paranoia or any mania.  He is alert and oriented x3.  His cognition is fair.  His fund of knowledge is average.  He has no rash or itching.  His insight judgment is okay.  Assessment and  Plan: Bipolar disorder type I.  Generalized anxiety disorder.  Patient is a stable on his current medication.  Since we changed Zyprexa to take at bedtime and discontinue trazodone he is doing much better and do not recall any episodes of dizziness.  I will continue Lamictal 200 mg daily, Xanax 0.5 mg up to 3 times a day, Zyprexa 20 mg at bedtime.  Recommended to call us back if he has any question or any concern.  Patient has chronic renal insufficiency.  He has blood work schedule in few weeks.  Recommended to call us back if is any question or any concern.  Follow-up in 3 months.  Follow Up Instructions:    I discussed the assessment and treatment plan with the patient. The patient was provided an opportunity to ask questions and all were answered. The patient agreed with the plan and demonstrated an understanding of the instructions.   The patient was advised to call back or seek an in-person evaluation if the symptoms worsen or if the condition fails to improve as anticipated.  I provided 15 minutes of non-face-to-face time during this encounter.   [name], MD

## 2019-03-12 DIAGNOSIS — I442 Atrioventricular block, complete: Secondary | ICD-10-CM | POA: Diagnosis not present

## 2019-03-12 DIAGNOSIS — I10 Essential (primary) hypertension: Secondary | ICD-10-CM | POA: Diagnosis not present

## 2019-03-12 DIAGNOSIS — I2510 Atherosclerotic heart disease of native coronary artery without angina pectoris: Secondary | ICD-10-CM | POA: Diagnosis not present

## 2019-03-12 DIAGNOSIS — I420 Dilated cardiomyopathy: Secondary | ICD-10-CM | POA: Diagnosis not present

## 2019-03-19 DIAGNOSIS — Z87442 Personal history of urinary calculi: Secondary | ICD-10-CM | POA: Diagnosis not present

## 2019-03-19 DIAGNOSIS — N184 Chronic kidney disease, stage 4 (severe): Secondary | ICD-10-CM | POA: Diagnosis not present

## 2019-03-19 DIAGNOSIS — N400 Enlarged prostate without lower urinary tract symptoms: Secondary | ICD-10-CM | POA: Diagnosis not present

## 2019-04-17 ENCOUNTER — Other Ambulatory Visit: Payer: Self-pay | Admitting: Family Medicine

## 2019-04-17 DIAGNOSIS — I2510 Atherosclerotic heart disease of native coronary artery without angina pectoris: Secondary | ICD-10-CM

## 2019-04-25 ENCOUNTER — Ambulatory Visit: Payer: Medicare Other

## 2019-04-25 ENCOUNTER — Ambulatory Visit: Payer: Medicare Other | Admitting: Family Medicine

## 2019-04-29 ENCOUNTER — Other Ambulatory Visit: Payer: Self-pay | Admitting: Family Medicine

## 2019-04-29 DIAGNOSIS — I2510 Atherosclerotic heart disease of native coronary artery without angina pectoris: Secondary | ICD-10-CM

## 2019-04-29 DIAGNOSIS — I495 Sick sinus syndrome: Secondary | ICD-10-CM

## 2019-04-29 DIAGNOSIS — N138 Other obstructive and reflux uropathy: Secondary | ICD-10-CM

## 2019-05-01 ENCOUNTER — Ambulatory Visit (INDEPENDENT_AMBULATORY_CARE_PROVIDER_SITE_OTHER): Payer: Medicare Other | Admitting: Family Medicine

## 2019-05-01 ENCOUNTER — Encounter: Payer: Self-pay | Admitting: Family Medicine

## 2019-05-01 ENCOUNTER — Other Ambulatory Visit: Payer: Self-pay

## 2019-05-01 DIAGNOSIS — I5022 Chronic systolic (congestive) heart failure: Secondary | ICD-10-CM | POA: Diagnosis not present

## 2019-05-01 DIAGNOSIS — N401 Enlarged prostate with lower urinary tract symptoms: Secondary | ICD-10-CM

## 2019-05-01 DIAGNOSIS — I10 Essential (primary) hypertension: Secondary | ICD-10-CM

## 2019-05-01 DIAGNOSIS — N183 Chronic kidney disease, stage 3 unspecified: Secondary | ICD-10-CM

## 2019-05-01 DIAGNOSIS — R739 Hyperglycemia, unspecified: Secondary | ICD-10-CM | POA: Diagnosis not present

## 2019-05-01 DIAGNOSIS — I2510 Atherosclerotic heart disease of native coronary artery without angina pectoris: Secondary | ICD-10-CM | POA: Diagnosis not present

## 2019-05-01 DIAGNOSIS — F319 Bipolar disorder, unspecified: Secondary | ICD-10-CM

## 2019-05-01 DIAGNOSIS — N138 Other obstructive and reflux uropathy: Secondary | ICD-10-CM

## 2019-05-01 DIAGNOSIS — I495 Sick sinus syndrome: Secondary | ICD-10-CM | POA: Diagnosis not present

## 2019-05-01 MED ORDER — ROSUVASTATIN CALCIUM 20 MG PO TABS: 20.0000 mg | ORAL_TABLET | Freq: Every day | ORAL | 1 refills | Status: DC

## 2019-05-01 MED ORDER — METOPROLOL SUCCINATE ER 25 MG PO TB24: 25.0000 mg | ORAL_TABLET | Freq: Every day | ORAL | 1 refills | Status: DC

## 2019-05-01 MED ORDER — CLOPIDOGREL BISULFATE 75 MG PO TABS: 75.0000 mg | ORAL_TABLET | Freq: Every morning | ORAL | 1 refills | Status: DC

## 2019-05-01 NOTE — Progress Notes (Signed)
Name: Jeffrey Herring   MRN: [id number]    DOB: 05-14-1951   Date:05/01/2019       Progress Note  Subjective  Chief Complaint  Chief Complaint  Patient presents with  . Hyperlipidemia  . Chronic Kidney Disease    I connected with  [name]  on 05/01/19 at 10:00 AM EDT by a video enabled telemedicine application and verified that I am speaking with the correct person using two identifiers.  I discussed the limitations of evaluation and management by telemedicine and the availability of in person appointments. The patient expressed understanding and agreed to proceed. Staff also discussed with the patient that there may be a patient responsible charge related to this service. Patient Location: at home  Provider Location: [facility] Additional Individuals present: wife  HPI  CHF: he states he has some orthopnea occasionally, he always uses two pillows, he denies  chest pain or palpitation, he has SOB when he walks to get the mail, but that is the only time he walks. He has a pacemaker. First one [phone], had some vegetation on pacemaker leads back in 2009 and is seeing Dr. Georg Ruddle at [facility], and pacemaker was replaced again 04/2019He had multiple replacements in between and currently located on abdominal wall because of scar tissue. He hasone vessel CAD and is on Plavix and Metoprololhe is allergic to aspirin. He recently had a virtual visit with cardiologist and was advised to get a bp monitor but still has not purchased one   CKI: Currently seeing Dr. Sydell Axon, last visit was also virtual, no labs in a long time, advised wife to bring him over next week to have labs and we can fax to sub-specialist. He has noticed pruritis on his back. He is not interested in HD  Bipolar disorder I:still on Lamictal,Zyprexa, alprazolam and Trazodone . No recent manic episodes, He has been  disabled since age 53 . Last hgbA1C was normal , wife states he  has been doing better since COVID-19 , prefers being home  BPH: he has urinary frequency, taking Flomax to control symptoms. He has nocturia only once per night, stable. Unchanged  HTN: taking medication, no chest pain or palpitation.He has occasional SOB - likely from inactivity.Unable to check bp at home  Hyperlipidemia: taking simvastatin and LDL was not at goal, but is now on Crestor  No myalgia    Patient Active Problem List   Diagnosis Date Noted  . Nasal congestion with rhinorrhea 11/06/2018  . Chronic kidney disease, stage 4 (severe) ([place]) 10/24/2017  . Dilated cardiomyopathy ([place]) 02/18/2017  . BPH (benign prostatic hyperplasia) 11/04/2015  . Chronic kidney disease (CKD), stage III (moderate) (HCC) 10/28/2015  . Chronic systolic heart failure ([place]) 10/28/2015  . HLD (hyperlipidemia) 10/28/2015  . H/O deep venous thrombosis 10/28/2015  . History of sepsis 04/09/2014  . Inguinal hernia 01/06/2014  . Complete atrioventricular block ([place]) 10/14/2013  . Sinoatrial node dysfunction ([place]) 10/14/2013  . Kidney cysts 05/06/2013  . Hypertension, benign 10/01/2012  . Healed or old pulmonary embolism 09/30/2012  . Artificial cardiac pacemaker 09/30/2012  . Bipolar 1 disorder ([place]) 05/01/2012  . Arteriosclerosis of coronary artery 06/16/2009    Past Surgical History:  Procedure Laterality Date  . CARDIAC SURGERY    . HERNIA REPAIR Right 02/05/14  . NOSE SURGERY    . PACEMAKER PLACEMENT    . SPINE SURGERY      Family History  Problem Relation Age of Onset  .  Depression Mother     Social History   Socioeconomic History  . Marital status: Married    Spouse name: Not on file  . Number of children: 2  . Years of education: some college  . Highest education level: 12th grade  Occupational History    Employer: DISABLED  Social Needs  . Financial resource strain: Not hard at all  . Food insecurity    Worry: Never true    Inability: Never true  . Transportation needs     Medical: Yes    Non-medical: Yes  Tobacco Use  . Smoking status: Former Smoker    Years: 40.00    Types: Cigars    Quit date: 02/05/2018    Years since quitting: 1.2  . Smokeless tobacco: Never Used  . Tobacco comment: smoking cessation materials not required  Substance and Sexual Activity  . Alcohol use: No    Alcohol/week: 0.0 standard drinks  . Drug use: No  . Sexual activity: Never  Lifestyle  . Physical activity    Days per week: 0 days    Minutes per session: 0 min  . Stress: Not at all  Relationships  . Social Herbalist on phone: Never    Gets together: Once a week    Attends religious service: Never    Active member of club or organization: No    Attends meetings of clubs or organizations: Never    Relationship status: Married  . Intimate partner violence    Fear of current or ex partner: No    Emotionally abused: No    Physically abused: No    Forced sexual activity: No  Other Topics Concern  . 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  ALPRAZolam (XANAX) 0.5 MG tablet, Take 1 tablet (0.5 mg total) by mouth 3 (three) times daily., Disp: 90 tablet, Rfl: 5 .  cholecalciferol (VITAMIN D) 1000 units tablet, Take 1,000 Units by mouth daily., Disp: , Rfl:  .  clopidogrel (PLAVIX) 75 MG tablet, Take 1 tablet (75 mg total) by mouth every morning., Disp: 90 tablet, Rfl: 1 .  lamoTRIgine (LAMICTAL) 200 MG tablet, Take 1 tablet (200 mg total) by mouth daily., Disp: 90 tablet, Rfl: 1 .  metoprolol succinate (TOPROL-XL) 25 MG 24 hr tablet, Take 1 tablet (25 mg total) by mouth daily., Disp: 90 tablet, Rfl: 0 .  OLANZapine (ZYPREXA) 20 MG tablet, TAKE 1 TABLET BY MOUTH ATBEDTIME., Disp: 90 tablet, Rfl: 1 .  rosuvastatin (CRESTOR) 20 MG tablet, TAKE 1 TABLET (20 MG  TOTAL) BY MOUTH DAILY., Disp: 90 tablet, Rfl: 0 .  tamsulosin (FLOMAX) 0.4 MG CAPS capsule, Take 1 capsule (0.4 mg total) by mouth every morning., Disp: 90 capsule, Rfl: 1 .  vitamin C (ASCORBIC ACID) 500 MG tablet, Take 500 mg by mouth daily. , Disp: , Rfl:  .  fluocinolone (SYNALAR) 0.01 % external solution, Apply topically 2 (two) times daily. (Patient not taking: Reported on 05/01/2019), Disp: 60 mL, Rfl: 0 .  triamcinolone cream (KENALOG) 0.1 %, Apply 1 application topically 2 (two) times daily. (Patient not taking: Reported on 05/01/2019), Disp: 30 g, Rfl: 0  Allergies  Allergen Reactions  . Aspirin Hives, Shortness Of Breath  and Palpitations  . Codeine Nausea And Vomiting  . Demerol [Meperidine] Nausea And Vomiting  . Lithium      acute renal failure    I personally reviewed active problem list, medication list, allergies, family history, social history with the patient/caregiver today.   ROS  Ten systems reviewed and is negative except as mentioned in HPI   Objective  Virtual encounter, vitals not obtained.  There is no height or weight on file to calculate BMI.  Physical Exam  Awake, alert and oriented   PHQ2/9: Depression screen PHQ 2/9 05/01/2019  Decreased Interest 0  Down, Depressed, Hopeless 0  PHQ - 2 Score 0  Altered sleeping 0  Tired, decreased energy 0  Change in appetite 0  Feeling bad or failure about yourself  0  Trouble concentrating 0  Moving slowly or fidgety/restless 0  Suicidal thoughts 0  PHQ-9 Score 0  Some encounter information is confidential and restricted. Go to Review Flowsheets activity to see all data.   PHQ-2/9 Result is negative.    Fall Risk: Fall Risk  05/01/2019 04/23/2018  Falls in the past year? 0 -  Number falls in past yr: 0 -  Injury with Fall? 0 -  Risk for fall due to: Comment - wears eyeglasses; shuffling gait; Bipolar  Some encounter information is confidential and restricted. Go to Review Flowsheets activity to see  all data.     Assessment & Plan  1. ASCVD (arteriosclerotic cardiovascular disease)  - Lipid panel - rosuvastatin (CRESTOR) 20 MG tablet; Take 1 tablet (20 mg total) by mouth daily.  Dispense: 90 tablet; Refill: 1 - clopidogrel (PLAVIX) 75 MG tablet; Take 1 tablet (75 mg total) by mouth every morning.  Dispense: 90 tablet; Refill: 1  2. Sinoatrial node dysfunction (HCC)  - clopidogrel (PLAVIX) 75 MG tablet; Take 1 tablet (75 mg total) by mouth every morning.  Dispense: 90 tablet; Refill: 1  3. Essential hypertension  - CBC with Differential/Platelet - COMPLETE METABOLIC PANEL WITH GFR - metoprolol succinate (TOPROL-XL) 25 MG 24 hr tablet; Take 1 tablet (25 mg total) by mouth daily.  Dispense: 90 tablet; Refill: 1  4. Benign prostatic hyperplasia with urinary obstruction   5. Chronic kidney disease, stage 3 (moderate) (HCC)  - Microalbumin / creatinine urine ratio - VITAMIN D 25 Hydroxy (Vit-D Deficiency, Fractures) - PTH, intact and calcium - CBC with Differential/Platelet - COMPLETE METABOLIC PANEL WITH GFR  6. Bipolar 1 disorder (HCC)  Doing well on medication   7. Chronic systolic heart failure ([place])  Under the care of cardiologist and stable, SOB likely multifactorial, advised to walk more  8. Hyperglycemia  - Hemoglobin A1c  I discussed the assessment and treatment plan with the patient. The patient was provided an opportunity to ask questions and all were answered. The patient agreed with the plan and demonstrated an understanding of the instructions.  The patient was advised to call back or seek an in-person evaluation if the symptoms worsen or if the condition fails to improve as anticipated.  I provided 25 minutes of non-face-to-face time during this encounter.

## 2019-05-06 ENCOUNTER — Ambulatory Visit: Payer: Medicare Other

## 2019-05-16 ENCOUNTER — Ambulatory Visit (INDEPENDENT_AMBULATORY_CARE_PROVIDER_SITE_OTHER): Payer: Medicare Other

## 2019-05-16 DIAGNOSIS — Z0000 Encounter for general adult medical examination without abnormal findings: Secondary | ICD-10-CM

## 2019-05-16 NOTE — Patient Instructions (Signed)
[name] , Thank you for taking time to come for your Medicare Wellness Visit. I appreciate your ongoing commitment to your health goals. Please review the following plan we discussed and let me know if I can assist you in the future.   Screening recommendations/referrals: Colonoscopy: done 12/09/10. Repeat in 2022. Recommended yearly ophthalmology/optometry visit for glaucoma screening and checkup Recommended yearly dental visit for hygiene and checkup  Vaccinations: Influenza vaccine: done 11/06/18 Pneumococcal vaccine: done 10/28/15 Tdap vaccine: due - please contact us if you get a cut or scrape Shingles vaccine: Shingrix discussed. Please contact your pharmacy for coverage information.   Advanced directives: Please bring a copy of your health care power of attorney and living will to the office at your convenience once you have completed those documents.   Conditions/risks identified: recommend increasing physical activity to at least 3 days per week  Next appointment: Please follow up in one year for your Medicare Annual Wellness visit.    Preventive Care 69 Years and Older, Male Preventive care refers to lifestyle choices and visits with your health care provider that can promote health and wellness. What does preventive care include?  A yearly physical exam. This is also called an annual well check.  Dental exams once or twice a year.  Routine eye exams. Ask your health care provider how often you should have your eyes checked.  Personal lifestyle choices, including:  Daily care of your teeth and gums.  Regular physical activity.  Eating a healthy diet.  Avoiding tobacco and drug use.  Limiting alcohol use.  Practicing safe sex.  Taking low doses of aspirin every day.  Taking vitamin and mineral supplements as recommended by your health care provider. What happens during an annual well check? The services and screenings done by your health care provider during  your annual well check will depend on your age, overall health, lifestyle risk factors, and family history of disease. Counseling  Your health care provider may ask you questions about your:  Alcohol use.  Tobacco use.  Drug use.  Emotional well-being.  Home and relationship well-being.  Sexual activity.  Eating habits.  History of falls.  Memory and ability to understand (cognition).  Work and work Statistician. Screening  You may have the following tests or measurements:  Height, weight, and BMI.  Blood pressure.  Lipid and cholesterol levels. These may be checked every 5 years, or more frequently if you are over 39 years old.  Skin check.  Lung cancer screening. You may have this screening every year starting at age 28 if you have a 30-pack-year history of smoking and currently smoke or have quit within the past 15 years.  Fecal occult blood test (FOBT) of the stool. You may have this test every year starting at age 61.  Flexible sigmoidoscopy or colonoscopy. You may have a sigmoidoscopy every 5 years or a colonoscopy every 10 years starting at age 32.  Prostate cancer screening. Recommendations will vary depending on your family history and other risks.  Hepatitis C blood test.  Hepatitis B blood test.  Sexually transmitted disease (STD) testing.  Diabetes screening. This is done by checking your blood sugar (glucose) after you have not eaten for a while (fasting). You may have this done every 1-3 years.  Abdominal aortic aneurysm (AAA) screening. You may need this if you are a current or former smoker.  Osteoporosis. You may be screened starting at age 48 if you are at high risk. Talk with your  health care provider about your test results, treatment options, and if necessary, the need for more tests. Vaccines  Your health care provider may recommend certain vaccines, such as:  Influenza vaccine. This is recommended every year.  Tetanus, diphtheria, and  acellular pertussis (Tdap, Td) vaccine. You may need a Td booster every 10 years.  Zoster vaccine. You may need this after age 58.  Pneumococcal 13-valent conjugate (PCV13) vaccine. One dose is recommended after age 53.  Pneumococcal polysaccharide (PPSV23) vaccine. One dose is recommended after age 65. Talk to your health care provider about which screenings and vaccines you need and how often you need them. This information is not intended to replace advice given to you by your health care provider. Make sure you discuss any questions you have with your health care provider. Document Released: 10/29/2015 Document Revised: 06/21/2016 Document Reviewed: 08/03/2015 Elsevier Interactive Patient Education  2017 [place] Prevention in the Home Falls can cause injuries. They can happen to people of all ages. There are many things you can do to make your home safe and to help prevent falls. What can I do on the outside of my home?  Regularly fix the edges of walkways and driveways and fix any cracks.  Remove anything that might make you trip as you walk through a door, such as a raised step or threshold.  Trim any bushes or trees on the path to your home.  Use bright outdoor lighting.  Clear any walking paths of anything that might make someone trip, such as rocks or tools.  Regularly check to see if handrails are loose or broken. Make sure that both sides of any steps have handrails.  Any raised decks and porches should have guardrails on the edges.  Have any leaves, snow, or ice cleared regularly.  Use sand or salt on walking paths during winter.  Clean up any spills in your garage right away. This includes oil or grease spills. What can I do in the bathroom?  Use night lights.  Install grab bars by the toilet and in the tub and shower. Do not use towel bars as grab bars.  Use non-skid mats or decals in the tub or shower.  If you need to sit down in the shower, use  a plastic, non-slip stool.  Keep the floor dry. Clean up any water that spills on the floor as soon as it happens.  Remove soap buildup in the tub or shower regularly.  Attach bath mats securely with double-sided non-slip rug tape.  Do not have throw rugs and other things on the floor that can make you trip. What can I do in the bedroom?  Use night lights.  Make sure that you have a light by your bed that is easy to reach.  Do not use any sheets or blankets that are too big for your bed. They should not hang down onto the floor.  Have a firm chair that has side arms. You can use this for support while you get dressed.  Do not have throw rugs and other things on the floor that can make you trip. What can I do in the kitchen?  Clean up any spills right away.  Avoid walking on wet floors.  Keep items that you use a lot in easy-to-reach places.  If you need to reach something above you, use a strong step stool that has a grab bar.  Keep electrical cords out of the way.  Do not use  floor polish or wax that makes floors slippery. If you must use wax, use non-skid floor wax.  Do not have throw rugs and other things on the floor that can make you trip. What can I do with my stairs?  Do not leave any items on the stairs.  Make sure that there are handrails on both sides of the stairs and use them. Fix handrails that are broken or loose. Make sure that handrails are as long as the stairways.  Check any carpeting to make sure that it is firmly attached to the stairs. Fix any carpet that is loose or worn.  Avoid having throw rugs at the top or bottom of the stairs. If you do have throw rugs, attach them to the floor with carpet tape.  Make sure that you have a light switch at the top of the stairs and the bottom of the stairs. If you do not have them, ask someone to add them for you. What else can I do to help prevent falls?  Wear shoes that:  Do not have high heels.  Have  rubber bottoms.  Are comfortable and fit you well.  Are closed at the toe. Do not wear sandals.  If you use a stepladder:  Make sure that it is fully opened. Do not climb a closed stepladder.  Make sure that both sides of the stepladder are locked into place.  Ask someone to hold it for you, if possible.  Clearly mark and make sure that you can see:  Any grab bars or handrails.  First and last steps.  Where the edge of each step is.  Use tools that help you move around (mobility aids) if they are needed. These include:  Canes.  Walkers.  Scooters.  Crutches.  Turn on the lights when you go into a dark area. Replace any light bulbs as soon as they burn out.  Set up your furniture so you have a clear path. Avoid moving your furniture around.  If any of your floors are uneven, fix them.  If there are any pets around you, be aware of where they are.  Review your medicines with your doctor. Some medicines can make you feel dizzy. This can increase your chance of falling. Ask your doctor what other things that you can do to help prevent falls. This information is not intended to replace advice given to you by your health care provider. Make sure you discuss any questions you have with your health care provider. Document Released: 07/29/2009 Document Revised: 03/09/2016 Document Reviewed: 11/06/2014 Elsevier Interactive Patient Education  2017 [employer].

## 2019-05-16 NOTE — Progress Notes (Addendum)
Subjective:   Jeffrey Herring is a 68 y.o. male who presents for Medicare Annual/Subsequent preventive examination.  Virtual Visit via Telephone Note  I connected with [name] on 05/16/19 at 11:40 AM EDT by telephone and verified that I am speaking with the correct person using two identifiers.  Medicare Annual Wellness visit completed telephonically due to Covid-19 pandemic.   Location: Patient: home  Provider: office   I discussed the limitations, risks, security and privacy concerns of performing an evaluation and management service by telephone and the availability of in person appointments. The patient expressed understanding and agreed to proceed.  Some vitals signs may be absent or patient reported. Pt does not have at home blood pressure monitor, scale or thermometer.    Clemetine Marker, LPN  Review of Systems:   Cardiac Risk Factors include: advanced age (>74men, >74 women);dyslipidemia;male gender     Objective:    Vitals: There were no vitals taken for this visit.  There is no height or weight on file to calculate BMI.  Advanced Directives 05/16/2019 05/15/2014 04/28/2014 04/09/2014  Does Patient Have a Medical Advance Directive? No Patient does not have advance directive Patient does not have advance directive;Patient would not like information Patient does not have advance directive;Patient would not like information  Would patient like information on creating a medical advance directive? No - Patient declined - - -  Pre-existing out of facility DNR order (yellow form or pink MOST form) - - No No  Some encounter information is confidential and restricted. Go to Review Flowsheets activity to see all data.    Tobacco Social History   Tobacco Use  Smoking Status Former Smoker  . Years: 40.00  . Types: Cigars  . Quit date: 02/05/2018  . Years since quitting: 1.2  Smokeless Tobacco Never Used  Tobacco Comment   smoking cessation materials not required      Counseling given: Not Answered Comment: smoking cessation materials not required   Clinical Intake:  Pre-visit preparation completed: Yes  Pain : No/denies pain     Nutritional Risks: None Diabetes: No  How often do you need to have someone help you when you read instructions, pamphlets, or other written materials from your doctor or pharmacy?: 1 - Never  Interpreter Needed?: No  Information entered by :: Clemetine Marker LPN  Past Medical History:  Diagnosis Date  . Arrhythmia   . Bipolar 1 disorder ([place])   . Depression   . Diabetes insipidus ([place])   . History of DVT (deep vein thrombosis)   . History of kidney stones   . History of pulmonary embolism   . Hyperlipemia   . Lithium toxicity   . Pacemaker   . Stage III chronic kidney disease [facility])    Past Surgical History:  Procedure Laterality Date  . CARDIAC SURGERY    . HERNIA REPAIR Right 02/05/14  . NOSE SURGERY    . PACEMAKER PLACEMENT    . SPINE SURGERY     Family History  Problem Relation Age of Onset  . Depression Mother    Social History   Socioeconomic History  . Marital status: Married    Spouse name: Not on file  . Number of children: 2  . Years of education: some college  . Highest education level: 12th grade  Occupational History    Employer: DISABLED  Social Needs  . Financial resource strain: Not hard at all  . Food insecurity    Worry: Never true  Inability: Never true  . Transportation needs    Medical: Yes    Non-medical: Yes  Tobacco Use  . Smoking status: Former Smoker    Years: 40.00    Types: Cigars    Quit date: 02/05/2018    Years since quitting: 1.2  . Smokeless tobacco: Never Used  . Tobacco comment: smoking cessation materials not required  Substance and Sexual Activity  . Alcohol use: No    Alcohol/week: 0.0 standard drinks  . Drug use: No  . Sexual activity: Never  Lifestyle  . Physical activity    Days per week: 0 days    Minutes per session: 0 min  . Stress:  Not at all  Relationships  . Social Herbalist on phone: Never    Gets together: Once a week    Attends religious service: Never    Active member of club or organization: No    Attends meetings of clubs or organizations: Never    Relationship status: Married  Other Topics Concern  . 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 ALPRAZolam (XANAX) 0.5 MG tablet Take 1 tablet (0.5 mg total) by mouth 3 (three) times daily.  . cholecalciferol (VITAMIN D) 1000 units tablet Take 1,000 Units by mouth daily.  . clopidogrel (PLAVIX) 75 MG tablet Take 1 tablet (75 mg total) by mouth every morning.  . hydrALAZINE (APRESOLINE) 10 MG tablet Take 1 tablet by mouth 2 (two) times a day.  . lamoTRIgine (LAMICTAL) 200 MG tablet Take 1 tablet (200 mg total) by mouth daily.  . metoprolol succinate (TOPROL-XL) 25 MG 24 hr tablet Take 1 tablet (25 mg total) by mouth daily.  Marland Kitchen OLANZapine (ZYPREXA) 20 MG tablet TAKE 1 TABLET BY MOUTH ATBEDTIME.  . rosuvastatin (CRESTOR) 20 MG tablet Take 1 tablet (20 mg total) by mouth daily.  . tamsulosin (FLOMAX) 0.4 MG CAPS capsule Take 1 capsule (0.4 mg total) by mouth every morning.  . [DISCONTINUED] vitamin C (ASCORBIC ACID) 500 MG tablet Take 500 mg by mouth daily.    No facility-administered encounter medications on file as of 05/16/2019.     Activities of Daily Living In your present state of health, do you have any difficulty performing the following activities: 05/16/2019 05/01/2019  Hearing? N N  Comment declines hearing aids -  Vision? N N  Difficulty concentrating or making decisions? N N  Walking or climbing stairs? N N  Dressing or bathing? N N  Doing errands, shopping? N N  Preparing Food and eating ? N -  Using the  Toilet? N -  In the past six months, have you accidently leaked urine? N -  Do you have problems with loss of bowel control? N -  Managing your Medications? N -  Managing your Finances? N -  Housekeeping or managing your Housekeeping? N -  Some recent data might be hidden    Patient Care Team: Steele Sizer, MD as PCP - General (Family Medicine) Georg Ruddle, Ashok Cordia, MD as Referring Physician (Cardiology) [name], MD as Consulting Physician (Psychiatry) Tawni Levy, NP as Nurse Practitioner (Family Medicine) Janyce Llanos, NP as Nurse Practitioner (Cardiology)   Assessment:   This is  a routine wellness examination for [place].  Exercise Activities and Dietary recommendations Current Exercise Habits: The patient does not participate in regular exercise at present, Exercise limited by: None identified  Goals    . DIET - INCREASE WATER INTAKE     Recommend to drink at least 6-8 8oz glasses of water per day.    . Exercise 3x per week (30 min per time)     Recommend walking three times a week for 20-30 minutes.        Fall Risk Fall Risk  05/16/2019 05/01/2019 04/23/2018  Falls in the past year? 0 0 -  Number falls in past yr: 0 0 -  Injury with Fall? 0 0 -  Risk for fall due to: Comment - - wears eyeglasses; shuffling gait; Bipolar  Follow up Falls prevention discussed - -  Some encounter information is confidential and restricted. Go to Review Flowsheets activity to see all data.   FALL RISK PREVENTION PERTAINING TO THE HOME:  Any stairs in or around the home? Yes  If so, do they handrails? Yes   Home free of loose throw rugs in walkways, pet beds, electrical cords, etc? Yes  Adequate lighting in your home to reduce risk of falls? Yes   ASSISTIVE DEVICES UTILIZED TO PREVENT FALLS:  Life alert? No  Use of a cane, walker or w/c? No  Grab bars in the bathroom? Yes  Shower chair or bench in shower? No  Elevated toilet seat or a handicapped toilet? No   DME  ORDERS:  DME order needed?  No   TIMED UP AND GO:  Was the test performed? No . Telephonic visit.   Education: Fall risk prevention has been discussed.  Intervention(s) required? No   Depression Screen PHQ 2/9 Scores 05/16/2019 05/01/2019  PHQ - 2 Score 0 0  PHQ- 9 Score - 0  Some encounter information is confidential and restricted. Go to Review Flowsheets activity to see all data.    Cognitive Function     6CIT Screen 05/16/2019  What Year? 0 points  What month? 0 points  What time? 0 points  Count back from 20 0 points  Months in reverse 0 points  Repeat phrase 6 points  Total Score 6  Some encounter information is confidential and restricted. Go to Review Flowsheets activity to see all data.    Immunization History  Administered Date(s) Administered  . Influenza, High Dose Seasonal PF 10/28/2015, 09/20/2016, 08/02/2017, 11/06/2018  . Influenza, Seasonal, Injecte, Preservative Fre 07/16/2013, 05/16/2014, 08/02/2017  . Influenza-Unspecified 07/16/2013, 05/16/2014, 10/28/2015, 09/20/2016, 08/02/2017, 11/06/2018  . Pneumococcal Conjugate-13 10/28/2015  . Pneumococcal Polysaccharide-23 04/23/2014  . Pneumococcal-Unspecified 04/23/2014  . Tdap 10/27/2005    Qualifies for Shingles Vaccine? Yes . Due for Shingrix. Education has been provided regarding the importance of this vaccine. Pt has been advised to call insurance company to determine out of pocket expense. Advised may also receive vaccine at local pharmacy or Health Dept. Verbalized acceptance and understanding.  Tdap: Although this vaccine is not a covered service during a Wellness Exam, does the patient still wish to receive this vaccine today?  No .  Education has been provided regarding the importance of this vaccine. Advised may receive this vaccine at local pharmacy or Health Dept. Aware to provide a copy of the vaccination record if obtained from local pharmacy or Health Dept. Verbalized acceptance and  understanding.  Flu Vaccine: Up to date  Pneumococcal Vaccine: Up to date    Screening Tests Health  Maintenance  Topic Date Due  . URINE MICROALBUMIN  03/15/1961  . PNA vac Low Risk Adult (2 of 2 - PPSV23) 04/24/2019  . Hepatitis C Screening  08/31/2019 (Originally 08/20/1951)  . TETANUS/TDAP  10/16/2026 (Originally 10/28/2015)  . INFLUENZA VACCINE  05/17/2019  . COLONOSCOPY  12/09/2020   Cancer Screenings:  Colorectal Screening: Completed 12/09/10. Repeat every 10 years.  Lung Cancer Screening: (Low Dose CT Chest recommended if Age 56-80 years, 30 pack-year currently smoking OR have quit w/in 15years.) does not qualify.    Additional Screening:  Hepatitis C Screening: does qualify; postponed  Vision Screening: Recommended annual ophthalmology exams for early detection of glaucoma and other disorders of the eye. Is the patient up to date with their annual eye exam?  No  Who is the provider or what is the name of the office in which the pt attends annual eye exams? Not established If pt is not established with a provider, would they like to be referred to a provider to establish care? No . Ophthalmology referral has been placed. Pt aware the office will call re: appt.  Dental Screening: Recommended annual dental exams for proper oral hygiene  Community Resource Referral:  CRR required this visit?  No       Plan:    I have personally reviewed and addressed the Medicare Annual Wellness questionnaire and have noted the following in the patient's chart:  A. Medical and social history B. Use of alcohol, tobacco or illicit drugs  C. Current medications and supplements D. Functional ability and status E.  Nutritional status F.  Physical activity G. Advance directives H. List of other physicians I.  Hospitalizations, surgeries, and ER visits in previous 12 months J.  [place] such as hearing and vision if needed, cognitive and depression L. Referrals and  appointments   In addition, I have reviewed and discussed with patient certain preventive protocols, quality metrics, and best practice recommendations. A written personalized care plan for preventive services as well as general preventive health recommendations were provided to patient.   Signed,  Clemetine Marker, LPN Nurse Health Advisor   Nurse Notes: pt accompanied during telephonic visit today by his wife. He is doing well and appreciative of visit today.

## 2019-05-23 ENCOUNTER — Telehealth: Payer: Self-pay | Admitting: Family Medicine

## 2019-05-23 DIAGNOSIS — N138 Other obstructive and reflux uropathy: Secondary | ICD-10-CM

## 2019-05-23 DIAGNOSIS — N401 Enlarged prostate with lower urinary tract symptoms: Secondary | ICD-10-CM

## 2019-05-23 NOTE — Telephone Encounter (Signed)
Refill request for general medication. Flomax to [facility]  Last office visit 05/01/2019   Follow up on 05/21/2019

## 2019-05-26 NOTE — Telephone Encounter (Signed)
lvm to schedule appt also lm informing that prescription has been sent to pharmacy

## 2019-06-05 DIAGNOSIS — Z23 Encounter for immunization: Secondary | ICD-10-CM | POA: Diagnosis not present

## 2019-06-17 DIAGNOSIS — I442 Atrioventricular block, complete: Secondary | ICD-10-CM | POA: Diagnosis not present

## 2019-08-06 ENCOUNTER — Ambulatory Visit (INDEPENDENT_AMBULATORY_CARE_PROVIDER_SITE_OTHER): Payer: Medicare Other

## 2019-08-06 ENCOUNTER — Other Ambulatory Visit: Payer: Self-pay

## 2019-08-06 DIAGNOSIS — N183 Chronic kidney disease, stage 3 unspecified: Secondary | ICD-10-CM | POA: Diagnosis not present

## 2019-08-06 DIAGNOSIS — Z23 Encounter for immunization: Secondary | ICD-10-CM | POA: Diagnosis not present

## 2019-08-06 DIAGNOSIS — R739 Hyperglycemia, unspecified: Secondary | ICD-10-CM | POA: Diagnosis not present

## 2019-08-06 DIAGNOSIS — I10 Essential (primary) hypertension: Secondary | ICD-10-CM | POA: Diagnosis not present

## 2019-08-06 DIAGNOSIS — I2510 Atherosclerotic heart disease of native coronary artery without angina pectoris: Secondary | ICD-10-CM | POA: Diagnosis not present

## 2019-08-07 LAB — CBC WITH DIFFERENTIAL/PLATELET
Absolute Monocytes: 847 cells/uL (ref 200–950)
Basophils Absolute: 31 cells/uL (ref 0–200)
Basophils Relative: 0.3 %
Eosinophils Absolute: 92 cells/uL (ref 15–500)
Eosinophils Relative: 0.9 %
HCT: 40.2 % (ref 38.5–50.0)
Hemoglobin: 13.7 g/dL (ref 13.2–17.1)
Lymphs Abs: 571 cells/uL — ABNORMAL LOW (ref 850–3900)
MCH: 30.8 pg (ref 27.0–33.0)
MCHC: 34.1 g/dL (ref 32.0–36.0)
MCV: 90.3 fL (ref 80.0–100.0)
MPV: 10.4 fL (ref 7.5–12.5)
Monocytes Relative: 8.3 %
Neutro Abs: 8660 cells/uL — ABNORMAL HIGH (ref 1500–7800)
Neutrophils Relative %: 84.9 %
Platelets: 177 10*3/uL (ref 140–400)
RBC: 4.45 10*6/uL (ref 4.20–5.80)
RDW: 12.7 % (ref 11.0–15.0)
Total Lymphocyte: 5.6 %
WBC: 10.2 10*3/uL (ref 3.8–10.8)

## 2019-08-07 LAB — MICROALBUMIN / CREATININE URINE RATIO
Creatinine, Urine: 187 mg/dL (ref 20–320)
Microalb Creat Ratio: 33 mcg/mg creat — ABNORMAL HIGH (ref <30)
Microalb, Ur: 6.1 mg/dL

## 2019-08-07 LAB — COMPLETE METABOLIC PANEL WITH GFR
AG Ratio: 1.7 (calc) (ref 1.0–2.5)
ALT: 7 U/L — ABNORMAL LOW (ref 9–46)
AST: 10 U/L (ref 10–35)
Albumin: 4.4 g/dL (ref 3.6–5.1)
Alkaline phosphatase (APISO): 81 U/L (ref 35–144)
BUN/Creatinine Ratio: 11 (calc) (ref 6–22)
BUN: 27 mg/dL — ABNORMAL HIGH (ref 7–25)
CO2: 21 mmol/L (ref 20–32)
Calcium: 10.3 mg/dL (ref 8.6–10.3)
Chloride: 103 mmol/L (ref 98–110)
Creat: 2.4 mg/dL — ABNORMAL HIGH (ref 0.70–1.25)
GFR, Est African American: 31 mL/min/{1.73_m2} — ABNORMAL LOW (ref >=60)
GFR, Est Non African American: 27 mL/min/{1.73_m2} — ABNORMAL LOW (ref >=60)
Globulin: 2.6 g/dL (calc) (ref 1.9–3.7)
Glucose, Bld: 118 mg/dL — ABNORMAL HIGH (ref 65–99)
Potassium: 3.8 mmol/L (ref 3.5–5.3)
Sodium: 136 mmol/L (ref 135–146)
Total Bilirubin: 0.6 mg/dL (ref 0.2–1.2)
Total Protein: 7 g/dL (ref 6.1–8.1)

## 2019-08-07 LAB — HEMOGLOBIN A1C
Hgb A1c MFr Bld: 5.4 % of total Hgb (ref <5.7)
Mean Plasma Glucose: 108 (calc)
eAG (mmol/L): 6 (calc)

## 2019-08-07 LAB — LIPID PANEL
Cholesterol: 128 mg/dL (ref <200)
HDL: 37 mg/dL — ABNORMAL LOW (ref >=40)
LDL Cholesterol (Calc): 66 mg/dL (calc)
Non-HDL Cholesterol (Calc): 91 mg/dL (calc) (ref <130)
Total CHOL/HDL Ratio: 3.5 (calc) (ref <5.0)
Triglycerides: 171 mg/dL — ABNORMAL HIGH (ref <150)

## 2019-08-07 LAB — PTH, INTACT AND CALCIUM
Calcium: 10.3 mg/dL (ref 8.6–10.3)
PTH: 48 pg/mL (ref 14–64)

## 2019-08-07 LAB — VITAMIN D 25 HYDROXY (VIT D DEFICIENCY, FRACTURES): Vit D, 25-Hydroxy: 44 ng/mL (ref 30–100)

## 2019-08-10 ENCOUNTER — Other Ambulatory Visit: Payer: Self-pay

## 2019-08-10 ENCOUNTER — Encounter: Payer: Self-pay | Admitting: Emergency Medicine

## 2019-08-10 ENCOUNTER — Emergency Department
Admission: EM | Admit: 2019-08-10 | Discharge: 2019-08-10 | Disposition: A | Discharge status: Home / Self Care | Payer: Medicare Other | Attending: Emergency Medicine | Admitting: Emergency Medicine

## 2019-08-10 DIAGNOSIS — I2510 Atherosclerotic heart disease of native coronary artery without angina pectoris: Secondary | ICD-10-CM | POA: Diagnosis not present

## 2019-08-10 DIAGNOSIS — I5022 Chronic systolic (congestive) heart failure: Secondary | ICD-10-CM | POA: Insufficient documentation

## 2019-08-10 DIAGNOSIS — L02412 Cutaneous abscess of left axilla: Secondary | ICD-10-CM | POA: Diagnosis not present

## 2019-08-10 DIAGNOSIS — N184 Chronic kidney disease, stage 4 (severe): Secondary | ICD-10-CM | POA: Insufficient documentation

## 2019-08-10 DIAGNOSIS — Z87891 Personal history of nicotine dependence: Secondary | ICD-10-CM | POA: Diagnosis not present

## 2019-08-10 DIAGNOSIS — Z950 Presence of cardiac pacemaker: Secondary | ICD-10-CM | POA: Insufficient documentation

## 2019-08-10 DIAGNOSIS — L720 Epidermal cyst: Secondary | ICD-10-CM | POA: Insufficient documentation

## 2019-08-10 DIAGNOSIS — I130 Hypertensive heart and chronic kidney disease with heart failure and stage 1 through stage 4 chronic kidney disease, or unspecified chronic kidney disease: Secondary | ICD-10-CM | POA: Diagnosis not present

## 2019-08-10 DIAGNOSIS — L0291 Cutaneous abscess, unspecified: Secondary | ICD-10-CM | POA: Diagnosis present

## 2019-08-10 DIAGNOSIS — E1122 Type 2 diabetes mellitus with diabetic chronic kidney disease: Secondary | ICD-10-CM | POA: Insufficient documentation

## 2019-08-10 MED ORDER — TRAMADOL HCL 50 MG PO TABS
50.0000 mg | ORAL_TABLET | Freq: Once | ORAL | Status: AC
  Administered 2019-08-10: 50 mg via ORAL
  Filled 2019-08-10: qty 1

## 2019-08-10 MED ORDER — LIDOCAINE HCL (PF) 1 % IJ SOLN
5.0000 mL | Freq: Once | INTRAMUSCULAR | Status: AC
  Administered 2019-08-10: 14:00:00 5 mL

## 2019-08-10 MED ORDER — LIDOCAINE HCL (PF) 1 % IJ SOLN
5.0000 mL | Freq: Once | INTRAMUSCULAR | Status: AC
  Administered 2019-08-10: 5 mL
  Filled 2019-08-10: qty 5

## 2019-08-10 MED ORDER — LIDOCAINE HCL (PF) 1 % IJ SOLN
INTRAMUSCULAR | Status: AC
  Filled 2019-08-10: qty 5

## 2019-08-10 MED ORDER — SULFAMETHOXAZOLE-TRIMETHOPRIM 800-160 MG PO TABS
1.0000 | ORAL_TABLET | Freq: Once | ORAL | Status: AC
  Administered 2019-08-10: 1 via ORAL
  Filled 2019-08-10: qty 1

## 2019-08-10 MED ORDER — SULFAMETHOXAZOLE-TRIMETHOPRIM 800-160 MG PO TABS: 1.0000 | ORAL_TABLET | Freq: Two times a day (BID) | ORAL | 0 refills | Status: DC

## 2019-08-10 MED ORDER — TRAMADOL HCL 50 MG PO TABS: 50.0000 mg | ORAL_TABLET | Freq: Three times a day (TID) | ORAL | 0 refills | Status: AC | PRN

## 2019-08-10 NOTE — ED Provider Notes (Signed)
[facility] Emergency Department Provider Note ____________________________________________  Time seen: 1242  I have reviewed the triage vital signs and the nursing notes.  HISTORY  Chief Complaint  Abscess  HPI Jeffrey Herring is a 68 y.o. male presents himself to the ED for evaluation of a draining abscess. He notes  it has been present for about 2 weeks. He notes spontaneous drainage of purulent drainage. He denies any fevers, chills, or sweats.   Past Medical History:  Diagnosis Date  . Arrhythmia   . Bipolar 1 disorder ([place])   . Depression   . Diabetes insipidus ([place])   . History of DVT (deep vein thrombosis)   . History of kidney stones   . History of pulmonary embolism   . Hyperlipemia   . Lithium toxicity   . Pacemaker   . Stage III chronic kidney disease     Patient Active Problem List   Diagnosis Date Noted  . Nasal congestion with rhinorrhea 11/06/2018  . Chronic kidney disease, stage 4 (severe) ([place]) 10/24/2017  . Dilated cardiomyopathy ([place]) 02/18/2017  . BPH (benign prostatic hyperplasia) 11/04/2015  . Chronic kidney disease (CKD), stage III (moderate) 10/28/2015  . Chronic systolic heart failure ([place]) 10/28/2015  . HLD (hyperlipidemia) 10/28/2015  . H/O deep venous thrombosis 10/28/2015  . History of sepsis 04/09/2014  . Inguinal hernia 01/06/2014  . Complete atrioventricular block ([place]) 10/14/2013  . Sinoatrial node dysfunction ([place]) 10/14/2013  . Kidney cysts 05/06/2013  . Hypertension, benign 10/01/2012  . Healed or old pulmonary embolism 09/30/2012  . Artificial cardiac pacemaker 09/30/2012  . Bipolar 1 disorder ([place]) 05/01/2012  . Arteriosclerosis of coronary artery 06/16/2009    Past Surgical History:  Procedure Laterality Date  . CARDIAC SURGERY    . HERNIA REPAIR Right 02/05/14  . NOSE SURGERY    . PACEMAKER PLACEMENT    . SPINE SURGERY      Prior to Admission medications   Medication Sig Start Date End Date  Taking? Authorizing Provider  ALPRAZolam [name]) 0.5 MG tablet Take 1 tablet (0.5 mg total) by mouth 3 (three) times daily. 03/04/19   Arfeen, Arlyce Harman, MD  cholecalciferol (VITAMIN D) 1000 units tablet Take 1,000 Units by mouth daily.    [provider]  clopidogrel (PLAVIX) 75 MG tablet Take 1 tablet (75 mg total) by mouth every morning. 05/01/19   Steele Sizer, MD  hydrALAZINE (APRESOLINE) 10 MG tablet Take 1 tablet by mouth 2 (two) times a day. 11/12/18   [provider]  lamoTRIgine (LAMICTAL) 200 MG tablet Take 1 tablet (200 mg total) by mouth daily. 03/04/19   Arfeen, Arlyce Harman, MD  metoprolol succinate (TOPROL-XL) 25 MG 24 hr tablet Take 1 tablet (25 mg total) by mouth daily. 05/01/19   Sowles, Drue Stager, MD  OLANZapine (ZYPREXA) 20 MG tablet TAKE 1 TABLET BY MOUTH ATBEDTIME. 03/04/19   Arfeen, Arlyce Harman, MD  rosuvastatin (CRESTOR) 20 MG tablet Take 1 tablet (20 mg total) by mouth daily. 05/01/19   Steele Sizer, MD  sulfamethoxazole-trimethoprim (BACTRIM DS) 800-160 MG tablet Take 1 tablet by mouth 2 (two) times daily. 08/10/19   Raekwan Spelman, Dannielle Karvonen, PA-C  tamsulosin (FLOMAX) 0.4 MG CAPS capsule TAKE 1 CAPSULE (0.4 MG TOTAL) BY MOUTH EVERY MORNING. 05/25/19   Ancil Boozer, Drue Stager, MD  traMADol (ULTRAM) 50 MG tablet Take 1 tablet (50 mg total) by mouth 3 (three) times daily as needed for up to 3 days. 08/10/19 08/13/19  Brigett Estell, Dannielle Karvonen, PA-C  Allergies Aspirin, Codeine, Demerol [meperidine], and Lithium  Family History  Problem Relation Age of Onset  . Depression Mother     Social History Social History   Tobacco Use  . Smoking status: Former Smoker    Years: 40.00    Types: Cigars    Quit date: 02/05/2018    Years since quitting: 1.5  . Smokeless tobacco: Never Used  . Tobacco comment: smoking cessation materials not required  Substance Use Topics  . Alcohol use: No    Alcohol/week: 0.0 standard drinks  . Drug use: No    Review of Systems  Constitutional:  Negative for fever. Cardiovascular: Negative for chest pain. Respiratory: Negative for shortness of breath. Musculoskeletal: Negative for back pain. Skin: Negative for rash. Left axilla abscess as above.  Neurological: Negative for headaches, focal weakness or numbness. ____________________________________________  PHYSICAL EXAM:  VITAL SIGNS: ED Triage Vitals  Enc Vitals Group     BP 08/10/19 1208 140/82     Pulse Rate 08/10/19 1208 100     Resp 08/10/19 1208 18     Temp 08/10/19 1208 98.9 F (37.2 C)     Temp src --      SpO2 08/10/19 1208 94 %     Weight 08/10/19 1210 175 lb (79.4 kg)     Height 08/10/19 1210 5\' 10"  (1.778 m)     Head Circumference --      Peak Flow --      Pain Score 08/10/19 1209 6     Pain Loc --      Pain Edu? --      Excl. in [place]? --     Constitutional: Alert and oriented. Well appearing and in no distress. Head: Normocephalic and atraumatic. Eyes: Conjunctivae are normal. Normal extraocular movements Cardiovascular: Normal rate, regular rhythm. Normal distal pulses. Respiratory: Normal respiratory effort.  Musculoskeletal: Nontender with normal range of motion in all extremities.  Neurologic:  Normal gait without ataxia. Normal speech and language. No gross focal neurologic deficits are appreciated. Skin:  Skin is warm, dry and intact. No rash noted.  Left axilla with local erythema.  Patient with a deep palpable cystic formation to the axilla.  There is a sinus tract appears to be expressing purulent drainage with manipulation.  No pointing, fluctuance, or induration is appreciated. ____________________________________________  PROCEDURES  Bactrim Ds 1 PO Ultram 50 mg PO  .Marland KitchenIncision and Drainage  Date/Time: 08/10/2019 1:17 PM Performed by: Melvenia Needles, PA-C Authorized by: Melvenia Needles, PA-C   Consent:    Consent obtained:  Verbal   Consent given by:  Patient   Risks discussed:  Bleeding, incomplete drainage and  pain   Alternatives discussed:  Alternative treatment Location:    Type:  Abscess   Location:  Upper extremity   Upper extremity location: axilla. Pre-procedure details:    Skin preparation:  Betadine Anesthesia (see MAR for exact dosages):    Anesthesia method:  Local infiltration   Local anesthetic:  Lidocaine 1% w/o epi Procedure type:    Complexity:  Simple Procedure details:    Incision types:  Single straight   Incision depth:  Subcutaneous   Scalpel blade:  11   Wound management:  Probed and deloculated and irrigated with saline   Drainage:  Bloody   Drainage amount:  Moderate   Packing materials:  1/4 in iodoform gauze   Amount 1/4" iodoform:  6 & 4  Post-procedure details:    Patient tolerance of procedure:  Tolerated well, no immediate complications  ____________________________________________  INITIAL IMPRESSION / ASSESSMENT AND PLAN / ED COURSE  Patient with ED evaluation of a 2-week complaint of intermittent drainage from the left axilla.  Patient's clinical picture is concerning for possible abscess.  He does confirm her chronic cyst in the same area.  Patient's I&D procedure elicits some minimal purulent drainage, primarily bloody drainage and what appears to be chronic cyst wall fragments consistent with a probable infected epidermal cyst.  The 2 skin incisions are packed with iodoform dressing, and the patient is discharged to the care of his wife with wound care instructions and supplies.  A prescription for Bactrim and Ultram are provided for his benefit.  He will follow-up with primary provider in 3 days for wound check and packing removal.  Return precautions have otherwise been reviewed.  Jeffrey Herring was evaluated in Emergency Department on 08/10/2019 for the symptoms described in the history of present illness. He was evaluated in the context of the global COVID-19 pandemic, which necessitated consideration that the patient might be at risk for infection  with the SARS-CoV-2 virus that causes COVID-19. Institutional protocols and algorithms that pertain to the evaluation of patients at risk for COVID-19 are in a state of rapid change based on information released by regulatory bodies including the CDC and federal and state organizations. These policies and algorithms were followed during the patient's care in the ED. ____________________________________________  FINAL CLINICAL IMPRESSION(S) / ED DIAGNOSES  Final diagnoses:  Abscess of left axilla  Epidermoid cyst of skin      Joenathan Sakuma, Dannielle Karvonen, PA-C 08/10/19 1717    Delman Kitten, MD 08/11/19 1414

## 2019-08-10 NOTE — ED Notes (Signed)
Abscess noted to R axilla with purulent drainage. Periwound area is reddened with excoriation/MASD.

## 2019-08-10 NOTE — ED Triage Notes (Signed)
Pt presents to ED c/o abscess to R axilla for 2 wks. Pt states area is draining yellow/green discharge. Pain improved with hot showers.

## 2019-08-10 NOTE — Discharge Instructions (Signed)
Keep the wound clean, dry, and covered. Use warm compresses over the dressing to promote healing. Take the antibiotic as directed. See. Dr. Ancil Boozer in 3 days for packing removal. Return to the ED as needed.

## 2019-08-11 ENCOUNTER — Telehealth: Payer: Self-pay

## 2019-08-11 NOTE — Telephone Encounter (Signed)
Patient went to the ER for the boil under his underarm. He had draining green fluid and a red streak on his arm was given Sulfamethoxazole-Trimethoprim and Tramadol. Wife states he feels better today and his arm is not hurting as bad. They packed his wound and drained it at the ER.

## 2019-08-11 NOTE — Telephone Encounter (Signed)
Copied from [place] [id number]. Topic: General - Inquiry >> Aug 08, 2019  9:59 AM Virl Axe D wrote: Reason for CRM: Pt's wife stated they had a missed call from practice and wondered if it was for pt's lab results. Requesting CB with labs. [phone]

## 2019-08-19 ENCOUNTER — Other Ambulatory Visit (HOSPITAL_COMMUNITY): Payer: Self-pay | Admitting: Psychiatry

## 2019-08-19 DIAGNOSIS — F319 Bipolar disorder, unspecified: Secondary | ICD-10-CM

## 2019-09-04 ENCOUNTER — Encounter (HOSPITAL_COMMUNITY): Payer: Self-pay | Admitting: Psychiatry

## 2019-09-04 ENCOUNTER — Other Ambulatory Visit: Payer: Self-pay

## 2019-09-04 ENCOUNTER — Ambulatory Visit (INDEPENDENT_AMBULATORY_CARE_PROVIDER_SITE_OTHER): Payer: Medicare Other | Admitting: Psychiatry

## 2019-09-04 DIAGNOSIS — F319 Bipolar disorder, unspecified: Secondary | ICD-10-CM

## 2019-09-04 DIAGNOSIS — I2510 Atherosclerotic heart disease of native coronary artery without angina pectoris: Secondary | ICD-10-CM

## 2019-09-04 DIAGNOSIS — F411 Generalized anxiety disorder: Secondary | ICD-10-CM | POA: Diagnosis not present

## 2019-09-04 MED ORDER — ALPRAZOLAM 0.5 MG PO TABS: 0.5000 mg | ORAL_TABLET | Freq: Three times a day (TID) | ORAL | 5 refills | Status: DC

## 2019-09-04 MED ORDER — OLANZAPINE 20 MG PO TABS: ORAL_TABLET | ORAL | 1 refills | Status: DC

## 2019-09-04 MED ORDER — LAMOTRIGINE 200 MG PO TABS: 200.0000 mg | ORAL_TABLET | Freq: Every day | ORAL | 1 refills | Status: DC

## 2019-09-04 NOTE — Progress Notes (Signed)
Virtual Visit via Telephone Note  I connected with Jeffrey Herring on 09/04/19 at  2:00 PM EST by telephone and verified that I am speaking with the correct person using two identifiers.   I discussed the limitations, risks, security and privacy concerns of performing an evaluation and management service by telephone and the availability of in person appointments. I also discussed with the patient that there may be a patient responsible charge related to this service. The patient expressed understanding and agreed to proceed.   History of Present Illness: Patient was evaluated by phone session.  His wife was close by.  He is taking his medication and recently he has received the shipment of trazodone 50 mg which he takes as needed for insomnia.  We have discontinued trazodone because he was sleeping good.  However he did not remember and takes most of the night.  He admitted sometimes he sleeps too much.  I recommend that he should stop the trazodone if he is sleeping too much.  Overall he feels his mood is stable he denies any irritability, anger, mania, psychosis or any hallucination.  Recently he has blood work and his creatinine remained stable 2.4.  His hemoglobin A1c is also normal.  He has chronic renal insufficiency.  Lately he is not going outside due to Covid and stays most of the time home.  He admitted some anxiety but he realized that he has no choice to stay home since Covid condition improved.  His energy level is fair.  He wants to continue current medication.  He has no rash, itching, tremors or shakes.     Past Psychiatric History:Reviewed. H/O bipolar disorder. H/O inpatient mania and psychosis. Good response with lithium until had a lithium toxicity. Took Abilify, Risperdal, Prozac, Lexapro, Wellbutrin and Navane.  Recent Results (from the past 2160 hour(s))  Lipid panel     Status: Abnormal   Collection Time: 08/06/19 12:00 AM  Result Value Ref Range   Cholesterol 128 <200  mg/dL   HDL 37 (L) > OR = 40 mg/dL   Triglycerides 171 (H) <150 mg/dL   LDL Cholesterol (Calc) 66 mg/dL (calc)    Comment: Reference range: <100 . Desirable range <100 mg/dL for primary prevention;   <70 mg/dL for patients with CHD or diabetic patients  with > or = 2 CHD risk factors. Marland Kitchen LDL-C is now calculated using the Martin-Hopkins  calculation, which is a validated novel method providing  better accuracy than the Friedewald equation in the  estimation of LDL-C.  Cresenciano Genre et al. Annamaria Helling. 3474;259(56): 2061-2068  (http://education.[facility].com/faq/FAQ164)    Total CHOL/HDL Ratio 3.5 <5.0 (calc)   Non-HDL Cholesterol (Calc) 91 <130 mg/dL (calc)    Comment: For patients with diabetes plus 1 major ASCVD risk  factor, treating to a non-HDL-C goal of <100 mg/dL  (LDL-C of <70 mg/dL) is considered a therapeutic  option.   Microalbumin / creatinine urine ratio     Status: Abnormal   Collection Time: 08/06/19 12:00 AM  Result Value Ref Range   Creatinine, Urine 187 20 - 320 mg/dL   Microalb, Ur 6.1 mg/dL    Comment: Reference Range Not established    Microalb Creat Ratio 33 (H) <30 mcg/mg creat    Comment: . The ADA defines abnormalities in albumin excretion as follows: Marland Kitchen Category         Result (mcg/mg creatinine) . Normal                    <  30 Microalbuminuria         30-299  Clinical albuminuria   > OR = 300 . The ADA recommends that at least two of three specimens collected within a 3-6 month period be abnormal before considering a patient to be within a diagnostic category.   VITAMIN D 25 Hydroxy (Vit-D Deficiency, Fractures)     Status: None   Collection Time: 08/06/19 12:00 AM  Result Value Ref Range   Vit D, 25-Hydroxy 44 30 - 100 ng/mL    Comment: Vitamin D Status         25-OH Vitamin D: . Deficiency:                    <20 ng/mL Insufficiency:             20 - 29 ng/mL Optimal:                 > or = 30 ng/mL . For 25-OH Vitamin D testing on  patients on  D2-supplementation and patients for whom quantitation  of D2 and D3 fractions is required, the [facility](TM) 25-OH VIT D, (D2,D3), LC/MS/MS is recommended: order  code [id number] (patients >24yrs). See Note 1 . Note 1 . For additional information, please refer to  http://education.[facility].com/faq/FAQ199  (This link is being provided for informational/ educational purposes only.)   Hemoglobin A1c     Status: None   Collection Time: 08/06/19 12:00 AM  Result Value Ref Range   Hgb A1c MFr Bld 5.4 <5.7 % of total Hgb    Comment: For the purpose of screening for the presence of diabetes: . <5.7%       Consistent with the absence of diabetes 5.7-6.4%    Consistent with increased risk for diabetes             (prediabetes) > or =6.5%  Consistent with diabetes . This assay result is consistent with a decreased risk of diabetes. . Currently, no consensus exists regarding use of hemoglobin A1c for diagnosis of diabetes in children. . According to American Diabetes Association (ADA) guidelines, hemoglobin A1c <7.0% represents optimal control in non-pregnant diabetic patients. Different metrics may apply to specific patient populations.  Standards of Medical Care in Diabetes(ADA). .    Mean Plasma Glucose 108 (calc)   eAG (mmol/L) 6.0 (calc)  PTH, intact and calcium     Status: None   Collection Time: 08/06/19 12:00 AM  Result Value Ref Range   PTH 48 14 - 64 pg/mL    Comment: . Interpretive Guide    Intact PTH           Calcium ------------------    ----------           ------- Normal Parathyroid    Normal               Normal Hypoparathyroidism    Low or Low Normal    Low Hyperparathyroidism    Primary            Normal or High       High    Secondary          High                 Normal or Low    Tertiary           High                 High Non-Parathyroid    Hypercalcemia  Low or Low Normal    High .    Calcium 10.3 8.6 - 10.3 mg/dL  CBC with  Differential/Platelet     Status: Abnormal   Collection Time: 08/06/19 12:00 AM  Result Value Ref Range   WBC 10.2 3.8 - 10.8 Thousand/uL   RBC 4.45 4.20 - 5.80 Million/uL   Hemoglobin 13.7 13.2 - 17.1 g/dL   HCT 40.2 38.5 - 50.0 %   MCV 90.3 80.0 - 100.0 fL   MCH 30.8 27.0 - 33.0 pg   MCHC 34.1 32.0 - 36.0 g/dL   RDW 12.7 11.0 - 15.0 %   Platelets 177 140 - 400 Thousand/uL   MPV 10.4 7.5 - 12.5 fL   Neutro Abs 8,660 (H) 1,500 - 7,800 cells/uL   Lymphs Abs 571 (L) 850 - 3,900 cells/uL   Absolute Monocytes 847 200 - 950 cells/uL   Eosinophils Absolute 92 15 - 500 cells/uL   Basophils Absolute 31 0 - 200 cells/uL   Neutrophils Relative % 84.9 %   Total Lymphocyte 5.6 %   Monocytes Relative 8.3 %   Eosinophils Relative 0.9 %   Basophils Relative 0.3 %  COMPLETE METABOLIC PANEL WITH GFR     Status: Abnormal   Collection Time: 08/06/19 12:00 AM  Result Value Ref Range   Glucose, Bld 118 (H) 65 - 99 mg/dL    Comment: .            Fasting reference interval . For someone without known diabetes, a glucose value between 100 and 125 mg/dL is consistent with prediabetes and should be confirmed with a follow-up test. .    BUN 27 (H) 7 - 25 mg/dL   Creat 2.40 (H) 0.70 - 1.25 mg/dL    Comment: For patients >39 years of age, the reference limit for Creatinine is approximately 13% higher for people identified as African-American. .    GFR, Est Non African American 27 (L) > OR = 60 mL/min/1.55m2   GFR, Est African American 31 (L) > OR = 60 mL/min/1.13m2   BUN/Creatinine Ratio 11 6 - 22 (calc)   Sodium 136 135 - 146 mmol/L   Potassium 3.8 3.5 - 5.3 mmol/L   Chloride 103 98 - 110 mmol/L   CO2 21 20 - 32 mmol/L   Calcium 10.3 8.6 - 10.3 mg/dL   Total Protein 7.0 6.1 - 8.1 g/dL   Albumin 4.4 3.6 - 5.1 g/dL   Globulin 2.6 1.9 - 3.7 g/dL (calc)   AG Ratio 1.7 1.0 - 2.5 (calc)   Total Bilirubin 0.6 0.2 - 1.2 mg/dL   Alkaline phosphatase (APISO) 81 35 - 144 U/L   AST 10 10 - 35 U/L    ALT 7 (L) 9 - 46 U/L      Psychiatric Specialty Exam: Physical Exam  ROS  There were no vitals taken for this visit.There is no height or weight on file to calculate BMI.  General Appearance: NA  Eye Contact:  NA  Speech:  Slow  Volume:  Decreased  Mood:  Euthymic  Affect:  NA  Thought Process:  Descriptions of Associations: Intact  Orientation:  Full (Time, Place, and Person)  Thought Content:  Logical  Suicidal Thoughts:  No  Homicidal Thoughts:  No  Memory:  Immediate;   Fair Recent;   Fair Remote;   Fair  Judgement:  Good  Insight:  Present  Psychomotor Activity:  NA  Concentration:  Concentration: Fair and Attention Span: Fair  Recall:  Fair  Fund of Knowledge:  Good  Language:  Good  Akathisia:  No  Handed:  Right  AIMS (if indicated):     Assets:  Communication Skills Desire for Improvement Housing Resilience Social Support  ADL's:  Intact  Cognition:  WNL  Sleep:   ok      Assessment and Plan: Bipolar disorder type I.  Generalized anxiety disorder.  I reviewed blood work results.  His hemoglobin A1c is normal.  His BUN/creatinine is a stable.  I recommend discontinue trazodone which we have done the last visit but he received the refill from his pharmacy.  I discussed medication side effect specially sleeping too much and dizziness if he continues take the trazodone.  So far he is compliant with other medication and doing very well.  I will continue Zyprexa 20 mg at bedtime, Lamictal 200 mg daily, Xanax 0.5 mg 3 times a day.  Patient does not ask early refills for his benzodiazepine.  Recommended to call us back if is any question of any concern.  Follow-up in 6 months.    Follow Up Instructions:    I discussed the assessment and treatment plan with the patient. The patient was provided an opportunity to ask questions and all were answered. The patient agreed with the plan and demonstrated an understanding of the instructions.   The patient was advised  to call back or seek an in-person evaluation if the symptoms worsen or if the condition fails to improve as anticipated.  I provided 20 minutes of non-face-to-face time during this encounter.   [name], MD

## 2019-09-16 DIAGNOSIS — I420 Dilated cardiomyopathy: Secondary | ICD-10-CM | POA: Diagnosis not present

## 2019-09-16 DIAGNOSIS — I2510 Atherosclerotic heart disease of native coronary artery without angina pectoris: Secondary | ICD-10-CM | POA: Diagnosis not present

## 2019-09-16 DIAGNOSIS — I10 Essential (primary) hypertension: Secondary | ICD-10-CM | POA: Diagnosis not present

## 2019-09-16 DIAGNOSIS — I442 Atrioventricular block, complete: Secondary | ICD-10-CM | POA: Diagnosis not present

## 2019-10-01 ENCOUNTER — Telehealth: Payer: Self-pay | Admitting: Family Medicine

## 2019-10-01 DIAGNOSIS — N138 Other obstructive and reflux uropathy: Secondary | ICD-10-CM

## 2019-10-02 NOTE — Telephone Encounter (Signed)
Pt is scheduled °

## 2019-10-14 ENCOUNTER — Other Ambulatory Visit (HOSPITAL_COMMUNITY): Payer: Self-pay | Admitting: Psychiatry

## 2019-10-14 DIAGNOSIS — F319 Bipolar disorder, unspecified: Secondary | ICD-10-CM

## 2019-10-16 ENCOUNTER — Other Ambulatory Visit: Payer: Self-pay | Admitting: Family Medicine

## 2019-10-16 DIAGNOSIS — I495 Sick sinus syndrome: Secondary | ICD-10-CM

## 2019-10-16 DIAGNOSIS — I2510 Atherosclerotic heart disease of native coronary artery without angina pectoris: Secondary | ICD-10-CM

## 2019-10-29 ENCOUNTER — Ambulatory Visit: Payer: Medicare Other | Admitting: Family Medicine

## 2019-11-11 ENCOUNTER — Encounter: Payer: Self-pay | Admitting: Family Medicine

## 2019-11-11 ENCOUNTER — Ambulatory Visit (INDEPENDENT_AMBULATORY_CARE_PROVIDER_SITE_OTHER): Payer: Medicare Other | Admitting: Family Medicine

## 2019-11-11 ENCOUNTER — Other Ambulatory Visit: Payer: Self-pay

## 2019-11-11 DIAGNOSIS — I5022 Chronic systolic (congestive) heart failure: Secondary | ICD-10-CM | POA: Diagnosis not present

## 2019-11-11 DIAGNOSIS — I495 Sick sinus syndrome: Secondary | ICD-10-CM | POA: Diagnosis not present

## 2019-11-11 DIAGNOSIS — I2510 Atherosclerotic heart disease of native coronary artery without angina pectoris: Secondary | ICD-10-CM | POA: Diagnosis not present

## 2019-11-11 DIAGNOSIS — N401 Enlarged prostate with lower urinary tract symptoms: Secondary | ICD-10-CM | POA: Diagnosis not present

## 2019-11-11 DIAGNOSIS — F319 Bipolar disorder, unspecified: Secondary | ICD-10-CM | POA: Diagnosis not present

## 2019-11-11 DIAGNOSIS — I10 Essential (primary) hypertension: Secondary | ICD-10-CM | POA: Diagnosis not present

## 2019-11-11 DIAGNOSIS — N138 Other obstructive and reflux uropathy: Secondary | ICD-10-CM | POA: Diagnosis not present

## 2019-11-11 MED ORDER — ROSUVASTATIN CALCIUM 20 MG PO TABS: 20.0000 mg | ORAL_TABLET | Freq: Every day | ORAL | 1 refills | Status: DC

## 2019-11-11 MED ORDER — CLOPIDOGREL BISULFATE 75 MG PO TABS: 75.0000 mg | ORAL_TABLET | Freq: Every morning | ORAL | 1 refills | Status: DC

## 2019-11-11 MED ORDER — TAMSULOSIN HCL 0.4 MG PO CAPS: 0.4000 mg | ORAL_CAPSULE | Freq: Every morning | ORAL | 1 refills | Status: DC

## 2019-11-11 NOTE — Progress Notes (Signed)
Name: KAHLIN MARK   MRN: [id number]    DOB: 1951-07-01   Date:11/11/2019       Progress Note  Subjective  Chief Complaint  Chief Complaint  Patient presents with  . Medication Refill    Refill on Tamsulosin.    I connected with  [name] on 11/11/19 at 10:40 AM EST by telephone and verified that I am speaking with the correct person using two identifiers.  I discussed the limitations, risks, security and privacy concerns of performing an evaluation and management service by telephone and the availability of in person appointments. Staff also discussed with the patient that there may be a patient responsible charge related to this service. Patient Location: home  Provider Location: [facility] Additional Individuals present: wife  HPI  CHF: he states he denies orthopnea this time, he always uses two pillows, he denieschest pain or palpitation, he has SOB when he walks to get the mail, but that is the only time he walks. He has a pacemaker. First one [phone], had some vegetation on pacemaker leads back in 2009 and is seeing Dr. Georg Ruddle at [facility], and pacemaker was replaced again 04/2019He had multiple replacements in between and currently located on abdominal wall because of scar tissue. He hasone vessel CAD and is on Plavix , statin therapy  off metoprolol because it was causing too much fatigue, he  is allergic to aspirin.   CKI: Currently seeing Dr. Sydell Axon, last visit was also virtual, no labs in a long time, he had labs done October 2020, pth went back to normal   Bipolar disorder I:still on Lamictal,Zyprexa, alprazolam .No recent manic episodes, Hehas beendisabled since age 39 . Unchanged   BPH: he has urinary frequency, taking Flomax to control symptoms. He has nocturia only once per night, stable.Discussed importance of getting yearly PSA   HTN: taking medication, no chest pain or palpitation.He has occasional  SOB, discussed deconditioning. His BP during EC visit in 08/10/2019 140/82   Hyperlipidemia: he is on Atorvastatin and reviewed last labs   Patient Active Problem List   Diagnosis Date Noted  . Nasal congestion with rhinorrhea 11/06/2018  . Chronic kidney disease, stage 4 (severe) ([place]) 10/24/2017  . Dilated cardiomyopathy ([place]) 02/18/2017  . BPH (benign prostatic hyperplasia) 11/04/2015  . Chronic kidney disease (CKD), stage III (moderate) 10/28/2015  . Chronic systolic heart failure ([place]) 10/28/2015  . HLD (hyperlipidemia) 10/28/2015  . H/O deep venous thrombosis 10/28/2015  . History of sepsis 04/09/2014  . Inguinal hernia 01/06/2014  . Complete atrioventricular block ([place]) 10/14/2013  . Sinoatrial node dysfunction ([place]) 10/14/2013  . Kidney cysts 05/06/2013  . Hypertension, benign 10/01/2012  . Healed or old pulmonary embolism 09/30/2012  . Artificial cardiac pacemaker 09/30/2012  . Bipolar 1 disorder ([place]) 05/01/2012  . Arteriosclerosis of coronary artery 06/16/2009    Past Surgical History:  Procedure Laterality Date  . CARDIAC SURGERY    . HERNIA REPAIR Right 02/05/14  . NOSE SURGERY    . PACEMAKER PLACEMENT    . SPINE SURGERY      Family History  Problem Relation Age of Onset  . Depression Mother       Current Outpatient Medications:  .  ALPRAZolam (XANAX) 0.5 MG tablet, Take 1 tablet (0.5 mg total) by mouth 3 (three) times daily., Disp: 90 tablet, Rfl: 5 .  cholecalciferol (VITAMIN D) 1000 units tablet, Take 1,000 Units by mouth daily., Disp: , Rfl:  .  clopidogrel (PLAVIX)  75 MG tablet, TAKE 1 TABLET (75 MG TOTAL) BY MOUTH EVERY MORNING., Disp: 90 tablet, Rfl: 0 .  hydrALAZINE (APRESOLINE) 10 MG tablet, Take 1 tablet by mouth 2 (two) times a day., Disp: , Rfl:  .  lamoTRIgine (LAMICTAL) 200 MG tablet, Take 1 tablet (200 mg total) by mouth daily., Disp: 90 tablet, Rfl: 1 .  OLANZapine (ZYPREXA) 20 MG tablet, TAKE 1 TABLET BY MOUTH ATBEDTIME., Disp: 90 tablet,  Rfl: 1 .  rosuvastatin (CRESTOR) 20 MG tablet, Take 1 tablet (20 mg total) by mouth daily., Disp: 90 tablet, Rfl: 1 .  tamsulosin (FLOMAX) 0.4 MG CAPS capsule, TAKE 1 CAPSULE (0.4 MG TOTAL) BY MOUTH EVERY MORNING., Disp: 90 capsule, Rfl: 0 .  metoprolol succinate (TOPROL-XL) 25 MG 24 hr tablet, Take 1 tablet (25 mg total) by mouth daily. (Patient not taking: Reported on 11/11/2019), Disp: 90 tablet, Rfl: 1 .  sulfamethoxazole-trimethoprim (BACTRIM DS) 800-160 MG tablet, Take 1 tablet by mouth 2 (two) times daily. (Patient not taking: Reported on 11/11/2019), Disp: 20 tablet, Rfl: 0  Allergies  Allergen Reactions  . Aspirin Hives, Shortness Of Breath and Palpitations  . Codeine Nausea And Vomiting  . Demerol [Meperidine] Nausea And Vomiting  . Lithium      acute renal failure    I personally reviewed active problem list, medication list, allergies, family history, social history, health maintenance with the patient/caregiver today.   ROS  Ten systems reviewed and is negative except as mentioned in HPI   Objective  Virtual encounter, vitals not obtained.  There is no height or weight on file to calculate BMI.  Physical Exam  Awake, alert and oriented  PHQ2/9: Depression screen [facility] 2/9 11/11/2019 05/16/2019 05/01/2019  Decreased Interest 0 0 0  Down, Depressed, Hopeless 0 0 0  PHQ - 2 Score 0 0 0  Altered sleeping 0 - 0  Tired, decreased energy 0 - 0  Change in appetite 0 - 0  Feeling bad or failure about yourself  0 - 0  Trouble concentrating 0 - 0  Moving slowly or fidgety/restless 0 - 0  Suicidal thoughts 0 - 0  PHQ-9 Score 0 - 0  Some encounter information is confidential and restricted. Go to Review Flowsheets activity to see all data.   PHQ-2/9 Result is negative.    Fall Risk: Fall Risk  11/11/2019 05/16/2019 05/01/2019 04/23/2018  Falls in the past year? 0 0 0 -  Number falls in past yr: 0 0 0 -  Injury with Fall? 0 0 0 -  Risk for fall due to: Comment - - - wears  eyeglasses; shuffling gait; Bipolar  Follow up - Falls prevention discussed - -  Some encounter information is confidential and restricted. Go to Review Flowsheets activity to see all data.     Assessment & Plan  1. Benign prostatic hyperplasia with urinary obstruction  - tamsulosin (FLOMAX) 0.4 MG CAPS capsule; Take 1 capsule (0.4 mg total) by mouth every morning.  Dispense: 90 capsule; Refill: 1  2. ASCVD (arteriosclerotic cardiovascular disease)  - rosuvastatin (CRESTOR) 20 MG tablet; Take 1 tablet (20 mg total) by mouth daily.  Dispense: 90 tablet; Refill: 1 - clopidogrel (PLAVIX) 75 MG tablet; Take 1 tablet (75 mg total) by mouth every morning.  Dispense: 90 tablet; Refill: 1  3. Sinoatrial node dysfunction (HCC)  - clopidogrel (PLAVIX) 75 MG tablet; Take 1 tablet (75 mg total) by mouth every morning.  Dispense: 90 tablet; Refill: 1  4. Essential hypertension  5. Bipolar 1 disorder ([place])   6. Chronic systolic heart failure ([place])   I discussed the assessment and treatment plan with the patient. The patient was provided an opportunity to ask questions and all were answered. The patient agreed with the plan and demonstrated an understanding of the instructions.   The patient was advised to call back or seek an in-person evaluation if the symptoms worsen or if the condition fails to improve as anticipated.  I provided 25  minutes of non-face-to-face time during this encounter.  Loistine Chance, MD

## 2019-12-12 ENCOUNTER — Ambulatory Visit: Payer: Medicare Other | Attending: Internal Medicine

## 2019-12-12 DIAGNOSIS — Z23 Encounter for immunization: Secondary | ICD-10-CM | POA: Insufficient documentation

## 2019-12-12 NOTE — Progress Notes (Signed)
   Covid-19 Vaccination Clinic  Name:  Jeffrey Herring    MRN: [id number] DOB: 1951/03/07  12/12/2019  [name] was observed post Covid-19 immunization for 15 minutes without incidence. He was provided with Vaccine Information Sheet and instruction to access the V-Safe system.   [name] was instructed to call 911 with any severe reactions post vaccine: Marland Kitchen Difficulty breathing  . Swelling of your face and throat  . A fast heartbeat  . A bad rash all over your body  . Dizziness and weakness    Immunizations Administered    Name Date Dose VIS Date Route   Pfizer COVID-19 Vaccine 12/12/2019 11:56 AM 0.3 mL 09/26/2019 Intramuscular   Manufacturer: [place]   Lot: [id number]   [place]: [address]-1

## 2019-12-23 DIAGNOSIS — I442 Atrioventricular block, complete: Secondary | ICD-10-CM | POA: Diagnosis not present

## 2019-12-23 DIAGNOSIS — Z950 Presence of cardiac pacemaker: Secondary | ICD-10-CM | POA: Diagnosis not present

## 2020-01-07 ENCOUNTER — Ambulatory Visit: Payer: Medicare Other | Attending: Internal Medicine

## 2020-01-07 DIAGNOSIS — Z23 Encounter for immunization: Secondary | ICD-10-CM

## 2020-01-07 NOTE — Progress Notes (Signed)
   Covid-19 Vaccination Clinic  Name:  HENDRYX RICKE    MRN: [id number] DOB: 10-27-1950  01/07/2020  Mr. Shellhammer was observed post Covid-19 immunization for 15 minutes without incident. He was provided with Vaccine Information Sheet and instruction to access the V-Safe system.   Mr. Holzhauer was instructed to call 911 with any severe reactions post vaccine: Marland Kitchen Difficulty breathing  . Swelling of face and throat  . A fast heartbeat  . A bad rash all over body  . Dizziness and weakness   Immunizations Administered    Name Date Dose VIS Date Route   Pfizer COVID-19 Vaccine 01/07/2020  3:39 PM 0.3 mL 09/26/2019 Intramuscular   Manufacturer: [place]   Lot: [id number]   [place]: [address]-6

## 2020-02-23 ENCOUNTER — Encounter (HOSPITAL_COMMUNITY): Payer: Self-pay | Admitting: Psychiatry

## 2020-02-23 ENCOUNTER — Other Ambulatory Visit: Payer: Self-pay

## 2020-02-23 ENCOUNTER — Telehealth (INDEPENDENT_AMBULATORY_CARE_PROVIDER_SITE_OTHER): Payer: Medicare Other | Admitting: Psychiatry

## 2020-02-23 DIAGNOSIS — F319 Bipolar disorder, unspecified: Secondary | ICD-10-CM | POA: Diagnosis not present

## 2020-02-23 DIAGNOSIS — F411 Generalized anxiety disorder: Secondary | ICD-10-CM

## 2020-02-23 DIAGNOSIS — I2510 Atherosclerotic heart disease of native coronary artery without angina pectoris: Secondary | ICD-10-CM

## 2020-02-23 MED ORDER — OLANZAPINE 20 MG PO TABS: ORAL_TABLET | ORAL | 1 refills | Status: DC

## 2020-02-23 MED ORDER — LAMOTRIGINE 200 MG PO TABS: 200.0000 mg | ORAL_TABLET | Freq: Every day | ORAL | 1 refills | Status: DC

## 2020-02-23 MED ORDER — ALPRAZOLAM 0.5 MG PO TABS: 0.5000 mg | ORAL_TABLET | Freq: Three times a day (TID) | ORAL | 5 refills | Status: DC

## 2020-02-23 NOTE — Progress Notes (Signed)
Virtual Visit via Telephone Note  I connected with Jeffrey Herring on 02/23/20 at  2:00 PM EDT by telephone and verified that I am speaking with the correct person using two identifiers.   I discussed the limitations, risks, security and privacy concerns of performing an evaluation and management service by telephone and the availability of in person appointments. I also discussed with the patient that there may be a patient responsible charge related to this service. The patient expressed understanding and agreed to proceed.   History of Present Illness: Patient is evaluated by phone session.  He is taking his medication as prescribed.  His wife also close by.  He mentioned that there is a rash he noticed in the past 6 weeks which is on his lack but he does not cause any itching, redness, or hurt.  He is not sure about the rash but like to see his PCP to check it.  He has been taking Lamictal for a while and so far he has no issues and he is on current dose of medication for a long time.  He also does not believe it is caused by Lamictal.  Do not recall any changes in his medication.  He feels his anxiety, mood irritability, highs and lows is stable.  He sleeps good.  His appetite is okay.  He has chronic renal insufficiency.  He does not want to change medication.  Past Psychiatric History:Reviewed. H/O bipolar disorder. H/O inpatient mania and psychosis. Good response with lithium until had a lithium toxicity. Took Abilify, Risperdal, Prozac, Lexapro, Wellbutrin and Navane.   Psychiatric Specialty Exam: Physical Exam  Review of Systems  Skin: Positive for rash.       Chronic rash on leg but denies itching, inflammation, redness    There were no vitals taken for this visit.There is no height or weight on file to calculate BMI.  General Appearance: NA  Eye Contact:  NA  Speech:  Slow  Volume:  Decreased  Mood:  Euthymic  Affect:  NA  Thought Process:  Descriptions of Associations:  Intact  Orientation:  Full (Time, Place, and Person)  Thought Content:  Logical  Suicidal Thoughts:  No  Homicidal Thoughts:  No  Memory:  Immediate;   Fair Recent;   Fair Remote;   Fair  Judgement:  Fair  Insight:  Present  Psychomotor Activity:  NA  Concentration:  Concentration: Fair and Attention Span: Fair  Recall:  [employer] of Knowledge:  Fair  Language:  Fair  Akathisia:  No  Handed:  Right  AIMS (if indicated):     Assets:  Communication Skills Desire for Improvement Housing Social Support  ADL's:  Intact  Cognition:  WNL  Sleep:   ok      Assessment and Plan: Bipolar disorder type I.  Generalized anxiety disorder.  Patient is a stable on his medication other than he has noticed rash for more than 6 weeks.  He is not sure what has caused since he does not report any pain, itching, redness.  I encouraged that he should see his PCP which he is going to do anyway.  He does not want to change medication.  I will continue Zyprexa 20 mg at bedtime, Lamictal 200 mg daily and Xanax 0.5 mg 3 times a day.  He is taking Lamictal same dose for a while.  Discussed medication side effects and benefits.  Recommended to call us back if he has any questions or any concerns.  Follow-up in 6 months.  Follow Up Instructions:    I discussed the assessment and treatment plan with the patient. The patient was provided an opportunity to ask questions and all were answered. The patient agreed with the plan and demonstrated an understanding of the instructions.   The patient was advised to call back or seek an in-person evaluation if the symptoms worsen or if the condition fails to improve as anticipated.  I provided 20 minutes of non-face-to-face time during this encounter.   [name], MD

## 2020-03-30 DIAGNOSIS — I442 Atrioventricular block, complete: Secondary | ICD-10-CM | POA: Diagnosis not present

## 2020-05-11 ENCOUNTER — Encounter: Payer: Medicare Other | Admitting: Family Medicine

## 2020-05-20 ENCOUNTER — Ambulatory Visit: Payer: Medicare Other

## 2020-05-31 DIAGNOSIS — I2510 Atherosclerotic heart disease of native coronary artery without angina pectoris: Secondary | ICD-10-CM | POA: Diagnosis not present

## 2020-05-31 DIAGNOSIS — I442 Atrioventricular block, complete: Secondary | ICD-10-CM | POA: Diagnosis not present

## 2020-05-31 DIAGNOSIS — E7800 Pure hypercholesterolemia, unspecified: Secondary | ICD-10-CM | POA: Diagnosis not present

## 2020-05-31 DIAGNOSIS — I420 Dilated cardiomyopathy: Secondary | ICD-10-CM | POA: Diagnosis not present

## 2020-06-16 ENCOUNTER — Telehealth: Payer: Self-pay | Admitting: *Deleted

## 2020-06-16 NOTE — Chronic Care Management (AMB) (Signed)
  Chronic Care Management   Note  06/16/2020 Name: Jeffrey Herring MRN: [id number] DOB: 02/18/1951  Jeffrey Herring is a 69 y.o. year old male who is a primary care patient of Steele Sizer, MD. I reached out to [name] by phone today in response to a referral sent by Jeffrey Herring [facility] health plan.     Jeffrey Herring was given information about Chronic Care Management services today including:  1. CCM service includes personalized support from designated clinical staff supervised by his physician, including individualized plan of care and coordination with other care providers 2. 24/7 contact phone numbers for assistance for urgent and routine care needs. 3. Service will only be billed when office clinical staff spend 20 minutes or more in a month to coordinate care. 4. Only one practitioner may furnish and bill the service in a calendar month. 5. The patient may stop CCM services at any time (effective at the end of the month) by phone call to the office staff. 6. The patient will be responsible for cost sharing (co-pay) of up to 20% of the service fee (after annual deductible is met).  Patient did not agree to enrollment in care management services and does not wish to consider at this time.  Follow up plan: The care management team is available to follow up with the patient after provider conversation with the patient regarding recommendation for care management engagement and subsequent re-referral to the care management team. The patient has been provided with contact information for the care management team and has been advised to call with any health related questions or concerns.   [facility]

## 2020-06-17 DIAGNOSIS — I420 Dilated cardiomyopathy: Secondary | ICD-10-CM | POA: Diagnosis not present

## 2020-07-02 ENCOUNTER — Telehealth: Payer: Self-pay

## 2020-07-02 NOTE — Telephone Encounter (Signed)
Copied from [place] [phone]. Topic: General - Other >> Jul 02, 2020  1:07 PM Leward Quan A wrote: Reason for CRM: Patient wife Latanya Presser called to inquire about a message that was left on the VM in regards to the patients upcoming appointment on Tuesday 07/06/20. She state that he need to be seen for his annual physical but was told that patient will not have a physical due to something with his [employer] coverage. Dirk Dress would like a call back from Dr Ancil Boozer nurse to clarify why he is coming in the office if not for his annual check up. Can be reached at Ph# [phone]

## 2020-07-02 NOTE — Telephone Encounter (Signed)
Spoke with wife and informed her that medicare does not cover physicals that the appt has been switched to 42m fu so that he can get any med refills and that it should be a face to face encounter.

## 2020-07-02 NOTE — Progress Notes (Signed)
Name: Jeffrey Herring   MRN: [id number]    DOB: [birth year]   Date:07/06/2020       Progress Note  Subjective  Chief Complaint  Chief Complaint  Patient presents with  . Follow-up    HPI  CHF: under the care of Dr. Prince Rome at [facility]., EF is down to 25-30 % he states he denies orthopnea this time, he always uses two pillows, he denieschest pain or palpitation, he has SOB with minimal exertion, unable to go shopping with his wife because of increase in SOB. lHe has a pacemaker.First one [phone], had some vegetation on pacemaker leads back in 2009 and also sees Dr. Georg Ruddle at [facility] for  pacemaker was replaced again 04/2019He had multiple replacements in between and currently located on abdominal wall because of scar tissue. He hasone vessel CAD and is on Plavix , statin therapy  off metoprolol because it was causing too much fatigue, he  is allergic to aspirin. He is on Hydralazine   CKI: Currently under the care of [facility] nephrologist,  he had labs done October 2020, pth went back to normal , he has stage IV chronic kidney disease, due for follow up with nephrologist .   Bipolar disorder I:still on Lamictal,Zyprexa, alprazolam .No recent manic episodes,Hehas beendisabled since age 70 . He came in with his wife, he states he is feeling well, mood has been leveled out. He does not have much motivation   BPH: he has urinary frequency, taking Flomax to control symptoms. He has nocturia only once per night, stable.Discussed importance of getting yearly PSA . Before Flomax he had urinary frequency and urgency during the day, but that has been well controlled   HTN: taking medication, no chest pain or palpitation.He has occasional SOB, discussed deconditioning. His BP today is at goal. He takes hydralazine but not sure who prescribes, wife thinks the cardiologist   Hyperlipidemia: he is on Rosuvastatin , denies myalgia, we will recheck level    Patient Active Problem List    Diagnosis Date Noted  . Hyperparathyroidism, secondary renal ([place]) 07/06/2020  . Nasal congestion with rhinorrhea 11/06/2018  . Chronic kidney disease, stage 4 (severe) ([place]) 10/24/2017  . Dilated cardiomyopathy ([place]) 02/18/2017  . BPH (benign prostatic hyperplasia) 11/04/2015  . Chronic kidney disease (CKD), stage III (moderate) 10/28/2015  . Chronic systolic heart failure ([place]) 10/28/2015  . HLD (hyperlipidemia) 10/28/2015  . H/O deep venous thrombosis 10/28/2015  . History of sepsis 04/09/2014  . Inguinal hernia 01/06/2014  . Fitting or adjustment of cardiac pacemaker 01/05/2014  . Complete atrioventricular block ([place]) 10/14/2013  . Sinoatrial node dysfunction ([place]) 10/14/2013  . Kidney cysts 05/06/2013  . Hypertension, benign 10/01/2012  . Healed or old pulmonary embolism 09/30/2012  . Artificial cardiac pacemaker 09/30/2012  . Bipolar 1 disorder ([place]) 05/01/2012  . Arteriosclerosis of coronary artery 06/16/2009    Past Surgical History:  Procedure Laterality Date  . CARDIAC SURGERY    . HERNIA REPAIR Right 02/05/14  . NOSE SURGERY    . PACEMAKER PLACEMENT    . SPINE SURGERY      Family History  Problem Relation Age of Onset  . Depression Mother     Social History   Tobacco Use  . Smoking status: Former Smoker    Years: 40.00    Types: Cigars    Quit date: 02/05/2018    Years since quitting: 2.4  . Smokeless tobacco: Never Used  . Tobacco comment: smoking cessation materials not required  Substance  Use Topics  . Alcohol use: No    Alcohol/week: 0.0 standard drinks     Current Outpatient Medications:  .  ALPRAZolam (XANAX) 0.5 MG tablet, Take 1 tablet (0.5 mg total) by mouth 3 (three) times daily., Disp: 90 tablet, Rfl: 5 .  cholecalciferol (VITAMIN D) 1000 units tablet, Take 1,000 Units by mouth daily., Disp: , Rfl:  .  clopidogrel (PLAVIX) 75 MG tablet, Take 1 tablet (75 mg total) by mouth every morning., Disp: 90 tablet, Rfl: 1 .  hydrALAZINE  (APRESOLINE) 10 MG tablet, Take 1 tablet by mouth 2 (two) times a day., Disp: , Rfl:  .  lamoTRIgine (LAMICTAL) 200 MG tablet, Take 1 tablet (200 mg total) by mouth daily., Disp: 90 tablet, Rfl: 1 .  OLANZapine (ZYPREXA) 20 MG tablet, TAKE 1 TABLET BY MOUTH ATBEDTIME., Disp: 90 tablet, Rfl: 1 .  rosuvastatin (CRESTOR) 20 MG tablet, Take 1 tablet (20 mg total) by mouth daily., Disp: 90 tablet, Rfl: 1 .  tamsulosin (FLOMAX) 0.4 MG CAPS capsule, Take 1 capsule (0.4 mg total) by mouth every morning., Disp: 90 capsule, Rfl: 1  Allergies  Allergen Reactions  . Aspirin Hives, Shortness Of Breath and Palpitations  . Codeine Nausea And Vomiting  . Demerol [Meperidine] Nausea And Vomiting  . Lithium      acute renal failure    I personally reviewed active problem list, medication list, allergies, family history, social history, health maintenance with the patient/caregiver today.   ROS  Constitutional: Negative for fever or weight change.  Respiratory: Negative for cough , positive for shortness of breath.   Cardiovascular: Negative for chest pain or palpitations.  Gastrointestinal: Negative for abdominal pain, no bowel changes.  Musculoskeletal: Negative for gait problem or joint swelling.  Skin: Negative for rash.  Neurological: Negative for dizziness or headache.  No other specific complaints in a complete review of systems (except as listed in HPI above).  Objective  Vitals:   07/06/20 1333  BP: 120/78  Pulse: 95  Resp: 20  Temp: 98.2 F (36.8 C)  TempSrc: Oral  SpO2: 95%  Weight: 175 lb (79.4 kg)  Height: 5\' 10"  (1.778 m)    Body mass index is 25.11 kg/m.  Physical Exam  Constitutional: Patient appears well-developed and well-nourished.  No distress.  HEENT: head atraumatic, normocephalic, pupils equal and reactive to light,neck supple Cardiovascular: Normal rate, regular rhythm and normal heart sounds.  No murmur heard. No BLE edema. Pulmonary/Chest: Effort normal and  breath sounds normal. No respiratory distress. Abdominal: Soft.  There is palpable pacemaker on LUQ area  Psychiatric: Patient has a flat affect . behavior is normal. Judgment and thought content normal.  PHQ2/9: Depression screen [facility] 2/9 07/06/2020 11/11/2019 05/16/2019 05/01/2019  Decreased Interest 0 0 0 0  Down, Depressed, Hopeless 0 0 0 0  PHQ - 2 Score 0 0 0 0  Altered sleeping - 0 - 0  Tired, decreased energy - 0 - 0  Change in appetite - 0 - 0  Feeling bad or failure about yourself  - 0 - 0  Trouble concentrating - 0 - 0  Moving slowly or fidgety/restless - 0 - 0  Suicidal thoughts - 0 - 0  PHQ-9 Score - 0 - 0  Some encounter information is confidential and restricted. Go to Review Flowsheets activity to see all data.    phq 9 is negative   Fall Risk: Fall Risk  07/06/2020 11/11/2019 05/16/2019 05/01/2019 04/23/2018  Falls in the past year? 0 0  0 0 -  Number falls in past yr: 0 0 0 0 -  Injury with Fall? 0 0 0 0 -  Risk for fall due to: Comment - - - - wears eyeglasses; shuffling gait; Bipolar  Follow up - - Falls prevention discussed - -  Some encounter information is confidential and restricted. Go to Review Flowsheets activity to see all data.     Functional Status Survey: Is the patient deaf or have difficulty hearing?: No Does the patient have difficulty seeing, even when wearing glasses/contacts?: No Does the patient have difficulty concentrating, remembering, or making decisions?: No Does the patient have difficulty walking or climbing stairs?: No Does the patient have difficulty dressing or bathing?: No Does the patient have difficulty doing errands alone such as visiting a doctor's office or shopping?: No    Assessment & Plan  1. Essential hypertension  - COMPLETE METABOLIC PANEL WITH GFR - CBC with Differential/Platelet  2. Bipolar 1 disorder ([place])  Doing well   3. Benign prostatic hyperplasia with urinary obstruction  - PSA  4. ASCVD  (arteriosclerotic cardiovascular disease)  - Lipid panel  5. Chronic systolic heart failure (HCC)  EF has dropped   6. Mixed hyperlipidemia  - Lipid panel  7. Encounter for hepatitis C screening test for low risk patient  - Hepatitis C Antibody  8. Need for immunization against influenza  - Flu Vaccine QUAD High Dose(Fluad)  9. Chronic kidney disease, stage 4 (severe) (HCC)  - Microalbumin / creatinine urine ratio - VITAMIN D 25 Hydroxy (Vit-D Deficiency, Fractures)  10 Hyperglycemia  - Hemoglobin A1c  11. Sinoatrial node dysfunction (HCC)  Doing well , under the care o

## 2020-07-06 ENCOUNTER — Encounter: Payer: Self-pay | Admitting: Family Medicine

## 2020-07-06 ENCOUNTER — Ambulatory Visit (INDEPENDENT_AMBULATORY_CARE_PROVIDER_SITE_OTHER): Payer: Medicare Other | Admitting: Family Medicine

## 2020-07-06 ENCOUNTER — Other Ambulatory Visit: Payer: Self-pay

## 2020-07-06 VITALS — BP 120/78 | HR 95 | Temp 98.2°F | Resp 20 | Ht 70.0 in | Wt 175.0 lb

## 2020-07-06 DIAGNOSIS — N138 Other obstructive and reflux uropathy: Secondary | ICD-10-CM

## 2020-07-06 DIAGNOSIS — I495 Sick sinus syndrome: Secondary | ICD-10-CM

## 2020-07-06 DIAGNOSIS — Z1159 Encounter for screening for other viral diseases: Secondary | ICD-10-CM

## 2020-07-06 DIAGNOSIS — E782 Mixed hyperlipidemia: Secondary | ICD-10-CM | POA: Diagnosis not present

## 2020-07-06 DIAGNOSIS — I10 Essential (primary) hypertension: Secondary | ICD-10-CM | POA: Diagnosis not present

## 2020-07-06 DIAGNOSIS — N2581 Secondary hyperparathyroidism of renal origin: Secondary | ICD-10-CM | POA: Insufficient documentation

## 2020-07-06 DIAGNOSIS — F319 Bipolar disorder, unspecified: Secondary | ICD-10-CM | POA: Diagnosis not present

## 2020-07-06 DIAGNOSIS — N401 Enlarged prostate with lower urinary tract symptoms: Secondary | ICD-10-CM | POA: Diagnosis not present

## 2020-07-06 DIAGNOSIS — R739 Hyperglycemia, unspecified: Secondary | ICD-10-CM

## 2020-07-06 DIAGNOSIS — I442 Atrioventricular block, complete: Secondary | ICD-10-CM | POA: Diagnosis not present

## 2020-07-06 DIAGNOSIS — I2510 Atherosclerotic heart disease of native coronary artery without angina pectoris: Secondary | ICD-10-CM

## 2020-07-06 DIAGNOSIS — Z23 Encounter for immunization: Secondary | ICD-10-CM

## 2020-07-06 DIAGNOSIS — N184 Chronic kidney disease, stage 4 (severe): Secondary | ICD-10-CM | POA: Diagnosis not present

## 2020-07-06 DIAGNOSIS — I5022 Chronic systolic (congestive) heart failure: Secondary | ICD-10-CM

## 2020-07-06 MED ORDER — ROSUVASTATIN CALCIUM 20 MG PO TABS: 20.0000 mg | ORAL_TABLET | Freq: Every day | ORAL | 1 refills | Status: DC

## 2020-07-06 MED ORDER — TAMSULOSIN HCL 0.4 MG PO CAPS: 0.4000 mg | ORAL_CAPSULE | Freq: Every morning | ORAL | 1 refills | Status: DC

## 2020-07-06 MED ORDER — CLOPIDOGREL BISULFATE 75 MG PO TABS: 75.0000 mg | ORAL_TABLET | Freq: Every morning | ORAL | 1 refills | Status: DC

## 2020-07-07 LAB — COMPLETE METABOLIC PANEL WITH GFR
AG Ratio: 1.6 (calc) (ref 1.0–2.5)
ALT: 8 U/L — ABNORMAL LOW (ref 9–46)
AST: 12 U/L (ref 10–35)
Albumin: 4.7 g/dL (ref 3.6–5.1)
Alkaline phosphatase (APISO): 85 U/L (ref 35–144)
BUN/Creatinine Ratio: 10 (calc) (ref 6–22)
BUN: 26 mg/dL — ABNORMAL HIGH (ref 7–25)
CO2: 17 mmol/L — ABNORMAL LOW (ref 20–32)
Calcium: 10.7 mg/dL — ABNORMAL HIGH (ref 8.6–10.3)
Chloride: 102 mmol/L (ref 98–110)
Creat: 2.63 mg/dL — ABNORMAL HIGH (ref 0.70–1.18)
GFR, Est African American: 27 mL/min/{1.73_m2} — ABNORMAL LOW (ref >=60)
GFR, Est Non African American: 24 mL/min/{1.73_m2} — ABNORMAL LOW (ref >=60)
Globulin: 3 g/dL (calc) (ref 1.9–3.7)
Glucose, Bld: 107 mg/dL (ref 65–139)
Potassium: 3.8 mmol/L (ref 3.5–5.3)
Sodium: 137 mmol/L (ref 135–146)
Total Bilirubin: 0.8 mg/dL (ref 0.2–1.2)
Total Protein: 7.7 g/dL (ref 6.1–8.1)

## 2020-07-07 LAB — CBC WITH DIFFERENTIAL/PLATELET
Absolute Monocytes: 950 cells/uL (ref 200–950)
Basophils Absolute: 50 cells/uL (ref 0–200)
Basophils Relative: 0.5 %
Eosinophils Absolute: 40 cells/uL (ref 15–500)
Eosinophils Relative: 0.4 %
HCT: 45.3 % (ref 38.5–50.0)
Hemoglobin: 15.3 g/dL (ref 13.2–17.1)
Lymphs Abs: 900 cells/uL (ref 850–3900)
MCH: 30.5 pg (ref 27.0–33.0)
MCHC: 33.8 g/dL (ref 32.0–36.0)
MCV: 90.4 fL (ref 80.0–100.0)
MPV: 11 fL (ref 7.5–12.5)
Monocytes Relative: 9.5 %
Neutro Abs: 8060 cells/uL — ABNORMAL HIGH (ref 1500–7800)
Neutrophils Relative %: 80.6 %
Platelets: 153 10*3/uL (ref 140–400)
RBC: 5.01 10*6/uL (ref 4.20–5.80)
RDW: 13.5 % (ref 11.0–15.0)
Total Lymphocyte: 9 %
WBC: 10 10*3/uL (ref 3.8–10.8)

## 2020-07-07 LAB — HEPATITIS C ANTIBODY
Hepatitis C Ab: NONREACTIVE
SIGNAL TO CUT-OFF: 0.03 (ref <1.00)

## 2020-07-07 LAB — LIPID PANEL
Cholesterol: 152 mg/dL (ref <200)
HDL: 44 mg/dL (ref >=40)
LDL Cholesterol (Calc): 84 mg/dL (calc)
Non-HDL Cholesterol (Calc): 108 mg/dL (calc) (ref <130)
Total CHOL/HDL Ratio: 3.5 (calc) (ref <5.0)
Triglycerides: 147 mg/dL (ref <150)

## 2020-07-07 LAB — HEMOGLOBIN A1C
Hgb A1c MFr Bld: 5.4 % of total Hgb (ref <5.7)
Mean Plasma Glucose: 108 (calc)
eAG (mmol/L): 6 (calc)

## 2020-07-07 LAB — MICROALBUMIN / CREATININE URINE RATIO
Creatinine, Urine: 247 mg/dL (ref 20–320)
Microalb Creat Ratio: 87 mcg/mg creat — ABNORMAL HIGH (ref <30)
Microalb, Ur: 21.6 mg/dL

## 2020-07-07 LAB — VITAMIN D 25 HYDROXY (VIT D DEFICIENCY, FRACTURES): Vit D, 25-Hydroxy: 48 ng/mL (ref 30–100)

## 2020-07-07 LAB — PSA: PSA: 9.66 ng/mL — ABNORMAL HIGH (ref <=4.0)

## 2020-08-25 ENCOUNTER — Encounter (HOSPITAL_COMMUNITY): Payer: Self-pay | Admitting: Psychiatry

## 2020-08-25 ENCOUNTER — Telehealth (INDEPENDENT_AMBULATORY_CARE_PROVIDER_SITE_OTHER): Payer: Medicare Other | Admitting: Psychiatry

## 2020-08-25 ENCOUNTER — Other Ambulatory Visit: Payer: Self-pay

## 2020-08-25 DIAGNOSIS — F319 Bipolar disorder, unspecified: Secondary | ICD-10-CM

## 2020-08-25 DIAGNOSIS — I2510 Atherosclerotic heart disease of native coronary artery without angina pectoris: Secondary | ICD-10-CM | POA: Diagnosis not present

## 2020-08-25 DIAGNOSIS — F411 Generalized anxiety disorder: Secondary | ICD-10-CM | POA: Diagnosis not present

## 2020-08-25 MED ORDER — OLANZAPINE 20 MG PO TABS: ORAL_TABLET | ORAL | 1 refills | Status: DC

## 2020-08-25 MED ORDER — LAMOTRIGINE 200 MG PO TABS: 200.0000 mg | ORAL_TABLET | Freq: Every day | ORAL | 1 refills | Status: DC

## 2020-08-25 MED ORDER — ALPRAZOLAM 0.5 MG PO TABS: 0.5000 mg | ORAL_TABLET | Freq: Three times a day (TID) | ORAL | 5 refills | Status: DC

## 2020-08-25 NOTE — Progress Notes (Signed)
Virtual Visit via Telephone Note  I connected with Jeffrey Herring on 08/25/20 at  2:00 PM EST by telephone and verified that I am speaking with the correct person using two identifiers.  Location: Patient: Home Provider: Home Office   I discussed the limitations, risks, security and privacy concerns of performing an evaluation and management service by telephone and the availability of in person appointments. I also discussed with the patient that there may be a patient responsible charge related to this service. The patient expressed understanding and agreed to proceed.   History of Present Illness: Patient is evaluated by phone session.  His wife is also present on the session.  Patient is doing okay on his current medication.  He denies any mania, psychosis, hallucination.  His anxiety is under control.  He does not have any concerns or side effects from the medication.  Recently he had a visit to his PCP and he has labs drawn.  His creatinine is marginally elevated from the past.  He is seeing nephrologist at [facility].  Patient does not want to change the medication.  Patient and his wife does not want to change the medication.  He has chronic itching on his legs but denies any rash or any other concern from the medication.  His appetite and weight is unchanged from the past.  Sometimes he has lack of motivation to do things he does not like to drive but denies any crying spells, feeling of hopelessness.    Past Psychiatric History:Reviewed. H/Obipolar disorder. H/O inpatientmania and psychosis. Good response with lithium until had a lithium toxicity. Took Abilify, Risperdal, Prozac, Lexapro, Wellbutrin and Navane.  Recent Results (from the past 2160 hour(s))  Lipid panel     Status: None   Collection Time: 07/06/20  2:27 PM  Result Value Ref Range   Cholesterol 152 <200 mg/dL   HDL 44 > OR = 40 mg/dL   Triglycerides 147 <150 mg/dL   LDL Cholesterol (Calc) 84 mg/dL (calc)    Comment:  Reference range: <100 . Desirable range <100 mg/dL for primary prevention;   <70 mg/dL for patients with CHD or diabetic patients  with > or = 2 CHD risk factors. Marland Kitchen LDL-C is now calculated using the Martin-Hopkins  calculation, which is a validated novel method providing  better accuracy than the Friedewald equation in the  estimation of LDL-C.  Cresenciano Genre et al. Annamaria Helling. 1610;960(45): 2061-2068  (http://education.[facility].com/faq/FAQ164)    Total CHOL/HDL Ratio 3.5 <5.0 (calc)   Non-HDL Cholesterol (Calc) 108 <130 mg/dL (calc)    Comment: For patients with diabetes plus 1 major ASCVD risk  factor, treating to a non-HDL-C goal of <100 mg/dL  (LDL-C of <70 mg/dL) is considered a therapeutic  option.   COMPLETE METABOLIC PANEL WITH GFR     Status: Abnormal   Collection Time: 07/06/20  2:27 PM  Result Value Ref Range   Glucose, Bld 107 65 - 139 mg/dL    Comment: .        Non-fasting reference interval .    BUN 26 (H) 7 - 25 mg/dL   Creat 2.63 (H) 0.70 - 1.18 mg/dL    Comment: For patients >34 years of age, the reference limit for Creatinine is approximately 13% higher for people identified as African-American. .    GFR, Est Non African American 24 (L) > OR = 60 mL/min/1.36m2   GFR, Est African American 27 (L) > OR = 60 mL/min/1.32m2   BUN/Creatinine Ratio 10 6 -  22 (calc)   Sodium 137 135 - 146 mmol/L   Potassium 3.8 3.5 - 5.3 mmol/L   Chloride 102 98 - 110 mmol/L   CO2 17 (L) 20 - 32 mmol/L   Calcium 10.7 (H) 8.6 - 10.3 mg/dL   Total Protein 7.7 6.1 - 8.1 g/dL   Albumin 4.7 3.6 - 5.1 g/dL   Globulin 3.0 1.9 - 3.7 g/dL (calc)   AG Ratio 1.6 1.0 - 2.5 (calc)   Total Bilirubin 0.8 0.2 - 1.2 mg/dL   Alkaline phosphatase (APISO) 85 35 - 144 U/L   AST 12 10 - 35 U/L   ALT 8 (L) 9 - 46 U/L  Hepatitis C Antibody     Status: None   Collection Time: 07/06/20  2:27 PM  Result Value Ref Range   Hepatitis C Ab NON-REACTIVE NON-REACTI   SIGNAL TO CUT-OFF 0.03 <1.00     Comment: . HCV antibody was non-reactive. There is no laboratory  evidence of HCV infection. . In most cases, no further action is required. However, if recent HCV exposure is suspected, a test for HCV RNA (test code [id number]) is suggested. . For additional information please refer to http://education.[facility].com/faq/FAQ22v1 (This link is being provided for informational/ educational purposes only.) .   Microalbumin / creatinine urine ratio     Status: Abnormal   Collection Time: 07/06/20  2:27 PM  Result Value Ref Range   Creatinine, Urine 247 20 - 320 mg/dL   Microalb, Ur 21.6 mg/dL    Comment: Reference Range Not established    Microalb Creat Ratio 87 (H) <30 mcg/mg creat    Comment: . The ADA defines abnormalities in albumin excretion as follows: Marland Kitchen Albuminuria Category        Result (mcg/mg creatinine) . Normal to Mildly increased   <30 Moderately increased         30-299  Severely increased           > OR = 300 . The ADA recommends that at least two of three specimens collected within a 3-6 month period be abnormal before considering a patient to be within a diagnostic category.   VITAMIN D 25 Hydroxy (Vit-D Deficiency, Fractures)     Status: None   Collection Time: 07/06/20  2:27 PM  Result Value Ref Range   Vit D, 25-Hydroxy 48 30 - 100 ng/mL    Comment: Vitamin D Status         25-OH Vitamin D: . Deficiency:                    <20 ng/mL Insufficiency:             20 - 29 ng/mL Optimal:                 > or = 30 ng/mL . For 25-OH Vitamin D testing on patients on  D2-supplementation and patients for whom quantitation  of D2 and D3 fractions is required, the QuestAssureD(TM) 25-OH VIT D, (D2,D3), LC/MS/MS is recommended: order  code [id number] (patients >40yrs). See Note 1 . Note 1 . For additional information, please refer to  http://education.[facility].com/faq/FAQ199  (This link is being provided for informational/ educational purposes  only.)   CBC with Differential/Platelet     Status: Abnormal   Collection Time: 07/06/20  2:27 PM  Result Value Ref Range   WBC 10.0 3.8 - 10.8 Thousand/uL   RBC 5.01 4.20 - 5.80 Million/uL   Hemoglobin 15.3 13.2 - 17.1 g/dL  HCT 45.3 38 - 50 %   MCV 90.4 80.0 - 100.0 fL   MCH 30.5 27.0 - 33.0 pg   MCHC 33.8 32.0 - 36.0 g/dL   RDW 13.5 11.0 - 15.0 %   Platelets 153 140 - 400 Thousand/uL   MPV 11.0 7.5 - 12.5 fL   Neutro Abs 8,060 (H) 1,500 - 7,800 cells/uL   Lymphs Abs 900 850 - 3,900 cells/uL   Absolute Monocytes 950 200 - 950 cells/uL   Eosinophils Absolute 40 15.0 - 500.0 cells/uL   Basophils Absolute 50 0.0 - 200.0 cells/uL   Neutrophils Relative % 80.6 %   Total Lymphocyte 9.0 %   Monocytes Relative 9.5 %   Eosinophils Relative 0.4 %   Basophils Relative 0.5 %  Hemoglobin A1c     Status: None   Collection Time: 07/06/20  2:27 PM  Result Value Ref Range   Hgb A1c MFr Bld 5.4 <5.7 % of total Hgb    Comment: For the purpose of screening for the presence of diabetes: . <5.7%       Consistent with the absence of diabetes 5.7-6.4%    Consistent with increased risk for diabetes             (prediabetes) > or =6.5%  Consistent with diabetes . This assay result is consistent with a decreased risk of diabetes. . Currently, no consensus exists regarding use of hemoglobin A1c for diagnosis of diabetes in children. . According to American Diabetes Association (ADA) guidelines, hemoglobin A1c <7.0% represents optimal control in non-pregnant diabetic patients. Different metrics may apply to specific patient populations.  Standards of Medical Care in Diabetes(ADA). .    Mean Plasma Glucose 108 (calc)   eAG (mmol/L) 6.0 (calc)  PSA     Status: Abnormal   Collection Time: 07/06/20  2:27 PM  Result Value Ref Range   PSA 9.66 (H) < OR = 4.0 ng/mL    Comment: The total PSA value from this assay system is  standardized against the WHO standard. The test  result will be  approximately 20% lower when compared  to the equimolar-standardized total PSA (Beckman  Coulter). Comparison of serial PSA results should be  interpreted with this fact in mind. . This test was performed using the Siemens  chemiluminescent method. Values obtained from  different assay methods cannot be used interchangeably. PSA levels, regardless of value, should not be interpreted as absolute evidence of the presence or absence of disease.     Psychiatric Specialty Exam: Physical Exam  Review of Systems  Skin:       Chronic itching    Weight 175 lb (79.4 kg).There is no height or weight on file to calculate BMI.  General Appearance: NA  Eye Contact:  NA  Speech:  Slow  Volume:  Decreased  Mood:  Euthymic  Affect:  NA  Thought Process:  Descriptions of Associations: Intact  Orientation:  Full (Time, Place, and Person)  Thought Content:  Logical  Suicidal Thoughts:  No  Homicidal Thoughts:  No  Memory:  Immediate;   Fair Recent;   Fair Remote;   Fair  Judgement:  Intact  Insight:  Present  Psychomotor Activity:  NA  Concentration:  Concentration: Fair and Attention Span: Fair  Recall:  [employer] of Knowledge:  Fair  Language:  Good  Akathisia:  No  Handed:  Right  AIMS (if indicated):     Assets:  Communication Skills Desire for [place]  ADL's:  Intact  Cognition:  WNL  Sleep:   ok      Assessment and Plan: Bipolar disorder type I.  Anxiety.  I reviewed blood work results.  His hemoglobin A1c is stable.  Mild elevation of creatinine from the past and patient is following up with nephrologist.  Patient and his wife does not want to change the medication since keeping him is stable.  Continue Zyprexa 20 mg at bedtime, Lamictal 200 mg daily and Xanax 0.5 mg 3 times a day.  Discussed medication side effects and benefits.  Recommended to call us back if is any question or any concern.  Follow-up in 6 months.  Follow Up  Instructions:    I discussed the assessment and treatment plan with the patient. The patient was provided an opportunity to ask questions and all were answered. The patient agreed with the plan and demonstrated an understanding of the instructions.   The patient was advised to call back or seek an in-person evaluation if the symptoms worsen or if the condition fails to improve as anticipated.  I provided 15 minutes of non-face-to-face time during this encounter.   [name], MD

## 2020-08-27 ENCOUNTER — Telehealth: Payer: Self-pay | Admitting: Family Medicine

## 2020-08-27 NOTE — Telephone Encounter (Signed)
Left message for patient to call back and schedule the Medicare Annual Wellness Visit (AWV) in office or virtual  Last AWV 05/16/2019  Please schedule at anytime with Pepper Pike.  40 minute appointment  Any questions, please contact me at [phone]

## 2020-09-04 DIAGNOSIS — Z23 Encounter for immunization: Secondary | ICD-10-CM | POA: Diagnosis not present

## 2020-09-23 DIAGNOSIS — I442 Atrioventricular block, complete: Secondary | ICD-10-CM | POA: Diagnosis not present

## 2020-09-23 DIAGNOSIS — I5022 Chronic systolic (congestive) heart failure: Secondary | ICD-10-CM | POA: Diagnosis not present

## 2020-09-23 DIAGNOSIS — I2510 Atherosclerotic heart disease of native coronary artery without angina pectoris: Secondary | ICD-10-CM | POA: Diagnosis not present

## 2020-09-23 DIAGNOSIS — E7800 Pure hypercholesterolemia, unspecified: Secondary | ICD-10-CM | POA: Diagnosis not present

## 2020-10-19 ENCOUNTER — Ambulatory Visit (INDEPENDENT_AMBULATORY_CARE_PROVIDER_SITE_OTHER): Payer: Medicare Other

## 2020-10-19 DIAGNOSIS — Z0000 Encounter for general adult medical examination without abnormal findings: Secondary | ICD-10-CM

## 2020-10-19 DIAGNOSIS — Z1211 Encounter for screening for malignant neoplasm of colon: Secondary | ICD-10-CM

## 2020-10-19 NOTE — Progress Notes (Signed)
Subjective:   Jeffrey Herring is a 70 y.o. male who presents for Medicare Annual/Subsequent preventive examination.  Virtual Visit via Telephone Note  I connected with  [name] on 10/19/20 at  9:20 AM EST by telephone and verified that I am speaking with the correct person using two identifiers.  Location: Patient: home Provider: [place] Persons participating in the virtual visit: patient & wife [place]   I discussed the limitations, risks, security and privacy concerns of performing an evaluation and management service by telephone and the availability of in person appointments. The patient expressed understanding and agreed to proceed.  Interactive audio and video telecommunications were attempted between this nurse and patient, however failed, due to patient having technical difficulties OR patient did not have access to video capability.  We continued and completed visit with audio only.  Some vital signs may be absent or patient reported.   Clemetine Marker, LPN    Review of Systems     Cardiac Risk Factors include: advanced age (>32men, >22 women);dyslipidemia;male gender;hypertension     Objective:    There were no vitals filed for this visit. There is no height or weight on file to calculate BMI.  Advanced Directives 10/19/2020 08/10/2019 05/16/2019 05/15/2014 04/28/2014 04/09/2014  Does Patient Have a Medical Advance Directive? No Yes No Patient does not have advance directive Patient does not have advance directive;Patient would not like information Patient does not have advance directive;Patient would not like information  Type of Advance Directive - [place];Living will - - - -  Does patient want to make changes to medical advance directive? - No - Patient declined - - - -  Would patient like information on creating a medical advance directive? Yes (MAU/Ambulatory/Procedural Areas - Information given) No - Patient declined No -  Patient declined - - -  Pre-existing out of facility DNR order (yellow form or pink MOST form) - - - - No No  Some encounter information is confidential and restricted. Go to Review Flowsheets activity to see all data.    Current Medications (verified) Outpatient Encounter Medications as of 10/19/2020  Medication Sig  . ALPRAZolam (XANAX) 0.5 MG tablet Take 1 tablet (0.5 mg total) by mouth 3 (three) times daily.  . cholecalciferol (VITAMIN D) 1000 units tablet Take 1,000 Units by mouth daily.  . clopidogrel (PLAVIX) 75 MG tablet Take 1 tablet (75 mg total) by mouth every morning.  . hydrALAZINE (APRESOLINE) 10 MG tablet Take 1 tablet by mouth 2 (two) times a day.  . lamoTRIgine (LAMICTAL) 200 MG tablet Take 1 tablet (200 mg total) by mouth daily.  . metoprolol succinate (TOPROL-XL) 25 MG 24 hr tablet Take 0.5 tablets by mouth daily.  Marland Kitchen OLANZapine (ZYPREXA) 20 MG tablet TAKE 1 TABLET BY MOUTH ATBEDTIME.  . rosuvastatin (CRESTOR) 20 MG tablet Take 1 tablet (20 mg total) by mouth daily.  . tamsulosin (FLOMAX) 0.4 MG CAPS capsule Take 1 capsule (0.4 mg total) by mouth every morning.   No facility-administered encounter medications on file as of 10/19/2020.    Allergies (verified) Aspirin, Codeine, Demerol [meperidine], and Lithium   History: Past Medical History:  Diagnosis Date  . Arrhythmia   . Bipolar 1 disorder ([place])   . Depression   . Diabetes insipidus ([place])   . History of DVT (deep vein thrombosis)   . History of kidney stones   . History of pulmonary embolism   . Hyperlipemia   . Lithium toxicity   .  Pacemaker   . Stage III chronic kidney disease [facility])    Past Surgical History:  Procedure Laterality Date  . CARDIAC SURGERY    . HERNIA REPAIR Right 02/05/14  . NOSE SURGERY    . PACEMAKER PLACEMENT    . SPINE SURGERY     Family History  Problem Relation Age of Onset  . Depression Mother    Social History   Socioeconomic History  . Marital status: Married    Spouse  name: Not on file  . Number of children: 2  . Years of education: some college  . Highest education level: 12th grade  Occupational History    Employer: DISABLED  Tobacco Use  . Smoking status: Former Smoker    Years: 40.00    Types: Cigars    Quit date: 02/05/2018    Years since quitting: 2.7  . Smokeless tobacco: Never Used  . Tobacco comment: smoking cessation materials not required  Vaping Use  . Vaping Use: Never used  Substance and Sexual Activity  . Alcohol use: No    Alcohol/week: 0.0 standard drinks  . Drug use: No  . Sexual activity: Never  Other Topics Concern  . 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 Difficulty of Paying Living Expenses: Not hard at all  Food Insecurity: No Food Insecurity  . Worried About Charity fundraiser in the Last Year: Never true  . Ran Out of Food in the Last Year: Never true  Transportation Needs: No Transportation Needs  . Lack of Transportation (Medical): No  . Lack of Transportation (Non-Medical): No  Physical Activity: Inactive  . Days of Exercise per Week: 0 days  . Minutes of Exercise per Session: 0 min  Stress: No Stress Concern Present  . Feeling of Stress : Not at all  Social Connections: Moderately Isolated  . Frequency of Communication with Friends and Family: More than three times a week  . Frequency of Social Gatherings with Friends and Family: Once a week  . Attends Religious Services: Never  . Active Member of Clubs or Organizations: No  . Attends Archivist Meetings: Never  . Marital Status: Married    Tobacco Counseling Counseling given: Not Answered Comment: smoking cessation materials not required   Clinical Intake:  Pre-visit preparation completed:  Yes  Pain : No/denies pain     Nutritional Risks: None Diabetes: No  How often do you need to have someone help you when you read instructions, pamphlets, or other written materials from your doctor or pharmacy?: 1 - Never    Interpreter Needed?: No  Information entered by :: Clemetine Marker LPN   Activities of Daily Living In your present state of health, do you have any difficulty performing the following activities: 10/19/2020 07/06/2020  Hearing? N N  Comment declines hearing aids -  Vision? N N  Difficulty concentrating or making decisions? N N  Walking or climbing stairs? N N  Dressing or bathing? N N  Doing errands, shopping? N N  Preparing Food and eating ? N -  Using the Toilet? N -  In the past six months, have you accidently leaked urine? N -  Do you have  problems with loss of bowel control? N -  Managing your Medications? Y -  Managing your Finances? N -  Housekeeping or managing your Housekeeping? N -  Some recent data might be hidden    Patient Care Team: Steele Sizer, MD as PCP - General (Family Medicine) Georg Ruddle, Ashok Cordia, MD as Referring Physician (Cardiology) [name], MD as Consulting Physician (Psychiatry) Tawni Levy, NP as Nurse Practitioner (Family Medicine) Janyce Llanos, NP as Nurse Practitioner (Cardiology)  Indicate any recent Medical Services you may have received from other than [facility] providers in the past year (date may be approximate).     Assessment:   This is a routine wellness examination for [place].  Hearing/Vision screen  Hearing Screening   125Hz  250Hz  500Hz  1000Hz  2000Hz  3000Hz  4000Hz  6000Hz  8000Hz   Right ear:           Left ear:           Comments: Pt denies hearing difficulty  Vision Screening Comments: Past due for eye exam, declines referral to opthamology  Dietary issues and exercise activities discussed: Current Exercise Habits: The patient does not participate in regular exercise at present, Exercise  limited by: None identified  Goals    . DIET - INCREASE WATER INTAKE     Recommend to drink at least 6-8 8oz glasses of water per day.    . Exercise 3x per week (30 min per time)     Recommend walking three times a week for 20-30 minutes.       Depression Screen PHQ 2/9 Scores 10/19/2020 07/06/2020 11/11/2019 05/16/2019 05/01/2019  PHQ - 2 Score 0 0 0 0 0  PHQ- 9 Score - - 0 - 0  Some encounter information is confidential and restricted. Go to Review Flowsheets activity to see all data.    Fall Risk Fall Risk  10/19/2020 07/06/2020 11/11/2019 05/16/2019 05/01/2019  Falls in the past year? 0 0 0 0 0  Number falls in past yr: 0 0 0 0 0  Injury with Fall? 0 0 0 0 0  Risk for fall due to : No Fall Risks - - - -  Risk for fall due to: Comment - - - - -  Follow up Falls prevention discussed - - Falls prevention discussed -  Some encounter information is confidential and restricted. Go to Review Flowsheets activity to see all data.    FALL RISK PREVENTION PERTAINING TO THE HOME:  Any stairs in or around the home? Yes  If so, are there any without handrails? No  Home free of loose throw rugs in walkways, pet beds, electrical cords, etc? Yes  Adequate lighting in your home to reduce risk of falls? Yes   ASSISTIVE DEVICES UTILIZED TO PREVENT FALLS:  Life alert? No  Use of a cane, walker or w/c? No  Grab bars in the bathroom? Yes  Shower chair or bench in shower? No  Elevated toilet seat or a handicapped toilet? No   TIMED UP AND GO:  Was the test performed? No . Telephonic visit.  Cognitive Function:     6CIT Screen 10/19/2020 05/16/2019  What Year? 0 points 0 points  What month? 0 points 0 points  What time? 0 points 0 points  Count back from 20 0 points 0 points  Months in reverse 0 points 0 points  Repeat phrase 10 points 6 points  Total Score 10 6  Some encounter information is confidential and restricted. Go to Review Flowsheets activity to see all  data.     Immunizations Immunization History  Administered Date(s) Administered  . Fluad Quad(high Dose 65+) 07/06/2020  . Influenza, High Dose Seasonal PF 10/28/2015, 09/20/2016, 08/02/2017, 11/06/2018, 06/05/2019  . Influenza, Seasonal, Injecte, Preservative Fre 07/16/2013, 05/16/2014, 08/02/2017  . Influenza-Unspecified 07/16/2013, 05/16/2014, 10/28/2015, 09/20/2016, 08/02/2017, 11/06/2018  . PFIZER SARS-COV-2 Vaccination 12/12/2019, 01/07/2020, 09/04/2020  . Pneumococcal Conjugate-13 10/28/2015  . Pneumococcal Polysaccharide-23 04/23/2014, 08/06/2019  . Tdap 10/27/2005    TDAP status: Due, Education has been provided regarding the importance of this vaccine. Advised may receive this vaccine at local pharmacy or Health Dept. Aware to provide a copy of the vaccination record if obtained from local pharmacy or Health Dept. Verbalized acceptance and understanding.  Flu Vaccine status: Up to date  Pneumococcal vaccine status: Up to date  Covid-19 vaccine status: Completed vaccines  Qualifies for Shingles Vaccine? Yes   Zostavax completed No   Shingrix Completed?: No.    Education has been provided regarding the importance of this vaccine. Patient has been advised to call insurance company to determine out of pocket expense if they have not yet received this vaccine. Advised may also receive vaccine at local pharmacy or Health Dept. Verbalized acceptance and understanding.  Screening Tests Health Maintenance  Topic Date Due  . TETANUS/TDAP  10/16/2026 (Originally 10/28/2015)  . COLONOSCOPY (Pts 45-38yrs Insurance coverage will need to be confirmed)  12/09/2020  . INFLUENZA VACCINE  Completed  . COVID-19 Vaccine  Completed  . Hepatitis C Screening  Completed  . PNA vac Low Risk Adult  Completed    Health Maintenance  There are no preventive care reminders to display for this patient.  Colorectal cancer screening: Type of screening: Colonoscopy. Completed 12/09/10. Repeat every 10  years. Referral sent to [facility] today.   Lung Cancer Screening: (Low Dose CT Chest recommended if Age 68-80 years, 30 pack-year currently smoking OR have quit w/in 15years.) does qualify. Pt declines screening at this time.  Additional Screening:  Hepatitis C Screening: does qualify; Completed 07/06/20  Vision Screening: Recommended annual ophthalmology exams for early detection of glaucoma and other disorders of the eye. Is the patient up to date with their annual eye exam?  No  Who is the provider or what is the name of the office in which the patient attends annual eye exams? Not established If pt is not established with a provider, would they like to be referred to a provider to establish care? No  pt declined.   Dental Screening: Recommended annual dental exams for proper oral hygiene  Community Resource Referral / Chronic Care Management: CRR required this visit?  No   CCM required this visit?  No      Plan:     I have personally reviewed and noted the following in the patient's chart:   . Medical and social history . Use of alcohol, tobacco or illicit drugs  . Current medications and supplements . Functional ability and status . Nutritional status . Physical activity . Advanced directives . List of other physicians . Hospitalizations, surgeries, and ER visits in previous 12 months . Vitals . Screenings to include cognitive, depression, and falls . Referrals and appointments  In addition, I have reviewed and discussed with patient certain preventive protocols, quality metrics, and best practice recommendations. A written personalized care plan for preventive services as well as general preventive health recommendations were provided to patient.     Clemetine Marker, LPN   3/0/0923   Nurse Notes: none

## 2020-10-19 NOTE — Patient Instructions (Signed)
Jeffrey Herring , Thank you for taking time to come for your Medicare Wellness Visit. I appreciate your ongoing commitment to your health goals. Please review the following plan we discussed and let me know if I can assist you in the future.   Screening recommendations/referrals: Colonoscopy: done 12/09/10. Referral sent to [facility] today for repeat screening colonoscopy. They will contact you for an appointment.  Recommended yearly ophthalmology/optometry visit for glaucoma screening and checkup Recommended yearly dental visit for hygiene and checkup  Vaccinations: Influenza vaccine: done 07/06/20 Pneumococcal vaccine: done 08/06/19 Tdap vaccine: due Shingles vaccine: Shingrix discussed. Please contact your pharmacy for coverage information.  Covid-19: done 12/12/19, 01/07/20 & 09/04/20  Advanced directives: Advance directive discussed with you today. I have provided a copy for you to complete at home and have notarized. Once this is complete please bring a copy in to our office so we can scan it into your chart.  Conditions/risks identified: Recommend increasing physical activity   Next appointment: Follow up in one year for your annual wellness visit.   Preventive Care 71 Years and Older, Male Preventive care refers to lifestyle choices and visits with your health care provider that can promote health and wellness. What does preventive care include?  A yearly physical exam. This is also called an annual well check.  Dental exams once or twice a year.  Routine eye exams. Ask your health care provider how often you should have your eyes checked.  Personal lifestyle choices, including:  Daily care of your teeth and gums.  Regular physical activity.  Eating a healthy diet.  Avoiding tobacco and drug use.  Limiting alcohol use.  Practicing safe sex.  Taking low doses of aspirin every day.  Taking vitamin and mineral supplements as recommended by your health care  provider. What happens during an annual well check? The services and screenings done by your health care provider during your annual well check will depend on your age, overall health, lifestyle risk factors, and family history of disease. Counseling  Your health care provider may ask you questions about your:  Alcohol use.  Tobacco use.  Drug use.  Emotional well-being.  Home and relationship well-being.  Sexual activity.  Eating habits.  History of falls.  Memory and ability to understand (cognition).  Work and work Statistician. Screening  You may have the following tests or measurements:  Height, weight, and BMI.  Blood pressure.  Lipid and cholesterol levels. These may be checked every 5 years, or more frequently if you are over 38 years old.  Skin check.  Lung cancer screening. You may have this screening every year starting at age 76 if you have a 30-pack-year history of smoking and currently smoke or have quit within the past 15 years.  Fecal occult blood test (FOBT) of the stool. You may have this test every year starting at age 53.  Flexible sigmoidoscopy or colonoscopy. You may have a sigmoidoscopy every 5 years or a colonoscopy every 10 years starting at age 57.  Prostate cancer screening. Recommendations will vary depending on your family history and other risks.  Hepatitis C blood test.  Hepatitis B blood test.  Sexually transmitted disease (STD) testing.  Diabetes screening. This is done by checking your blood sugar (glucose) after you have not eaten for a while (fasting). You may have this done every 1-3 years.  Abdominal aortic aneurysm (AAA) screening. You may need this if you are a current or former smoker.  Osteoporosis. You may be  screened starting at age 66 if you are at high risk. Talk with your health care provider about your test results, treatment options, and if necessary, the need for more tests. Vaccines  Your health care provider  may recommend certain vaccines, such as:  Influenza vaccine. This is recommended every year.  Tetanus, diphtheria, and acellular pertussis (Tdap, Td) vaccine. You may need a Td booster every 10 years.  Zoster vaccine. You may need this after age 53.  Pneumococcal 13-valent conjugate (PCV13) vaccine. One dose is recommended after age 18.  Pneumococcal polysaccharide (PPSV23) vaccine. One dose is recommended after age 38. Talk to your health care provider about which screenings and vaccines you need and how often you need them. This information is not intended to replace advice given to you by your health care provider. Make sure you discuss any questions you have with your health care provider. Document Released: 10/29/2015 Document Revised: 06/21/2016 Document Reviewed: 08/03/2015 Elsevier Interactive Patient Education  2017 [place] Prevention in the Home Falls can cause injuries. They can happen to people of all ages. There are many things you can do to make your home safe and to help prevent falls. What can I do on the outside of my home?  Regularly fix the edges of walkways and driveways and fix any cracks.  Remove anything that might make you trip as you walk through a door, such as a raised step or threshold.  Trim any bushes or trees on the path to your home.  Use bright outdoor lighting.  Clear any walking paths of anything that might make someone trip, such as rocks or tools.  Regularly check to see if handrails are loose or broken. Make sure that both sides of any steps have handrails.  Any raised decks and porches should have guardrails on the edges.  Have any leaves, snow, or ice cleared regularly.  Use sand or salt on walking paths during winter.  Clean up any spills in your garage right away. This includes oil or grease spills. What can I do in the bathroom?  Use night lights.  Install grab bars by the toilet and in the tub and shower. Do not use  towel bars as grab bars.  Use non-skid mats or decals in the tub or shower.  If you need to sit down in the shower, use a plastic, non-slip stool.  Keep the floor dry. Clean up any water that spills on the floor as soon as it happens.  Remove soap buildup in the tub or shower regularly.  Attach bath mats securely with double-sided non-slip rug tape.  Do not have throw rugs and other things on the floor that can make you trip. What can I do in the bedroom?  Use night lights.  Make sure that you have a light by your bed that is easy to reach.  Do not use any sheets or blankets that are too big for your bed. They should not hang down onto the floor.  Have a firm chair that has side arms. You can use this for support while you get dressed.  Do not have throw rugs and other things on the floor that can make you trip. What can I do in the kitchen?  Clean up any spills right away.  Avoid walking on wet floors.  Keep items that you use a lot in easy-to-reach places.  If you need to reach something above you, use a strong step stool that has a  grab bar.  Keep electrical cords out of the way.  Do not use floor polish or wax that makes floors slippery. If you must use wax, use non-skid floor wax.  Do not have throw rugs and other things on the floor that can make you trip. What can I do with my stairs?  Do not leave any items on the stairs.  Make sure that there are handrails on both sides of the stairs and use them. Fix handrails that are broken or loose. Make sure that handrails are as long as the stairways.  Check any carpeting to make sure that it is firmly attached to the stairs. Fix any carpet that is loose or worn.  Avoid having throw rugs at the top or bottom of the stairs. If you do have throw rugs, attach them to the floor with carpet tape.  Make sure that you have a light switch at the top of the stairs and the bottom of the stairs. If you do not have them, ask  someone to add them for you. What else can I do to help prevent falls?  Wear shoes that:  Do not have high heels.  Have rubber bottoms.  Are comfortable and fit you well.  Are closed at the toe. Do not wear sandals.  If you use a stepladder:  Make sure that it is fully opened. Do not climb a closed stepladder.  Make sure that both sides of the stepladder are locked into place.  Ask someone to hold it for you, if possible.  Clearly mark and make sure that you can see:  Any grab bars or handrails.  First and last steps.  Where the edge of each step is.  Use tools that help you move around (mobility aids) if they are needed. These include:  Canes.  Walkers.  Scooters.  Crutches.  Turn on the lights when you go into a dark area. Replace any light bulbs as soon as they burn out.  Set up your furniture so you have a clear path. Avoid moving your furniture around.  If any of your floors are uneven, fix them.  If there are any pets around you, be aware of where they are.  Review your medicines with your doctor. Some medicines can make you feel dizzy. This can increase your chance of falling. Ask your doctor what other things that you can do to help prevent falls. This information is not intended to replace advice given to you by your health care provider. Make sure you discuss any questions you have with your health care provider. Document Released: 07/29/2009 Document Revised: 03/09/2016 Document Reviewed: 11/06/2014 Elsevier Interactive Patient Education  2017 [employer].

## 2020-10-27 DIAGNOSIS — I420 Dilated cardiomyopathy: Secondary | ICD-10-CM | POA: Diagnosis not present

## 2020-10-27 DIAGNOSIS — I2510 Atherosclerotic heart disease of native coronary artery without angina pectoris: Secondary | ICD-10-CM | POA: Diagnosis not present

## 2020-10-27 DIAGNOSIS — I442 Atrioventricular block, complete: Secondary | ICD-10-CM | POA: Diagnosis not present

## 2020-10-27 DIAGNOSIS — I10 Essential (primary) hypertension: Secondary | ICD-10-CM | POA: Diagnosis not present

## 2020-10-28 ENCOUNTER — Telehealth (INDEPENDENT_AMBULATORY_CARE_PROVIDER_SITE_OTHER): Payer: Self-pay | Admitting: Gastroenterology

## 2020-10-28 ENCOUNTER — Other Ambulatory Visit: Payer: Self-pay

## 2020-10-28 DIAGNOSIS — Z1211 Encounter for screening for malignant neoplasm of colon: Secondary | ICD-10-CM

## 2020-10-28 MED ORDER — GOLYTELY 236 G PO SOLR: 4000.0000 mL | Freq: Once | ORAL | 0 refills | Status: AC

## 2020-10-28 NOTE — Progress Notes (Signed)
Gastroenterology Pre-Procedure Review  Request Date: Friday 11/26/20 Requesting Physician: Dr. Bonna Gains  PATIENT REVIEW QUESTIONS: The patient responded to the following health history questions as indicated:    1. Are you having any GI issues? no 2. Do you have a personal history of Polyps? no 3. Do you have a family history of Colon Cancer or Polyps? no 4. Diabetes Mellitus? no 5. Joint replacements in the past 12 months?no 6. Major health problems in the past 3 months?no 7. Any artificial heart valves, MVP, or defibrillator?yes (pacemaker cardiologist Dr. Valetta Fuller at [facility] will be requested)    MEDICATIONS & ALLERGIES:    Patient reports the following regarding taking any anticoagulation/antiplatelet therapy:   Plavix, Coumadin, Eliquis, Xarelto, Lovenox, Pradaxa, Brilinta, or Effient? yes (Plavix prescribed by Dr. Prince Rome.  Rx written by Dr. Ancil Boozer.  Blood thinner request will be sent to Dr. Prince Rome also.) Aspirin? no  Patient confirms/reports the following medications:  Current Outpatient Medications  Medication Sig Dispense Refill  . ALPRAZolam (XANAX) 0.5 MG tablet Take 1 tablet (0.5 mg total) by mouth 3 (three) times daily. 90 tablet 5  . Calcium Carb-Ergocalciferol 500-200 MG-UNIT TABS Take by mouth.    . cholecalciferol (VITAMIN D) 1000 units tablet Take 1,000 Units by mouth daily.    . clopidogrel (PLAVIX) 75 MG tablet Take 1 tablet (75 mg total) by mouth every morning. 90 tablet 1  . hydrALAZINE (APRESOLINE) 10 MG tablet Take 1 tablet by mouth 2 (two) times a day.    . lamoTRIgine (LAMICTAL) 200 MG tablet Take 1 tablet (200 mg total) by mouth daily. 90 tablet 1  . metoprolol succinate (TOPROL-XL) 25 MG 24 hr tablet Take 0.5 tablets by mouth daily.    Marland Kitchen OLANZapine (ZYPREXA) 20 MG tablet TAKE 1 TABLET BY MOUTH ATBEDTIME. 90 tablet 1  . rosuvastatin (CRESTOR) 20 MG tablet Take 1 tablet (20 mg total) by mouth daily. 90 tablet 1  . tamsulosin (FLOMAX) 0.4  MG CAPS capsule Take 1 capsule (0.4 mg total) by mouth every morning. 90 capsule 1   No current facility-administered medications for this visit.    Patient confirms/reports the following allergies:  Allergies  Allergen Reactions  . Aspirin Hives, Shortness Of Breath and Palpitations  . Codeine Nausea And Vomiting  . Demerol [Meperidine] Nausea And Vomiting  . Lithium      acute renal failure    No orders of the defined types were placed in this encounter.   AUTHORIZATION INFORMATION Primary Insurance: 1D#: Group #:  Secondary Insurance: 1D#: Group #:  SCHEDULE INFORMATION: Date: 11/26/20 Time: Location:[facility]

## 2020-11-24 ENCOUNTER — Other Ambulatory Visit: Payer: Self-pay

## 2020-11-24 ENCOUNTER — Other Ambulatory Visit
Admission: RE | Admit: 2020-11-24 | Discharge: 2020-11-24 | Disposition: A | Discharge status: Home / Self Care | Payer: Medicare Other | Source: Ambulatory Visit | Attending: Gastroenterology | Admitting: Gastroenterology

## 2020-11-24 DIAGNOSIS — Z20822 Contact with and (suspected) exposure to covid-19: Secondary | ICD-10-CM | POA: Diagnosis not present

## 2020-11-24 DIAGNOSIS — Z01812 Encounter for preprocedural laboratory examination: Secondary | ICD-10-CM | POA: Insufficient documentation

## 2020-11-24 LAB — SARS CORONAVIRUS 2 (TAT 6-24 HRS): SARS Coronavirus 2: NEGATIVE

## 2020-11-26 ENCOUNTER — Encounter: Payer: Self-pay | Admitting: Gastroenterology

## 2020-11-26 ENCOUNTER — Ambulatory Visit: Payer: Medicare Other | Admitting: Anesthesiology

## 2020-11-26 ENCOUNTER — Ambulatory Visit
Admission: RE | Admit: 2020-11-26 | Discharge: 2020-11-26 | Disposition: A | Discharge status: Home / Self Care | Payer: Medicare Other | Attending: Gastroenterology | Admitting: Gastroenterology

## 2020-11-26 ENCOUNTER — Encounter
Admission: RE | Disposition: A | Discharge status: Home / Self Care | Payer: Self-pay | Source: Home / Self Care | Attending: Gastroenterology

## 2020-11-26 DIAGNOSIS — Z87891 Personal history of nicotine dependence: Secondary | ICD-10-CM | POA: Diagnosis not present

## 2020-11-26 DIAGNOSIS — D126 Benign neoplasm of colon, unspecified: Secondary | ICD-10-CM | POA: Diagnosis not present

## 2020-11-26 DIAGNOSIS — Z79899 Other long term (current) drug therapy: Secondary | ICD-10-CM | POA: Insufficient documentation

## 2020-11-26 DIAGNOSIS — Z1211 Encounter for screening for malignant neoplasm of colon: Secondary | ICD-10-CM | POA: Diagnosis not present

## 2020-11-26 DIAGNOSIS — I130 Hypertensive heart and chronic kidney disease with heart failure and stage 1 through stage 4 chronic kidney disease, or unspecified chronic kidney disease: Secondary | ICD-10-CM | POA: Diagnosis not present

## 2020-11-26 DIAGNOSIS — D125 Benign neoplasm of sigmoid colon: Secondary | ICD-10-CM | POA: Insufficient documentation

## 2020-11-26 DIAGNOSIS — N184 Chronic kidney disease, stage 4 (severe): Secondary | ICD-10-CM | POA: Diagnosis not present

## 2020-11-26 DIAGNOSIS — Z7902 Long term (current) use of antithrombotics/antiplatelets: Secondary | ICD-10-CM | POA: Diagnosis not present

## 2020-11-26 DIAGNOSIS — Z885 Allergy status to narcotic agent status: Secondary | ICD-10-CM | POA: Insufficient documentation

## 2020-11-26 DIAGNOSIS — N400 Enlarged prostate without lower urinary tract symptoms: Secondary | ICD-10-CM | POA: Diagnosis not present

## 2020-11-26 DIAGNOSIS — K621 Rectal polyp: Secondary | ICD-10-CM | POA: Diagnosis not present

## 2020-11-26 DIAGNOSIS — E785 Hyperlipidemia, unspecified: Secondary | ICD-10-CM | POA: Diagnosis not present

## 2020-11-26 DIAGNOSIS — Z888 Allergy status to other drugs, medicaments and biological substances status: Secondary | ICD-10-CM | POA: Insufficient documentation

## 2020-11-26 DIAGNOSIS — K6289 Other specified diseases of anus and rectum: Secondary | ICD-10-CM | POA: Diagnosis not present

## 2020-11-26 DIAGNOSIS — Z886 Allergy status to analgesic agent status: Secondary | ICD-10-CM | POA: Diagnosis not present

## 2020-11-26 DIAGNOSIS — K635 Polyp of colon: Secondary | ICD-10-CM | POA: Diagnosis not present

## 2020-11-26 DIAGNOSIS — D122 Benign neoplasm of ascending colon: Secondary | ICD-10-CM | POA: Diagnosis not present

## 2020-11-26 DIAGNOSIS — I5022 Chronic systolic (congestive) heart failure: Secondary | ICD-10-CM | POA: Diagnosis not present

## 2020-11-26 HISTORY — PX: COLONOSCOPY WITH PROPOFOL: SHX5780

## 2020-11-26 SURGERY — COLONOSCOPY WITH PROPOFOL
Anesthesia: General

## 2020-11-26 MED ORDER — SODIUM CHLORIDE 0.9 % IV SOLN
INTRAVENOUS | Status: DC
  Administered 2020-11-26: 1000 mL via INTRAVENOUS

## 2020-11-26 MED ORDER — LIDOCAINE HCL (CARDIAC) PF 100 MG/5ML IV SOSY
PREFILLED_SYRINGE | INTRAVENOUS | Status: DC | PRN
  Administered 2020-11-26: 40 mg via INTRAVENOUS

## 2020-11-26 MED ORDER — PROPOFOL 500 MG/50ML IV EMUL
INTRAVENOUS | Status: DC | PRN
  Administered 2020-11-26: 150 ug/kg/min via INTRAVENOUS

## 2020-11-26 MED ORDER — PROPOFOL 500 MG/50ML IV EMUL
INTRAVENOUS | Status: AC
  Filled 2020-11-26: qty 50

## 2020-11-26 MED ORDER — EPHEDRINE SULFATE 50 MG/ML IJ SOLN
INTRAMUSCULAR | Status: DC | PRN
  Administered 2020-11-26: 10 mg via INTRAVENOUS

## 2020-11-26 MED ORDER — PROPOFOL 10 MG/ML IV BOLUS
INTRAVENOUS | Status: DC | PRN
  Administered 2020-11-26: 10 mg via INTRAVENOUS
  Administered 2020-11-26: 70 mg via INTRAVENOUS

## 2020-11-26 MED ORDER — EPHEDRINE 5 MG/ML INJ
INTRAVENOUS | Status: AC
  Filled 2020-11-26: qty 10

## 2020-11-26 MED ORDER — PHENYLEPHRINE HCL (PRESSORS) 10 MG/ML IV SOLN
INTRAVENOUS | Status: DC | PRN
  Administered 2020-11-26: 100 ug via INTRAVENOUS

## 2020-11-26 NOTE — H&P (Signed)
Jeffrey Antigua, MD [address] [place], [place], [place], [address] [address], [place], [place], [place], [address] Phone: [phone]  Fax: [phone]  Primary Care Physician:  Steele Sizer, MD   Pre-Procedure History & Physical: HPI:  Jeffrey Herring is a 70 y.o. male is here for a colonoscopy.   Past Medical History:  Diagnosis Date  . Arrhythmia   . Bipolar 1 disorder ([place])   . Depression   . Diabetes insipidus ([place])   . History of DVT (deep vein thrombosis)   . History of kidney stones   . History of pulmonary embolism   . Hyperlipemia   . Lithium toxicity   . Pacemaker   . Stage III chronic kidney disease [facility])     Past Surgical History:  Procedure Laterality Date  . BACK SURGERY    . CARDIAC SURGERY    . HERNIA REPAIR Right 02/05/14  . NOSE SURGERY    . PACEMAKER PLACEMENT    . SPINE SURGERY      Prior to Admission medications   Medication Sig Start Date End Date Taking? Authorizing Provider  ALPRAZolam [name]) 0.5 MG tablet Take 1 tablet (0.5 mg total) by mouth 3 (three) times daily. 08/25/20  Yes Arfeen, Arlyce Harman, MD  Calcium Carb-Ergocalciferol 500-200 MG-UNIT TABS Take by mouth.   Yes [provider]  cholecalciferol (VITAMIN D) 1000 units tablet Take 1,000 Units by mouth daily.   Yes [provider]  hydrALAZINE (APRESOLINE) 10 MG tablet Take 1 tablet by mouth 2 (two) times a day. 11/12/18  Yes [provider]  lamoTRIgine (LAMICTAL) 200 MG tablet Take 1 tablet (200 mg total) by mouth daily. 08/25/20  Yes Arfeen, Arlyce Harman, MD  metoprolol succinate (TOPROL-XL) 25 MG 24 hr tablet Take 0.5 tablets by mouth daily. 10/01/20  Yes [provider]  OLANZapine (ZYPREXA) 20 MG tablet TAKE 1 TABLET BY MOUTH ATBEDTIME. 08/25/20  Yes Arfeen, Arlyce Harman, MD  rosuvastatin (CRESTOR) 20 MG tablet Take 1 tablet (20 mg total) by mouth daily. 07/06/20  Yes Sowles, Drue Stager, MD  tamsulosin (FLOMAX) 0.4 MG CAPS capsule Take 1 capsule  (0.4 mg total) by mouth every morning. 07/06/20  Yes Sowles, Drue Stager, MD  clopidogrel (PLAVIX) 75 MG tablet Take 1 tablet (75 mg total) by mouth every morning. 07/06/20   Steele Sizer, MD    Allergies as of 10/28/2020 - Review Complete 10/28/2020  Allergen Reaction Noted  . Aspirin Hives, Shortness Of Breath, and Palpitations 05/15/2014  . Codeine Nausea And Vomiting 01/05/2014  . Demerol [meperidine] Nausea And Vomiting 01/05/2014  . Lithium  05/05/2016    Family History  Problem Relation Age of Onset  . Depression Mother     Social History   Socioeconomic History  . Marital status: Married    Spouse name: Not on file  . Number of children: 2  . Years of education: some college  . Highest education level: 12th grade  Occupational History    Employer: DISABLED  Tobacco Use  . Smoking status: Former Smoker    Years: 40.00    Types: Cigars    Quit date: 02/05/2018    Years since quitting: 2.8  . Smokeless tobacco: Never Used  . Tobacco comment: smoking cessation materials not required  Vaping Use  . Vaping Use: Never used  Substance and Sexual Activity  . Alcohol use: No    Alcohol/week: 0.0 standard drinks  . Drug use: No  . Sexual activity: Never  Other Topics Concern  .  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 Difficulty of Paying Living Expenses: Not hard at all  Food Insecurity: No Food Insecurity  . Worried About Charity fundraiser in the Last Year: Never true  . Ran Out of Food in the Last Year: Never true  Transportation Needs: No Transportation Needs  . Lack of Transportation (Medical): No  . Lack of Transportation (Non-Medical): No  Physical Activity: Inactive  . Days of Exercise per Week: 0 days  . Minutes of  Exercise per Session: 0 min  Stress: No Stress Concern Present  . Feeling of Stress : Not at all  Social Connections: Moderately Isolated  . Frequency of Communication with Friends and Family: More than three times a week  . Frequency of Social Gatherings with Friends and Family: Once a week  . Attends Religious Services: Never  . Active Member of Clubs or Organizations: No  . Attends Archivist Meetings: Never  . Marital Status: Married  Human resources officer Violence: Not At Risk  . Fear of Current or Ex-Partner: No  . Emotionally Abused: No  . Physically Abused: No  . Sexually Abused: No    Review of Systems: See HPI, otherwise negative ROS  Physical Exam: BP (!) 150/88   Pulse 90   Temp 97.6 F (36.4 C) (Temporal)   Resp 18   Ht 5\' 10"  (1.778 m)   Wt 79.5 kg   SpO2 93%   BMI 25.16 kg/m  General:   Alert,  pleasant and cooperative in NAD Head:  Normocephalic and atraumatic. Neck:  Supple; no masses or thyromegaly. Lungs:  Clear throughout to auscultation, normal respiratory effort.    Heart:  +S1, +S2, Regular rate and rhythm, No edema. Abdomen:  Soft, nontender and nondistended. Normal bowel sounds, without guarding, and without rebound.   Neurologic:  Alert and  oriented x4;  grossly normal neurologically.  Impression/Plan: Jeffrey Herring is here for a colonoscopy to be performed for average risk screening.  Risks, benefits, limitations, and alternatives regarding  colonoscopy have been reviewed with the patient.  Questions have been answered.  All parties agreeable.   Jeffrey Manifold, MD  11/26/2020, 11:36 AM

## 2020-11-26 NOTE — Transfer of Care (Signed)
Immediate Anesthesia Transfer of Care Note  Patient: Jeffrey Herring  Procedure(s) Performed: Procedure(s): COLONOSCOPY WITH PROPOFOL (N/A)  Patient Location: PACU and Endoscopy Unit  Anesthesia Type:General  Level of Consciousness: sedated  Airway & Oxygen Therapy: Patient Spontanous Breathing and Patient connected to nasal cannula oxygen  Post-op Assessment: Report given to RN and Post -op Vital signs reviewed and stable  Post vital signs: Reviewed and stable  Last Vitals:  Vitals:   11/26/20 1227 11/26/20 1229  BP: 95/62 116/71  Pulse: 69 67  Resp: (!) 21 (!) 27  Temp:    SpO2: 83% 29%    Complications: No apparent anesthesia complications

## 2020-11-26 NOTE — Anesthesia Procedure Notes (Signed)
Date/Time: 11/26/2020 11:59 AM Performed by: Doreen Salvage, CRNA Pre-anesthesia Checklist: Patient identified, Emergency Drugs available, Suction available and Patient being monitored Patient Re-evaluated:Patient Re-evaluated prior to induction Oxygen Delivery Method: Nasal cannula Induction Type: IV induction Dental Injury: Teeth and Oropharynx as per pre-operative assessment  Comments: Nasal cannula with etCO2 monitoring

## 2020-11-26 NOTE — Anesthesia Preprocedure Evaluation (Addendum)
Anesthesia Evaluation  Patient identified by MRN, date of birth, ID band Patient awake    Reviewed: Allergy & Precautions, NPO status , Patient's Chart, lab work & pertinent test results  History of Anesthesia Complications Negative for: history of anesthetic complications  Airway Mallampati: II  TM Distance: >3 FB Neck ROM: Full    Dental  (+) Edentulous Upper, Partial Lower   Pulmonary neg pulmonary ROS, neg sleep apnea, neg COPD, Patient abstained from smoking.Not current smoker, former smoker,    Pulmonary exam normal breath sounds clear to auscultation       Cardiovascular Exercise Tolerance: Poor METShypertension, + CAD and +CHF  (-) Past MI + dysrhythmias + pacemaker + Valvular Problems/Murmurs  Rhythm:Regular Rate:Normal - Systolic murmurs EF 81%, mod-severe TR   Neuro/Psych PSYCHIATRIC DISORDERS Depression Bipolar Disorder negative neurological ROS     GI/Hepatic neg GERD  ,(+)     (-) substance abuse  ,   Endo/Other  neg diabetes  Renal/GU CRFRenal disease     Musculoskeletal   Abdominal   Peds  Hematology   Anesthesia Other Findings Past Medical History: No date: Arrhythmia No date: Bipolar 1 disorder ([facility]) No date: Depression No date: Diabetes insipidus ([facility]) No date: History of DVT (deep vein thrombosis) No date: History of kidney stones No date: History of pulmonary embolism No date: Hyperlipemia No date: Lithium toxicity No date: Pacemaker No date: Stage III chronic kidney disease ([facility])  Reproductive/Obstetrics                            Anesthesia Physical Anesthesia Plan  ASA: III  Anesthesia Plan: General   Post-op Pain Management:    Induction: Intravenous  PONV Risk Score and Plan: 2 and Ondansetron, Propofol infusion and TIVA  Airway Management Planned: Nasal Cannula  Additional Equipment: None  Intra-op Plan:   Post-operative Plan:   Informed  Consent: I have reviewed the patients History and Physical, chart, labs and discussed the procedure including the risks, benefits and alternatives for the proposed anesthesia with the patient or authorized representative who has indicated his/her understanding and acceptance.     Dental advisory given  Plan Discussed with: CRNA and Surgeon  Anesthesia Plan Comments: (Discussed risks of anesthesia with patient, including possibility of difficulty with spontaneous ventilation under anesthesia necessitating airway intervention, PONV, and rare risks such as cardiac or respiratory or neurological events. Patient understands.)        Anesthesia Quick Evaluation

## 2020-11-26 NOTE — Op Note (Signed)
[facility] Patient Name: Jeffrey Herring Procedure Date: 11/26/2020 11:37 AM MRN: [id number] Account #: [id number] Date of Birth: January 01, 1951 Admit Type: Outpatient Age: 70 Room: [facility] ENDO ROOM 2 Gender: Male Note Status: Finalized Procedure:             Colonoscopy Indications:           Screening for colorectal malignant neoplasm Providers:             Kelis Plasse B. Bonna Gains MD, MD Referring MD:          Bethena Roys. Sowles, MD (Referring MD) Medicines:             Monitored Anesthesia Care Complications:         No immediate complications. Procedure:             Pre-Anesthesia Assessment:                        - ASA Grade Assessment: III - A patient with severe                         systemic disease.                        - Prior to the procedure, a History and Physical was                         performed, and patient medications, allergies and                         sensitivities were reviewed. The patient's tolerance                         of previous anesthesia was reviewed.                        - The risks and benefits of the procedure and the                         sedation options and risks were discussed with the                         patient. All questions were answered and informed                         consent was obtained.                        - Patient identification and proposed procedure were                         verified prior to the procedure by the physician, the                         nurse, the anesthesiologist, the anesthetist and the                         technician. The procedure was verified in the                         procedure  room.                        After obtaining informed consent, the colonoscope was                         passed under direct vision. Throughout the procedure,                         the patient's blood pressure, pulse, and oxygen                         saturations were  monitored continuously. The                         Colonoscope was introduced through the anus and                         advanced to the the cecum, identified by appendiceal                         orifice and ileocecal valve. The colonoscopy was                         performed with ease. The patient tolerated the                         procedure well. The quality of the bowel preparation                         was fair. Findings:      The perianal and digital rectal examinations were normal.      A 3 mm polyp was found in the ascending colon. The polyp was sessile.       The polyp was removed with a cold snare. Resection and retrieval were       complete.      A 4 mm polyp was found in the sigmoid colon. The polyp was sessile. The       polyp was removed with a jumbo cold forceps. Resection and retrieval       were complete.      A patchy area of mildly nodular mucosa was found in the recto-sigmoid       colon. Biopsies were taken with a cold forceps for histology.      The exam was otherwise without abnormality.      The rectum, sigmoid colon, descending colon, transverse colon, ascending       colon and cecum appeared normal.      The retroflexed view of the distal rectum and anal verge was normal and       showed no anal or rectal abnormalities. Impression:            - Preparation of the colon was fair.                        - One 3 mm polyp in the ascending colon, removed with                         a cold snare. Resected and retrieved.                        -  One 4 mm polyp in the sigmoid colon, removed with a                         jumbo cold forceps. Resected and retrieved.                        - Nodular mucosa in the recto-sigmoid colon. Biopsied.                        - The examination was otherwise normal.                        - The rectum, sigmoid colon, descending colon,                         transverse colon, ascending colon and cecum are normal.                         - The distal rectum and anal verge are normal on                         retroflexion view. Recommendation:        - Await pathology results.                        - Discharge patient to home (with escort).                        - Advance diet as tolerated.                        - Continue present medications.                        - Repeat colonoscopy in 6-12 months, with 2 day prep.                        - The findings and recommendations were discussed with                         the patient.                        - The findings and recommendations were discussed with                         the patient's family.                        - Return to primary care physician as previously                         scheduled. Procedure Code(s):     --- Professional ---                        [phone], Colonoscopy, flexible; with removal of                         tumor(s), polyp(s), or other lesion(s) by snare  technique                        45380, 59, Colonoscopy, flexible; with biopsy, single                         or multiple Diagnosis Code(s):     --- Professional ---                        Z12.11, Encounter for screening for malignant neoplasm                         of colon                        K63.5, Polyp of colon CPT copyright 2019 American Medical Association. All rights reserved. The codes documented in this report are preliminary and upon coder review may  be revised to meet current compliance requirements.  [name], MD Margretta Sidle B. Bonna Gains MD, MD 11/26/2020 12:26:11 PM This report has been signed electronically. Number of Addenda: 0 Note Initiated On: 11/26/2020 11:37 AM Scope Withdrawal Time: 0 hours 20 minutes 52 seconds  Total Procedure Duration: 0 hours 24 minutes 31 seconds  Estimated Blood Loss:  Estimated blood loss: none.      [facility]

## 2020-11-26 NOTE — Anesthesia Postprocedure Evaluation (Signed)
Anesthesia Post Note  Patient: Jeffrey Herring  Procedure(s) Performed: COLONOSCOPY WITH PROPOFOL (N/A )  Patient location during evaluation: Endoscopy Anesthesia Type: General Level of consciousness: awake and alert Pain management: pain level controlled Vital Signs Assessment: post-procedure vital signs reviewed and stable Respiratory status: spontaneous breathing, nonlabored ventilation, respiratory function stable and patient connected to nasal cannula oxygen Cardiovascular status: blood pressure returned to baseline and stable Postop Assessment: no apparent nausea or vomiting Anesthetic complications: no   No complications documented.   Last Vitals:  Vitals:   11/26/20 1240 11/26/20 1249  BP:  118/63  Pulse: 64   Resp: 15   Temp:    SpO2: 95%     Last Pain:  Vitals:   11/26/20 1018  TempSrc: Temporal  PainSc: 0-No pain                 Arita Miss

## 2020-11-30 LAB — SURGICAL PATHOLOGY

## 2020-12-01 ENCOUNTER — Encounter: Payer: Self-pay | Admitting: Gastroenterology

## 2020-12-27 ENCOUNTER — Other Ambulatory Visit: Payer: Self-pay | Admitting: Family Medicine

## 2020-12-27 DIAGNOSIS — I2510 Atherosclerotic heart disease of native coronary artery without angina pectoris: Secondary | ICD-10-CM

## 2021-01-03 NOTE — Progress Notes (Signed)
Name: Jeffrey Herring   MRN: [id number]    DOB: [birth year]   Date:01/04/2021       Progress Note  Subjective  Chief Complaint  Follow Up  HPI  CHF: under the care of Dr. Prince Rome at [place]., EF is down to 25-30 % he states he denies orthopnea this time, he always uses two pillows, he denieschest pain or palpitation, he has SOB with minimal exertion, unable to go shopping with his wife because of increase in SOB. lHe has a pacemaker.First one [phone], had some vegetation on pacemaker leads back in 2009 and also sees Dr. Georg Ruddle at [facility] for  pacemaker was replaced again 04/2019He had multiple replacements in between and currently located on abdominal wall because of scar tissue. He hasone vessel CAD and is on Plavix ,, he also takes Crestor ( goal is LDL below 70 )  He is back on low metoprolol and also taking Hydralazine   CKI: Currently under the care of [facility] nephrologist,  last labs were stable, GFR was 24 , continue regular follow up   Bipolar disorder I:still on Lamictal,Zyprexa, alprazolam .No recent manic episodes,Hehas beendisabled since age 71 . He came in with his wife, he states he is feeling well, mood has been leveled out. He still does not have motivation and spends most of the days in bed   BPH: he has urinary frequency, taking Flomax to control symptoms. He has nocturia only once per night, stable. His last PSA was done here in September and it was 9.660, we contacted patient and wife stated he had an urologist, but today she said she contacted. Lucy Menefeer ANP  which is a nephrologist. We will recheck labs and placed referral to Urologist today   HTN: taking medication, no chest pain or palpitation.He has occasional SOB, discussed deconditioning. BP improved with rest, continue current medications.   Hyperlipidemia: he is on Rosuvastatin , denies myalgia, reviewed labs with patient , LDL went up a little, sometimes he skips his medications.   Acute  low back pain: he has intermittent low back pain, states two days ago noticed increase in pain on right lower back, no rashes, not radiating. He spends a lot of time laying in bed. Wife had some old lidoderm patches and seems to have helped. Discussed stretching, consider chiropractor care, shifting positions.   Patient Active Problem List   Diagnosis Date Noted  . Encounter for screening colonoscopy   . Polyp of sigmoid colon   . Nodule of rectum   . Polyp of ascending colon   . Hyperparathyroidism, secondary renal ([place]) 07/06/2020  . Nasal congestion with rhinorrhea 11/06/2018  . Chronic kidney disease, stage 4 (severe) ([place]) 10/24/2017  . Dilated cardiomyopathy ([place]) 02/18/2017  . BPH (benign prostatic hyperplasia) 11/04/2015  . Chronic kidney disease (CKD), stage III (moderate) (HCC) 10/28/2015  . Chronic systolic heart failure ([place]) 10/28/2015  . HLD (hyperlipidemia) 10/28/2015  . H/O deep venous thrombosis 10/28/2015  . History of sepsis 04/09/2014  . Inguinal hernia 01/06/2014  . Fitting or adjustment of cardiac pacemaker 01/05/2014  . Complete atrioventricular block ([place]) 10/14/2013  . Sinoatrial node dysfunction ([place]) 10/14/2013  . Kidney cysts 05/06/2013  . Hypertension, benign 10/01/2012  . Healed or old pulmonary embolism 09/30/2012  . Artificial cardiac pacemaker 09/30/2012  . Bipolar 1 disorder ([place]) 05/01/2012  . Arteriosclerosis of coronary artery 06/16/2009    Past Surgical History:  Procedure Laterality Date  . BACK SURGERY    . CARDIAC SURGERY    .  COLONOSCOPY WITH PROPOFOL N/A 11/26/2020   Procedure: COLONOSCOPY WITH PROPOFOL;  Surgeon: Virgel Manifold, MD;  Location: [facility] ENDOSCOPY;  Service: Endoscopy;  Laterality: N/A;  . HERNIA REPAIR Right 02/05/14  . NOSE SURGERY    . PACEMAKER PLACEMENT    . SPINE SURGERY      Family History  Problem Relation Age of Onset  . Depression Mother     Social History   Tobacco Use  . Smoking status: Former  Smoker    Years: 40.00    Types: Cigars    Quit date: 02/05/2018    Years since quitting: 2.9  . Smokeless tobacco: Never Used  . Tobacco comment: smoking cessation materials not required  Substance Use Topics  . Alcohol use: No    Alcohol/week: 0.0 standard drinks     Current Outpatient Medications:  .  ALPRAZolam (XANAX) 0.5 MG tablet, Take 1 tablet (0.5 mg total) by mouth 3 (three) times daily., Disp: 90 tablet, Rfl: 5 .  cholecalciferol (VITAMIN D) 1000 units tablet, Take 1,000 Units by mouth daily., Disp: , Rfl:  .  clopidogrel (PLAVIX) 75 MG tablet, Take 1 tablet (75 mg total) by mouth every morning., Disp: 90 tablet, Rfl: 1 .  hydrALAZINE (APRESOLINE) 10 MG tablet, Take 1 tablet by mouth 2 (two) times a day., Disp: , Rfl:  .  lamoTRIgine (LAMICTAL) 200 MG tablet, Take 1 tablet (200 mg total) by mouth daily., Disp: 90 tablet, Rfl: 1 .  metoprolol succinate (TOPROL-XL) 25 MG 24 hr tablet, Take 0.5 tablets by mouth daily., Disp: , Rfl:  .  OLANZapine (ZYPREXA) 20 MG tablet, TAKE 1 TABLET BY MOUTH ATBEDTIME., Disp: 90 tablet, Rfl: 1 .  rosuvastatin (CRESTOR) 20 MG tablet, TAKE 1 TABLET (20 MG TOTAL) BY MOUTH DAILY., Disp: 90 tablet, Rfl: 0 .  tamsulosin (FLOMAX) 0.4 MG CAPS capsule, Take 1 capsule (0.4 mg total) by mouth every morning., Disp: 90 capsule, Rfl: 1 .  Calcium Carb-Ergocalciferol 500-200 MG-UNIT TABS, Take by mouth. (Patient not taking: Reported on 01/04/2021), Disp: , Rfl:   Allergies  Allergen Reactions  . Aspirin Hives, Shortness Of Breath and Palpitations  . Codeine Nausea And Vomiting  . Demerol [Meperidine] Nausea And Vomiting  . Lithium      acute renal failure    I personally reviewed active problem list, medication list, allergies, family history, social history, health maintenance with the patient/caregiver today.   ROS  Constitutional: Negative for fever or weight change.  Respiratory: Negative for cough , but has  shortness of breath with activity .    Cardiovascular: Negative for chest pain or palpitations.  Gastrointestinal: Negative for abdominal pain, no bowel changes.  Musculoskeletal: Negative for gait problem or joint swelling.  Skin: Negative for rash.  Neurological: Negative for dizziness or headache.  No other specific complaints in a complete review of systems (except as listed in HPI above).  Objective  Vitals:   01/04/21 1108 01/04/21 1110  BP: (!) 150/90 138/78  Pulse: 97   Resp: 16   Temp: 98.1 F (36.7 C)   TempSrc: Oral   SpO2: 95%   Weight: 179 lb (81.2 kg)   Height: 5\' 10"  (1.778 m)     Body mass index is 25.68 kg/m.  Physical Exam  Constitutional: Patient appears well-developed and well-nourished. Overweight.  No distress.  HEENT: head atraumatic, normocephalic, pupils equal and reactive to light,  neck supple Cardiovascular: Normal rate, regular rhythm and normal heart sounds.  No murmur heard. No BLE  edema. Pulmonary/Chest: Effort normal and breath sounds normal. No respiratory distress. Abdominal: Soft.  There is no tenderness Muscular Skeletal: negative straight leg raise, mild tenderness with palpation of right lumbar spine  Psychiatric: Patient has a normal mood and affect. behavior is normal. Judgment and thought content normal.  Recent Results (from the past 2160 hour(s))  SARS CORONAVIRUS 2 (TAT 6-24 HRS) Nasopharyngeal Nasopharyngeal Swab     Status: None   Collection Time: 11/24/20 11:53 AM   Specimen: Nasopharyngeal Swab  Result Value Ref Range   SARS Coronavirus 2 NEGATIVE NEGATIVE    Comment: (NOTE) SARS-CoV-2 target nucleic acids are NOT DETECTED.  The SARS-CoV-2 RNA is generally detectable in upper and lower respiratory specimens during the acute phase of infection. Negative results do not preclude SARS-CoV-2 infection, do not rule out co-infections with other pathogens, and should not be used as the sole basis for treatment or other patient management decisions. Negative  results must be combined with clinical observations, patient history, and epidemiological information. The expected result is Negative.  Fact Sheet for Patients: SugarRoll.be  Fact Sheet for Healthcare Providers: https://www.woods-mathews.com/  This test is not yet approved or cleared by the [place] FDA and  has been authorized for detection and/or diagnosis of SARS-CoV-2 by FDA under an Emergency Use Authorization (EUA). This EUA will remain  in effect (meaning this test can be used) for the duration of the COVID-19 declaration under Se ction 564(b)(1) of the Act, 21 U.S.C. section 360bbb-3(b)(1), unless the authorization is terminated or revoked sooner.  Performed at [facility], [place] [address], [place], [place] [address]   Surgical pathology     Status: None   Collection Time: 11/26/20 12:03 PM  Result Value Ref Range   SURGICAL PATHOLOGY      SURGICAL PATHOLOGY CASE: [id number] PATIENT: [facility] Surgical Pathology Report     Specimen Submitted: A. Colon polyp x 2, ascending and sigmoid; cold snare (1) and cold biopsy (1) B. Rectosigmoid nodular mucosa; cbx  Clinical History: Screening colonoscopy.  Findings: polyps     DIAGNOSIS: A.  COLON POLYP X 2, ASCENDING AND SIGMOID; COLD SNARE (1) AND COLD BIOPSY (1): - TUBULAR ADENOMA, NEGATIVE FOR HIGH-GRADE DYSPLASIA AND MALIGNANCY. - HYPERPLASTIC POLYP, TWO 1 MM FRAGMENTS, NEGATIVE FOR DYSPLASIA AND MALIGNANCY.  B.  RECTOSIGMOID NODULAR MUCOSA; COLD BIOPSY: - HYPERPLASTIC POLYP, ONE FRAGMENT. - ONE MUCOSAL FRAGMENT WITH NONSPECIFIC CRYPT HYPERPLASIA AND A LYMPHOID AGGREGATE. - NEGATIVE FOR DYSPLASIA AND MALIGNANCY.  GROSS DESCRIPTION: A. Labeled: Cold snare/CBX colon polyp x2 Received: In formalin Collection time: 12:03 PM on 11/26/2020 Placed into formalin time: 12:03 PM on 11/26/2020 Tissue fragment(s): Multiple Size: Aggregate, 1.5 x  0.8 x  0.1 Description: Received are multiple fragments of pink-tan and yellow-tan possible soft tissue mixed with intestinal debris.  Ratio of soft tissue to intestinal debris is 30/70. Entirely submitted in cassette 1.  B. Labeled: CBX rectosigmoid nodular mucosa versus hyperplastic polyp Received: In formalin Collection time: 12:20 PM on 11/26/2020 Placed into formalin time: 12:20 PM on 11/26/2020 Tissue fragment(s): 2 Size: Up to 0.5 and 0.5 cm Description: 2 fragments of pink-tan soft tissue. Entirely submitted in cassette 1.  Final Diagnosis performed by Bryan Lemma, MD.   Electronically signed 11/30/2020 6:46:42PM The electronic signature indicates that the named Attending Pathologist has evaluated the specimen Technical component performed at [facility], [address] [place], [place] [address] Lab: [id number] Dir: Rush Farmer, MD, MMM  Professional component performed at [facility], [facility], [phone]  [place], [place], [place] [address] Lab:  [phone] Dir: Dellia Nims. Rubinas, MD       PHQ2/9: Depression screen [facility] 2/9 01/04/2021 10/19/2020 07/06/2020 11/11/2019 05/16/2019  Decreased Interest 0 0 0 0 0  Down, Depressed, Hopeless 0 0 0 0 0  PHQ - 2 Score 0 0 0 0 0  Altered sleeping - - - 0 -  Tired, decreased energy - - - 0 -  Change in appetite - - - 0 -  Feeling bad or failure about yourself  - - - 0 -  Trouble concentrating - - - 0 -  Moving slowly or fidgety/restless - - - 0 -  Suicidal thoughts - - - 0 -  PHQ-9 Score - - - 0 -  Some encounter information is confidential and restricted. Go to Review Flowsheets activity to see all data.    phq 9 is negative   Fall Risk: Fall Risk  01/04/2021 10/19/2020 07/06/2020 11/11/2019 05/16/2019  Falls in the past year? 0 0 0 0 0  Number falls in past yr: 0 0 0 0 0  Injury with Fall? 0 0 0 0 0  Risk for fall due to : - No Fall Risks - - -  Risk for fall due to: Comment - - - - -  Follow up - Falls prevention  discussed - - Falls prevention discussed  Some encounter information is confidential and restricted. Go to Review Flowsheets activity to see all data.     Functional Status Survey: Is the patient deaf or have difficulty hearing?: No Does the patient have difficulty seeing, even when wearing glasses/contacts?: No Does the patient have difficulty concentrating, remembering, or making decisions?: No Does the patient have difficulty walking or climbing stairs?: No Does the patient have difficulty dressing or bathing?: No Does the patient have difficulty doing errands alone such as visiting a doctor's office or shopping?: No    Assessment & Plan  1. Acute right-sided low back pain without sciatica  Wife will keep using lidoderm  2. Bipolar 1 disorder ([place])  Keep follow up with psychiatrist   3. Benign prostatic hyperplasia with urinary obstruction  - tamsulosin (FLOMAX) 0.4 MG CAPS capsule; Take 1 capsule (0.4 mg total) by mouth every morning.  Dispense: 90 capsule; Refill: 1  4. Essential hypertension   5. ASCVD (arteriosclerotic cardiovascular disease)  - clopidogrel (PLAVIX) 75 MG tablet; Take 1 tablet (75 mg total) by mouth every morning.  Dispense: 90 tablet; Refill: 1 - rosuvastatin (CRESTOR) 20 MG tablet; Take 1 tablet (20 mg total) by mouth daily.  Dispense: 90 tablet; Refill: 0  6. Mixed hyperlipidemia   7. Chronic systolic heart failure (HCC)  Stable   8. Sinoatrial node dysfunction (HCC)  - clopidogrel (PLAVIX) 75 MG tablet; Take 1 tablet (75 mg total) by mouth every morning.  Dispense: 90 tablet; Refill: 1  9. Chronic kidney disease, stage 4 (severe) (HCC)  Keep follow up with nephrologist   10. Elevated PSA measurement  - Ambulatory referral to Urology - PSA

## 2021-01-04 ENCOUNTER — Encounter: Payer: Self-pay | Admitting: Family Medicine

## 2021-01-04 ENCOUNTER — Other Ambulatory Visit: Payer: Self-pay

## 2021-01-04 ENCOUNTER — Ambulatory Visit (INDEPENDENT_AMBULATORY_CARE_PROVIDER_SITE_OTHER): Payer: Medicare Other | Admitting: Family Medicine

## 2021-01-04 VITALS — BP 138/78 | HR 97 | Temp 98.1°F | Resp 16 | Ht 70.0 in | Wt 179.0 lb

## 2021-01-04 DIAGNOSIS — M545 Low back pain, unspecified: Secondary | ICD-10-CM | POA: Diagnosis not present

## 2021-01-04 DIAGNOSIS — I2510 Atherosclerotic heart disease of native coronary artery without angina pectoris: Secondary | ICD-10-CM | POA: Diagnosis not present

## 2021-01-04 DIAGNOSIS — I495 Sick sinus syndrome: Secondary | ICD-10-CM

## 2021-01-04 DIAGNOSIS — F319 Bipolar disorder, unspecified: Secondary | ICD-10-CM | POA: Diagnosis not present

## 2021-01-04 DIAGNOSIS — R972 Elevated prostate specific antigen [PSA]: Secondary | ICD-10-CM

## 2021-01-04 DIAGNOSIS — N184 Chronic kidney disease, stage 4 (severe): Secondary | ICD-10-CM

## 2021-01-04 DIAGNOSIS — E782 Mixed hyperlipidemia: Secondary | ICD-10-CM

## 2021-01-04 DIAGNOSIS — I10 Essential (primary) hypertension: Secondary | ICD-10-CM | POA: Diagnosis not present

## 2021-01-04 DIAGNOSIS — I5022 Chronic systolic (congestive) heart failure: Secondary | ICD-10-CM | POA: Diagnosis not present

## 2021-01-04 DIAGNOSIS — N138 Other obstructive and reflux uropathy: Secondary | ICD-10-CM | POA: Diagnosis not present

## 2021-01-04 DIAGNOSIS — N401 Enlarged prostate with lower urinary tract symptoms: Secondary | ICD-10-CM

## 2021-01-04 MED ORDER — ROSUVASTATIN CALCIUM 20 MG PO TABS: 20.0000 mg | ORAL_TABLET | Freq: Every day | ORAL | 0 refills | Status: DC

## 2021-01-04 MED ORDER — TAMSULOSIN HCL 0.4 MG PO CAPS: 0.4000 mg | ORAL_CAPSULE | Freq: Every morning | ORAL | 1 refills | Status: DC

## 2021-01-04 MED ORDER — CLOPIDOGREL BISULFATE 75 MG PO TABS: 75.0000 mg | ORAL_TABLET | Freq: Every morning | ORAL | 1 refills | Status: DC

## 2021-01-05 LAB — PSA: PSA: 7.7 ng/mL — ABNORMAL HIGH (ref <=4.0)

## 2021-01-05 NOTE — Progress Notes (Signed)
Let pt know

## 2021-01-12 ENCOUNTER — Encounter: Payer: Self-pay | Admitting: Urology

## 2021-01-12 ENCOUNTER — Ambulatory Visit (INDEPENDENT_AMBULATORY_CARE_PROVIDER_SITE_OTHER): Payer: Medicare Other | Admitting: Urology

## 2021-01-12 ENCOUNTER — Other Ambulatory Visit: Payer: Self-pay

## 2021-01-12 VITALS — BP 145/87 | HR 83 | Ht 70.0 in | Wt 179.0 lb

## 2021-01-12 DIAGNOSIS — R972 Elevated prostate specific antigen [PSA]: Secondary | ICD-10-CM

## 2021-01-12 LAB — BLADDER SCAN AMB NON-IMAGING

## 2021-01-12 NOTE — Progress Notes (Signed)
01/12/21 1:35 PM   Jeffrey Herring [birth year] [id number]  CC: Elevated PSA  HPI: I saw [name] today for evaluation of elevated PSA.  He is a comorbid 70 year old male with bipolar disorder, depression, diabetes, history of DVT/PE, CKD (eGFR 25) and pacemaker who remains on anticoagulation.  He was referred for an elevated PSA of 7.7 in March 2022.  It was actually checked initially in September 2021 and was slightly higher at 9.66.  There are no prior PSA values to compare to.  He denies any family history of prostate cancer.  He is on Flomax for mild urinary symptoms with good results and IPSS score today is 1, with quality of life mostly satisfied.  He has never undergone prostate biopsy previously.  No recent cross-sectional imaging to review  Most of the history is obtained from his daughter who is with him today.  PMH: Past Medical History:  Diagnosis Date  . Arrhythmia   . Bipolar 1 disorder ([place])   . Depression   . Diabetes insipidus ([place])   . History of DVT (deep vein thrombosis)   . History of kidney stones   . History of pulmonary embolism   . Hyperlipemia   . Lithium toxicity   . Pacemaker   . Stage III chronic kidney disease [facility])     Surgical History: Past Surgical History:  Procedure Laterality Date  . BACK SURGERY    . CARDIAC SURGERY    . COLONOSCOPY WITH PROPOFOL N/A 11/26/2020   Procedure: COLONOSCOPY WITH PROPOFOL;  Surgeon: Virgel Manifold, MD;  Location: [facility] ENDOSCOPY;  Service: Endoscopy;  Laterality: N/A;  . HERNIA REPAIR Right 02/05/14  . NOSE SURGERY    . PACEMAKER PLACEMENT    . SPINE SURGERY      Home Medications:  Family History: Family History  Problem Relation Age of Onset  . Depression Mother     Social History:  reports that he quit smoking about 2 years ago. His smoking use included cigars. He quit after 40.00 years of use. He has never used smokeless tobacco. He reports that he does not drink alcohol and does not use  drugs.  Physical Exam: BP (!) 145/87   Pulse 83   Ht '5\' 10"'  (1.778 m)   Wt 179 lb (81.2 kg)   BMI 25.68 kg/m    Constitutional:  Alert and oriented, No acute distress.  Flat affect Cardiovascular: No clubbing, cyanosis, or edema. Respiratory: Normal respiratory effort, no increased work of breathing. GI: Abdomen is soft, nontender, nondistended, no abdominal masses DRE: 60 g, smooth, prominent sulcus but no nodules or masses   Laboratory Data: Reviewed, see HPI  Pertinent Imaging: None to review  Assessment & Plan:   Comorbid 70 year old male with 2 elevated PSAs of 9.6 and 7.7, benign DRE.    We reviewed the implications of an elevated PSA and the uncertainty surrounding it.  We reviewed the AUA guidelines that do not recommend routine screening in men over age 31, especially those with numerous comorbidities.  In general, a man's PSA increases with age and is produced by both normal and cancerous prostate tissue. The differential diagnosis for elevated PSA includes BPH, prostate cancer, infection, recent intercourse/ejaculation, recent urethroscopic manipulation (foley placement/cystoscopy) or trauma, and prostatitis.   Management of an elevated PSA can include observation or prostate biopsy and we discussed this in detail. Our goal is to detect clinically significant prostate cancers, and manage with either active surveillance, surgery, or radiation for localized disease.  Risks of prostate biopsy include bleeding, infection (including life threatening sepsis), pain, and lower urinary symptoms. Hematuria, hematospermia, and blood in the stool are all common after biopsy and can persist up to 4 weeks.   We discussed options including a repeat PSA with reflex to free in 3 months, prostate MRI, or prostate biopsy.  With his comorbidities he would like to pursue a repeat PSA in 3 months which I think is reasonable.  We discussed the low, but nonzero, risk of missing a clinically  significant malignancy by deferring further evaluation with MRI or biopsy.  Nickolas Madrid, MD 01/12/2021  [facility] [address], [place], [place] [address] [phone]

## 2021-01-12 NOTE — Patient Instructions (Signed)
Prostate Cancer Screening  Prostate cancer screening is a test that is done to check for the presence of prostate cancer in men. The prostate gland is a walnut-sized gland that is located below the bladder and in front of the rectum in males. The function of the prostate is to add fluid to semen during ejaculation. Prostate cancer is the second most common type of cancer in men. Who should have prostate cancer screening?  Screening recommendations vary based on age and other risk factors. Screening is recommended if:  You are older than age 55. If you are age 55-69, talk with your health care provider about your need for screening and how often screening should be done. Because most prostate cancers are slow growing and will not cause death, screening is generally reserved in this age group for men who have a 10-15-year life expectancy.  You are younger than age 55, and you have these risk factors: ? Being a black male or a male of African descent. ? Having a father, brother, or uncle who has been diagnosed with prostate cancer. The risk is higher if your family member's cancer occurred at an early age. Screening is not recommended if:  You are younger than age 40.  You are between the ages of 40 and 54 and you have no risk factors.  You are 71 years of age or older. At this age, the risks that screening can cause are greater than the benefits that it may provide. If you are at high risk for prostate cancer, your health care provider may recommend that you have screenings more often or that you start screening at a younger age. How is screening for prostate cancer done? The recommended prostate cancer screening test is a blood test called the prostate-specific antigen (PSA) test. PSA is a protein that is made in the prostate. As you age, your prostate naturally produces more PSA. Abnormally high PSA levels may be caused by:  Prostate cancer.  An enlarged prostate that is not caused by cancer  (benign prostatic hyperplasia, BPH). This condition is very common in older men.  A prostate gland infection (prostatitis). Depending on the PSA results, you may need more tests, such as:  A physical exam to check the size of your prostate gland.  Blood and imaging tests.  A procedure to remove tissue samples from your prostate gland for testing (biopsy). What are the benefits of prostate cancer screening?  Screening can help to identify cancer at an early stage, before symptoms start and when the cancer can be treated more easily.  There is a small chance that screening may lower your risk of dying from prostate cancer. The chance is small because prostate cancer is a slow-growing cancer, and most men with prostate cancer die from a different cause. What are the risks of prostate cancer screening? The main risk of prostate cancer screening is diagnosing and treating prostate cancer that would never have caused any symptoms or problems. This is called overdiagnosisand overtreatment. PSA screening cannot tell you if your PSA is high due to cancer or a different cause. A prostate biopsy is the only procedure to diagnose prostate cancer. Even the results of a biopsy may not tell you if your cancer needs to be treated. Slow-growing prostate cancer may not need any treatment other than monitoring, so diagnosing and treating it may cause unnecessary stress or other side effects. A prostate biopsy may also cause:  Infection or fever.  A false negative. This is   a result that shows that you do not have prostate cancer when you actually do have prostate cancer. Questions to ask your health care provider  When should I start prostate cancer screening?  What is my risk for prostate cancer?  How often do I need screening?  What type of screening tests do I need?  How do I get my test results?  What do my results mean?  Do I need treatment? Where to find more information  The American Cancer  Society: www.cancer.org  American Urological Association: www.auanet.org Contact a health care provider if:  You have difficulty urinating.  You have pain when you urinate or ejaculate.  You have blood in your urine or semen.  You have pain in your back or in the area of your prostate. Summary  Prostate cancer is a common type of cancer in men. The prostate gland is located below the bladder and in front of the rectum. This gland adds fluid to semen during ejaculation.  Prostate cancer screening may identify cancer at an early stage, when the cancer can be treated more easily.  The prostate-specific antigen (PSA) test is the recommended screening test for prostate cancer.  Discuss the risks and benefits of prostate cancer screening with your health care provider. If you are age 71 or older, the risks that screening can cause are greater than the benefits that it may provide. This information is not intended to replace advice given to you by your health care provider. Make sure you discuss any questions you have with your health care provider. Document Revised: 01/23/2020 Document Reviewed: 05/15/2019 Elsevier Patient Education  [place].

## 2021-02-01 DIAGNOSIS — I442 Atrioventricular block, complete: Secondary | ICD-10-CM | POA: Diagnosis not present

## 2021-02-15 ENCOUNTER — Telehealth (HOSPITAL_COMMUNITY): Payer: Medicare Other | Admitting: Psychiatry

## 2021-02-16 ENCOUNTER — Telehealth (INDEPENDENT_AMBULATORY_CARE_PROVIDER_SITE_OTHER): Payer: Medicare Other | Admitting: Psychiatry

## 2021-02-16 ENCOUNTER — Other Ambulatory Visit: Payer: Self-pay

## 2021-02-16 ENCOUNTER — Encounter (HOSPITAL_COMMUNITY): Payer: Self-pay | Admitting: Psychiatry

## 2021-02-16 DIAGNOSIS — F411 Generalized anxiety disorder: Secondary | ICD-10-CM | POA: Diagnosis not present

## 2021-02-16 DIAGNOSIS — F319 Bipolar disorder, unspecified: Secondary | ICD-10-CM | POA: Diagnosis not present

## 2021-02-16 MED ORDER — LAMOTRIGINE 200 MG PO TABS: 200.0000 mg | ORAL_TABLET | Freq: Every day | ORAL | 1 refills | Status: DC

## 2021-02-16 MED ORDER — OLANZAPINE 20 MG PO TABS: ORAL_TABLET | ORAL | 1 refills | Status: DC

## 2021-02-16 MED ORDER — ALPRAZOLAM 0.5 MG PO TABS: 0.5000 mg | ORAL_TABLET | Freq: Three times a day (TID) | ORAL | 5 refills | Status: DC

## 2021-02-16 NOTE — Progress Notes (Signed)
See MD note.

## 2021-02-16 NOTE — Patient Instructions (Signed)
Virtual Visit via Telephone Note  I connected with Jeffrey Herring on @TODAY @ at  2:20 PM EDT by telephone and verified that I am speaking with the correct person using two identifiers.  Location: Patient: Home Provider: Home Office   I discussed the limitations, risks, security and privacy concerns of performing an evaluation and management service by telephone and the availability of in person appointments. I also discussed with the patient that there may be a patient responsible charge related to this service. The patient expressed understanding and agreed to proceed.   History of Present Illness: Patient is evaluated by phone session.  He is taking his medication as prescribed.  He denies any mania, psychosis or any hallucination.  He feels the current medicine is working and sometimes he has to wake up in the middle of the night to go to the bathroom.  He denies any crying spells, impulsive behavior, agitation or any anger.  His wife also endorsed that his mood has been stable.  Recently he had a colonoscopy and visit to urology for high PSA.  Patient told no new medication added.  He does not want to change the medication since they are working.  His appetite is okay.  His weight is stable.   Past Psychiatric History:Reviewed. H/Obipolar disorder. H/O inpatientmania and psychosis. Good response with lithium until had a lithium toxicity. Took Abilify, Risperdal, Prozac, Lexapro, Wellbutrin and Navane.   Psychiatric Specialty Exam:     Weight 179 lb (81.2 kg).Body mass index is 25.68 kg/m.  General Appearance: NA  Eye Contact:  NA  Speech:  Slow  Volume:  Decreased  Mood:  Euthymic  Affect:  NA  Thought Process:  Descriptions of Associations: Intact  Orientation:  Full (Time, Place, and Person)  Thought Content:  Logical  Suicidal Thoughts:  No  Homicidal Thoughts:  No  Memory:  Immediate;   Fair Recent;   Fair Remote;   Fair  Judgement:  Fair  Insight:  Present   Psychomotor Activity:  NA  Concentration:  Concentration: Fair and Attention Span: Fair  Recall:  [employer] of Knowledge:  Fair  Language:  Fair  Akathisia:  No  Handed:  Right  AIMS (if indicated):     Assets:  Communication Skills Desire for Improvement Housing Social Support  ADL's:  Intact  Cognition:  Impaired,  Mild  Sleep:   ok      Assessment and Plan: Bipolar disorder type I.  Generalized anxiety disorder.  Patient is a stable on his current medication.  I reviewed blood work results.  Continue Xanax 0.5 mg 3 times a day, olanzapine 15 mg at bedtime and Lamictal 200 mg daily.  Patient has no rash, itching, tremors or shakes.  Recommended to call us back if there is any question or any concern.  Follow-up in 3 months.  Follow Up Instructions:    I discussed the assessment and treatment plan with the patient. The patient was provided an opportunity to ask questions and all were answered. The patient agreed with the plan and demonstrated an understanding of the instructions.   The patient was advised to call back or seek an in-person evaluation if the symptoms worsen or if the condition fails to improve as anticipated.  I provided 18 minutes of non-face-to-face time during this encounter.   [name], MD

## 2021-04-14 ENCOUNTER — Other Ambulatory Visit: Payer: Medicare Other

## 2021-04-14 ENCOUNTER — Other Ambulatory Visit: Payer: Self-pay

## 2021-04-14 DIAGNOSIS — R972 Elevated prostate specific antigen [PSA]: Secondary | ICD-10-CM | POA: Diagnosis not present

## 2021-04-15 LAB — PSA TOTAL (REFLEX TO FREE): Prostate Specific Ag, Serum: 12.1 ng/mL — ABNORMAL HIGH (ref 0.0–4.0)

## 2021-04-19 ENCOUNTER — Telehealth: Payer: Self-pay

## 2021-04-19 NOTE — Telephone Encounter (Signed)
Pt returned call, appt scheduled for 7/20 @ 11

## 2021-04-19 NOTE — Telephone Encounter (Signed)
-----   Message from Billey Co, MD sent at 04/19/2021  8:59 AM EDT ----- PSA remains elevated at 12, please schedule follow-up to discuss options moving forward including MRI versus biopsy.  His daughter accompanied him to his last appointment and is very helpful  Nickolas Madrid, MD 04/19/2021

## 2021-04-19 NOTE — Telephone Encounter (Signed)
Called pt, no answer. LM per DPR requesting pt or wife call back to schedule result appointment. 1st attempt.

## 2021-05-04 ENCOUNTER — Ambulatory Visit (INDEPENDENT_AMBULATORY_CARE_PROVIDER_SITE_OTHER): Payer: Medicare Other | Admitting: Urology

## 2021-05-04 ENCOUNTER — Encounter: Payer: Self-pay | Admitting: Urology

## 2021-05-04 ENCOUNTER — Other Ambulatory Visit: Payer: Self-pay

## 2021-05-04 VITALS — BP 135/82 | HR 82 | Ht 70.0 in | Wt 175.0 lb

## 2021-05-04 DIAGNOSIS — R972 Elevated prostate specific antigen [PSA]: Secondary | ICD-10-CM | POA: Diagnosis not present

## 2021-05-04 NOTE — Progress Notes (Signed)
   05/04/2021 11:31 AM   [name] 01/24/1951 [id number]  Reason for visit: Follow up elevated PSA  HPI: I saw Mr. Adamczak back in urology clinic for elevated PSA.  Briefly, comorbid 70 year old male with bipolar disorder, depression, diabetes, history of DVT/PE, CKD with EGFR of 25, and pacemaker who remains on anticoagulation.  He was referred for an elevated PSA.  This was originally checked in September 2021 and was elevated at 9.66, then 7.7 on repeat in March 2022.  DRE was benign.  We previously discussed options including biopsy, prostate MRI, or 4K score, and using shared decision making they opted for repeat PSA in 3 months.  Repeat PSA in June 2022 remains elevated at 12.1.  We again discussed options moving forward including prostate biopsy, 4K score, or prostate MRI.  Risk, benefits, and alternatives discussed at length.  They would like to start with prostate MRI.  He does have a pacemaker, and we will need to confirm this is MRI compatible.  Prostate MRI, call with results If unable to obtain prostate MRI, will order 4K score  Billey Co, MD  [facility] [address], [place], [place] [address] [phone]

## 2021-05-12 ENCOUNTER — Encounter (HOSPITAL_COMMUNITY): Payer: Self-pay | Admitting: Psychiatry

## 2021-05-12 ENCOUNTER — Telehealth (INDEPENDENT_AMBULATORY_CARE_PROVIDER_SITE_OTHER): Payer: Medicare Other | Admitting: Psychiatry

## 2021-05-12 ENCOUNTER — Other Ambulatory Visit: Payer: Self-pay

## 2021-05-12 DIAGNOSIS — F319 Bipolar disorder, unspecified: Secondary | ICD-10-CM

## 2021-05-12 DIAGNOSIS — F411 Generalized anxiety disorder: Secondary | ICD-10-CM | POA: Diagnosis not present

## 2021-05-12 MED ORDER — ALPRAZOLAM 0.5 MG PO TABS: 0.5000 mg | ORAL_TABLET | Freq: Three times a day (TID) | ORAL | 5 refills | Status: DC

## 2021-05-12 MED ORDER — OLANZAPINE 20 MG PO TABS: ORAL_TABLET | ORAL | 1 refills | Status: DC

## 2021-05-12 MED ORDER — LAMOTRIGINE 200 MG PO TABS: 200.0000 mg | ORAL_TABLET | Freq: Every day | ORAL | 1 refills | Status: DC

## 2021-05-12 NOTE — Progress Notes (Signed)
Virtual Visit via Telephone Note  I connected with Jeffrey Herring on 05/12/21 at  3:20 PM EDT by telephone and verified that I am speaking with the correct person using two identifiers.  Location: Patient: Home Provider: Home Office   I discussed the limitations, risks, security and privacy concerns of performing an evaluation and management service by telephone and the availability of in person appointments. I also discussed with the patient that there may be a patient responsible charge related to this service. The patient expressed understanding and agreed to proceed.   History of Present Illness: Patient is evaluated by phone session.  His wife helped him as sometimes patient tends to forget.  His wife reminds him to take the medicine and he has been compliant with all his medication.  He denies any mania, psychosis, hallucination or any agitation.  He is happy as recently seeing his grandkids.  He sleeps good.  Recently he had a visit with his urologist.  He denies any panic attack, crying spells or any feeling of hopelessness or worthlessness.  His appetite is okay.  His weight is stable.  Denies any impulsive behavior.  His wife endorsed that his mood has been is stable and patient like to keep his current medication.   Past Psychiatric History: Reviewed. H/O bipolar disorder. H/O inpatient mania and psychosis.  Good response with lithium until had a lithium toxicity.  Took Abilify, Risperdal, Prozac, Lexapro, Wellbutrin and Navane.  Psychiatric Specialty Exam: Physical Exam  Review of Systems  Weight 175 lb (79.4 kg).There is no height or weight on file to calculate BMI.  General Appearance: NA  Eye Contact:  NA  Speech:  Slow  Volume:  Normal  Mood:  Euthymic  Affect:  NA  Thought Process:  Descriptions of Associations: Intact  Orientation:  Full (Time, Place, and Person)  Thought Content:  Logical  Suicidal Thoughts:  No  Homicidal Thoughts:  No  Memory:  Immediate;    Fair Recent;   Fair Remote;   Fair  Judgement:  Fair  Insight:  Present  Psychomotor Activity:  NA  Concentration:  Concentration: Fair and Attention Span: Fair  Recall:  [employer] of Knowledge:  Fair  Language:  Fair  Akathisia:  No  Handed:  Right  AIMS (if indicated):     Assets:  Communication Skills Desire for Improvement Housing Social Support  ADL's:  Intact  Cognition:  Impaired,  Mild  Sleep:   ok     Assessment and Plan: Bipolar disorder type I.  Generalized anxiety disorder.  Patient is stable on his current psychotropic medication.  He does not want to change the dose.  Continue olanzapine 20 mg at bedtime, Lamictal 200 mg daily and Xanax 0.5 mg 3 times a day.  Recommended to call us back if there is any question or any concern.  Follow-up in 6 months.  Follow Up Instructions:    I discussed the assessment and treatment plan with the patient. The patient was provided an opportunity to ask questions and all were answered. The patient agreed with the plan and demonstrated an understanding of the instructions.   The patient was advised to call back or seek an in-person evaluation if the symptoms worsen or if the condition fails to improve as anticipated.  I provided 16 minutes of non-face-to-face time during this encounter.   [name], MD

## 2021-05-17 ENCOUNTER — Encounter (HOSPITAL_COMMUNITY): Payer: Self-pay

## 2021-05-17 NOTE — Progress Notes (Signed)
I was asked to look up this pt implant information. He has unipololar, myocardial, screw-in lead model 5071 that is not MR Safe.

## 2021-06-03 DIAGNOSIS — I10 Essential (primary) hypertension: Secondary | ICD-10-CM | POA: Diagnosis not present

## 2021-06-03 DIAGNOSIS — I420 Dilated cardiomyopathy: Secondary | ICD-10-CM | POA: Diagnosis not present

## 2021-06-03 DIAGNOSIS — I2510 Atherosclerotic heart disease of native coronary artery without angina pectoris: Secondary | ICD-10-CM | POA: Diagnosis not present

## 2021-06-03 DIAGNOSIS — I442 Atrioventricular block, complete: Secondary | ICD-10-CM | POA: Diagnosis not present

## 2021-06-07 DIAGNOSIS — I442 Atrioventricular block, complete: Secondary | ICD-10-CM | POA: Diagnosis not present

## 2021-06-22 ENCOUNTER — Other Ambulatory Visit: Payer: Self-pay | Admitting: Family Medicine

## 2021-06-22 DIAGNOSIS — I2510 Atherosclerotic heart disease of native coronary artery without angina pectoris: Secondary | ICD-10-CM

## 2021-07-04 DIAGNOSIS — Z23 Encounter for immunization: Secondary | ICD-10-CM | POA: Diagnosis not present

## 2021-07-08 ENCOUNTER — Encounter: Payer: Self-pay | Admitting: Family Medicine

## 2021-07-08 ENCOUNTER — Other Ambulatory Visit: Payer: Self-pay

## 2021-07-08 ENCOUNTER — Ambulatory Visit (INDEPENDENT_AMBULATORY_CARE_PROVIDER_SITE_OTHER): Payer: Medicare Other | Admitting: Family Medicine

## 2021-07-08 VITALS — BP 128/82 | HR 88 | Temp 98.0°F | Resp 20 | Ht 70.0 in | Wt 184.5 lb

## 2021-07-08 DIAGNOSIS — F319 Bipolar disorder, unspecified: Secondary | ICD-10-CM | POA: Diagnosis not present

## 2021-07-08 DIAGNOSIS — I442 Atrioventricular block, complete: Secondary | ICD-10-CM

## 2021-07-08 DIAGNOSIS — I495 Sick sinus syndrome: Secondary | ICD-10-CM

## 2021-07-08 DIAGNOSIS — I2510 Atherosclerotic heart disease of native coronary artery without angina pectoris: Secondary | ICD-10-CM | POA: Diagnosis not present

## 2021-07-08 DIAGNOSIS — E7800 Pure hypercholesterolemia, unspecified: Secondary | ICD-10-CM

## 2021-07-08 DIAGNOSIS — R972 Elevated prostate specific antigen [PSA]: Secondary | ICD-10-CM

## 2021-07-08 DIAGNOSIS — I5022 Chronic systolic (congestive) heart failure: Secondary | ICD-10-CM

## 2021-07-08 DIAGNOSIS — N401 Enlarged prostate with lower urinary tract symptoms: Secondary | ICD-10-CM | POA: Diagnosis not present

## 2021-07-08 DIAGNOSIS — E559 Vitamin D deficiency, unspecified: Secondary | ICD-10-CM | POA: Diagnosis not present

## 2021-07-08 DIAGNOSIS — R739 Hyperglycemia, unspecified: Secondary | ICD-10-CM | POA: Diagnosis not present

## 2021-07-08 DIAGNOSIS — E782 Mixed hyperlipidemia: Secondary | ICD-10-CM | POA: Diagnosis not present

## 2021-07-08 DIAGNOSIS — I420 Dilated cardiomyopathy: Secondary | ICD-10-CM

## 2021-07-08 DIAGNOSIS — N2581 Secondary hyperparathyroidism of renal origin: Secondary | ICD-10-CM | POA: Diagnosis not present

## 2021-07-08 DIAGNOSIS — N184 Chronic kidney disease, stage 4 (severe): Secondary | ICD-10-CM

## 2021-07-08 DIAGNOSIS — I10 Essential (primary) hypertension: Secondary | ICD-10-CM

## 2021-07-08 DIAGNOSIS — N138 Other obstructive and reflux uropathy: Secondary | ICD-10-CM

## 2021-07-08 MED ORDER — CLOPIDOGREL BISULFATE 75 MG PO TABS: 75.0000 mg | ORAL_TABLET | Freq: Every morning | ORAL | 1 refills | Status: DC

## 2021-07-08 MED ORDER — TAMSULOSIN HCL 0.4 MG PO CAPS
0.4000 mg | ORAL_CAPSULE | Freq: Every morning | ORAL | 1 refills | Status: DC
Start: 2021-07-08 — End: 2022-02-13

## 2021-07-08 NOTE — Progress Notes (Signed)
Name: Jeffrey Herring   MRN: [id number]    DOB: 1950-11-30   Date:07/08/2021       Progress Note  Subjective  Chief Complaint  Follow Up  HPI  CHF: under the care of Dr. Prince Rome at [facility]., EF is down to 25-30 % he states he denies orthopnea this time,  he always uses two pillows, he denies chest pain or palpitation, he has SOB with minimal exertionHe has a pacemaker.  First one in 1983, had some vegetation on pacemaker leads back in 2009 and also sees Dr. Georg Ruddle at [facility] for  pacemaker was replaced again 01/2018 He had multiple replacements in between and currently located on abdominal wall because of scar tissue. He has  one vessel  CAD and is on Plavix ,, he also takes Crestor ( goal is LDL below 70 )  He is back on low metoprolol and also taking Hydralazine , bp is at goal. We will recheck lipid panel today    CKI:  Currently under the care of [facility] nephrologist,  last labs were stable, GFR was 24 , visit have been virtual due to pandemic, we will check labs today. Sometimes has some pruritis, he has good urine output    Bipolar disorder I: still on Lamictal, Zyprexa, alprazolam . No recent manic episodes, He has been  disabled since age 84 . He came in with his wife, he states he is feeling well, mood has been leveled out. He still has lack of motivation, but wife states he has been stable for a long time.   BPH: he has urinary frequency, taking Flomax to control symptoms. He has nocturia only once per night, stable. He was seen by Dr. Diamantina Providence, and was supposed a CT done but never got the appointment, we will recheck PSA and advised to contact him back.    HTN: taking medication, no chest pain or palpitation. He has occasional SOB, discussed deconditioning. Stable, BP today is at goal    Hyperlipidemia: he is on Rosuvastatin , denies myalgia, reviewed labs with patient , LDL went up a little, we will recheck labs   Rebound Rhinorrhea: wife states he continues to use Afrin daily,  discussed rebound rhinorrhea, try switching to flonase  Patient Active Problem List   Diagnosis Date Noted   Encounter for screening colonoscopy    Polyp of sigmoid colon    Nodule of rectum    Polyp of ascending colon    Hyperparathyroidism, secondary renal ([place]) 07/06/2020   Nasal congestion with rhinorrhea 11/06/2018   Chronic kidney disease, stage 4 (severe) ([place]) 10/24/2017   Dilated cardiomyopathy ([place]) 02/18/2017   BPH (benign prostatic hyperplasia) 11/04/2015   Chronic kidney disease (CKD), stage III (moderate) ([place]) 88/28/0034   Chronic systolic heart failure ([place]) 10/28/2015   HLD (hyperlipidemia) 10/28/2015   H/O deep venous thrombosis 10/28/2015   History of sepsis 04/09/2014   Inguinal hernia 01/06/2014   Fitting or adjustment of cardiac pacemaker 01/05/2014   Complete atrioventricular block ([place]) 10/14/2013   Sinoatrial node dysfunction ([place]) 10/14/2013   Kidney cysts 05/06/2013   Hypertension, benign 10/01/2012   Healed or old pulmonary embolism 09/30/2012   Artificial cardiac pacemaker 09/30/2012   Bipolar 1 disorder ([place]) 05/01/2012   Arteriosclerosis of coronary artery 06/16/2009    Past Surgical History:  Procedure Laterality Date   BACK SURGERY     CARDIAC SURGERY     COLONOSCOPY WITH PROPOFOL N/A 11/26/2020   Procedure: COLONOSCOPY WITH PROPOFOL;  Surgeon: [name]  B, MD;  Location: [facility] ENDOSCOPY;  Service: Endoscopy;  Laterality: N/A;   HERNIA REPAIR Right 02/05/14   NOSE SURGERY     PACEMAKER PLACEMENT     SPINE SURGERY      Family History  Problem Relation Age of Onset   Depression Mother     Social History   Tobacco Use   Smoking status: Former    Types: Cigars    Quit date: 02/05/2018    Years since quitting: 3.4   Smokeless tobacco: Never   Tobacco comments:    smoking cessation materials not required  Substance Use Topics   Alcohol use: No    Alcohol/week: 0.0 standard drinks     Current Outpatient Medications:     ALPRAZolam (XANAX) 0.5 MG tablet, Take 1 tablet (0.5 mg total) by mouth 3 (three) times daily., Disp: 90 tablet, Rfl: 5   cholecalciferol (VITAMIN D) 1000 units tablet, Take 1,000 Units by mouth daily., Disp: , Rfl:    clopidogrel (PLAVIX) 75 MG tablet, Take 1 tablet (75 mg total) by mouth every morning., Disp: 90 tablet, Rfl: 1   hydrALAZINE (APRESOLINE) 10 MG tablet, Take 1 tablet by mouth 2 (two) times a day., Disp: , Rfl:    lamoTRIgine (LAMICTAL) 200 MG tablet, Take 1 tablet (200 mg total) by mouth daily., Disp: 90 tablet, Rfl: 1   metoprolol succinate (TOPROL-XL) 25 MG 24 hr tablet, Take 0.5 tablets by mouth daily., Disp: , Rfl:    OLANZapine (ZYPREXA) 20 MG tablet, TAKE 1 TABLET BY MOUTH ATBEDTIME., Disp: 90 tablet, Rfl: 1   rosuvastatin (CRESTOR) 20 MG tablet, TAKE 1 TABLET (20 MG TOTAL) BY MOUTH DAILY., Disp: 90 tablet, Rfl: 0   tamsulosin (FLOMAX) 0.4 MG CAPS capsule, Take 1 capsule (0.4 mg total) by mouth every morning., Disp: 90 capsule, Rfl: 1  Allergies  Allergen Reactions   Aspirin Hives, Shortness Of Breath and Palpitations   Codeine Nausea And Vomiting   Demerol [Meperidine] Nausea And Vomiting   Lithium      acute renal failure    I personally reviewed active problem list, medication list, allergies, family history, social history, health maintenance with the patient/caregiver today.   ROS  Constitutional: Negative for fever, positive for  weight change.  Respiratory: Negative for cough and constant  shortness of breath.   Cardiovascular: Negative for chest pain or palpitations.  Gastrointestinal: Negative for abdominal pain, no bowel changes.  Musculoskeletal: Negative for gait problem or joint swelling.  Skin: Negative for rash.  Neurological: Negative for dizziness or headache.  No other specific complaints in a complete review of systems (except as listed in HPI above).   Objective  Vitals:   07/08/21 1129  BP: 128/82  Pulse: 88  Resp: 20  Temp: 98 F  (36.7 C)  TempSrc: Oral  SpO2: 92%  Weight: 184 lb 8 oz (83.7 kg)  Height: 5\' 10"  (1.778 m)    Body mass index is 26.47 kg/m.  Physical Exam  Constitutional: Patient appears well-developed and well-nourished.  No distress.  HEENT: head atraumatic, normocephalic, pupils equal and reactive to light,neck supple Cardiovascular: Normal rate, regular rhythm and normal heart sounds.  No murmur heard. No BLE edema. Pulmonary/Chest: Effort normal and breath sounds normal. No respiratory distress. Abdominal: Soft.  There is no tenderness. Psychiatric: Patient has a normal mood and affect. behavior is normal. Judgment and thought content normal.   Recent Results (from the past 2160 hour(s))  PSA Total (Reflex To Free)  Status: Abnormal   Collection Time: 04/14/21  1:19 PM  Result Value Ref Range   Prostate Specific Ag, Serum 12.1 (H) 0.0 - 4.0 ng/mL    Comment: Roche ECLIA methodology. According to the American Urological Association, Serum PSA should decrease and remain at undetectable levels after radical prostatectomy. The AUA defines biochemical recurrence as an initial PSA value 0.2 ng/mL or greater followed by a subsequent confirmatory PSA value 0.2 ng/mL or greater. Values obtained with different assay methods or kits cannot be used interchangeably. Results cannot be interpreted as absolute evidence of the presence or absence of malignant disease.    Reflex Criteria Comment     Comment: The percent free PSA is performed on a reflex basis only when the total PSA is between 4.0 and 10.0 ng/mL.       PHQ2/9: Depression screen [facility] 2/9 07/08/2021 01/04/2021 10/19/2020 07/06/2020 11/11/2019  Decreased Interest 0 0 0 0 0  Down, Depressed, Hopeless 0 0 0 0 0  PHQ - 2 Score 0 0 0 0 0  Altered sleeping 1 - - - 0  Tired, decreased energy 1 - - - 0  Change in appetite 0 - - - 0  Feeling bad or failure about yourself  0 - - - 0  Trouble concentrating 0 - - - 0  Moving slowly or  fidgety/restless 0 - - - 0  Suicidal thoughts 0 - - - 0  PHQ-9 Score 2 - - - 0  Difficult doing work/chores Not difficult at all - - - -  Some encounter information is confidential and restricted. Go to Review Flowsheets activity to see all data.    phq 9 is negative   Fall Risk: Fall Risk  07/08/2021 01/04/2021 10/19/2020 07/06/2020 11/11/2019  Falls in the past year? 0 0 0 0 0  Number falls in past yr: - 0 0 0 0  Injury with Fall? - 0 0 0 0  Risk for fall due to : - - No Fall Risks - -  Risk for fall due to: Comment - - - - -  Follow up Falls prevention discussed - Falls prevention discussed - -  Some encounter information is confidential and restricted. Go to Review Flowsheets activity to see all data.    Assessment & Plan  1. Essential hypertension  - COMPLETE METABOLIC PANEL WITH GFR  2. Bipolar 1 disorder ([place])  Under the care of psychiatrist   3. ASCVD (arteriosclerotic cardiovascular disease)  - clopidogrel (PLAVIX) 75 MG tablet; Take 1 tablet (75 mg total) by mouth every morning.  Dispense: 90 tablet; Refill: 1  4. Chronic systolic heart failure (HCC)  - Lipid panel  5. Elevated PSA measurement  - PSA  6. Hyperglycemia  - Hemoglobin A1c  7. Chronic kidney disease, stage 4 (severe) (HCC)  - Microalbumin / creatinine urine ratio - CBC with Differential/Platelet - COMPLETE METABOLIC PANEL WITH GFR - Parathyroid hormone, intact (no Ca)  8. Sinoatrial node dysfunction (HCC)  - clopidogrel (PLAVIX) 75 MG tablet; Take 1 tablet (75 mg total) by mouth every morning.  Dispense: 90 tablet; Refill: 1  9. Benign prostatic hyperplasia with urinary obstruction  - tamsulosin (FLOMAX) 0.4 MG CAPS capsule; Take 1 capsule (0.4 mg total) by mouth every morning.  Dispense: 90 capsule; Refill: 1  10. Mixed hyperlipidemia  - Lipid panel  11. Vitamin D deficiency  - VITAMIN D 25 Hydroxy (Vit-D Deficiency, Fractures)  12. Hyperparathyroidism, secondary renal  ([place])   13. Dilated cardiomyopathy ([place])  14. Complete atrioventricular block ([place])

## 2021-07-09 LAB — CBC WITH DIFFERENTIAL/PLATELET
Absolute Monocytes: 680 cells/uL (ref 200–950)
Basophils Absolute: 48 cells/uL (ref 0–200)
Basophils Relative: 0.6 %
Eosinophils Absolute: 112 cells/uL (ref 15–500)
Eosinophils Relative: 1.4 %
HCT: 43.4 % (ref 38.5–50.0)
Hemoglobin: 14.3 g/dL (ref 13.2–17.1)
Lymphs Abs: 720 cells/uL — ABNORMAL LOW (ref 850–3900)
MCH: 30.1 pg (ref 27.0–33.0)
MCHC: 32.9 g/dL (ref 32.0–36.0)
MCV: 91.4 fL (ref 80.0–100.0)
MPV: 11 fL (ref 7.5–12.5)
Monocytes Relative: 8.5 %
Neutro Abs: 6440 cells/uL (ref 1500–7800)
Neutrophils Relative %: 80.5 %
Platelets: 163 10*3/uL (ref 140–400)
RBC: 4.75 10*6/uL (ref 4.20–5.80)
RDW: 13.3 % (ref 11.0–15.0)
Total Lymphocyte: 9 %
WBC: 8 10*3/uL (ref 3.8–10.8)

## 2021-07-09 LAB — COMPLETE METABOLIC PANEL WITH GFR
AG Ratio: 1.6 (calc) (ref 1.0–2.5)
ALT: 16 U/L (ref 9–46)
AST: 16 U/L (ref 10–35)
Albumin: 4.6 g/dL (ref 3.6–5.1)
Alkaline phosphatase (APISO): 77 U/L (ref 35–144)
BUN/Creatinine Ratio: 13 (calc) (ref 6–22)
BUN: 34 mg/dL — ABNORMAL HIGH (ref 7–25)
CO2: 18 mmol/L — ABNORMAL LOW (ref 20–32)
Calcium: 10.4 mg/dL — ABNORMAL HIGH (ref 8.6–10.3)
Chloride: 108 mmol/L (ref 98–110)
Creat: 2.54 mg/dL — ABNORMAL HIGH (ref 0.70–1.28)
Globulin: 2.8 g/dL (calc) (ref 1.9–3.7)
Glucose, Bld: 98 mg/dL (ref 65–99)
Potassium: 4 mmol/L (ref 3.5–5.3)
Sodium: 141 mmol/L (ref 135–146)
Total Bilirubin: 0.5 mg/dL (ref 0.2–1.2)
Total Protein: 7.4 g/dL (ref 6.1–8.1)
eGFR: 26 mL/min/{1.73_m2} — ABNORMAL LOW (ref >=60)

## 2021-07-09 LAB — LIPID PANEL
Cholesterol: 153 mg/dL (ref <200)
HDL: 45 mg/dL (ref >=40)
LDL Cholesterol (Calc): 85 mg/dL (calc)
Non-HDL Cholesterol (Calc): 108 mg/dL (calc) (ref <130)
Total CHOL/HDL Ratio: 3.4 (calc) (ref <5.0)
Triglycerides: 133 mg/dL (ref <150)

## 2021-07-09 LAB — VITAMIN D 25 HYDROXY (VIT D DEFICIENCY, FRACTURES): Vit D, 25-Hydroxy: 49 ng/mL (ref 30–100)

## 2021-07-09 LAB — MICROALBUMIN / CREATININE URINE RATIO
Creatinine, Urine: 94 mg/dL (ref 20–320)
Microalb Creat Ratio: 65 mcg/mg creat — ABNORMAL HIGH (ref <30)
Microalb, Ur: 6.1 mg/dL

## 2021-07-09 LAB — HEMOGLOBIN A1C
Hgb A1c MFr Bld: 5.4 % of total Hgb (ref <5.7)
Mean Plasma Glucose: 108 mg/dL
eAG (mmol/L): 6 mmol/L

## 2021-07-09 LAB — PSA: PSA: 9.81 ng/mL — ABNORMAL HIGH (ref <=4.00)

## 2021-08-29 ENCOUNTER — Other Ambulatory Visit (HOSPITAL_COMMUNITY): Payer: Self-pay | Admitting: Psychiatry

## 2021-08-29 DIAGNOSIS — F319 Bipolar disorder, unspecified: Secondary | ICD-10-CM

## 2021-08-29 DIAGNOSIS — F411 Generalized anxiety disorder: Secondary | ICD-10-CM

## 2021-09-12 ENCOUNTER — Other Ambulatory Visit (HOSPITAL_COMMUNITY): Payer: Self-pay | Admitting: Psychiatry

## 2021-09-12 ENCOUNTER — Other Ambulatory Visit: Payer: Self-pay | Admitting: Family Medicine

## 2021-09-12 DIAGNOSIS — F319 Bipolar disorder, unspecified: Secondary | ICD-10-CM

## 2021-09-12 DIAGNOSIS — I2510 Atherosclerotic heart disease of native coronary artery without angina pectoris: Secondary | ICD-10-CM

## 2021-09-13 ENCOUNTER — Telehealth (HOSPITAL_COMMUNITY): Payer: Self-pay | Admitting: *Deleted

## 2021-09-13 NOTE — Telephone Encounter (Signed)
Thank you for calling.

## 2021-09-13 NOTE — Telephone Encounter (Signed)
Pt's wife MayAnn called requesting refill of the Xanax 0.5 mg as Rx has expired per [employer]. Pt has an appointment upcoming on 11/10/21. Ok to bridge?

## 2021-09-13 NOTE — Telephone Encounter (Signed)
Called twice. Second phone call PhD stated that pt does have one fill left. Initially said that previous script had expired and would require new one. So he's good. Thanks.

## 2021-09-13 NOTE — Telephone Encounter (Signed)
It was written on July 28 with 5 additional refills.  Should not be expired.  Can you call pharmacy to check?

## 2021-09-20 DIAGNOSIS — I442 Atrioventricular block, complete: Secondary | ICD-10-CM | POA: Diagnosis not present

## 2021-10-20 ENCOUNTER — Ambulatory Visit: Payer: Medicare Other

## 2021-10-27 DIAGNOSIS — E7800 Pure hypercholesterolemia, unspecified: Secondary | ICD-10-CM | POA: Diagnosis not present

## 2021-10-27 DIAGNOSIS — I10 Essential (primary) hypertension: Secondary | ICD-10-CM | POA: Diagnosis not present

## 2021-10-27 DIAGNOSIS — I5022 Chronic systolic (congestive) heart failure: Secondary | ICD-10-CM | POA: Diagnosis not present

## 2021-10-27 DIAGNOSIS — Z950 Presence of cardiac pacemaker: Secondary | ICD-10-CM | POA: Diagnosis not present

## 2021-10-27 DIAGNOSIS — I420 Dilated cardiomyopathy: Secondary | ICD-10-CM | POA: Diagnosis not present

## 2021-11-01 ENCOUNTER — Ambulatory Visit (INDEPENDENT_AMBULATORY_CARE_PROVIDER_SITE_OTHER): Payer: Medicare Other

## 2021-11-01 DIAGNOSIS — Z0000 Encounter for general adult medical examination without abnormal findings: Secondary | ICD-10-CM

## 2021-11-01 NOTE — Progress Notes (Signed)
Subjective:   Jeffrey Herring is a 71 y.o. male who presents for Medicare Annual/Subsequent preventive examination.  Virtual Visit via Telephone Note  I connected with  Jeffrey Herring on 11/01/21 at  2:50 PM EST by telephone and verified that I am speaking with the correct person using two identifiers.  Location: Patient: home Provider: [place] Persons participating in the virtual visit: [place]   I discussed the limitations, risks, security and privacy concerns of performing an evaluation and management service by telephone and the availability of in person appointments. The patient expressed understanding and agreed to proceed.  Interactive audio and video telecommunications were attempted between this nurse and patient, however failed, due to patient having technical difficulties OR patient did not have access to video capability.  We continued and completed visit with audio only.  Some vital signs may be absent or patient reported.   Clemetine Marker, LPN   Review of Systems     Cardiac Risk Factors include: advanced age (>4men, >31 women);dyslipidemia;male gender;hypertension     Objective:    There were no vitals filed for this visit. There is no height or weight on file to calculate BMI.  Advanced Directives 11/01/2021 11/26/2020 10/19/2020 08/10/2019 05/16/2019 05/15/2014 04/28/2014  Does Patient Have a Medical Advance Directive? No No No Yes No Patient does not have advance directive Patient does not have advance directive;Patient would not like information  Type of Advance Directive - - - [place];Living will - - -  Does patient want to make changes to medical advance directive? - - - No - Patient declined - - -  Would patient like information on creating a medical advance directive? No - Patient declined - Yes (MAU/Ambulatory/Procedural Areas - Information given) No - Patient declined No - Patient declined - -  Pre-existing out of  facility DNR order (yellow form or pink MOST form) - - - - - - No  Some encounter information is confidential and restricted. Go to Review Flowsheets activity to see all data.    Current Medications (verified) Outpatient Encounter Medications as of 11/01/2021  Medication Sig   ALPRAZolam (XANAX) 0.5 MG tablet Take 1 tablet (0.5 mg total) by mouth 3 (three) times daily.   cholecalciferol (VITAMIN D) 1000 units tablet Take 1,000 Units by mouth daily.   clopidogrel (PLAVIX) 75 MG tablet Take 1 tablet (75 mg total) by mouth every morning.   hydrALAZINE (APRESOLINE) 10 MG tablet Take 1 tablet by mouth 2 (two) times a day.   lamoTRIgine (LAMICTAL) 200 MG tablet Take 1 tablet (200 mg total) by mouth daily.   metoprolol succinate (TOPROL-XL) 25 MG 24 hr tablet Take 0.5 tablets by mouth daily.   OLANZapine (ZYPREXA) 20 MG tablet TAKE 1 TABLET BY MOUTH ATBEDTIME.   rosuvastatin (CRESTOR) 20 MG tablet TAKE 1 TABLET BY MOUTH DAILY   tamsulosin (FLOMAX) 0.4 MG CAPS capsule Take 1 capsule (0.4 mg total) by mouth every morning.   No facility-administered encounter medications on file as of 11/01/2021.    Allergies (verified) Aspirin, Codeine, Demerol [meperidine], and Lithium   History: Past Medical History:  Diagnosis Date   Arrhythmia    Bipolar 1 disorder ([place])    Depression    Diabetes insipidus ([place])    History of DVT (deep vein thrombosis)    History of kidney stones    History of pulmonary embolism    Hyperlipemia    Lithium toxicity    Pacemaker    Stage  III chronic kidney disease [facility])    Past Surgical History:  Procedure Laterality Date   BACK SURGERY     CARDIAC SURGERY     COLONOSCOPY WITH PROPOFOL N/A 11/26/2020   Procedure: COLONOSCOPY WITH PROPOFOL;  Surgeon: Virgel Manifold, MD;  Location: [facility] ENDOSCOPY;  Service: Endoscopy;  Laterality: N/A;   HERNIA REPAIR Right 02/05/14   NOSE SURGERY     PACEMAKER PLACEMENT     SPINE SURGERY     Family History  Problem  Relation Age of Onset   Depression Mother    Social History   Socioeconomic History   Marital status: Married    Spouse name: Not on file   Number of children: 2   Years of education: some college   Highest education level: 12th grade  Occupational History    Employer: DISABLED  Tobacco Use   Smoking status: Former    Types: Cigars    Quit date: 02/05/2018    Years since quitting: 3.7   Smokeless tobacco: Never   Tobacco comments:    smoking cessation materials not required  Vaping Use   Vaping Use: Never used  Substance and Sexual Activity   Alcohol use: No    Alcohol/week: 0.0 standard drinks   Drug use: No   Sexual activity: Never  Other Topics Concern   Not on file  Social History Narrative   Used to be a  IT consultant, 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77   Lives with wife   They have two grown children       Social Determinants of Health   Financial Resource Strain: Low Risk    Difficulty of Paying Living Expenses: Not hard at all  Food Insecurity: No Food Insecurity   Worried About Charity fundraiser in the Last Year: Never true   Arboriculturist in the Last Year: Never true  Transportation Needs: No Transportation Needs   Lack of Transportation (Medical): No   Lack of Transportation (Non-Medical): No  Physical Activity: Inactive   Days of Exercise per Week: 0 days   Minutes of Exercise per Session: 0 min  Stress: No Stress Concern Present   Feeling of Stress : Not at all  Social Connections: Moderately Isolated   Frequency of Communication with Friends and Family: More than three times a week   Frequency of Social Gatherings with Friends and Family: Once a week   Attends Religious Services: Never   Marine scientist or Organizations: No   Attends Music therapist: Never   Marital Status: Married    Tobacco Counseling Counseling given: Not Answered Tobacco comments: smoking cessation  materials not required   Clinical Intake:  Pre-visit preparation completed: Yes  Pain : No/denies pain     Nutritional Risks: None Diabetes: No  How often do you need to have someone help you when you read instructions, pamphlets, or other written materials from your doctor or pharmacy?: 1 - Never    Interpreter Needed?: No  Information entered by :: Clemetine Marker LPN   Activities of Daily Living In your present state of health, do you have any difficulty performing the following activities: 11/01/2021 07/08/2021  Hearing? N N  Vision? N N  Difficulty concentrating or making decisions? N N  Walking or climbing stairs? N N  Dressing or bathing? N N  Doing errands, shopping? N N  Preparing Food and eating ? N -  Using  the Toilet? N -  In the past six months, have you accidently leaked urine? N -  Do you have problems with loss of bowel control? N -  Managing your Medications? N -  Managing your Finances? N -  Housekeeping or managing your Housekeeping? N -  Some recent data might be hidden    Patient Care Team: Steele Sizer, MD as PCP - General (Family Medicine) Georg Ruddle, Ashok Cordia, MD as Referring Physician (Cardiology) Adele Schilder Arlyce Harman, MD as Consulting Physician (Psychiatry) Janyce Llanos, NP as Nurse Practitioner (Cardiology) Billey Co, MD as Consulting Physician (Urology) Virgel Manifold, MD (Gastroenterology)  Indicate any recent Medical Services you may have received from other than [facility] providers in the past year (date may be approximate).     Assessment:   This is a routine wellness examination for [place].  Hearing/Vision screen Hearing Screening - Comments:: Pt denies hearing difficulty Vision Screening - Comments:: Past due for eye exam, declines referral to opthamology  Dietary issues and exercise activities discussed: Current Exercise Habits: The patient does not participate in regular exercise at present, Exercise limited by: None  identified   Goals Addressed             This Visit's Progress    Exercise 3x per week (30 min per time)   Not on track    Recommend walking three times a week for 20-30 minutes.        Depression Screen PHQ 2/9 Scores 11/01/2021 07/08/2021 01/04/2021 10/19/2020 07/06/2020 11/11/2019 05/16/2019  PHQ - 2 Score 0 0 0 0 0 0 0  PHQ- 9 Score - 2 - - - 0 -  Some encounter information is confidential and restricted. Go to Review Flowsheets activity to see all data.    Fall Risk Fall Risk  11/01/2021 07/08/2021 01/04/2021 10/19/2020 07/06/2020  Falls in the past year? 0 0 0 0 0  Number falls in past yr: 0 - 0 0 0  Injury with Fall? 0 - 0 0 0  Risk for fall due to : No Fall Risks - - No Fall Risks -  Risk for fall due to: Comment - - - - -  Follow up Falls prevention discussed Falls prevention discussed - Falls prevention discussed -  Some encounter information is confidential and restricted. Go to Review Flowsheets activity to see all data.    FALL RISK PREVENTION PERTAINING TO THE HOME:  Any stairs in or around the home? Yes If so, are there any without handrails? No  Home free of loose throw rugs in walkways, pet beds, electrical cords, etc? Yes  Adequate lighting in your home to reduce risk of falls? Yes   ASSISTIVE DEVICES UTILIZED TO PREVENT FALLS:  Life alert? No  Use of a cane, walker or w/c? No  Grab bars in the bathroom? No  Shower chair or bench in shower? No  Elevated toilet seat or a handicapped toilet? No   TIMED UP AND GO:  Was the test performed? No . Telephonic visit.   Cognitive Function: Normal cognitive status assessed by direct observation by this Nurse Health Advisor. No abnormalities found.       6CIT Screen 10/19/2020 05/16/2019  What Year? 0 points 0 points  What month? 0 points 0 points  What time? 0 points 0 points  Count back from 20 0 points 0 points  Months in reverse 0 points 0 points  Repeat phrase 10 points 6 points  Total Score 10 6  Some  encounter information is confidential and restricted. Go to Review Flowsheets activity to see all data.    Immunizations Immunization History  Administered Date(s) Administered   Fluad Quad(high Dose 65+) 07/06/2020   Influenza, High Dose Seasonal PF 10/28/2015, 09/20/2016, 08/02/2017, 11/06/2018, 06/05/2019, 07/04/2021   Influenza, Seasonal, Injecte, Preservative Fre 07/16/2013, 05/16/2014, 08/02/2017   Influenza-Unspecified 07/16/2013, 05/16/2014, 10/28/2015, 09/20/2016, 08/02/2017, 11/06/2018   PFIZER(Purple Top)SARS-COV-2 Vaccination 12/12/2019, 01/07/2020, 09/04/2020, 07/04/2021   Pneumococcal Conjugate-13 10/28/2015   Pneumococcal Polysaccharide-23 04/23/2014, 08/06/2019   Tdap 10/27/2005    TDAP status: Due, Education has been provided regarding the importance of this vaccine. Advised may receive this vaccine at local pharmacy or Health Dept. Aware to provide a copy of the vaccination record if obtained from local pharmacy or Health Dept. Verbalized acceptance and understanding.  Flu Vaccine status: Up to date  Pneumococcal vaccine status: Up to date  Covid-19 vaccine status: Completed vaccines  Qualifies for Shingles Vaccine? Yes   Zostavax completed No   Shingrix Completed?: No.    Education has been provided regarding the importance of this vaccine. Patient has been advised to call insurance company to determine out of pocket expense if they have not yet received this vaccine. Advised may also receive vaccine at local pharmacy or Health Dept. Verbalized acceptance and understanding.  Screening Tests Health Maintenance  Topic Date Due   Zoster Vaccines- Shingrix (1 of 2) Never done   COVID-19 Vaccine (5 - Booster for Pfizer series) 08/29/2021   TETANUS/TDAP  10/16/2026 (Originally 10/28/2015)   COLONOSCOPY (Pts 45-41yrs Insurance coverage will need to be confirmed)  11/26/2021   Pneumonia Vaccine 59+ Years old  Completed   INFLUENZA VACCINE  Completed   Hepatitis C  Screening  Completed   HPV VACCINES  Aged Out    Health Maintenance  Health Maintenance Due  Topic Date Due   Zoster Vaccines- Shingrix (1 of 2) Never done   COVID-19 Vaccine (5 - Booster for Pfizer series) 08/29/2021    Colorectal cancer screening: Type of screening: Colonoscopy. Completed 11/26/20. Repeat every 1 years. Pt received notice in the mail to contact [facility] for repeat screening colonoscopy.   Lung Cancer Screening: (Low Dose CT Chest recommended if Age 42-80 years, 30 pack-year currently smoking OR have quit w/in 15years.) does not qualify.    Additional Screening:  Hepatitis C Screening: does qualify; Completed 07/06/20  Vision Screening: Recommended annual ophthalmology exams for early detection of glaucoma and other disorders of the eye. Is the patient up to date with their annual eye exam?  No  Who is the provider or what is the name of the office in which the patient attends annual eye exams? Not established If pt is not established with a provider, would they like to be referred to a provider to establish care? No .   Dental Screening: Recommended annual dental exams for proper oral hygiene  Community Resource Referral / Chronic Care Management: CRR required this visit?  No   CCM required this visit?  No      Plan:     I have personally reviewed and noted the following in the patients chart:   Medical and social history Use of alcohol, tobacco or illicit drugs  Current medications and supplements including opioid prescriptions. Patient is not currently taking opioid prescriptions. Functional ability and status Nutritional status Physical activity Advanced directives List of other physicians Hospitalizations, surgeries, and ER visits in previous 12 months Vitals Screenings to include cognitive, depression, and falls Referrals and appointments  In addition, I have reviewed and discussed with patient certain preventive protocols,  quality metrics, and best practice recommendations. A written personalized care plan for preventive services as well as general preventive health recommendations were provided to patient.     Clemetine Marker, LPN   [id number]   Nurse Notes: none

## 2021-11-01 NOTE — Patient Instructions (Signed)
Jeffrey Herring , Thank you for taking time to come for your Medicare Wellness Visit. I appreciate your ongoing commitment to your health goals. Please review the following plan we discussed and let me know if I can assist you in the future.   Screening recommendations/referrals: Colonoscopy: done 11/26/20. Please call [facility] for repeat screening colonoscopy at [phone] Recommended yearly ophthalmology/optometry visit for glaucoma screening and checkup Recommended yearly dental visit for hygiene and checkup  Vaccinations: Influenza vaccine: done 07/04/21 Pneumococcal vaccine: done 08/06/19 Tdap vaccine: due Shingles vaccine: Shingrix discussed. Please contact your pharmacy for coverage information.  Covid-19:  done 12/12/19, 01/07/20, 09/04/20 & 07/04/21  Advanced directives: Please bring a copy of your health care power of attorney and living will to the office at your convenience once you have completed those documents  Conditions/risks identified: Recommend increasing physical activity   Next appointment: Follow up in one year for your annual wellness visit.   Preventive Care 72 Years and Older, Male Preventive care refers to lifestyle choices and visits with your health care provider that can promote health and wellness. What does preventive care include? A yearly physical exam. This is also called an annual well check. Dental exams once or twice a year. Routine eye exams. Ask your health care provider how often you should have your eyes checked. Personal lifestyle choices, including: Daily care of your teeth and gums. Regular physical activity. Eating a healthy diet. Avoiding tobacco and drug use. Limiting alcohol use. Practicing safe sex. Taking low doses of aspirin every day. Taking vitamin and mineral supplements as recommended by your health care provider. What happens during an annual well check? The services and screenings done by your health care provider  during your annual well check will depend on your age, overall health, lifestyle risk factors, and family history of disease. Counseling  Your health care provider may ask you questions about your: Alcohol use. Tobacco use. Drug use. Emotional well-being. Home and relationship well-being. Sexual activity. Eating habits. History of falls. Memory and ability to understand (cognition). Work and work Statistician. Screening  You may have the following tests or measurements: Height, weight, and BMI. Blood pressure. Lipid and cholesterol levels. These may be checked every 5 years, or more frequently if you are over 64 years old. Skin check. Lung cancer screening. You may have this screening every year starting at age 44 if you have a 30-pack-year history of smoking and currently smoke or have quit within the past 15 years. Fecal occult blood test (FOBT) of the stool. You may have this test every year starting at age 65. Flexible sigmoidoscopy or colonoscopy. You may have a sigmoidoscopy every 5 years or a colonoscopy every 10 years starting at age 59. Prostate cancer screening. Recommendations will vary depending on your family history and other risks. Hepatitis C blood test. Hepatitis B blood test. Sexually transmitted disease (STD) testing. Diabetes screening. This is done by checking your blood sugar (glucose) after you have not eaten for a while (fasting). You may have this done every 1-3 years. Abdominal aortic aneurysm (AAA) screening. You may need this if you are a current or former smoker. Osteoporosis. You may be screened starting at age 60 if you are at high risk. Talk with your health care provider about your test results, treatment options, and if necessary, the need for more tests. Vaccines  Your health care provider may recommend certain vaccines, such as: Influenza vaccine. This is recommended every year. Tetanus, diphtheria, and acellular pertussis (Tdap,  Td) vaccine. You  may need a Td booster every 10 years. Zoster vaccine. You may need this after age 27. Pneumococcal 13-valent conjugate (PCV13) vaccine. One dose is recommended after age 21. Pneumococcal polysaccharide (PPSV23) vaccine. One dose is recommended after age 55. Talk to your health care provider about which screenings and vaccines you need and how often you need them. This information is not intended to replace advice given to you by your health care provider. Make sure you discuss any questions you have with your health care provider. Document Released: 10/29/2015 Document Revised: 06/21/2016 Document Reviewed: 08/03/2015 Elsevier Interactive Patient Education  2017 [place] Prevention in the Home Falls can cause injuries. They can happen to people of all ages. There are many things you can do to make your home safe and to help prevent falls. What can I do on the outside of my home? Regularly fix the edges of walkways and driveways and fix any cracks. Remove anything that might make you trip as you walk through a door, such as a raised step or threshold. Trim any bushes or trees on the path to your home. Use bright outdoor lighting. Clear any walking paths of anything that might make someone trip, such as rocks or tools. Regularly check to see if handrails are loose or broken. Make sure that both sides of any steps have handrails. Any raised decks and porches should have guardrails on the edges. Have any leaves, snow, or ice cleared regularly. Use sand or salt on walking paths during winter. Clean up any spills in your garage right away. This includes oil or grease spills. What can I do in the bathroom? Use night lights. Install grab bars by the toilet and in the tub and shower. Do not use towel bars as grab bars. Use non-skid mats or decals in the tub or shower. If you need to sit down in the shower, use a plastic, non-slip stool. Keep the floor dry. Clean up any water that spills  on the floor as soon as it happens. Remove soap buildup in the tub or shower regularly. Attach bath mats securely with double-sided non-slip rug tape. Do not have throw rugs and other things on the floor that can make you trip. What can I do in the bedroom? Use night lights. Make sure that you have a light by your bed that is easy to reach. Do not use any sheets or blankets that are too big for your bed. They should not hang down onto the floor. Have a firm chair that has side arms. You can use this for support while you get dressed. Do not have throw rugs and other things on the floor that can make you trip. What can I do in the kitchen? Clean up any spills right away. Avoid walking on wet floors. Keep items that you use a lot in easy-to-reach places. If you need to reach something above you, use a strong step stool that has a grab bar. Keep electrical cords out of the way. Do not use floor polish or wax that makes floors slippery. If you must use wax, use non-skid floor wax. Do not have throw rugs and other things on the floor that can make you trip. What can I do with my stairs? Do not leave any items on the stairs. Make sure that there are handrails on both sides of the stairs and use them. Fix handrails that are broken or loose. Make sure that handrails are as  long as the stairways. Check any carpeting to make sure that it is firmly attached to the stairs. Fix any carpet that is loose or worn. Avoid having throw rugs at the top or bottom of the stairs. If you do have throw rugs, attach them to the floor with carpet tape. Make sure that you have a light switch at the top of the stairs and the bottom of the stairs. If you do not have them, ask someone to add them for you. What else can I do to help prevent falls? Wear shoes that: Do not have high heels. Have rubber bottoms. Are comfortable and fit you well. Are closed at the toe. Do not wear sandals. If you use a stepladder: Make  sure that it is fully opened. Do not climb a closed stepladder. Make sure that both sides of the stepladder are locked into place. Ask someone to hold it for you, if possible. Clearly mark and make sure that you can see: Any grab bars or handrails. First and last steps. Where the edge of each step is. Use tools that help you move around (mobility aids) if they are needed. These include: Canes. Walkers. Scooters. Crutches. Turn on the lights when you go into a dark area. Replace any light bulbs as soon as they burn out. Set up your furniture so you have a clear path. Avoid moving your furniture around. If any of your floors are uneven, fix them. If there are any pets around you, be aware of where they are. Review your medicines with your doctor. Some medicines can make you feel dizzy. This can increase your chance of falling. Ask your doctor what other things that you can do to help prevent falls. This information is not intended to replace advice given to you by your health care provider. Make sure you discuss any questions you have with your health care provider. Document Released: 07/29/2009 Document Revised: 03/09/2016 Document Reviewed: 11/06/2014 Elsevier Interactive Patient Education  2017 [employer].

## 2021-11-10 ENCOUNTER — Telehealth (HOSPITAL_BASED_OUTPATIENT_CLINIC_OR_DEPARTMENT_OTHER): Payer: Medicare Other | Admitting: Psychiatry

## 2021-11-10 ENCOUNTER — Encounter (HOSPITAL_COMMUNITY): Payer: Self-pay | Admitting: Psychiatry

## 2021-11-10 ENCOUNTER — Telehealth (HOSPITAL_COMMUNITY): Payer: Medicare Other | Admitting: Psychiatry

## 2021-11-10 ENCOUNTER — Other Ambulatory Visit: Payer: Self-pay

## 2021-11-10 DIAGNOSIS — F411 Generalized anxiety disorder: Secondary | ICD-10-CM | POA: Diagnosis not present

## 2021-11-10 DIAGNOSIS — F319 Bipolar disorder, unspecified: Secondary | ICD-10-CM | POA: Diagnosis not present

## 2021-11-10 MED ORDER — OLANZAPINE 15 MG PO TABS: ORAL_TABLET | ORAL | 1 refills | Status: DC

## 2021-11-10 MED ORDER — ALPRAZOLAM 0.5 MG PO TABS: 0.5000 mg | ORAL_TABLET | Freq: Three times a day (TID) | ORAL | 5 refills | Status: DC

## 2021-11-10 MED ORDER — LAMOTRIGINE 200 MG PO TABS: 200.0000 mg | ORAL_TABLET | Freq: Every day | ORAL | 1 refills | Status: DC

## 2021-11-10 NOTE — Progress Notes (Signed)
Virtual Visit via Telephone Note  I connected with Jeffrey Herring on 11/10/21 at 10:00 AM EST by telephone and verified that I am speaking with the correct person using two identifiers.  Location: Patient: Home Provider: Home Office   I discussed the limitations, risks, security and privacy concerns of performing an evaluation and management service by telephone and the availability of in person appointments. I also discussed with the patient that there may be a patient responsible charge related to this service. The patient expressed understanding and agreed to proceed.   History of Present Illness: Patient is evaluated by phone session.  His wife Jeffrey Herring was also present in the session.  Patient reported things are going well and he denies any mania, hallucination or any agitation.  He is driving short distance but admitted sometime not able to remember things very well.  He usually drives when his wife is with him.  He still feels anxious and nervous around people and in crowded places.  His wife manages all his medication.  Recently had a visit with a cardiology and had a blood work.  His PSA is high which is 9.81 and he was referred to urology however has not been heard from neurology scheduled appointment.  His hemoglobin A1c is 5.4.  His creatinine is 2.4 and BUN 36 which is stable.  His wife reported that he is sometimes very tired, lack of motivation to do things.  He sleeps too much.  However he denies any crying spells, anhedonia or any feeling of hopelessness or worthlessness.  His appetite is okay.  He does enjoy seeing grandkids.  He is taking olanzapine, Xanax and Lamictal.  He has no rash or any itching.  He has no tremor or shakes.  Past Psychiatric History: Reviewed. H/O bipolar disorder. H/O inpatient mania and psychosis.  Good response with lithium until had a lithium toxicity.  Took Abilify, Risperdal, Prozac, Lexapro, Wellbutrin and Navane.   Psychiatric Specialty Exam: Physical  Exam  Review of Systems  Weight 180 lb (81.6 kg).There is no height or weight on file to calculate BMI.  General Appearance: NA  Eye Contact:  NA  Speech:  Slow  Volume:  Decreased  Mood:  Euthymic  Affect:  NA  Thought Process:  Descriptions of Associations: Intact  Orientation:  Full (Time, Place, and Person)  Thought Content:  Logical  Suicidal Thoughts:  No  Homicidal Thoughts:  No  Memory:  Immediate;   Fair Recent;   Fair Remote;   Fair  Judgement:  Intact  Insight:  Present  Psychomotor Activity:  NA  Concentration:  Concentration: Fair and Attention Span: Fair  Recall:  [employer] of Knowledge:  Fair  Language:  Fair  Akathisia:  No  Handed:  Right  AIMS (if indicated):     Assets:  Communication Skills Desire for Improvement Housing Social Support Transportation  ADL's:  Intact  Cognition:  Impaired,  Mild  Sleep:   too much sleep      Assessment and Plan: Bipolar disorder type I.  Generalized anxiety disorder.  I reviewed his current medication, blood work results.  Today I again discussed try to cut down his medication since patient is stable and now wife is concerned that he has been sleeping too much and lack of motivation and feeling tired.  I recommend we can try cutting olanzapine from 20mg  to 15 mg if that helps his energy level.  Patient and his wife agree with the plan.  We  will keep the Lamictal 200 mg daily and Xanax 0.5 mg 3 times a day.  I recommended if patient feel or his wife feels her symptoms are coming back then they should call us back.  I also encouraged to call back urology to schedule appointment as patient has a high PSA.  We will send a new prescription of olanzapine 15 mg at bedtime and continue Lamictal 200 mg daily and Xanax 0.5 mg 3 times a day.  Recommended to call us back if is any question or any concern.  Follow-up in 6 months.  Follow Up Instructions:    I discussed the assessment and treatment plan with the patient. The  patient was provided an opportunity to ask questions and all were answered. The patient agreed with the plan and demonstrated an understanding of the instructions.   The patient was advised to call back or seek an in-person evaluation if the symptoms worsen or if the condition fails to improve as anticipated.  I provided 25 minutes of non-face-to-face time during this encounter.   Jeffrey Nations, MD

## 2021-12-12 DIAGNOSIS — I5022 Chronic systolic (congestive) heart failure: Secondary | ICD-10-CM | POA: Diagnosis not present

## 2022-01-04 DIAGNOSIS — I442 Atrioventricular block, complete: Secondary | ICD-10-CM | POA: Diagnosis not present

## 2022-01-06 ENCOUNTER — Ambulatory Visit: Payer: Medicare Other | Admitting: Family Medicine

## 2022-01-12 ENCOUNTER — Ambulatory Visit: Payer: Medicare Other | Admitting: Internal Medicine

## 2022-02-10 NOTE — Progress Notes (Signed)
Name: Jeffrey Herring   MRN: [id number]    DOB: 01-13-1951   Date:02/13/2022 ? ?     Progress Note ? ?Subjective ? ?Chief Complaint ? ?Follow Up ? ?HPI ? ?CHF: under the care of Dr. Prince Rome at [facility]., EF up from  25-30 % to 30-35 %  he  denies orthopnea this time,  he always uses two pillows, he denies chest pain or palpitation, he has SOB with minimal exertionHe has a pacemaker.  First one in 1983, had some vegetation on pacemaker leads back in 2009 and also sees Dr. Georg Ruddle at [facility] for  pacemaker was replaced again 01/2018 He had multiple replacements in between and currently located on abdominal wall because of scar tissue. He has  one vessel  CAD and is on Plavix ,he also takes Crestor  but last LDL was 85 , we will recheck level today and if still above 70  we will increase dose of Crestor to 40 mg daily  He is back on low metoprolol and also taking Hydralazine , bp is at goal.  ? ?Echo 12/13/2021 at [place]:  ? ?Left Ventricle: Inferior akinesis.  Anteroseptal/septal dyskinesis  ?probably due to pacemaker activation. ?  Left Ventricle: EF: 30-35%. ?  Mitral Valve: There is moderate regurgitation. ?  Tricuspid Valve: There is mild to moderate regurgitation. ?  ?CKI:  Currently under the care of [facility] nephrologist,  last labs were stable, GFR was 24 ,also microalbuminuria at 59 , wife states they have another virtual visit scheduled and his wife asked me to check labs so it can be reviewed by nephrologist  ?  ?Bipolar disorder I: he is under the care of Dr. Adele Schilder , he is still taking Lamictal, Zyprexa but dose was decreased from 20 to 15 mg because he was getting sleepy but wife has not noticed any improvement of daytime fatigue, he is still on same dose of  alprazolam . No recent manic episodes, He has been  disabled since age 2 . He came in with his wife, he states he is feeling well, mood has been leveled out. He still has lack of motivation, but wife states he has been stable for a long time.  ? ?BPH: he  has urinary frequency, taking Flomax to control symptoms. He has nocturia only once per night, stable. He was seen by Dr. Diamantina Providence, and was supposed a CT done but never got the appointment, last PSA was elevated again at 9.81 and we will place a new order  ?  ?HTN: taking medication, no chest pain or palpitation. He has occasional SOB, discussed deconditioning. Stable, BP today is at goal  ?  ?Hyperlipidemia: he is on Rosuvastatin , denies myalgia, reviewed labs with patient , LDL went up a little but wife prefers checking it again today before adjusting medication ? ? ?Patient Active Problem List  ? Diagnosis Date Noted  ? Polyp of sigmoid colon   ? Nodule of rectum   ? Polyp of ascending colon   ? Hyperparathyroidism, secondary renal ([place]) 07/06/2020  ? Nasal congestion with rhinorrhea 11/06/2018  ? Chronic kidney disease, stage 4 (severe) ([place]) 10/24/2017  ? Dilated cardiomyopathy ([place]) 02/18/2017  ? BPH (benign prostatic hyperplasia) 11/04/2015  ? Chronic systolic heart failure ([place]) 10/28/2015  ? HLD (hyperlipidemia) 10/28/2015  ? H/O deep venous thrombosis 10/28/2015  ? History of sepsis 04/09/2014  ? Inguinal hernia 01/06/2014  ? Fitting or adjustment of cardiac pacemaker 01/05/2014  ? Complete atrioventricular block ([place]) 10/14/2013  ?  Sinoatrial node dysfunction ([place]) 10/14/2013  ? Kidney cysts 05/06/2013  ? Hypertension, benign 10/01/2012  ? Healed or old pulmonary embolism 09/30/2012  ? Artificial cardiac pacemaker 09/30/2012  ? Bipolar 1 disorder ([place]) 05/01/2012  ? Arteriosclerosis of coronary artery 06/16/2009  ? ? ?Past Surgical History:  ?Procedure Laterality Date  ? BACK SURGERY    ? CARDIAC SURGERY    ? COLONOSCOPY WITH PROPOFOL N/A 11/26/2020  ? Procedure: COLONOSCOPY WITH PROPOFOL;  Surgeon: Virgel Manifold, MD;  Location: [facility] ENDOSCOPY;  Service: Endoscopy;  Laterality: N/A;  ? HERNIA REPAIR Right 02/05/14  ? NOSE SURGERY    ? PACEMAKER PLACEMENT    ? SPINE SURGERY    ? ? ?Family History   ?Problem Relation Age of Onset  ? Depression Mother   ? ? ?Social History  ? ?Tobacco Use  ? Smoking status: Former  ?  Types: Cigars  ?  Quit date: 02/05/2018  ?  Years since quitting: 4.0  ? Smokeless tobacco: Never  ? Tobacco comments:  ?  smoking cessation materials not required  ?Substance Use Topics  ? Alcohol use: No  ?  Alcohol/week: 0.0 standard drinks  ? ? ? ?Current Outpatient Medications:  ?  ALPRAZolam (XANAX) 0.5 MG tablet, Take 1 tablet (0.5 mg total) by mouth 3 (three) times daily., Disp: 90 tablet, Rfl: 5 ?  cholecalciferol (VITAMIN D) 1000 units tablet, Take 1,000 Units by mouth daily., Disp: , Rfl:  ?  clopidogrel (PLAVIX) 75 MG tablet, Take 1 tablet (75 mg total) by mouth every morning., Disp: 90 tablet, Rfl: 1 ?  hydrALAZINE (APRESOLINE) 10 MG tablet, Take 1 tablet by mouth 2 (two) times a day., Disp: , Rfl:  ?  lamoTRIgine (LAMICTAL) 200 MG tablet, Take 1 tablet (200 mg total) by mouth daily., Disp: 90 tablet, Rfl: 1 ?  metoprolol succinate (TOPROL-XL) 25 MG 24 hr tablet, Take 0.5 tablets by mouth daily., Disp: , Rfl:  ?  OLANZapine (ZYPREXA) 15 MG tablet, TAKE 1 TABLET BY MOUTH ATBEDTIME., Disp: 90 tablet, Rfl: 1 ?  rosuvastatin (CRESTOR) 20 MG tablet, TAKE 1 TABLET BY MOUTH DAILY, Disp: 90 tablet, Rfl: 1 ?  tamsulosin (FLOMAX) 0.4 MG CAPS capsule, Take 1 capsule (0.4 mg total) by mouth every morning., Disp: 90 capsule, Rfl: 1 ? ?Allergies  ?Allergen Reactions  ? Aspirin Hives, Shortness Of Breath and Palpitations  ? Codeine Nausea And Vomiting  ? Demerol [Meperidine] Nausea And Vomiting  ? Lithium   ?   acute renal failure  ? ? ?I personally reviewed active problem list, medication list, allergies, family history, social history, health maintenance with the patient/caregiver today. ? ? ?ROS ? ?Constitutional: Negative for fever or weight change.  ?Respiratory: Negative for cough , positive for shortness of breath.   ?Cardiovascular: Negative for chest pain or palpitations.  ?Gastrointestinal:  Negative for abdominal pain, no bowel changes.  ?Musculoskeletal: Negative for gait problem or joint swelling.  ?Skin: Negative for rash.  ?Neurological: Negative for dizziness or headache.  ?No other specific complaints in a complete review of systems (except as listed in HPI above).  ? ?Objective ? ?Vitals:  ? 02/13/22 1430  ?BP: 136/76  ?Pulse: 89  ?Resp: 16  ?SpO2: 95%  ?Weight: 182 lb (82.6 kg)  ?Height: '5\' 10"'$  (1.778 m)  ? ? ?Body mass index is 26.11 kg/m?. ? ?Physical Exam ? ?Constitutional: Patient appears well-developed and well-nourished.  No distress.  ?HEENT: head atraumatic, normocephalic, pupils equal and reactive to light,  neck  supple ?Cardiovascular: Normal rate, regular rhythm and normal heart sounds.  No murmur heard. No BLE edema. ?Pulmonary/Chest: Effort normal and breath sounds normal. No respiratory distress. ?Abdominal: Soft.  There is no tenderness. ?Psychiatric: Patient has a normal mood and affect. behavior is normal. Judgment and thought content normal.  ?Neurological: slow gait, lack of facial expression, fine tremors of hands ? ? ?PHQ2/9: ? ?  02/13/2022  ?  2:30 PM 11/01/2021  ?  3:09 PM 07/08/2021  ? 11:31 AM 01/04/2021  ? 10:56 AM 10/19/2020  ? 10:53 AM  ?Depression screen PHQ 2/9  ?Decreased Interest 0 0 0 0 0  ?Down, Depressed, Hopeless 0 0 0 0 0  ?PHQ - 2 Score 0 0 0 0 0  ?Altered sleeping 0  1    ?Tired, decreased energy 0  1    ?Change in appetite 0  0    ?Feeling bad or failure about yourself  0  0    ?Trouble concentrating 0  0    ?Moving slowly or fidgety/restless 0  0    ?Suicidal thoughts 0  0    ?PHQ-9 Score 0  2    ?Difficult doing work/chores   Not difficult at all    ?  ?phq 9 is negative ? ? ?Fall Risk: ? ?  02/13/2022  ?  2:30 PM 11/01/2021  ?  3:11 PM 07/08/2021  ? 11:30 AM 01/04/2021  ? 10:56 AM 10/19/2020  ? 10:55 AM  ?Fall Risk   ?Falls in the past year? 0 0 0 0 0  ?Number falls in past yr: 0 0  0 0  ?Injury with Fall? 0 0  0 0  ?Risk for fall due to : No Fall Risks No Fall  Risks   No Fall Risks  ?Follow up Falls prevention discussed Falls prevention discussed Falls prevention discussed  Falls prevention discussed  ? ? ? ? ?Functional Status Survey: ?Is the patient deaf or ha

## 2022-02-13 ENCOUNTER — Encounter: Payer: Self-pay | Admitting: Family Medicine

## 2022-02-13 ENCOUNTER — Ambulatory Visit (INDEPENDENT_AMBULATORY_CARE_PROVIDER_SITE_OTHER): Payer: Medicare Other | Admitting: Family Medicine

## 2022-02-13 VITALS — BP 136/76 | HR 89 | Resp 16 | Ht 70.0 in | Wt 182.0 lb

## 2022-02-13 DIAGNOSIS — R972 Elevated prostate specific antigen [PSA]: Secondary | ICD-10-CM | POA: Diagnosis not present

## 2022-02-13 DIAGNOSIS — N138 Other obstructive and reflux uropathy: Secondary | ICD-10-CM | POA: Diagnosis not present

## 2022-02-13 DIAGNOSIS — N184 Chronic kidney disease, stage 4 (severe): Secondary | ICD-10-CM

## 2022-02-13 DIAGNOSIS — N2581 Secondary hyperparathyroidism of renal origin: Secondary | ICD-10-CM

## 2022-02-13 DIAGNOSIS — I495 Sick sinus syndrome: Secondary | ICD-10-CM

## 2022-02-13 DIAGNOSIS — Z08 Encounter for follow-up examination after completed treatment for malignant neoplasm: Secondary | ICD-10-CM

## 2022-02-13 DIAGNOSIS — E7800 Pure hypercholesterolemia, unspecified: Secondary | ICD-10-CM | POA: Diagnosis not present

## 2022-02-13 DIAGNOSIS — R260 Ataxic gait: Secondary | ICD-10-CM

## 2022-02-13 DIAGNOSIS — R739 Hyperglycemia, unspecified: Secondary | ICD-10-CM

## 2022-02-13 DIAGNOSIS — I2510 Atherosclerotic heart disease of native coronary artery without angina pectoris: Secondary | ICD-10-CM | POA: Diagnosis not present

## 2022-02-13 DIAGNOSIS — I442 Atrioventricular block, complete: Secondary | ICD-10-CM | POA: Diagnosis not present

## 2022-02-13 DIAGNOSIS — Z85038 Personal history of other malignant neoplasm of large intestine: Secondary | ICD-10-CM

## 2022-02-13 DIAGNOSIS — I5022 Chronic systolic (congestive) heart failure: Secondary | ICD-10-CM | POA: Diagnosis not present

## 2022-02-13 DIAGNOSIS — I420 Dilated cardiomyopathy: Secondary | ICD-10-CM | POA: Diagnosis not present

## 2022-02-13 DIAGNOSIS — N401 Enlarged prostate with lower urinary tract symptoms: Secondary | ICD-10-CM | POA: Diagnosis not present

## 2022-02-13 DIAGNOSIS — Z1211 Encounter for screening for malignant neoplasm of colon: Secondary | ICD-10-CM

## 2022-02-13 DIAGNOSIS — R251 Tremor, unspecified: Secondary | ICD-10-CM

## 2022-02-13 DIAGNOSIS — F319 Bipolar disorder, unspecified: Secondary | ICD-10-CM | POA: Diagnosis not present

## 2022-02-13 MED ORDER — CLOPIDOGREL BISULFATE 75 MG PO TABS: 75.0000 mg | ORAL_TABLET | Freq: Every morning | ORAL | 1 refills | Status: DC

## 2022-02-13 MED ORDER — ROSUVASTATIN CALCIUM 20 MG PO TABS: 20.0000 mg | ORAL_TABLET | Freq: Every day | ORAL | 1 refills | Status: DC

## 2022-02-13 MED ORDER — TAMSULOSIN HCL 0.4 MG PO CAPS: 0.4000 mg | ORAL_CAPSULE | Freq: Every morning | ORAL | 1 refills | Status: DC

## 2022-02-14 DIAGNOSIS — R739 Hyperglycemia, unspecified: Secondary | ICD-10-CM | POA: Diagnosis not present

## 2022-02-14 DIAGNOSIS — R972 Elevated prostate specific antigen [PSA]: Secondary | ICD-10-CM | POA: Diagnosis not present

## 2022-02-14 DIAGNOSIS — E7800 Pure hypercholesterolemia, unspecified: Secondary | ICD-10-CM | POA: Diagnosis not present

## 2022-02-14 DIAGNOSIS — I2510 Atherosclerotic heart disease of native coronary artery without angina pectoris: Secondary | ICD-10-CM | POA: Diagnosis not present

## 2022-02-14 DIAGNOSIS — N2581 Secondary hyperparathyroidism of renal origin: Secondary | ICD-10-CM | POA: Diagnosis not present

## 2022-02-14 DIAGNOSIS — N138 Other obstructive and reflux uropathy: Secondary | ICD-10-CM | POA: Diagnosis not present

## 2022-02-14 DIAGNOSIS — I420 Dilated cardiomyopathy: Secondary | ICD-10-CM | POA: Diagnosis not present

## 2022-02-14 DIAGNOSIS — N401 Enlarged prostate with lower urinary tract symptoms: Secondary | ICD-10-CM | POA: Diagnosis not present

## 2022-02-14 LAB — HEMOGLOBIN A1C
Hgb A1c MFr Bld: 5.4 % of total Hgb (ref <5.7)
Mean Plasma Glucose: 108 mg/dL
eAG (mmol/L): 6 mmol/L

## 2022-02-14 LAB — COMPLETE METABOLIC PANEL WITH GFR
AG Ratio: 1.7 (calc) (ref 1.0–2.5)
ALT: 14 U/L (ref 9–46)
AST: 15 U/L (ref 10–35)
Albumin: 4.8 g/dL (ref 3.6–5.1)
Alkaline phosphatase (APISO): 74 U/L (ref 35–144)
BUN/Creatinine Ratio: 11 (calc) (ref 6–22)
BUN: 37 mg/dL — ABNORMAL HIGH (ref 7–25)
CO2: 21 mmol/L (ref 20–32)
Calcium: 10.9 mg/dL — ABNORMAL HIGH (ref 8.6–10.3)
Chloride: 106 mmol/L (ref 98–110)
Creat: 3.29 mg/dL — ABNORMAL HIGH (ref 0.70–1.28)
Globulin: 2.8 g/dL (calc) (ref 1.9–3.7)
Glucose, Bld: 98 mg/dL (ref 65–99)
Potassium: 4.6 mmol/L (ref 3.5–5.3)
Sodium: 142 mmol/L (ref 135–146)
Total Bilirubin: 0.5 mg/dL (ref 0.2–1.2)
Total Protein: 7.6 g/dL (ref 6.1–8.1)
eGFR: 19 mL/min/{1.73_m2} — ABNORMAL LOW (ref >=60)

## 2022-02-14 LAB — LIPID PANEL
Cholesterol: 165 mg/dL (ref <200)
HDL: 53 mg/dL (ref >=40)
LDL Cholesterol (Calc): 86 mg/dL (calc)
Non-HDL Cholesterol (Calc): 112 mg/dL (calc) (ref <130)
Total CHOL/HDL Ratio: 3.1 (calc) (ref <5.0)
Triglycerides: 154 mg/dL — ABNORMAL HIGH (ref <150)

## 2022-02-14 LAB — VITAMIN D 25 HYDROXY (VIT D DEFICIENCY, FRACTURES): Vit D, 25-Hydroxy: 54 ng/mL (ref 30–100)

## 2022-02-14 LAB — CBC WITH DIFFERENTIAL/PLATELET
Absolute Monocytes: 906 cells/uL (ref 200–950)
Basophils Absolute: 53 cells/uL (ref 0–200)
Basophils Relative: 0.6 %
Eosinophils Absolute: 194 cells/uL (ref 15–500)
Eosinophils Relative: 2.2 %
HCT: 42.4 % (ref 38.5–50.0)
Hemoglobin: 14.2 g/dL (ref 13.2–17.1)
Lymphs Abs: 1267 cells/uL (ref 850–3900)
MCH: 30.5 pg (ref 27.0–33.0)
MCHC: 33.5 g/dL (ref 32.0–36.0)
MCV: 91.2 fL (ref 80.0–100.0)
MPV: 11.1 fL (ref 7.5–12.5)
Monocytes Relative: 10.3 %
Neutro Abs: 6380 cells/uL (ref 1500–7800)
Neutrophils Relative %: 72.5 %
Platelets: 194 10*3/uL (ref 140–400)
RBC: 4.65 10*6/uL (ref 4.20–5.80)
RDW: 13.4 % (ref 11.0–15.0)
Total Lymphocyte: 14.4 %
WBC: 8.8 10*3/uL (ref 3.8–10.8)

## 2022-02-14 LAB — PARATHYROID HORMONE, INTACT (NO CA): PTH: 108 pg/mL — ABNORMAL HIGH (ref 16–77)

## 2022-02-14 LAB — PSA: PSA: 11.65 ng/mL — ABNORMAL HIGH (ref <=4.00)

## 2022-02-15 LAB — MICROALBUMIN / CREATININE URINE RATIO
Creatinine, Urine: 68 mg/dL (ref 20–320)
Microalb Creat Ratio: 10 mcg/mg creat (ref <30)
Microalb, Ur: 0.7 mg/dL

## 2022-02-21 ENCOUNTER — Ambulatory Visit (INDEPENDENT_AMBULATORY_CARE_PROVIDER_SITE_OTHER): Payer: Medicare Other | Admitting: Urology

## 2022-02-21 ENCOUNTER — Other Ambulatory Visit: Payer: Self-pay | Admitting: *Deleted

## 2022-02-21 ENCOUNTER — Encounter: Payer: Self-pay | Admitting: Urology

## 2022-02-21 ENCOUNTER — Other Ambulatory Visit
Admission: RE | Admit: 2022-02-21 | Discharge: 2022-02-21 | Disposition: A | Discharge status: Home / Self Care | Payer: Medicare Other | Attending: Urology | Admitting: Urology

## 2022-02-21 VITALS — BP 127/80 | HR 89 | Ht 70.0 in | Wt 179.0 lb

## 2022-02-21 DIAGNOSIS — R350 Frequency of micturition: Secondary | ICD-10-CM

## 2022-02-21 DIAGNOSIS — R972 Elevated prostate specific antigen [PSA]: Secondary | ICD-10-CM | POA: Insufficient documentation

## 2022-02-21 DIAGNOSIS — R3912 Poor urinary stream: Secondary | ICD-10-CM | POA: Diagnosis not present

## 2022-02-21 DIAGNOSIS — R399 Unspecified symptoms and signs involving the genitourinary system: Secondary | ICD-10-CM

## 2022-02-21 LAB — URINALYSIS, COMPLETE (UACMP) WITH MICROSCOPIC
Bilirubin Urine: NEGATIVE
Glucose, UA: NEGATIVE mg/dL
Hgb urine dipstick: NEGATIVE
Ketones, ur: NEGATIVE mg/dL
Leukocytes,Ua: NEGATIVE
Nitrite: NEGATIVE
RBC / HPF: NONE SEEN RBC/hpf (ref 0–5)
Specific Gravity, Urine: 1.01 (ref 1.005–1.030)
WBC, UA: NONE SEEN WBC/hpf (ref 0–5)
pH: 5.5 (ref 5.0–8.0)

## 2022-02-21 LAB — BLADDER SCAN AMB NON-IMAGING

## 2022-02-21 NOTE — Progress Notes (Signed)
? ?  02/21/2022 ?1:03 PM  ? ?Jeffrey Herring ?22-Jul-1951 ?[id number] ? ?Reason for visit: Follow up elevated PSA, urinary symptoms ? ?HPI: ?Comorbid 71 year old male with bipolar disorder, depression, diabetes, history of DVT/PE, CKD with EGFR of 19, and pacemaker who remains on anticoagulation.  I saw him on 05/04/2021 when PSA continued to rise and was 12.1, and using shared decision making he opted for a prostate MRI.  This imaging was never completed, and he was referred for persistently elevated PSA of 11.65 on 02/13/2022.  DRE at our last visit was benign. ? ?He also reports some increased urinary frequency and weak stream.  He has been on Flomax long-term, PVR is normal at 125 mL today.  Urinalysis today benign with 0 WBCs, 0 RBCs, rare bacteria, trace protein, negative leukocytes, nitrite negative. ? ?We again reviewed options for his elevated PSA with his significant comorbidities as well as anticoagulation.  They are amenable to MRI as long as this is compatible with his pacemaker.  We will order prostate MRI which will hopefully give [place] further information about the prostate volume, bladder anatomy, and any suspicious lesions that may warrant biopsy.  Risks and benefits discussed at length, and we especially discussed the risk and benefits of PSA screening in men over age 39, especially with other comorbidities. ? ?Prostate MRI, follow-up to review results ? ? ?Billey Co, MD ? ?[facility] ?[address], [address] ?[place], [place] [address] ?[phone] ? ? ?

## 2022-02-21 NOTE — Patient Instructions (Signed)
Prostate MRI Prep: ? ?1- No ejaculation 48 hours prior to exam ? ?2- No food or drink or caffeine 4 hours prior to exam ? ?3- Fleets enema needs to be done 4 hours prior to exam  ? ?4- Urinate just prior to exam  ?

## 2022-02-22 DIAGNOSIS — N184 Chronic kidney disease, stage 4 (severe): Secondary | ICD-10-CM | POA: Diagnosis not present

## 2022-03-17 ENCOUNTER — Telehealth: Payer: Self-pay

## 2022-03-17 DIAGNOSIS — R972 Elevated prostate specific antigen [PSA]: Secondary | ICD-10-CM

## 2022-03-17 NOTE — Telephone Encounter (Signed)
Called pt, wife answers informed her of the information from Dr. Diamantina Providence per DPR. Wife voiced understanding. PSA ordered. Appt rescheduled.

## 2022-03-17 NOTE — Telephone Encounter (Signed)
-----   Message from Billey Co, MD sent at 03/15/2022  1:23 PM EDT ----- Regarding: FW: MRI He cannot get a prostate MRI secondary to the type of pacemaker that he has.  Please change his follow-up to 6 months with a PSA, reflex to free prior, and to discuss alternative options.  I think we can avoid biopsy at this time with the stable PSA even though it is elevated, especially with his other medical problems  Jeffrey Madrid, MD 03/15/2022   ----- Message ----- From: Benard Halsted Sent: 03/15/2022   9:18 AM EDT To: Billey Co, MD Subject: MRI                                            I received an email from April Pait in scheduling, She stated that he was unable to have an MRI done anywhere within the [facility] system due to his pacemaker. She said that per the supervisor in the MRI department it is unsafe. Please advise  Sharyn Lull

## 2022-04-04 ENCOUNTER — Ambulatory Visit: Payer: Medicare Other | Admitting: Urology

## 2022-04-25 DIAGNOSIS — I442 Atrioventricular block, complete: Secondary | ICD-10-CM | POA: Diagnosis not present

## 2022-05-11 ENCOUNTER — Telehealth (HOSPITAL_COMMUNITY): Payer: Medicare Other | Admitting: Psychiatry

## 2022-05-15 ENCOUNTER — Encounter (HOSPITAL_COMMUNITY): Payer: Self-pay | Admitting: Psychiatry

## 2022-05-15 ENCOUNTER — Telehealth (HOSPITAL_BASED_OUTPATIENT_CLINIC_OR_DEPARTMENT_OTHER): Payer: Medicare Other | Admitting: Psychiatry

## 2022-05-15 DIAGNOSIS — F411 Generalized anxiety disorder: Secondary | ICD-10-CM

## 2022-05-15 DIAGNOSIS — F319 Bipolar disorder, unspecified: Secondary | ICD-10-CM | POA: Diagnosis not present

## 2022-05-15 MED ORDER — LAMOTRIGINE 200 MG PO TABS: 200.0000 mg | ORAL_TABLET | Freq: Every day | ORAL | 0 refills | Status: DC

## 2022-05-15 MED ORDER — OLANZAPINE 15 MG PO TABS: ORAL_TABLET | ORAL | 0 refills | Status: DC

## 2022-05-15 MED ORDER — ALPRAZOLAM 0.5 MG PO TABS: 0.5000 mg | ORAL_TABLET | Freq: Three times a day (TID) | ORAL | 2 refills | Status: DC

## 2022-05-15 NOTE — Progress Notes (Signed)
Virtual Visit via Telephone Note  I connected with [name] on 05/15/22 at 10:00 AM EDT by telephone and verified that I am speaking with the correct person using two identifiers.  Location: Patient: Home Provider: Home Office   I discussed the limitations, risks, security and privacy concerns of performing an evaluation and management service by telephone and the availability of in person appointments. I also discussed with the patient that there may be a patient responsible charge related to this service. The patient expressed understanding and agreed to proceed.   History of Present Illness: Patient is evaluated by phone session.  His wife [name] is also on the phone.  Patient reported things are going very well but he still feels tired and too much sleep.  When I ask about the medication as he is supposed to get reduce olanzapine patient not aware and his wife reported that they are still getting old prescription of olanzapine.  It is unclear why they are getting a full prescription as last session we have reduced olanzapine 15 mg.  Patient and his wife like to cut down the dose.  Patient's wife apologized not clarifying with the pharmacy but promised to give them a call soon.  Overall wife reported patient is doing okay and denies any anger, irritability, mania or agitation.  Patient also reported his anxiety and mood is stable.  His wife reported he does not leave the house and he stays home watching TV.  His hemoglobin A1c is 5.4.  His creatinine is 3.29, BUN 37 and GFR 19.  His appetite is fair.  Past Psychiatric History: Reviewed. H/O bipolar disorder. H/O inpatient mania and psychosis.  Good response with lithium until had a lithium toxicity.  Took Abilify, Risperdal, Prozac, Lexapro, Wellbutrin and Navane.  Recent Results (from the past 2160 hour(s))  Microalbumin / creatinine urine ratio     Status: None   Collection Time: 02/14/22 11:31 AM  Result Value Ref Range    Creatinine, Urine 68 20 - 320 mg/dL   Microalb, Ur 0.7 mg/dL    Comment: Reference Range Not established    Microalb Creat Ratio 10 <30 mcg/mg creat    Comment: . The ADA defines abnormalities in albumin excretion as follows: Marland Kitchen Albuminuria Category        Result (mcg/mg creatinine) . Normal to Mildly increased   <30 Moderately increased         30-299  Severely increased           > OR = 300 . The ADA recommends that at least two of three specimens collected within a 3-6 month period be abnormal before considering a patient to be within a diagnostic category.   Urinalysis, Complete w Microscopic     Status: Abnormal   Collection Time: 02/21/22 10:24 AM  Result Value Ref Range   Color, Urine YELLOW YELLOW   APPearance CLEAR CLEAR   Specific Gravity, Urine 1.010 1.005 - 1.030   pH 5.5 5.0 - 8.0   Glucose, UA NEGATIVE NEGATIVE mg/dL   Hgb urine dipstick NEGATIVE NEGATIVE   Bilirubin Urine NEGATIVE NEGATIVE   Ketones, ur NEGATIVE NEGATIVE mg/dL   Protein, ur TRACE (A) NEGATIVE mg/dL   Nitrite NEGATIVE NEGATIVE   Leukocytes,Ua NEGATIVE NEGATIVE   Squamous Epithelial / LPF 0-5 0 - 5   WBC, UA NONE SEEN 0 - 5 WBC/hpf   RBC / HPF NONE SEEN 0 - 5 RBC/hpf   Bacteria, UA RARE (A) NONE SEEN  Hyaline Casts, UA PRESENT    Granular Casts, UA PRESENT    Waxy Casts, UA PRESENT     Comment: Performed at [facility], [address] [place], [place] [address]  BLADDER SCAN AMB NON-IMAGING     Status: None   Collection Time: 02/21/22 10:45 AM  Result Value Ref Range   Scan Result 180m       Psychiatric Specialty Exam: Physical Exam  Review of Systems  Weight 182 lb (82.6 kg).There is no height or weight on file to calculate BMI.  General Appearance: NA  Eye Contact:  NA  Speech:  Slow  Volume:  Decreased  Mood:  Euthymic and tired  Affect:  NA  Thought Process:  Descriptions of Associations: Intact  Orientation:  Full (Time, Place, and Person)  Thought  Content:  Logical  Suicidal Thoughts:  No  Homicidal Thoughts:  No  Memory:  Immediate;   Fair Recent;   Fair Remote;   Fair  Judgement:  Fair  Insight:  Fair  Psychomotor Activity:  NA  Concentration:  Concentration: Fair and Attention Span: Fair  Recall:  [employer] Knowledge:  Fair  Language:  Fair  Akathisia:  No  Handed:  Right  AIMS (if indicated):     Assets:  Desire for Improvement Housing Social Support  ADL's:  Intact  Cognition:  Impaired,  Mild  Sleep:   too much      Assessment and Plan: Bipolar disorder type I.  Generalized anxiety disorder.  I reviewed current medication and blood work results.  His wife reported that pharmacy is still giving the old prescription and does olanzapine 20 mg.  She never called the pharmacy to verify that we have reduced the dose.  I will send a new prescription olanzapine 15 mg and patient's wife promised that they will call pharmacy to get the correct dose.  Also talk about higher creatinine and discussed to lower the dose of Lamictal but patient and his wife at this time wants to try lower the olanzapine first because it is making him tired and too much sleep.  We discussed medication side effect specially high creatinine and may need to lower the dose in the future.  Patient and his wife agreed to have a follow-up in 3 months.  I recommend if symptoms started to get worse by lowering the dose of olanzapine then he should call [facility] immediately.  We will follow up in 3 months.  Follow Up Instructions:    I discussed the assessment and treatment plan with the patient. The patient was provided an opportunity to ask questions and all were answered. The patient agreed with the plan and demonstrated an understanding of the instructions.   The patient was advised to call back or seek an in-person evaluation if the symptoms worsen or if the condition fails to improve as anticipated.  Collaboration of Care: Other provider involved in  patient's care AEB notes are available in epic to review.  Patient/Guardian was advised Release of Information must be obtained prior to any record release in order to collaborate their care with an outside provider. Patient/Guardian was advised if they have not already done so to contact the registration department to sign all necessary forms in order for [facility] release information regarding their care.   Consent: Patient/Guardian gives verbal consent for treatment and assignment of benefits for services provided during this visit. Patient/Guardian expressed understanding and agreed to proceed.    I  provided 24 minutes of non-face-to-face time during this encounter.   [name], MD

## 2022-05-29 ENCOUNTER — Other Ambulatory Visit (HOSPITAL_COMMUNITY): Payer: Self-pay | Admitting: Psychiatry

## 2022-05-29 DIAGNOSIS — F319 Bipolar disorder, unspecified: Secondary | ICD-10-CM

## 2022-06-05 ENCOUNTER — Other Ambulatory Visit (HOSPITAL_COMMUNITY): Payer: Self-pay | Admitting: Psychiatry

## 2022-06-05 DIAGNOSIS — F319 Bipolar disorder, unspecified: Secondary | ICD-10-CM

## 2022-06-06 ENCOUNTER — Telehealth (HOSPITAL_COMMUNITY): Payer: Self-pay | Admitting: *Deleted

## 2022-06-06 ENCOUNTER — Other Ambulatory Visit (HOSPITAL_COMMUNITY): Payer: Self-pay | Admitting: *Deleted

## 2022-06-06 DIAGNOSIS — F319 Bipolar disorder, unspecified: Secondary | ICD-10-CM | POA: Diagnosis not present

## 2022-06-06 DIAGNOSIS — I442 Atrioventricular block, complete: Secondary | ICD-10-CM | POA: Diagnosis not present

## 2022-06-06 DIAGNOSIS — R4189 Other symptoms and signs involving cognitive functions and awareness: Secondary | ICD-10-CM | POA: Diagnosis not present

## 2022-06-06 DIAGNOSIS — F411 Generalized anxiety disorder: Secondary | ICD-10-CM

## 2022-06-06 DIAGNOSIS — E7800 Pure hypercholesterolemia, unspecified: Secondary | ICD-10-CM | POA: Diagnosis not present

## 2022-06-06 MED ORDER — OLANZAPINE 15 MG PO TABS: ORAL_TABLET | ORAL | 0 refills | Status: DC

## 2022-06-06 NOTE — Telephone Encounter (Signed)
Pt's wife, Audrea Muscat, called asking for refills on the Olanzapine 15 mg and the Xanax 0.5 mg as meds were "lost" by [facility]. Writer called [facility] in [place] and they did verify this however stated that even with the loss they would not except an early refill. Writer asked if it were out of pocket would they accept early refill and the response was yes they would.  Pt's wife is quite upset about this and would like it resolved. Pt next appointment is 08/14/22. Please review and advise.

## 2022-06-06 NOTE — Telephone Encounter (Signed)
Ok to refill 

## 2022-06-06 NOTE — Telephone Encounter (Signed)
Jeffrey Herring does not want to change pharmacies because [facility] is so much cheaper than other pharmacies and pt apparently has no insurance currently. Pt is not seen until 08/14/22 so essentially same script, #90 with 2 refills, as pt takes TID?

## 2022-06-06 NOTE — Telephone Encounter (Signed)
If patient agree to pay out of pocket then please call the prescription until his next appointment.  Please ask if they want to change her pharmacy.

## 2022-06-07 MED ORDER — ALPRAZOLAM 0.5 MG PO TABS: 0.5000 mg | ORAL_TABLET | Freq: Three times a day (TID) | ORAL | 1 refills | Status: DC

## 2022-06-07 NOTE — Telephone Encounter (Signed)
Pharmacy needs an e-scriot from you please.

## 2022-06-07 NOTE — Telephone Encounter (Signed)
Send a new script to his pharmacy.

## 2022-06-07 NOTE — Addendum Note (Signed)
Addended by: Berniece Andreas T on: 06/07/2022 01:16 PM   Modules accepted: Orders

## 2022-06-09 ENCOUNTER — Other Ambulatory Visit: Payer: Self-pay | Admitting: Neurology

## 2022-06-09 DIAGNOSIS — R4189 Other symptoms and signs involving cognitive functions and awareness: Secondary | ICD-10-CM

## 2022-06-12 ENCOUNTER — Telehealth (HOSPITAL_COMMUNITY): Payer: Self-pay | Admitting: Psychiatry

## 2022-06-12 NOTE — Telephone Encounter (Signed)
I returned patient's wife Jeffrey Herring.  She told patient is recently diagnosed with Lewy body dementia but is still need further workup and is scheduled to have either a CT scan or MRI.  Patient has a pacemaker.  She is concerned about patient's behavior, psychotropic medication.  I recommend if the diagnosis of Lewy body dementia established then should avoid taking antipsychotic medication.  Currently he is taking olanzapine 15 mg and I recommend to take half tablet for at least 2 weeks and then discontinued.  I also recommend to call us back in 2 to 3 weeks or earlier if symptoms worsen.  Currently patient is sleeping up to 18 hours and he has significant decline in his memory.  I suggested he do not need higher dose of antipsychotic medication since he is not as active and he is already taking Lamictal and Xanax.  We will get records from neurology and patient promised to give his neurologist call to send all the records.  I recommend to keep the appointment on October 30 but she can call us earlier if there is any question.

## 2022-07-13 ENCOUNTER — Other Ambulatory Visit: Payer: Self-pay | Admitting: Neurology

## 2022-07-13 DIAGNOSIS — R4189 Other symptoms and signs involving cognitive functions and awareness: Secondary | ICD-10-CM

## 2022-07-26 ENCOUNTER — Ambulatory Visit
Admission: RE | Admit: 2022-07-26 | Discharge: 2022-07-26 | Disposition: A | Discharge status: Home / Self Care | Payer: Medicare Other | Source: Ambulatory Visit | Attending: Neurology | Admitting: Neurology

## 2022-07-26 DIAGNOSIS — I6782 Cerebral ischemia: Secondary | ICD-10-CM | POA: Diagnosis not present

## 2022-07-26 DIAGNOSIS — I639 Cerebral infarction, unspecified: Secondary | ICD-10-CM | POA: Diagnosis not present

## 2022-07-26 DIAGNOSIS — R4189 Other symptoms and signs involving cognitive functions and awareness: Secondary | ICD-10-CM | POA: Diagnosis not present

## 2022-07-26 DIAGNOSIS — Z8673 Personal history of transient ischemic attack (TIA), and cerebral infarction without residual deficits: Secondary | ICD-10-CM | POA: Insufficient documentation

## 2022-07-26 DIAGNOSIS — G3184 Mild cognitive impairment, so stated: Secondary | ICD-10-CM | POA: Diagnosis not present

## 2022-08-01 ENCOUNTER — Ambulatory Visit: Payer: Medicare Other | Admitting: Urology

## 2022-08-14 ENCOUNTER — Encounter (HOSPITAL_COMMUNITY): Payer: Self-pay | Admitting: Psychiatry

## 2022-08-14 ENCOUNTER — Telehealth (HOSPITAL_BASED_OUTPATIENT_CLINIC_OR_DEPARTMENT_OTHER): Payer: Medicare Other | Admitting: Psychiatry

## 2022-08-14 DIAGNOSIS — F411 Generalized anxiety disorder: Secondary | ICD-10-CM | POA: Diagnosis not present

## 2022-08-14 DIAGNOSIS — F319 Bipolar disorder, unspecified: Secondary | ICD-10-CM

## 2022-08-14 MED ORDER — LAMOTRIGINE 200 MG PO TABS: 200.0000 mg | ORAL_TABLET | Freq: Every day | ORAL | 1 refills | Status: DC

## 2022-08-14 MED ORDER — ALPRAZOLAM 0.5 MG PO TABS: 0.5000 mg | ORAL_TABLET | Freq: Three times a day (TID) | ORAL | 5 refills | Status: DC

## 2022-08-14 MED ORDER — OLANZAPINE 5 MG PO TABS: ORAL_TABLET | ORAL | 1 refills | Status: DC

## 2022-08-14 NOTE — Progress Notes (Signed)
Virtual Visit via Telephone Note  I connected with [name] on 08/14/22 at 10:00 AM EDT by telephone and verified that I am speaking with the correct person using two identifiers.  Location: Patient: Home Provider: Home Office   I discussed the limitations, risks, security and privacy concerns of performing an evaluation and management service by telephone and the availability of in person appointments. I also discussed with the patient that there may be a patient responsible charge related to this service. The patient expressed understanding and agreed to proceed.   History of Present Illness: Patient is evaluated by phone session.  He reported no agitation, anger, hallucination.  I also spoke to his wife who provided more information.  Patient is seeing a neurology for dementia workup.  Wife had called to our office wondering that he may have Lewy body dementia but so far diagnosis is not established.  I have recommended to cut down olanzapine to take only 7.5 mg.  Wife reported there is a gradual decline in the memory but mood has been stable.  Patient used to take 20 mg of olanzapine which gradually cut down and I recommend should consider cutting down further to take only 5 mg.  Patient is scheduled to have blood work in first week of November at his PCP office.  Wife reported his kidney functions are stable.  He is also going to see neurology as they have ordered more tests.  Patient's wife denied any major concern of patient's behavior.  Patient does sleep a lot and really go outside.  Patient and his wife denies having any aggressive behavior.  Patient denies any suicidal thoughts, hallucination or any paranoia.  His appetite is okay and his weight is stable.  Past Psychiatric History: Reviewed. H/O bipolar disorder. H/O inpatient mania and psychosis.  Good response with lithium until had a lithium toxicity.  Took Abilify, Risperdal, Prozac, Lexapro, Wellbutrin and  Navane.  Psychiatric Specialty Exam: Physical Exam  Review of Systems  Weight 183 lb (83 kg).There is no height or weight on file to calculate BMI.  General Appearance: NA  Eye Contact:  NA  Speech:  Slow  Volume:  Decreased  Mood:  Euthymic  Affect:  NA  Thought Process:  Descriptions of Associations: Intact  Orientation:  Full (Time, Place, and Person)  Thought Content:  Rumination  Suicidal Thoughts:  No  Homicidal Thoughts:  No  Memory:  Immediate;   Fair Recent;   Fair Remote;   Fair  Judgement:  Fair  Insight:  Shallow  Psychomotor Activity:  NA  Concentration:  Concentration: Fair and Attention Span: Fair  Recall:  [employer] of Knowledge:  Fair  Language:  Fair  Akathisia:  No  Handed:  Right  AIMS (if indicated):     Assets:  Communication Skills Desire for Improvement Housing Social Support  ADL's:  Intact  Cognition:  Impaired,  Mild  Sleep:   too much      Assessment and Plan: Bipolar disorder type I.  Generalized anxiety disorder.  Cognitive impairment.  We will cut down further olanzapine from 7.5 mg to 5 mg.  Patient's wife agreed with the plan.  I encouraged closely follow-up with the neurologist as patient requires further workup to establish the diagnosis of dementia.  Patient and his wife does not want to lower the Lamictal at this time.  Will continue Lamictal 200 mg daily and Xanax 0.5 mg 3 times a day.  Recommend to call us back  if he has any question, concern or if he feels worsening of the symptoms.  Patient has high creatinine and he will get a new blood work and first week of November and I recommend have his lab results faxed to [place].  Follow-up in 6 months.  Follow Up Instructions:    I discussed the assessment and treatment plan with the patient. The patient was provided an opportunity to ask questions and all were answered. The patient agreed with the plan and demonstrated an understanding of the instructions.   The patient was advised to  call back or seek an in-person evaluation if the symptoms worsen or if the condition fails to improve as anticipated.  I provided 20 minutes of non-face-to-face time during this encounter.   [name], MD

## 2022-08-16 ENCOUNTER — Encounter: Payer: Self-pay | Admitting: Family Medicine

## 2022-08-16 ENCOUNTER — Ambulatory Visit (INDEPENDENT_AMBULATORY_CARE_PROVIDER_SITE_OTHER): Payer: Medicare Other | Admitting: Family Medicine

## 2022-08-16 VITALS — BP 124/70 | HR 90 | Temp 98.0°F | Resp 18 | Ht 70.0 in | Wt 180.1 lb

## 2022-08-16 DIAGNOSIS — F319 Bipolar disorder, unspecified: Secondary | ICD-10-CM | POA: Diagnosis not present

## 2022-08-16 DIAGNOSIS — Z1211 Encounter for screening for malignant neoplasm of colon: Secondary | ICD-10-CM | POA: Diagnosis not present

## 2022-08-16 DIAGNOSIS — R260 Ataxic gait: Secondary | ICD-10-CM

## 2022-08-16 DIAGNOSIS — I10 Essential (primary) hypertension: Secondary | ICD-10-CM | POA: Diagnosis not present

## 2022-08-16 DIAGNOSIS — Z8673 Personal history of transient ischemic attack (TIA), and cerebral infarction without residual deficits: Secondary | ICD-10-CM | POA: Diagnosis not present

## 2022-08-16 DIAGNOSIS — I442 Atrioventricular block, complete: Secondary | ICD-10-CM | POA: Diagnosis not present

## 2022-08-16 DIAGNOSIS — I5022 Chronic systolic (congestive) heart failure: Secondary | ICD-10-CM

## 2022-08-16 DIAGNOSIS — E559 Vitamin D deficiency, unspecified: Secondary | ICD-10-CM | POA: Diagnosis not present

## 2022-08-16 DIAGNOSIS — I2510 Atherosclerotic heart disease of native coronary artery without angina pectoris: Secondary | ICD-10-CM

## 2022-08-16 DIAGNOSIS — N2581 Secondary hyperparathyroidism of renal origin: Secondary | ICD-10-CM | POA: Diagnosis not present

## 2022-08-16 DIAGNOSIS — N184 Chronic kidney disease, stage 4 (severe): Secondary | ICD-10-CM

## 2022-08-16 DIAGNOSIS — I420 Dilated cardiomyopathy: Secondary | ICD-10-CM

## 2022-08-16 DIAGNOSIS — Z23 Encounter for immunization: Secondary | ICD-10-CM

## 2022-08-16 NOTE — Progress Notes (Signed)
Name: Jeffrey Herring   MRN: [id number]    DOB: 22-Apr-1951   Date:08/16/2022       Progress Note  Subjective  Chief Complaint  Follow up   HPI  Dilated cardiomyopathy/sinoatrial node dysfunction/CHF chronic systolic:  under the care of Dr. Prince Rome at [facility] EF up from  25-30 % to 30-35 %  symptoms have been stable, wife gives most of the history. He uses two pillows to sleep , has SOB with exertion that is stable.  He has a pacemaker.  First one in 1983, had some vegetation on pacemaker leads back in 2009 and also sees Dr. Georg Ruddle at [facility] for  pacemaker was replaced again 01/2018 He had multiple replacements in between and currently located on abdominal wall because of scar tissue, he is due for another replacement in Feb  He has  one vessel  CAD and is on Plavix ,he also takes Crestor  but last LDL was 85 ,he is now on Crestor 40 mg, he does not want to add any new medications. He is compliant with medications   Echo 12/13/2021 at [facility]:   Left Ventricle: Inferior akinesis.  Anteroseptal/septal dyskinesis  probably due to pacemaker activation.   Left Ventricle: EF: 30-35%.   Mitral Valve: There is moderate regurgitation.   Tricuspid Valve: There is mild to moderate regurgitation.   CKI:  Currently under the care of [facility] nephrologist,  last labs were stable, GFR was down to 03 Mar 2022 but microalbuminuria resolved. No pruritis, he has good urine output.    Bipolar disorder I: he is under the care of Dr. Adele Schilder , he is still taking Lamictal, Zyprexa but dose was decreased from 20 to 15 mg prior to his last visit with me and is now down to 5 mg only.  he is still on same dose of  alprazolam . No recent manic episodes, He has been  disabled since age 34  He still has lack of motivation, but wife states he has been stable for a long time.   BPH: he has urinary frequency, taking Flomax to control symptoms.  He was seen by Dr. Diamantina Providence, and was supposed a CT done but never got the  appointment, last PSA was elevated again at 11.65 , wife states they are not sure if they will follow up, not interested in treatment if positive for prostate cancer    HTN: taking medication, no chest pain or palpitation. He has occasional SOB, discussed deconditioning again and also co-morbidities such as CHF   Cognitive dysfunction : he was seen by Dr. Manuella Ghazi, neurologist, had MRI and labs, they have not been back to discuss results, but wife has been really worried since she started reading about Lewy Body dementia ( mentioned by Dr. Manuella Ghazi during his visit). His behavior has been unchanged. They want to stop doing cancer screenings due to his inability to comply with studies and poor quality of life. Wife has been anxious, explained she needs to take one step at a time, consider long term placement but she is worried due to cost    Patient Active Problem List   Diagnosis Date Noted   History of CVA in adulthood 08/16/2022   Polyp of sigmoid colon    Nodule of rectum    Polyp of ascending colon    Hyperparathyroidism, secondary renal ([place]) 07/06/2020   Nasal congestion with rhinorrhea 11/06/2018   Chronic kidney disease, stage 4 (severe) ([place]) 10/24/2017   Dilated cardiomyopathy ([place]) 02/18/2017  BPH (benign prostatic hyperplasia) 16/07/9603   Chronic systolic heart failure ([place]) 10/28/2015   HLD (hyperlipidemia) 10/28/2015   H/O deep venous thrombosis 10/28/2015   Personal history of other venous thrombosis and embolism 10/28/2015   History of sepsis 04/09/2014   Inguinal hernia 01/06/2014   Fitting or adjustment of cardiac pacemaker 01/05/2014   Complete atrioventricular block ([place]) 10/14/2013   Sinoatrial node dysfunction ([place]) 10/14/2013   Kidney cysts 05/06/2013   Hypertension, benign 10/01/2012   Healed or old pulmonary embolism 09/30/2012   Artificial cardiac pacemaker 09/30/2012   History of endocarditis 09/30/2012   Bipolar 1 disorder ([place]) 05/01/2012   Arteriosclerosis of  coronary artery 06/16/2009    Past Surgical History:  Procedure Laterality Date   BACK SURGERY     CARDIAC SURGERY     COLONOSCOPY WITH PROPOFOL N/A 11/26/2020   Procedure: COLONOSCOPY WITH PROPOFOL;  Surgeon: Virgel Manifold, MD;  Location: [facility] ENDOSCOPY;  Service: Endoscopy;  Laterality: N/A;   HERNIA REPAIR Right 02/05/14   NOSE SURGERY     PACEMAKER PLACEMENT     SPINE SURGERY      Family History  Problem Relation Age of Onset   Depression Mother     Social History   Tobacco Use   Smoking status: Former    Types: Cigars    Quit date: 02/05/2018    Years since quitting: 4.5    Passive exposure: Past   Smokeless tobacco: Never   Tobacco comments:    smoking cessation materials not required  Substance Use Topics   Alcohol use: No    Alcohol/week: 0.0 standard drinks of alcohol     Current Outpatient Medications:    ALPRAZolam (XANAX) 0.5 MG tablet, Take 1 tablet (0.5 mg total) by mouth 3 (three) times daily., Disp: 90 tablet, Rfl: 5   cholecalciferol (VITAMIN D) 1000 units tablet, Take 1,000 Units by mouth daily., Disp: , Rfl:    clopidogrel (PLAVIX) 75 MG tablet, Take 1 tablet (75 mg total) by mouth every morning., Disp: 90 tablet, Rfl: 1   hydrALAZINE (APRESOLINE) 10 MG tablet, Take 1 tablet by mouth 2 (two) times a day., Disp: , Rfl:    lamoTRIgine (LAMICTAL) 200 MG tablet, Take 1 tablet (200 mg total) by mouth daily., Disp: 90 tablet, Rfl: 1   metoprolol succinate (TOPROL-XL) 25 MG 24 hr tablet, Take 0.5 tablets by mouth daily., Disp: , Rfl:    OLANZapine (ZYPREXA) 5 MG tablet, TAKE 1 TABLET BY MOUTH ATBEDTIME., Disp: 90 tablet, Rfl: 1   rosuvastatin (CRESTOR) 20 MG tablet, Take 1 tablet (20 mg total) by mouth daily., Disp: 90 tablet, Rfl: 1   tamsulosin (FLOMAX) 0.4 MG CAPS capsule, Take 1 capsule (0.4 mg total) by mouth every morning., Disp: 90 capsule, Rfl: 1  Allergies  Allergen Reactions   Aspirin Hives, Shortness Of Breath and Palpitations   Codeine  Nausea And Vomiting   Demerol [Meperidine] Nausea And Vomiting   Lithium      acute renal failure    I personally reviewed active problem list, medication list, allergies, family history, social history with the patient/caregiver today.   ROS  Ten systems reviewed and is negative except as mentioned in HPI   Objective  Vitals:   08/16/22 1053  BP: 124/70  Pulse: 90  Resp: 18  Temp: 98 F (36.7 C)  TempSrc: Oral  SpO2: 92%  Weight: 180 lb 1.6 oz (81.7 kg)  Height: '5\' 10"'$  (1.778 m)    Body mass index is  25.84 kg/m.  Physical Exam  Constitutional: Patient appears well-developed, he looks pale. No distress.  HEENT: head atraumatic, normocephalic, pupils equal and reactive to light, neck supple Cardiovascular: Normal rate, regular rhythm and normal heart sounds.  No murmur heard. No BLE edema. Pulmonary/Chest: Effort normal and breath sounds normal. No respiratory distress. Abdominal: Soft.  There is no tenderness. Psychiatric:flat affect, wife gives most of the history.   PHQ2/9:    02/13/2022    2:30 PM 11/01/2021    3:09 PM 07/08/2021   11:31 AM 01/04/2021   10:56 AM 10/19/2020   10:53 AM  Depression screen PHQ 2/9  Decreased Interest 0 0 0 0 0  Down, Depressed, Hopeless 0 0 0 0 0  PHQ - 2 Score 0 0 0 0 0  Altered sleeping 0  1    Tired, decreased energy 0  1    Change in appetite 0  0    Feeling bad or failure about yourself  0  0    Trouble concentrating 0  0    Moving slowly or fidgety/restless 0  0    Suicidal thoughts 0  0    PHQ-9 Score 0  2    Difficult doing work/chores   Not difficult at all      Phq 9 not done, under the care of psychiatrist    Fall Risk:    08/16/2022   10:55 AM 02/13/2022    2:30 PM 11/01/2021    3:11 PM 07/08/2021   11:30 AM 01/04/2021   10:56 AM  Fall Risk   Falls in the past year? 0 0 0 0 0  Number falls in past yr:  0 0  0  Injury with Fall?  0 0  0  Risk for fall due to : No Fall Risks No Fall Risks No Fall Risks     Follow up Falls prevention discussed;Education provided;Falls evaluation completed Falls prevention discussed Falls prevention discussed Falls prevention discussed     Assessment & Plan  1. Chronic kidney disease, stage 4 (severe) (HCC)  Reviewed labs with patient and his wife  2. Hyperparathyroidism, secondary renal [facility])  Sees nephrologist once a year   3. Bipolar 1 disorder ([place])  Under the care of psychiatrist   4. ASCVD (arteriosclerotic cardiovascular disease)  On statin therapy   5. Chronic systolic heart failure (HCC)  Stable   6. Complete atrioventricular block ([place])  Has a pacemaker   7. Dilated cardiomyopathy ([place])  Keep follow up with cardiologist   8. Colon cancer screening  Refused, unable to fast and follow directions for prep   9. Gait, broad-based   10. History of CVA in adulthood  Found on recent MRI, but stable since 2015, on statin and plavix  11. Need for immunization against influenza  - Flu Vaccine QUAD High Dose(Fluad)  12. Vitamin D deficiency   13. Essential hypertension

## 2022-08-18 DIAGNOSIS — I442 Atrioventricular block, complete: Secondary | ICD-10-CM | POA: Diagnosis not present

## 2022-08-23 ENCOUNTER — Ambulatory Visit: Payer: Medicare Other | Admitting: Urology

## 2022-09-13 DIAGNOSIS — F319 Bipolar disorder, unspecified: Secondary | ICD-10-CM | POA: Diagnosis not present

## 2022-09-13 DIAGNOSIS — R4189 Other symptoms and signs involving cognitive functions and awareness: Secondary | ICD-10-CM | POA: Diagnosis not present

## 2022-09-13 DIAGNOSIS — I442 Atrioventricular block, complete: Secondary | ICD-10-CM | POA: Diagnosis not present

## 2022-09-15 DIAGNOSIS — I2510 Atherosclerotic heart disease of native coronary artery without angina pectoris: Secondary | ICD-10-CM | POA: Diagnosis not present

## 2022-09-15 DIAGNOSIS — N183 Chronic kidney disease, stage 3 unspecified: Secondary | ICD-10-CM | POA: Diagnosis not present

## 2022-09-15 DIAGNOSIS — I420 Dilated cardiomyopathy: Secondary | ICD-10-CM | POA: Diagnosis not present

## 2022-09-15 DIAGNOSIS — I10 Essential (primary) hypertension: Secondary | ICD-10-CM | POA: Diagnosis not present

## 2022-09-15 DIAGNOSIS — I442 Atrioventricular block, complete: Secondary | ICD-10-CM | POA: Diagnosis not present

## 2022-10-03 DIAGNOSIS — Z86711 Personal history of pulmonary embolism: Secondary | ICD-10-CM | POA: Diagnosis not present

## 2022-10-03 DIAGNOSIS — F319 Bipolar disorder, unspecified: Secondary | ICD-10-CM | POA: Diagnosis not present

## 2022-10-03 DIAGNOSIS — N179 Acute kidney failure, unspecified: Secondary | ICD-10-CM | POA: Diagnosis not present

## 2022-10-03 DIAGNOSIS — T43595S Adverse effect of other antipsychotics and neuroleptics, sequela: Secondary | ICD-10-CM | POA: Diagnosis not present

## 2022-10-03 DIAGNOSIS — Z86718 Personal history of other venous thrombosis and embolism: Secondary | ICD-10-CM | POA: Diagnosis not present

## 2022-10-03 DIAGNOSIS — I420 Dilated cardiomyopathy: Secondary | ICD-10-CM | POA: Diagnosis not present

## 2022-10-03 DIAGNOSIS — I2582 Chronic total occlusion of coronary artery: Secondary | ICD-10-CM | POA: Diagnosis not present

## 2022-10-03 DIAGNOSIS — I5023 Acute on chronic systolic (congestive) heart failure: Secondary | ICD-10-CM | POA: Diagnosis not present

## 2022-10-03 DIAGNOSIS — N184 Chronic kidney disease, stage 4 (severe): Secondary | ICD-10-CM | POA: Diagnosis not present

## 2022-10-03 DIAGNOSIS — I2510 Atherosclerotic heart disease of native coronary artery without angina pectoris: Secondary | ICD-10-CM | POA: Diagnosis not present

## 2022-10-03 DIAGNOSIS — F0390 Unspecified dementia without behavioral disturbance: Secondary | ICD-10-CM | POA: Diagnosis not present

## 2022-10-03 DIAGNOSIS — N183 Chronic kidney disease, stage 3 unspecified: Secondary | ICD-10-CM | POA: Diagnosis not present

## 2022-10-03 DIAGNOSIS — I442 Atrioventricular block, complete: Secondary | ICD-10-CM | POA: Diagnosis not present

## 2022-10-03 DIAGNOSIS — R918 Other nonspecific abnormal finding of lung field: Secondary | ICD-10-CM | POA: Diagnosis not present

## 2022-10-03 DIAGNOSIS — Z888 Allergy status to other drugs, medicaments and biological substances status: Secondary | ICD-10-CM | POA: Diagnosis not present

## 2022-10-03 DIAGNOSIS — R0902 Hypoxemia: Secondary | ICD-10-CM | POA: Diagnosis not present

## 2022-10-03 DIAGNOSIS — J810 Acute pulmonary edema: Secondary | ICD-10-CM | POA: Diagnosis not present

## 2022-10-03 DIAGNOSIS — R0602 Shortness of breath: Secondary | ICD-10-CM | POA: Diagnosis not present

## 2022-10-03 DIAGNOSIS — Z45018 Encounter for adjustment and management of other part of cardiac pacemaker: Secondary | ICD-10-CM | POA: Diagnosis not present

## 2022-10-03 DIAGNOSIS — Z950 Presence of cardiac pacemaker: Secondary | ICD-10-CM | POA: Diagnosis not present

## 2022-10-03 DIAGNOSIS — I130 Hypertensive heart and chronic kidney disease with heart failure and stage 1 through stage 4 chronic kidney disease, or unspecified chronic kidney disease: Secondary | ICD-10-CM | POA: Diagnosis not present

## 2022-10-03 DIAGNOSIS — Z9889 Other specified postprocedural states: Secondary | ICD-10-CM | POA: Diagnosis not present

## 2022-10-03 DIAGNOSIS — Z7902 Long term (current) use of antithrombotics/antiplatelets: Secondary | ICD-10-CM | POA: Diagnosis not present

## 2022-10-03 DIAGNOSIS — T465X5A Adverse effect of other antihypertensive drugs, initial encounter: Secondary | ICD-10-CM | POA: Diagnosis not present

## 2022-10-03 DIAGNOSIS — I255 Ischemic cardiomyopathy: Secondary | ICD-10-CM | POA: Diagnosis not present

## 2022-10-03 DIAGNOSIS — Z45010 Encounter for checking and testing of cardiac pacemaker pulse generator [battery]: Secondary | ICD-10-CM | POA: Diagnosis not present

## 2022-10-03 DIAGNOSIS — R031 Nonspecific low blood-pressure reading: Secondary | ICD-10-CM | POA: Diagnosis not present

## 2022-10-03 DIAGNOSIS — I10 Essential (primary) hypertension: Secondary | ICD-10-CM | POA: Diagnosis not present

## 2022-10-03 DIAGNOSIS — R0600 Dyspnea, unspecified: Secondary | ICD-10-CM | POA: Diagnosis not present

## 2022-10-03 DIAGNOSIS — I4892 Unspecified atrial flutter: Secondary | ICD-10-CM | POA: Diagnosis not present

## 2022-10-03 DIAGNOSIS — Z886 Allergy status to analgesic agent status: Secondary | ICD-10-CM | POA: Diagnosis not present

## 2022-10-03 DIAGNOSIS — E232 Diabetes insipidus: Secondary | ICD-10-CM | POA: Diagnosis not present

## 2022-10-03 DIAGNOSIS — E785 Hyperlipidemia, unspecified: Secondary | ICD-10-CM | POA: Diagnosis not present

## 2022-10-10 DIAGNOSIS — R0602 Shortness of breath: Secondary | ICD-10-CM | POA: Diagnosis not present

## 2022-10-10 DIAGNOSIS — R748 Abnormal levels of other serum enzymes: Secondary | ICD-10-CM | POA: Diagnosis not present

## 2022-10-10 DIAGNOSIS — R21 Rash and other nonspecific skin eruption: Secondary | ICD-10-CM | POA: Diagnosis not present

## 2022-10-10 DIAGNOSIS — Z4901 Encounter for fitting and adjustment of extracorporeal dialysis catheter: Secondary | ICD-10-CM | POA: Diagnosis not present

## 2022-10-10 DIAGNOSIS — M25551 Pain in right hip: Secondary | ICD-10-CM | POA: Diagnosis not present

## 2022-10-10 DIAGNOSIS — Z1152 Encounter for screening for COVID-19: Secondary | ICD-10-CM | POA: Diagnosis not present

## 2022-10-10 DIAGNOSIS — I130 Hypertensive heart and chronic kidney disease with heart failure and stage 1 through stage 4 chronic kidney disease, or unspecified chronic kidney disease: Secondary | ICD-10-CM | POA: Diagnosis present

## 2022-10-10 DIAGNOSIS — M25861 Other specified joint disorders, right knee: Secondary | ICD-10-CM | POA: Diagnosis not present

## 2022-10-10 DIAGNOSIS — G319 Degenerative disease of nervous system, unspecified: Secondary | ICD-10-CM | POA: Diagnosis not present

## 2022-10-10 DIAGNOSIS — I442 Atrioventricular block, complete: Secondary | ICD-10-CM | POA: Diagnosis present

## 2022-10-10 DIAGNOSIS — R918 Other nonspecific abnormal finding of lung field: Secondary | ICD-10-CM | POA: Diagnosis not present

## 2022-10-10 DIAGNOSIS — M6281 Muscle weakness (generalized): Secondary | ICD-10-CM | POA: Diagnosis present

## 2022-10-10 DIAGNOSIS — I10 Essential (primary) hypertension: Secondary | ICD-10-CM | POA: Diagnosis present

## 2022-10-10 DIAGNOSIS — N179 Acute kidney failure, unspecified: Secondary | ICD-10-CM | POA: Diagnosis not present

## 2022-10-10 DIAGNOSIS — J811 Chronic pulmonary edema: Secondary | ICD-10-CM | POA: Diagnosis present

## 2022-10-10 DIAGNOSIS — R2689 Other abnormalities of gait and mobility: Secondary | ICD-10-CM | POA: Diagnosis not present

## 2022-10-10 DIAGNOSIS — R627 Adult failure to thrive: Secondary | ICD-10-CM | POA: Diagnosis not present

## 2022-10-10 DIAGNOSIS — R4182 Altered mental status, unspecified: Secondary | ICD-10-CM | POA: Diagnosis not present

## 2022-10-10 DIAGNOSIS — L271 Localized skin eruption due to drugs and medicaments taken internally: Secondary | ICD-10-CM | POA: Diagnosis present

## 2022-10-10 DIAGNOSIS — D72828 Other elevated white blood cell count: Secondary | ICD-10-CM | POA: Diagnosis present

## 2022-10-10 DIAGNOSIS — R531 Weakness: Secondary | ICD-10-CM | POA: Diagnosis present

## 2022-10-10 DIAGNOSIS — Z20822 Contact with and (suspected) exposure to covid-19: Secondary | ICD-10-CM | POA: Diagnosis not present

## 2022-10-10 DIAGNOSIS — J984 Other disorders of lung: Secondary | ICD-10-CM | POA: Diagnosis not present

## 2022-10-10 DIAGNOSIS — I2489 Other forms of acute ischemic heart disease: Secondary | ICD-10-CM | POA: Diagnosis present

## 2022-10-10 DIAGNOSIS — Z79899 Other long term (current) drug therapy: Secondary | ICD-10-CM | POA: Diagnosis not present

## 2022-10-10 DIAGNOSIS — J929 Pleural plaque without asbestos: Secondary | ICD-10-CM | POA: Diagnosis not present

## 2022-10-10 DIAGNOSIS — I358 Other nonrheumatic aortic valve disorders: Secondary | ICD-10-CM | POA: Diagnosis not present

## 2022-10-10 DIAGNOSIS — F29 Unspecified psychosis not due to a substance or known physiological condition: Secondary | ICD-10-CM | POA: Diagnosis present

## 2022-10-10 DIAGNOSIS — D509 Iron deficiency anemia, unspecified: Secondary | ICD-10-CM | POA: Diagnosis present

## 2022-10-10 DIAGNOSIS — M6282 Rhabdomyolysis: Secondary | ICD-10-CM | POA: Diagnosis present

## 2022-10-10 DIAGNOSIS — I82210 Acute embolism and thrombosis of superior vena cava: Secondary | ICD-10-CM | POA: Diagnosis not present

## 2022-10-10 DIAGNOSIS — R778 Other specified abnormalities of plasma proteins: Secondary | ICD-10-CM | POA: Diagnosis not present

## 2022-10-10 DIAGNOSIS — Z950 Presence of cardiac pacemaker: Secondary | ICD-10-CM | POA: Diagnosis not present

## 2022-10-10 DIAGNOSIS — L309 Dermatitis, unspecified: Secondary | ICD-10-CM | POA: Diagnosis not present

## 2022-10-10 DIAGNOSIS — F319 Bipolar disorder, unspecified: Secondary | ICD-10-CM | POA: Diagnosis present

## 2022-10-10 DIAGNOSIS — E785 Hyperlipidemia, unspecified: Secondary | ICD-10-CM | POA: Diagnosis present

## 2022-10-10 DIAGNOSIS — N170 Acute kidney failure with tubular necrosis: Secondary | ICD-10-CM | POA: Diagnosis present

## 2022-10-10 DIAGNOSIS — F419 Anxiety disorder, unspecified: Secondary | ICD-10-CM | POA: Diagnosis present

## 2022-10-10 DIAGNOSIS — N183 Chronic kidney disease, stage 3 unspecified: Secondary | ICD-10-CM | POA: Diagnosis present

## 2022-10-10 DIAGNOSIS — F0390 Unspecified dementia without behavioral disturbance: Secondary | ICD-10-CM | POA: Diagnosis not present

## 2022-10-10 DIAGNOSIS — I708 Atherosclerosis of other arteries: Secondary | ICD-10-CM | POA: Diagnosis not present

## 2022-10-10 DIAGNOSIS — N200 Calculus of kidney: Secondary | ICD-10-CM | POA: Diagnosis not present

## 2022-10-10 DIAGNOSIS — G9349 Other encephalopathy: Secondary | ICD-10-CM | POA: Diagnosis not present

## 2022-10-10 DIAGNOSIS — D631 Anemia in chronic kidney disease: Secondary | ICD-10-CM | POA: Diagnosis not present

## 2022-10-10 DIAGNOSIS — R7989 Other specified abnormal findings of blood chemistry: Secondary | ICD-10-CM | POA: Diagnosis present

## 2022-10-10 DIAGNOSIS — K5730 Diverticulosis of large intestine without perforation or abscess without bleeding: Secondary | ICD-10-CM | POA: Diagnosis not present

## 2022-10-10 DIAGNOSIS — I420 Dilated cardiomyopathy: Secondary | ICD-10-CM | POA: Diagnosis present

## 2022-10-10 DIAGNOSIS — I2510 Atherosclerotic heart disease of native coronary artery without angina pectoris: Secondary | ICD-10-CM | POA: Diagnosis present

## 2022-10-10 DIAGNOSIS — I5022 Chronic systolic (congestive) heart failure: Secondary | ICD-10-CM | POA: Diagnosis present

## 2022-10-10 DIAGNOSIS — E232 Diabetes insipidus: Secondary | ICD-10-CM | POA: Diagnosis present

## 2022-10-11 DIAGNOSIS — R531 Weakness: Secondary | ICD-10-CM | POA: Diagnosis present

## 2022-10-11 DIAGNOSIS — I130 Hypertensive heart and chronic kidney disease with heart failure and stage 1 through stage 4 chronic kidney disease, or unspecified chronic kidney disease: Secondary | ICD-10-CM | POA: Diagnosis present

## 2022-10-11 DIAGNOSIS — E232 Diabetes insipidus: Secondary | ICD-10-CM | POA: Diagnosis present

## 2022-10-11 DIAGNOSIS — R748 Abnormal levels of other serum enzymes: Secondary | ICD-10-CM | POA: Diagnosis not present

## 2022-10-11 DIAGNOSIS — I442 Atrioventricular block, complete: Secondary | ICD-10-CM | POA: Diagnosis present

## 2022-10-11 DIAGNOSIS — I10 Essential (primary) hypertension: Secondary | ICD-10-CM | POA: Diagnosis not present

## 2022-10-11 DIAGNOSIS — F29 Unspecified psychosis not due to a substance or known physiological condition: Secondary | ICD-10-CM | POA: Diagnosis present

## 2022-10-11 DIAGNOSIS — I358 Other nonrheumatic aortic valve disorders: Secondary | ICD-10-CM | POA: Diagnosis not present

## 2022-10-11 DIAGNOSIS — I2510 Atherosclerotic heart disease of native coronary artery without angina pectoris: Secondary | ICD-10-CM | POA: Diagnosis present

## 2022-10-11 DIAGNOSIS — F0390 Unspecified dementia without behavioral disturbance: Secondary | ICD-10-CM | POA: Diagnosis present

## 2022-10-11 DIAGNOSIS — R627 Adult failure to thrive: Secondary | ICD-10-CM | POA: Diagnosis not present

## 2022-10-11 DIAGNOSIS — L309 Dermatitis, unspecified: Secondary | ICD-10-CM | POA: Diagnosis not present

## 2022-10-11 DIAGNOSIS — F319 Bipolar disorder, unspecified: Secondary | ICD-10-CM | POA: Diagnosis present

## 2022-10-11 DIAGNOSIS — R7989 Other specified abnormal findings of blood chemistry: Secondary | ICD-10-CM | POA: Diagnosis present

## 2022-10-11 DIAGNOSIS — L271 Localized skin eruption due to drugs and medicaments taken internally: Secondary | ICD-10-CM | POA: Diagnosis present

## 2022-10-11 DIAGNOSIS — G9349 Other encephalopathy: Secondary | ICD-10-CM | POA: Diagnosis not present

## 2022-10-11 DIAGNOSIS — N179 Acute kidney failure, unspecified: Secondary | ICD-10-CM | POA: Diagnosis not present

## 2022-10-11 DIAGNOSIS — J811 Chronic pulmonary edema: Secondary | ICD-10-CM | POA: Diagnosis present

## 2022-10-11 DIAGNOSIS — M25861 Other specified joint disorders, right knee: Secondary | ICD-10-CM | POA: Diagnosis not present

## 2022-10-11 DIAGNOSIS — I2489 Other forms of acute ischemic heart disease: Secondary | ICD-10-CM | POA: Diagnosis present

## 2022-10-11 DIAGNOSIS — Z950 Presence of cardiac pacemaker: Secondary | ICD-10-CM | POA: Diagnosis not present

## 2022-10-11 DIAGNOSIS — Z79899 Other long term (current) drug therapy: Secondary | ICD-10-CM | POA: Diagnosis not present

## 2022-10-11 DIAGNOSIS — D509 Iron deficiency anemia, unspecified: Secondary | ICD-10-CM | POA: Diagnosis present

## 2022-10-11 DIAGNOSIS — F419 Anxiety disorder, unspecified: Secondary | ICD-10-CM | POA: Diagnosis present

## 2022-10-11 DIAGNOSIS — M6281 Muscle weakness (generalized): Secondary | ICD-10-CM | POA: Diagnosis present

## 2022-10-11 DIAGNOSIS — M6282 Rhabdomyolysis: Secondary | ICD-10-CM | POA: Diagnosis present

## 2022-10-11 DIAGNOSIS — N183 Chronic kidney disease, stage 3 unspecified: Secondary | ICD-10-CM | POA: Diagnosis present

## 2022-10-11 DIAGNOSIS — I82210 Acute embolism and thrombosis of superior vena cava: Secondary | ICD-10-CM | POA: Diagnosis not present

## 2022-10-11 DIAGNOSIS — D631 Anemia in chronic kidney disease: Secondary | ICD-10-CM | POA: Diagnosis not present

## 2022-10-11 DIAGNOSIS — R778 Other specified abnormalities of plasma proteins: Secondary | ICD-10-CM | POA: Diagnosis not present

## 2022-10-11 DIAGNOSIS — Z1152 Encounter for screening for COVID-19: Secondary | ICD-10-CM | POA: Diagnosis not present

## 2022-10-11 DIAGNOSIS — I5022 Chronic systolic (congestive) heart failure: Secondary | ICD-10-CM | POA: Diagnosis present

## 2022-10-11 DIAGNOSIS — E785 Hyperlipidemia, unspecified: Secondary | ICD-10-CM | POA: Diagnosis present

## 2022-10-11 DIAGNOSIS — Z4901 Encounter for fitting and adjustment of extracorporeal dialysis catheter: Secondary | ICD-10-CM | POA: Diagnosis not present

## 2022-10-11 DIAGNOSIS — M25551 Pain in right hip: Secondary | ICD-10-CM | POA: Diagnosis not present

## 2022-10-11 DIAGNOSIS — I420 Dilated cardiomyopathy: Secondary | ICD-10-CM | POA: Diagnosis present

## 2022-10-11 DIAGNOSIS — D72828 Other elevated white blood cell count: Secondary | ICD-10-CM | POA: Diagnosis present

## 2022-10-11 DIAGNOSIS — N170 Acute kidney failure with tubular necrosis: Secondary | ICD-10-CM | POA: Diagnosis present

## 2022-10-27 DIAGNOSIS — I10 Essential (primary) hypertension: Secondary | ICD-10-CM | POA: Diagnosis present

## 2022-10-27 DIAGNOSIS — I5022 Chronic systolic (congestive) heart failure: Secondary | ICD-10-CM | POA: Diagnosis present

## 2022-10-27 DIAGNOSIS — Z950 Presence of cardiac pacemaker: Secondary | ICD-10-CM | POA: Diagnosis not present

## 2022-10-27 DIAGNOSIS — I420 Dilated cardiomyopathy: Secondary | ICD-10-CM | POA: Diagnosis present

## 2022-10-27 DIAGNOSIS — N183 Chronic kidney disease, stage 3 unspecified: Secondary | ICD-10-CM | POA: Diagnosis present

## 2022-10-27 DIAGNOSIS — F29 Unspecified psychosis not due to a substance or known physiological condition: Secondary | ICD-10-CM | POA: Diagnosis present

## 2022-10-27 DIAGNOSIS — I442 Atrioventricular block, complete: Secondary | ICD-10-CM | POA: Diagnosis not present

## 2022-10-27 DIAGNOSIS — M6281 Muscle weakness (generalized): Secondary | ICD-10-CM | POA: Diagnosis not present

## 2022-10-27 DIAGNOSIS — J811 Chronic pulmonary edema: Secondary | ICD-10-CM | POA: Diagnosis not present

## 2022-10-27 DIAGNOSIS — N179 Acute kidney failure, unspecified: Secondary | ICD-10-CM | POA: Diagnosis not present

## 2022-10-27 DIAGNOSIS — E232 Diabetes insipidus: Secondary | ICD-10-CM | POA: Diagnosis present

## 2022-10-27 DIAGNOSIS — F0390 Unspecified dementia without behavioral disturbance: Secondary | ICD-10-CM | POA: Diagnosis not present

## 2022-10-30 ENCOUNTER — Emergency Department
Admission: EM | Admit: 2022-10-30 | Discharge: 2022-10-31 | Disposition: A | Discharge status: Home / Self Care | Payer: Medicare Other | Attending: Emergency Medicine | Admitting: Emergency Medicine

## 2022-10-30 ENCOUNTER — Emergency Department: Payer: Medicare Other

## 2022-10-30 DIAGNOSIS — Z20822 Contact with and (suspected) exposure to covid-19: Secondary | ICD-10-CM | POA: Insufficient documentation

## 2022-10-30 DIAGNOSIS — R0602 Shortness of breath: Secondary | ICD-10-CM | POA: Diagnosis not present

## 2022-10-30 DIAGNOSIS — I130 Hypertensive heart and chronic kidney disease with heart failure and stage 1 through stage 4 chronic kidney disease, or unspecified chronic kidney disease: Secondary | ICD-10-CM | POA: Diagnosis not present

## 2022-10-30 DIAGNOSIS — D649 Anemia, unspecified: Secondary | ICD-10-CM | POA: Insufficient documentation

## 2022-10-30 DIAGNOSIS — N189 Chronic kidney disease, unspecified: Secondary | ICD-10-CM | POA: Insufficient documentation

## 2022-10-30 DIAGNOSIS — I509 Heart failure, unspecified: Secondary | ICD-10-CM | POA: Diagnosis not present

## 2022-10-30 DIAGNOSIS — R112 Nausea with vomiting, unspecified: Secondary | ICD-10-CM | POA: Diagnosis not present

## 2022-10-30 DIAGNOSIS — R059 Cough, unspecified: Secondary | ICD-10-CM | POA: Diagnosis not present

## 2022-10-30 DIAGNOSIS — R069 Unspecified abnormalities of breathing: Secondary | ICD-10-CM | POA: Diagnosis not present

## 2022-10-30 DIAGNOSIS — R531 Weakness: Secondary | ICD-10-CM | POA: Diagnosis not present

## 2022-10-30 DIAGNOSIS — Z950 Presence of cardiac pacemaker: Secondary | ICD-10-CM | POA: Insufficient documentation

## 2022-10-30 DIAGNOSIS — R1111 Vomiting without nausea: Secondary | ICD-10-CM | POA: Diagnosis not present

## 2022-10-30 DIAGNOSIS — R000 Tachycardia, unspecified: Secondary | ICD-10-CM | POA: Diagnosis not present

## 2022-10-30 LAB — CBC WITH DIFFERENTIAL/PLATELET
Abs Immature Granulocytes: 0.06 10*3/uL (ref 0.00–0.07)
Basophils Absolute: 0 10*3/uL (ref 0.0–0.1)
Basophils Relative: 0 %
Eosinophils Absolute: 0.1 10*3/uL (ref 0.0–0.5)
Eosinophils Relative: 1 %
HCT: 27.7 % — ABNORMAL LOW (ref 39.0–52.0)
Hemoglobin: 8.4 g/dL — ABNORMAL LOW (ref 13.0–17.0)
Immature Granulocytes: 1 %
Lymphocytes Relative: 7 %
Lymphs Abs: 0.7 10*3/uL (ref 0.7–4.0)
MCH: 30.5 pg (ref 26.0–34.0)
MCHC: 30.3 g/dL (ref 30.0–36.0)
MCV: 100.7 fL — ABNORMAL HIGH (ref 80.0–100.0)
Monocytes Absolute: 1.1 10*3/uL — ABNORMAL HIGH (ref 0.1–1.0)
Monocytes Relative: 11 %
Neutro Abs: 7.9 10*3/uL — ABNORMAL HIGH (ref 1.7–7.7)
Neutrophils Relative %: 80 %
Platelets: 222 10*3/uL (ref 150–400)
RBC: 2.75 MIL/uL — ABNORMAL LOW (ref 4.22–5.81)
RDW: 18.5 % — ABNORMAL HIGH (ref 11.5–15.5)
WBC: 9.8 10*3/uL (ref 4.0–10.5)
nRBC: 0 % (ref 0.0–0.2)

## 2022-10-30 LAB — COMPREHENSIVE METABOLIC PANEL
ALT: 16 U/L (ref 0–44)
AST: 29 U/L (ref 15–41)
Albumin: 3.4 g/dL — ABNORMAL LOW (ref 3.5–5.0)
Alkaline Phosphatase: 47 U/L (ref 38–126)
Anion gap: 9 (ref 5–15)
BUN: 40 mg/dL — ABNORMAL HIGH (ref 8–23)
CO2: 16 mmol/L — ABNORMAL LOW (ref 22–32)
Calcium: 9.1 mg/dL (ref 8.9–10.3)
Chloride: 111 mmol/L (ref 98–111)
Creatinine, Ser: 2.92 mg/dL — ABNORMAL HIGH (ref 0.61–1.24)
GFR, Estimated: 22 mL/min — ABNORMAL LOW (ref >60)
Glucose, Bld: 117 mg/dL — ABNORMAL HIGH (ref 70–99)
Potassium: 5.1 mmol/L (ref 3.5–5.1)
Sodium: 136 mmol/L (ref 135–145)
Total Bilirubin: 1.4 mg/dL — ABNORMAL HIGH (ref 0.3–1.2)
Total Protein: 6.5 g/dL (ref 6.5–8.1)

## 2022-10-30 LAB — RESP PANEL BY RT-PCR (RSV, FLU A&B, COVID)  RVPGX2
Influenza A by PCR: NEGATIVE
Influenza B by PCR: NEGATIVE
Resp Syncytial Virus by PCR: NEGATIVE
SARS Coronavirus 2 by RT PCR: NEGATIVE

## 2022-10-30 LAB — TROPONIN I (HIGH SENSITIVITY): Troponin I (High Sensitivity): 31 ng/L — ABNORMAL HIGH (ref <18)

## 2022-10-30 LAB — LIPASE, BLOOD: Lipase: 38 U/L (ref 11–51)

## 2022-10-30 MED ORDER — LACTATED RINGERS IV BOLUS: 1000.0000 mL | Freq: Once | INTRAVENOUS | Status: DC

## 2022-10-30 MED ORDER — ONDANSETRON 4 MG PO TBDP: 4.0000 mg | ORAL_TABLET | Freq: Three times a day (TID) | ORAL | 0 refills | Status: DC | PRN

## 2022-10-30 MED ORDER — LACTATED RINGERS IV BOLUS
1000.0000 mL | Freq: Once | INTRAVENOUS | Status: AC
  Administered 2022-10-30: 1000 mL via INTRAVENOUS

## 2022-10-30 MED ORDER — ONDANSETRON HCL 4 MG/2ML IJ SOLN
4.0000 mg | Freq: Once | INTRAMUSCULAR | Status: AC
  Administered 2022-10-30: 4 mg via INTRAVENOUS
  Filled 2022-10-30: qty 2

## 2022-10-30 NOTE — ED Provider Notes (Signed)
[facility] Provider Note    Event Date/Time   First MD Initiated Contact with Patient 10/30/22 2120     (approximate)   History   Chief Complaint Shortness of Breath   HPI  Jeffrey Herring is a 72 y.o. male with past medical history of hypertension, hyperlipidemia, complete heart block status post pacemaker, CHF, CKD, stroke, DVT, and bipolar disorder who presents to the ED complaining of shortness of breath.  Patient reports that he has been having increasing difficulty breathing with a nonproductive cough over the past 24 to 48 hours.  This been associated with nausea and multiple episodes of vomiting, but he denies any abdominal pain or diarrhea.  He is not aware of any fevers and denies any pain in his chest.  He is not aware of any sick contacts.     Physical Exam   Triage Vital Signs: ED Triage Vitals  Enc Vitals Group     BP 10/30/22 2116 139/81     Pulse Rate 10/30/22 2116 79     Resp 10/30/22 2116 19     Temp 10/30/22 2117 99.5 F (37.5 C)     Temp Source 10/30/22 2117 Oral     SpO2 10/30/22 2116 96 %     Weight 10/30/22 2117 175 lb (79.4 kg)     Height 10/30/22 2117 '5\' 10"'$  (1.778 m)     Head Circumference --      Peak Flow --      Pain Score 10/30/22 2117 0     Pain Loc --      Pain Edu? --      Excl. in [place]? --     Most recent vital signs: Vitals:   10/30/22 2117 10/30/22 2230  BP:  123/73  Pulse:  75  Resp:  16  Temp: 99.5 F (37.5 C)   SpO2:  97%    Constitutional: Alert and oriented. Eyes: Conjunctivae are normal. Head: Atraumatic. Nose: No congestion/rhinnorhea. Mouth/Throat: Mucous membranes are dry. Cardiovascular: Normal rate, regular rhythm. Grossly normal heart sounds.  2+ radial pulses bilaterally. Respiratory: Normal respiratory effort.  No retractions. Lungs CTAB. Gastrointestinal: Soft and nontender. No distention. Musculoskeletal: No lower extremity tenderness nor edema.  Neurologic:  Normal speech and  language. No gross focal neurologic deficits are appreciated.    ED Results / Procedures / Treatments   Labs (all labs ordered are listed, but only abnormal results are displayed) Labs Reviewed  COMPREHENSIVE METABOLIC PANEL - Abnormal; Notable for the following components:      Result Value   CO2 16 (*)    Glucose, Bld 117 (*)    BUN 40 (*)    Creatinine, Ser 2.92 (*)    Albumin 3.4 (*)    Total Bilirubin 1.4 (*)    GFR, Estimated 22 (*)    All other components within normal limits  CBC WITH DIFFERENTIAL/PLATELET - Abnormal; Notable for the following components:   RBC 2.75 (*)    Hemoglobin 8.4 (*)    HCT 27.7 (*)    MCV 100.7 (*)    RDW 18.5 (*)    Neutro Abs 7.9 (*)    Monocytes Absolute 1.1 (*)    All other components within normal limits  TROPONIN I (HIGH SENSITIVITY) - Abnormal; Notable for the following components:   Troponin I (High Sensitivity) 31 (*)    All other components within normal limits  RESP PANEL BY RT-PCR (RSV, FLU A&B, COVID)  RVPGX2  LIPASE, BLOOD  URINALYSIS, ROUTINE W REFLEX MICROSCOPIC     EKG  ED ECG REPORT I, Blake Divine, the attending physician, personally viewed and interpreted this ECG.   Date: 10/30/2022  EKG Time: 21:18  Rate: 75  Rhythm: Ventricular paced rhythm  Axis: Normal  Intervals:nonspecific intraventricular conduction delay  ST&T Change: None  RADIOLOGY Chest x-ray reviewed and interpreted by me with no infiltrate, edema, or effusion.  PROCEDURES:  Critical Care performed: No  Procedures   MEDICATIONS ORDERED IN ED: Medications  ondansetron (ZOFRAN) injection 4 mg (4 mg Intravenous Given 10/30/22 2207)  lactated ringers bolus 1,000 mL (1,000 mLs Intravenous New Bag/Given 10/30/22 2228)     IMPRESSION / MDM / ASSESSMENT AND PLAN / ED COURSE  I reviewed the triage vital signs and the nursing notes.                              72 y.o. male with past medical history of hypertension, hyperlipidemia,  complete heart block status post pacemaker, CHF, CKD, stroke, DVT, and bipolar disorder who presents to the ED complaining of increasing difficulty breathing with dry cough over the past 24 to 48 hours associated with nausea and vomiting.  Patient's presentation is most consistent with acute presentation with potential threat to life or bodily function.  Differential diagnosis includes, but is not limited to, pneumonia, CHF exacerbation, COPD, bronchitis, COVID-19, influenza, dehydration, electrolyte abnormality, AKI, anemia.  Patient nontoxic-appearing and in no acute distress, vital signs are unremarkable.  He is not in any respiratory distress and maintaining oxygen saturations at 96% on room air.  Lungs are clear to auscultation bilaterally and no findings on exam concerning for CHF exacerbation.  EKG shows ventricular paced rhythm, will further assess with labs, chest x-ray, and viral testing.  Labs show anemia that is stable from patient's recent admission to [facility] health, no significant leukocytosis noted.  Renal function is also stable compared to previous, no acute electrolyte abnormality noted.  Troponin mildly elevated but similar to what would be expected for his CKD, low suspicion for ACS or PE at this time.  Viral testing is negative, urinalysis is pending.  Plan for p.o. challenge following urinalysis, after which patient would be appropriate for discharge home if he tolerates oral intake.  Findings reviewed with patient's wife over the phone, who is in agreement with plan.  Patient turned over to oncoming rider pending urinalysis and p.o. challenge.      FINAL CLINICAL IMPRESSION(S) / ED DIAGNOSES   Final diagnoses:  Nausea and vomiting, unspecified vomiting type  Shortness of breath  Chronic anemia     Rx / DC Orders   ED Discharge Orders          Ordered    ondansetron (ZOFRAN-ODT) 4 MG disintegrating tablet  Every 8 hours PRN        10/30/22 2334              Note:  This document was prepared using Dragon voice recognition software and may include unintentional dictation errors.   Blake Divine, MD 10/30/22 2337

## 2022-10-30 NOTE — ED Triage Notes (Signed)
Pt bib ems from home c/o cough and sob x 2 days. Pt denies being around anyone sick but was recently admitted here.

## 2022-10-30 NOTE — ED Provider Notes (Signed)
11:30 PM  Assumed care at shift change.  Pt awaiting UA, po challenge.  12:10 AM  Pt's urine shows no sign of infection or ketonuria.  Patient tolerating p.o.  Plan was for discharge home per previous provider who is already discussed this with the patient's family.     Shareese Macha, Delice Bison, DO 10/31/22 [phone]

## 2022-10-30 NOTE — ED Notes (Signed)
Pt drinking water, no new complaints of n/v

## 2022-10-31 LAB — URINALYSIS, ROUTINE W REFLEX MICROSCOPIC
Bacteria, UA: NONE SEEN
Bilirubin Urine: NEGATIVE
Glucose, UA: NEGATIVE mg/dL
Hgb urine dipstick: NEGATIVE
Ketones, ur: NEGATIVE mg/dL
Leukocytes,Ua: NEGATIVE
Nitrite: NEGATIVE
Protein, ur: 30 mg/dL — AB
Specific Gravity, Urine: 1.014 (ref 1.005–1.030)
Squamous Epithelial / HPF: NONE SEEN /HPF (ref 0–5)
pH: 5 (ref 5.0–8.0)

## 2022-10-31 MED ORDER — ONDANSETRON 4 MG PO TBDP: 4.0000 mg | ORAL_TABLET | Freq: Four times a day (QID) | ORAL | Status: DC | PRN

## 2022-10-31 NOTE — Discharge Instructions (Signed)
Your chest x-ray showed no sign of infection or fluid on your lungs.  Your COVID, flu and RSV test were negative.  Your urine showed no sign of infection or dehydration.  You may have a viral illness causing your symptoms.  You do not need antibiotics today.

## 2022-11-02 ENCOUNTER — Ambulatory Visit: Payer: Medicare Other

## 2022-11-06 ENCOUNTER — Ambulatory Visit: Payer: Medicare Other | Admitting: Family Medicine

## 2022-11-10 NOTE — Progress Notes (Deleted)
Name: Jeffrey Herring   MRN: [id number]    DOB: 06/03/1951   Date:11/10/2022       Progress Note  Subjective  Chief Complaint  Hospital Discharge Follow-Up  HPI  Admitted: 10/30/22 Discharged: 10/31/22  Patient Active Problem List   Diagnosis Date Noted   History of CVA in adulthood 08/16/2022   Polyp of sigmoid colon    Nodule of rectum    Polyp of ascending colon    Hyperparathyroidism, secondary renal ([place]) 07/06/2020   Nasal congestion with rhinorrhea 11/06/2018   Chronic kidney disease, stage 4 (severe) ([place]) 10/24/2017   Dilated cardiomyopathy ([place]) 02/18/2017   BPH (benign prostatic hyperplasia) 123456   Chronic systolic heart failure ([place]) 10/28/2015   HLD (hyperlipidemia) 10/28/2015   H/O deep venous thrombosis 10/28/2015   Personal history of other venous thrombosis and embolism 10/28/2015   History of sepsis 04/09/2014   Inguinal hernia 01/06/2014   Fitting or adjustment of cardiac pacemaker 01/05/2014   Complete atrioventricular block ([place]) 10/14/2013   Sinoatrial node dysfunction ([place]) 10/14/2013   Kidney cysts 05/06/2013   Hypertension, benign 10/01/2012   Healed or old pulmonary embolism 09/30/2012   Artificial cardiac pacemaker 09/30/2012   History of endocarditis 09/30/2012   Bipolar 1 disorder ([place]) 05/01/2012   Arteriosclerosis of coronary artery 06/16/2009    Past Surgical History:  Procedure Laterality Date   BACK SURGERY     CARDIAC SURGERY     COLONOSCOPY WITH PROPOFOL N/A 11/26/2020   Procedure: COLONOSCOPY WITH PROPOFOL;  Surgeon: Virgel Manifold, MD;  Location: [facility] ENDOSCOPY;  Service: Endoscopy;  Laterality: N/A;   HERNIA REPAIR Right 02/05/14   NOSE SURGERY     PACEMAKER PLACEMENT     SPINE SURGERY      Family History  Problem Relation Age of Onset   Depression Mother     Social History   Tobacco Use   Smoking status: Former    Types: Cigars    Quit date: 02/05/2018    Years since quitting: 4.7    Passive exposure: Past    Smokeless tobacco: Never   Tobacco comments:    smoking cessation materials not required  Substance Use Topics   Alcohol use: No    Alcohol/week: 0.0 standard drinks of alcohol     Current Outpatient Medications:    ALPRAZolam (XANAX) 0.5 MG tablet, Take 1 tablet (0.5 mg total) by mouth 3 (three) times daily., Disp: 90 tablet, Rfl: 5   cholecalciferol (VITAMIN D) 1000 units tablet, Take 1,000 Units by mouth daily., Disp: , Rfl:    clopidogrel (PLAVIX) 75 MG tablet, Take 1 tablet (75 mg total) by mouth every morning., Disp: 90 tablet, Rfl: 1   hydrALAZINE (APRESOLINE) 10 MG tablet, Take 1 tablet by mouth 2 (two) times a day., Disp: , Rfl:    lamoTRIgine (LAMICTAL) 200 MG tablet, Take 1 tablet (200 mg total) by mouth daily., Disp: 90 tablet, Rfl: 1   metoprolol succinate (TOPROL-XL) 25 MG 24 hr tablet, Take 0.5 tablets by mouth daily., Disp: , Rfl:    OLANZapine (ZYPREXA) 5 MG tablet, TAKE 1 TABLET BY MOUTH ATBEDTIME., Disp: 90 tablet, Rfl: 1   ondansetron (ZOFRAN-ODT) 4 MG disintegrating tablet, Take 1 tablet (4 mg total) by mouth every 8 (eight) hours as needed for nausea or vomiting., Disp: 12 tablet, Rfl: 0   rosuvastatin (CRESTOR) 20 MG tablet, Take 1 tablet (20 mg total) by mouth daily., Disp: 90 tablet, Rfl: 1   tamsulosin (FLOMAX) 0.4 MG CAPS  capsule, Take 1 capsule (0.4 mg total) by mouth every morning., Disp: 90 capsule, Rfl: 1  Allergies  Allergen Reactions   Aspirin Hives, Shortness Of Breath and Palpitations   Vancomycin Rash    See ID note from 11/24/2008, with subsequent severe relapse December 2023 following a single dose of vanc   Codeine Nausea And Vomiting   Demerol [Meperidine] Nausea And Vomiting   Lithium      acute renal failure    I personally reviewed active problem list, medication list, allergies, family history, social history, health maintenance with the patient/caregiver today.   ROS  ***  Objective  There were no vitals filed for this  visit.  There is no height or weight on file to calculate BMI.  Physical Exam ***   PHQ2/9:    02/13/2022    2:30 PM 11/01/2021    3:09 PM 07/08/2021   11:31 AM 01/04/2021   10:56 AM 10/19/2020   10:53 AM  Depression screen PHQ 2/9  Decreased Interest 0 0 0 0 0  Down, Depressed, Hopeless 0 0 0 0 0  PHQ - 2 Score 0 0 0 0 0  Altered sleeping 0  1    Tired, decreased energy 0  1    Change in appetite 0  0    Feeling bad or failure about yourself  0  0    Trouble concentrating 0  0    Moving slowly or fidgety/restless 0  0    Suicidal thoughts 0  0    PHQ-9 Score 0  2    Difficult doing work/chores   Not difficult at all      phq 9 is {gen pos [id number]   Fall Risk:    08/16/2022   10:55 AM 02/13/2022    2:30 PM 11/01/2021    3:11 PM 07/08/2021   11:30 AM 01/04/2021   10:56 AM  Fall Risk   Falls in the past year? 0 0 0 0 0  Number falls in past yr:  0 0  0  Injury with Fall?  0 0  0  Risk for fall due to : No Fall Risks No Fall Risks No Fall Risks    Follow up Falls prevention discussed;Education provided;Falls evaluation completed Falls prevention discussed Falls prevention discussed Falls prevention discussed       Functional Status Survey:      Assessment & Plan   1. Hospital discharge follow-up ***

## 2022-11-13 ENCOUNTER — Ambulatory Visit (INDEPENDENT_AMBULATORY_CARE_PROVIDER_SITE_OTHER): Payer: Medicare Other | Admitting: Family Medicine

## 2022-11-13 ENCOUNTER — Encounter: Payer: Self-pay | Admitting: Family Medicine

## 2022-11-13 ENCOUNTER — Telehealth: Payer: Self-pay | Admitting: Family Medicine

## 2022-11-13 ENCOUNTER — Inpatient Hospital Stay: Payer: Medicare Other | Admitting: Family Medicine

## 2022-11-13 VITALS — BP 116/68 | HR 95 | Resp 16 | Ht 70.0 in | Wt 171.0 lb

## 2022-11-13 DIAGNOSIS — N179 Acute kidney failure, unspecified: Secondary | ICD-10-CM

## 2022-11-13 DIAGNOSIS — N186 End stage renal disease: Secondary | ICD-10-CM | POA: Diagnosis not present

## 2022-11-13 DIAGNOSIS — T3795XA Adverse effect of unspecified systemic anti-infective and antiparasitic, initial encounter: Secondary | ICD-10-CM | POA: Diagnosis not present

## 2022-11-13 DIAGNOSIS — I5022 Chronic systolic (congestive) heart failure: Secondary | ICD-10-CM

## 2022-11-13 DIAGNOSIS — Z09 Encounter for follow-up examination after completed treatment for conditions other than malignant neoplasm: Secondary | ICD-10-CM

## 2022-11-13 DIAGNOSIS — I420 Dilated cardiomyopathy: Secondary | ICD-10-CM | POA: Diagnosis not present

## 2022-11-13 DIAGNOSIS — Z992 Dependence on renal dialysis: Secondary | ICD-10-CM

## 2022-11-13 DIAGNOSIS — L270 Generalized skin eruption due to drugs and medicaments taken internally: Secondary | ICD-10-CM

## 2022-11-13 NOTE — Telephone Encounter (Signed)
LVM for pt to rtn my call to schedule AWV with [facility]. Please schedule AWV if patient calls the office. °

## 2022-11-13 NOTE — Progress Notes (Signed)
Name: Jeffrey Herring   MRN: [id number]    DOB: 1951-07-06   Date:11/13/2022       Progress Note  Subjective  Chief Complaint  Hospital Discharge Follow-Up  HPI  Admitted 10/03/2022 Discharged 10/07/2022  Admitted: 10/10/2022 [facility]  Discharged: 10/27/2022 discharged to [facility]  Discharged home 10/29/2022  Martin Majestic to [facility] 10/30/2022 for vomiting   He went for to [facility] for admission for pacemaker replacement on 10/03/2022 and was found to be hypoxic, he was diuresed and  had a successful procedure of pacemaker generator removal and replacement for battery depletion, during his stay he has some aflutter, had to be diuresed and kidney function dropped. He was sent home , wife noticed he was red and weak, he did not eat and she had to take him back to [facility] on 10/10/2022. He was found to have a rash , rhabdomyolyses and acute on chronic kidney failure. During his stay he had to have HD. The rash was secondary to allergic reaction to vancomycin . His kidney function improved. Low Hbg upon discharge but normal iron storage. Patient went to an assistant living facility but wife took him home after a couple of days , he developed vomiting and went back to [facility] but since it resolved - he was sent home.   Today he feels like he is back to his baseline, still has SOB ( reviewed CT done chest with old scar and stable) , he has CHF with EF of 30 %. Appetite is back to normal, he is still tired but per wife that is his baseline.   Advised follow up with cardiologist and nephrologist. Keep his regular visit with me and we will recheck his labs at that time    Patient Active Problem List   Diagnosis Date Noted   History of CVA in adulthood 08/16/2022   Polyp of sigmoid colon    Nodule of rectum    Polyp of ascending colon    Hyperparathyroidism, secondary renal ([place]) 07/06/2020   Nasal congestion with rhinorrhea 11/06/2018   Chronic kidney disease, stage 4 (severe) ([place]) 10/24/2017   Dilated  cardiomyopathy ([place]) 02/18/2017   BPH (benign prostatic hyperplasia) 61/60/7371   Chronic systolic heart failure ([place]) 10/28/2015   HLD (hyperlipidemia) 10/28/2015   H/O deep venous thrombosis 10/28/2015   Personal history of other venous thrombosis and embolism 10/28/2015   History of sepsis 04/09/2014   Inguinal hernia 01/06/2014   Fitting or adjustment of cardiac pacemaker 01/05/2014   Complete atrioventricular block ([place]) 10/14/2013   Sinoatrial node dysfunction ([place]) 10/14/2013   Kidney cysts 05/06/2013   Hypertension, benign 10/01/2012   Healed or old pulmonary embolism 09/30/2012   Artificial cardiac pacemaker 09/30/2012   History of endocarditis 09/30/2012   Bipolar 1 disorder ([place]) 05/01/2012   Arteriosclerosis of coronary artery 06/16/2009    Past Surgical History:  Procedure Laterality Date   BACK SURGERY     CARDIAC SURGERY     COLONOSCOPY WITH PROPOFOL N/A 11/26/2020   Procedure: COLONOSCOPY WITH PROPOFOL;  Surgeon: Virgel Manifold, MD;  Location: [facility] ENDOSCOPY;  Service: Endoscopy;  Laterality: N/A;   HERNIA REPAIR Right 02/05/14   NOSE SURGERY     PACEMAKER PLACEMENT     SPINE SURGERY      Family History  Problem Relation Age of Onset   Depression Mother     Social History   Tobacco Use   Smoking status: Former    Types: Cigars    Quit date:  02/05/2018    Years since quitting: 4.7    Passive exposure: Past   Smokeless tobacco: Never   Tobacco comments:    smoking cessation materials not required  Substance Use Topics   Alcohol use: No    Alcohol/week: 0.0 standard drinks of alcohol     Current Outpatient Medications:    ALPRAZolam (XANAX) 0.5 MG tablet, Take 1 tablet (0.5 mg total) by mouth 3 (three) times daily., Disp: 90 tablet, Rfl: 5   cholecalciferol (VITAMIN D) 1000 units tablet, Take 1,000 Units by mouth daily., Disp: , Rfl:    clopidogrel (PLAVIX) 75 MG tablet, Take 1 tablet (75 mg total) by mouth every morning., Disp: 90 tablet, Rfl:  1   hydrALAZINE (APRESOLINE) 10 MG tablet, Take 1 tablet by mouth 2 (two) times a day., Disp: , Rfl:    lamoTRIgine (LAMICTAL) 200 MG tablet, Take 1 tablet (200 mg total) by mouth daily., Disp: 90 tablet, Rfl: 1   metoprolol succinate (TOPROL-XL) 25 MG 24 hr tablet, Take 0.5 tablets by mouth daily., Disp: , Rfl:    OLANZapine (ZYPREXA) 5 MG tablet, TAKE 1 TABLET BY MOUTH ATBEDTIME., Disp: 90 tablet, Rfl: 1   ondansetron (ZOFRAN-ODT) 4 MG disintegrating tablet, Take 1 tablet (4 mg total) by mouth every 8 (eight) hours as needed for nausea or vomiting., Disp: 12 tablet, Rfl: 0   rosuvastatin (CRESTOR) 20 MG tablet, Take 1 tablet (20 mg total) by mouth daily., Disp: 90 tablet, Rfl: 1   tamsulosin (FLOMAX) 0.4 MG CAPS capsule, Take 1 capsule (0.4 mg total) by mouth every morning., Disp: 90 capsule, Rfl: 1  Allergies  Allergen Reactions   Aspirin Hives, Shortness Of Breath and Palpitations   Vancomycin Rash    See ID note from 11/24/2008, with subsequent severe relapse December 2023 following a single dose of vanc   Codeine Nausea And Vomiting   Demerol [Meperidine] Nausea And Vomiting   Lithium      acute renal failure    I personally reviewed active problem list, medication list, allergies, family history, social history, health maintenance with the patient/caregiver today.   ROS  Ten systems reviewed and is negative except as mentioned in HPI   Objective  Vitals:   11/13/22 1033  BP: 116/68  Pulse: 95  Resp: 16  SpO2: 96%  Weight: 171 lb (77.6 kg)  Height: '5\' 10"'$  (1.778 m)    Body mass index is 24.54 kg/m.  Physical Exam  Constitutional: Patient appears well-developed and well-nourished. No distress.  HEENT: head atraumatic, normocephalic, pupils equal and reactive to light, neck supple Cardiovascular: Normal rate, regular rhythm and normal heart sounds.  No murmur heard. No BLE edema. Pulmonary/Chest: Effort normal and breath sounds normal. No respiratory  distress. Abdominal: Soft.  There is no tenderness. Psychiatric: Patient has a normal mood and affect. behavior is normal. Judgment and thought content normal.    PHQ2/9:    11/13/2022   10:33 AM 02/13/2022    2:30 PM 11/01/2021    3:09 PM 07/08/2021   11:31 AM 01/04/2021   10:56 AM  Depression screen PHQ 2/9  Decreased Interest 1 0 0 0 0  Down, Depressed, Hopeless 0 0 0 0 0  PHQ - 2 Score 1 0 0 0 0  Altered sleeping 0 0  1   Tired, decreased energy 1 0  1   Change in appetite 0 0  0   Feeling bad or failure about yourself  0 0  0   Trouble  concentrating 0 0  0   Moving slowly or fidgety/restless 0 0  0   Suicidal thoughts 0 0  0   PHQ-9 Score 2 0  2   Difficult doing work/chores    Not difficult at all     phq 9 is positive - under the care of psychiatrist    Fall Risk:    11/13/2022   10:33 AM 08/16/2022   10:55 AM 02/13/2022    2:30 PM 11/01/2021    3:11 PM 07/08/2021   11:30 AM  Fall Risk   Falls in the past year? 0 0 0 0 0  Number falls in past yr: 0  0 0   Injury with Fall? 0  0 0   Risk for fall due to : No Fall Risks No Fall Risks No Fall Risks No Fall Risks   Follow up Falls prevention discussed Falls prevention discussed;Education provided;Falls evaluation completed Falls prevention discussed Falls prevention discussed Falls prevention discussed      Functional Status Survey: Is the patient deaf or have difficulty hearing?: No Does the patient have difficulty seeing, even when wearing glasses/contacts?: No Does the patient have difficulty concentrating, remembering, or making decisions?: Yes Does the patient have difficulty walking or climbing stairs?: Yes Does the patient have difficulty dressing or bathing?: Yes Does the patient have difficulty doing errands alone such as visiting a doctor's office or shopping?: Yes    Assessment & Plan  1. Acute renal failure superimposed on chronic kidney disease, on chronic dialysis, unspecified acute renal failure type  ([place])  Back to baseline, but needs to follow up with nephrologist   2. Dilated cardiomyopathy ([place])  Keep follow up with cardiologist   3. Chronic systolic heart failure (HCC)  Likely the cause of his chronic SOB but likely worse now due to anemia  4. Allergic drug rash due to anti-infective agent  Vacomycin

## 2022-11-15 DIAGNOSIS — I420 Dilated cardiomyopathy: Secondary | ICD-10-CM | POA: Diagnosis not present

## 2022-11-15 DIAGNOSIS — I442 Atrioventricular block, complete: Secondary | ICD-10-CM | POA: Diagnosis not present

## 2022-11-15 DIAGNOSIS — Z1330 Encounter for screening examination for mental health and behavioral disorders, unspecified: Secondary | ICD-10-CM | POA: Diagnosis not present

## 2022-11-15 DIAGNOSIS — I483 Typical atrial flutter: Secondary | ICD-10-CM | POA: Diagnosis not present

## 2022-11-15 DIAGNOSIS — I2510 Atherosclerotic heart disease of native coronary artery without angina pectoris: Secondary | ICD-10-CM | POA: Diagnosis not present

## 2022-11-15 DIAGNOSIS — I10 Essential (primary) hypertension: Secondary | ICD-10-CM | POA: Diagnosis not present

## 2022-12-11 ENCOUNTER — Other Ambulatory Visit: Payer: Self-pay | Admitting: Family Medicine

## 2022-12-11 DIAGNOSIS — N138 Other obstructive and reflux uropathy: Secondary | ICD-10-CM

## 2023-01-04 DIAGNOSIS — Z888 Allergy status to other drugs, medicaments and biological substances status: Secondary | ICD-10-CM | POA: Diagnosis not present

## 2023-01-04 DIAGNOSIS — E785 Hyperlipidemia, unspecified: Secondary | ICD-10-CM | POA: Diagnosis not present

## 2023-01-04 DIAGNOSIS — I483 Typical atrial flutter: Secondary | ICD-10-CM | POA: Diagnosis not present

## 2023-01-04 DIAGNOSIS — I2510 Atherosclerotic heart disease of native coronary artery without angina pectoris: Secondary | ICD-10-CM | POA: Diagnosis not present

## 2023-01-04 DIAGNOSIS — E119 Type 2 diabetes mellitus without complications: Secondary | ICD-10-CM | POA: Diagnosis not present

## 2023-01-04 DIAGNOSIS — I10 Essential (primary) hypertension: Secondary | ICD-10-CM | POA: Diagnosis not present

## 2023-01-10 DIAGNOSIS — Z8679 Personal history of other diseases of the circulatory system: Secondary | ICD-10-CM | POA: Diagnosis not present

## 2023-01-10 DIAGNOSIS — I420 Dilated cardiomyopathy: Secondary | ICD-10-CM | POA: Diagnosis not present

## 2023-01-10 DIAGNOSIS — I442 Atrioventricular block, complete: Secondary | ICD-10-CM | POA: Diagnosis not present

## 2023-01-10 DIAGNOSIS — I10 Essential (primary) hypertension: Secondary | ICD-10-CM | POA: Diagnosis not present

## 2023-01-10 DIAGNOSIS — I2510 Atherosclerotic heart disease of native coronary artery without angina pectoris: Secondary | ICD-10-CM | POA: Diagnosis not present

## 2023-01-23 DIAGNOSIS — I10 Essential (primary) hypertension: Secondary | ICD-10-CM | POA: Diagnosis not present

## 2023-01-23 DIAGNOSIS — I2510 Atherosclerotic heart disease of native coronary artery without angina pectoris: Secondary | ICD-10-CM | POA: Diagnosis not present

## 2023-01-23 DIAGNOSIS — I420 Dilated cardiomyopathy: Secondary | ICD-10-CM | POA: Diagnosis not present

## 2023-02-12 ENCOUNTER — Encounter (HOSPITAL_COMMUNITY): Payer: Self-pay | Admitting: Psychiatry

## 2023-02-12 ENCOUNTER — Telehealth (HOSPITAL_BASED_OUTPATIENT_CLINIC_OR_DEPARTMENT_OTHER): Payer: Medicare Other | Admitting: Psychiatry

## 2023-02-12 VITALS — Wt 178.0 lb

## 2023-02-12 DIAGNOSIS — F319 Bipolar disorder, unspecified: Secondary | ICD-10-CM | POA: Diagnosis not present

## 2023-02-12 DIAGNOSIS — F411 Generalized anxiety disorder: Secondary | ICD-10-CM

## 2023-02-12 DIAGNOSIS — F09 Unspecified mental disorder due to known physiological condition: Secondary | ICD-10-CM

## 2023-02-12 MED ORDER — OLANZAPINE 5 MG PO TABS: ORAL_TABLET | ORAL | 1 refills | Status: DC

## 2023-02-12 MED ORDER — LAMOTRIGINE 200 MG PO TABS: 200.0000 mg | ORAL_TABLET | Freq: Every day | ORAL | 1 refills | Status: DC

## 2023-02-12 MED ORDER — ALPRAZOLAM 0.5 MG PO TABS: 0.5000 mg | ORAL_TABLET | Freq: Three times a day (TID) | ORAL | 5 refills | Status: DC

## 2023-02-12 NOTE — Progress Notes (Signed)
[facility] MD Virtual Progress Note   Patient Location: Home  Provider Location: Home Office  I connect with patient by video and verified that I am speaking with correct person by using two identifiers. I discussed the limitations of evaluation and management by telemedicine and the availability of in person appointments. I also discussed with the patient that there may be a patient responsible charge related to this service. The patient expressed understanding and agreed to proceed.  Jeffrey Herring [id number] 72 y.o.  02/12/2023 9:58 AM  History of Present Illness:  Patient is evaluated by video session.  He had a hard time focusing on conversation.  His wife helped the conversation.  Patient was admitted in the hospital due to complication of vancomycin reaction.  He was also for short-term on dialysis and sent to the rehab a few days.  Patient's wife told he did not stay rehab for long because she noticed started to deteriorate more.  He is doing better.  He is back to his baseline.  He does remember my name and reason why I am seeing him.  His wife reported his cognition sometime improve and then to 238.  But overall he is doing fine.  He is not agitated, angry, having hallucination or any paranoia.  He does not drive as wife takes him to the doctor's appointment.  His balance is better and does not use any walker or cane.  His appetite is okay.  He sleeps too much.  His wife admitted that she did not cut down the olanzapine and is still giving him 10 mg.  I explained it should be cut down to 7.5 in the past and then last visit we cut down further 5 mg.  Patient's wife agreed that she was little bit concerned cutting down the medication but now she agreed to cut down to 5 mg since he is taking multiple medication and there has been no recent behavior changes.  He is calm, cooperative and does not had any panic attack.  His memory is declining but stable.  He was last seen neurology  who mentioned that his CT scan is unchanged from past 15 years and unlikely have Lewy body.  His last BUN was 31, creatinine 2.80.  His hemoglobin is 11.4.  He admitted sometimes tired but denies any suicidal thoughts or homicidal thoughts.  He saw his grandkids when he was in the hospital and that was a good time.  His energy level is poor.  He denies any anhedonia or any feeling of hopelessness.  Past Psychiatric History: H/O bipolar disorder. H/O inpatient mania and psychosis.  Good response with lithium until had a lithium toxicity.  Took Abilify, Risperdal, Prozac, Lexapro, Wellbutrin and Navane.    Outpatient Encounter Medications as of 02/12/2023  Medication Sig   ALPRAZolam (XANAX) 0.5 MG tablet Take 1 tablet (0.5 mg total) by mouth 3 (three) times daily.   cholecalciferol (VITAMIN D) 1000 units tablet Take 1,000 Units by mouth daily.   clopidogrel (PLAVIX) 75 MG tablet Take 1 tablet (75 mg total) by mouth every morning.   hydrALAZINE (APRESOLINE) 10 MG tablet Take 1 tablet by mouth 2 (two) times a day.   lamoTRIgine (LAMICTAL) 200 MG tablet Take 1 tablet (200 mg total) by mouth daily.   metoprolol succinate (TOPROL-XL) 25 MG 24 hr tablet Take 0.5 tablets by mouth daily.   OLANZapine (ZYPREXA) 5 MG tablet TAKE 1 TABLET BY MOUTH ATBEDTIME.   ondansetron (ZOFRAN-ODT) 4 MG disintegrating  tablet Take 1 tablet (4 mg total) by mouth every 8 (eight) hours as needed for nausea or vomiting.   rosuvastatin (CRESTOR) 20 MG tablet Take 1 tablet (20 mg total) by mouth daily.   tamsulosin (FLOMAX) 0.4 MG CAPS capsule TAKE 1 CAPSULE (0.4 MG TOTAL) BY MOUTH EVERY MORNING.   No facility-administered encounter medications on file as of 02/12/2023.    No results found for this or any previous visit (from the past 2160 hour(s)).   Psychiatric Specialty Exam: Physical Exam  Review of Systems  Neurological:        Memory impairment  Psychiatric/Behavioral:  Positive for decreased concentration.      Weight 178 lb (80.7 kg).There is no height or weight on file to calculate BMI.  General Appearance: Fairly Groomed  Eye Contact:  Fair  Speech:  Slow  Volume:  Decreased  Mood:   tired  Affect:  Restricted  Thought Process:  Descriptions of Associations: Intact  Orientation:  Full (Time, Place, and Person)  Thought Content:  Rumination  Suicidal Thoughts:  No  Homicidal Thoughts:  No  Memory:  Immediate;   Fair Recent;   Fair Remote;   Fair  Judgement:  Fair  Insight:  Shallow  Psychomotor Activity:  Decreased  Concentration:  Concentration: Fair and Attention Span: Fair  Recall:  [employer] of Knowledge:  Fair  Language:  Fair  Akathisia:  No  Handed:  Right  AIMS (if indicated):     Assets:  Communication Skills Desire for Improvement Housing Social Support  ADL's:  Intact  Cognition:  Impaired,  Mild  Sleep:  too much     Assessment/Plan: Bipolar 1 disorder (HCC) - Plan: OLANZapine (ZYPREXA) 5 MG tablet, lamoTRIgine (LAMICTAL) 200 MG tablet, ALPRAZolam (XANAX) 0.5 MG tablet  GAD (generalized anxiety disorder) - Plan: ALPRAZolam (XANAX) 0.5 MG tablet  Cognitive disorder - Plan: OLANZapine (ZYPREXA) 5 MG tablet  I reviewed notes from other providers, blood work results and current medication.  He has a difficult time 2 months ago when he had a reaction to vancomycin and also had pacemaker issues but now he is feeling better.  His creatinine is high but stable.  We discussed since patient behavior is mostly stable and he does not have any recent mania or agitation we should try to cut down the olanzapine.  Patient's wife agreed with the plan and we will reduce to olanzapine 5 mg however if she notices any change in mood, worsening of symptoms then she will call us back.  We will keep the Lamictal 200 mg daily and Xanax 0.5 mg 3 times a day.  If patient tolerate lower dose of olanzapine then in the future we may consider further lowering the medication.  Recommended to call  us back if any question or any concern.  Follow-up in 6 months.   Follow Up Instructions:     I discussed the assessment and treatment plan with the patient. The patient was provided an opportunity to ask questions and all were answered. The patient agreed with the plan and demonstrated an understanding of the instructions.   The patient was advised to call back or seek an in-person evaluation if the symptoms worsen or if the condition fails to improve as anticipated.    Collaboration of Care: Other provider involved in patient's care AEB notes are available in epic to review.    Patient/Guardian was advised Release of Information must be obtained prior to any record release in order to  collaborate their care with an outside provider. Patient/Guardian was advised if they have not already done so to contact the registration department to sign all necessary forms in order for [place] to release information regarding their care.   Consent: Patient/Guardian gives verbal consent for treatment and assignment of benefits for services provided during this visit. Patient/Guardian expressed understanding and agreed to proceed.     I provided 28 minutes of non face to face time during this encounter.  Note: This document was prepared by [employer] voice dictation technology and any errors that results from this process are unintentional.    Cleotis Nipper, MD 02/12/2023

## 2023-02-13 NOTE — Progress Notes (Unsigned)
Name: Jeffrey Herring   MRN: [id number]    DOB: 05-11-1951   Date:02/14/2023       Progress Note  Subjective  Chief Complaint  Follow Up  HPI  Dilated cardiomyopathy/sinoatrial node dysfunction/CHF chronic systolic/sp stent placed in 2014 :  under the care of Dr. Lorenso Courier at [place]., last EF was 37 % symptoms have been stable, wife gives most of the history. He uses two pillows to sleep , has SOB with exertion that is stable.  He has a pacemaker.  First one in 1983, had some vegetation on pacemaker leads back in 2009 and also sees Dr. Mayme Genta at [facility] for  pacemaker was replaced again 01/2018   Last pacemaker removed due to battery replacement done 10/03/2022 CAD and is on Plavix ,he also takes Crestor  but last LDL was 85, he does not want to add any new medications, discussed SGL-2 agonist. Wife asked about CHF clinic locally  Echo 12/13/2021 at [facility]:   Left Ventricle: Inferior akinesis.  Anteroseptal/septal dyskinesis  probably due to pacemaker activation.   Left Ventricle: EF: 30-35%.   Mitral Valve: There is moderate regurgitation.   Tricuspid Valve: There is mild to moderate regurgitation.   Impression: 01/2023  1) SPECT Tc57m Cardiolite stress myocardial perfusion imaging showed a moderate sized, severely intense fixed defect of the inferoseptal wall, suggestive of prior infarct with no reversible ischemia  2) There was no TID.   3) The LV ejection fraction was calculated post stress at 37 %   CKI:  Currently under the care of [facility] nephrologist,  last GFR was 23 in March 2024 , he denies generalized pruritus, good urine output    Bipolar disorder I: he is under the care of Dr. Lolly Mustache , he is still taking Lamictal, Zyprexa but dose was decreased from 20 to 15 mg prior to his last visit with me and is now down to 5 mg only.  he is still on same dose of  alprazolam . No recent manic episodes, He has been  disabled since age 27  He still has lack of motivation, but wife states he  has been stable for a long time. Unchanged   BPH: he has urinary frequency, taking Flomax to control symptoms.  He was seen by Dr. Richardo Hanks, we will recheck labs and advised wife to schedule a follow up  HTN: taking medication, no chest pain or palpitation, off hydralazine due to low bp  He has SOB with any activity ,  likely due to CHF and anemia    Cognitive dysfunction : he was seen by Dr. Sherryll Burger, neurologist, had MRI and labs, they have not been back to discuss results, but wife has been really worried since she started reading about Lewy Body dementia ( mentioned by Dr. Sherryll Burger during his visit). His behavior has been unchanged. Wife states since nothing to be done they do not want to go back for follow up  Patient Active Problem List   Diagnosis Date Noted   History of CVA in adulthood 08/16/2022   Polyp of sigmoid colon    Nodule of rectum    Polyp of ascending colon    Hyperparathyroidism, secondary renal (HCC) 07/06/2020   Nasal congestion with rhinorrhea 11/06/2018   Chronic kidney disease, stage 4 (severe) (HCC) 10/24/2017   Dilated cardiomyopathy (HCC) 02/18/2017   BPH (benign prostatic hyperplasia) 11/04/2015   Chronic systolic heart failure (HCC) 10/28/2015   HLD (hyperlipidemia) 10/28/2015   H/O deep venous thrombosis 10/28/2015  Personal history of other venous thrombosis and embolism 10/28/2015   History of sepsis 04/09/2014   Inguinal hernia 01/06/2014   Fitting or adjustment of cardiac pacemaker 01/05/2014   Complete atrioventricular block (HCC) 10/14/2013   Sinoatrial node dysfunction (HCC) 10/14/2013   Kidney cysts 05/06/2013   Hypertension, benign 10/01/2012   Healed or old pulmonary embolism 09/30/2012   Artificial cardiac pacemaker 09/30/2012   History of endocarditis 09/30/2012   Bipolar 1 disorder (HCC) 05/01/2012   Arteriosclerosis of coronary artery 06/16/2009    Past Surgical History:  Procedure Laterality Date   BACK SURGERY     CARDIAC SURGERY      COLONOSCOPY WITH PROPOFOL N/A 11/26/2020   Procedure: COLONOSCOPY WITH PROPOFOL;  Surgeon: Pasty Spillers, MD;  Location: [facility] ENDOSCOPY;  Service: Endoscopy;  Laterality: N/A;   HERNIA REPAIR Right 02/05/14   NOSE SURGERY     PACEMAKER PLACEMENT     SPINE SURGERY      Family History  Problem Relation Age of Onset   Depression Mother     Social History   Tobacco Use   Smoking status: Former    Types: Cigars    Quit date: 02/05/2018    Years since quitting: 5.0    Passive exposure: Past   Smokeless tobacco: Never   Tobacco comments:    smoking cessation materials not required  Substance Use Topics   Alcohol use: No    Alcohol/week: 0.0 standard drinks of alcohol     Current Outpatient Medications:    ALPRAZolam (XANAX) 0.5 MG tablet, Take 1 tablet (0.5 mg total) by mouth 3 (three) times daily., Disp: 90 tablet, Rfl: 5   cholecalciferol (VITAMIN D) 1000 units tablet, Take 1,000 Units by mouth daily., Disp: , Rfl:    clopidogrel (PLAVIX) 75 MG tablet, Take 1 tablet (75 mg total) by mouth every morning., Disp: 90 tablet, Rfl: 1   lamoTRIgine (LAMICTAL) 200 MG tablet, Take 1 tablet (200 mg total) by mouth daily., Disp: 90 tablet, Rfl: 1   metoprolol succinate (TOPROL-XL) 25 MG 24 hr tablet, Take 0.5 tablets by mouth daily., Disp: , Rfl:    OLANZapine (ZYPREXA) 5 MG tablet, TAKE 1 TABLET BY MOUTH ATBEDTIME., Disp: 90 tablet, Rfl: 1   rosuvastatin (CRESTOR) 20 MG tablet, Take 1 tablet (20 mg total) by mouth daily., Disp: 90 tablet, Rfl: 1   tamsulosin (FLOMAX) 0.4 MG CAPS capsule, TAKE 1 CAPSULE (0.4 MG TOTAL) BY MOUTH EVERY MORNING., Disp: 90 capsule, Rfl: 1   hydrALAZINE (APRESOLINE) 10 MG tablet, Take 1 tablet by mouth 2 (two) times a day. (Patient not taking: Reported on 02/14/2023), Disp: , Rfl:    ondansetron (ZOFRAN-ODT) 4 MG disintegrating tablet, Take 1 tablet (4 mg total) by mouth every 8 (eight) hours as needed for nausea or vomiting. (Patient not taking: Reported on  02/14/2023), Disp: 12 tablet, Rfl: 0  Allergies  Allergen Reactions   Aspirin Hives, Shortness Of Breath and Palpitations   Vancomycin Rash    See ID note from 11/24/2008, with subsequent severe relapse December 2023 following a single dose of vanc   Codeine Nausea And Vomiting   Demerol [Meperidine] Nausea And Vomiting   Lithium      acute renal failure    I personally reviewed active problem list, medication list, allergies, family history, social history, health maintenance with the patient/caregiver today.   ROS  Ten systems reviewed and is negative except as mentioned in HPI   Objective  Vitals:   02/14/23 1005  BP: 118/66  Pulse: 74  Resp: 16  SpO2: 96%  Weight: 176 lb (79.8 kg)  Height: 5\' 10"  (1.778 m)    Body mass index is 25.25 kg/m.  Physical Exam  Constitutional: Patient appears well-developed and well-nourished.  No distress.  HEENT: head atraumatic, normocephalic, pupils equal and reactive to light, neck supple Cardiovascular: Normal rate, regular rhythm and normal heart sounds.  No murmur heard. No BLE edema. Pulmonary/Chest: Effort normal and breath sounds normal. No respiratory distress. Abdominal: Soft.  There is no tenderness. Psychiatric: flat affect, most of the history obtained from wife  PHQ2/9:    02/14/2023   10:04 AM 11/13/2022   10:33 AM 02/13/2022    2:30 PM 11/01/2021    3:09 PM 07/08/2021   11:31 AM  Depression screen PHQ 2/9  Decreased Interest 0 1 0 0 0  Down, Depressed, Hopeless 0 0 0 0 0  PHQ - 2 Score 0 1 0 0 0  Altered sleeping 0 0 0  1  Tired, decreased energy 0 1 0  1  Change in appetite 0 0 0  0  Feeling bad or failure about yourself  0 0 0  0  Trouble concentrating 0 0 0  0  Moving slowly or fidgety/restless 0 0 0  0  Suicidal thoughts 0 0 0  0  PHQ-9 Score 0 2 0  2  Difficult doing work/chores     Not difficult at all    phq 9 is negative   Fall Risk:    02/14/2023   10:04 AM 11/13/2022   10:33 AM 08/16/2022   10:55  AM 02/13/2022    2:30 PM 11/01/2021    3:11 PM  Fall Risk   Falls in the past year? 0 0 0 0 0  Number falls in past yr: 0 0  0 0  Injury with Fall? 0 0  0 0  Risk for fall due to : No Fall Risks No Fall Risks No Fall Risks No Fall Risks No Fall Risks  Follow up Falls prevention discussed Falls prevention discussed Falls prevention discussed;Education provided;Falls evaluation completed Falls prevention discussed Falls prevention discussed      Functional Status Survey: Is the patient deaf or have difficulty hearing?: No Does the patient have difficulty seeing, even when wearing glasses/contacts?: No Does the patient have difficulty concentrating, remembering, or making decisions?: Yes Does the patient have difficulty walking or climbing stairs?: No Does the patient have difficulty dressing or bathing?: No Does the patient have difficulty doing errands alone such as visiting a doctor's office or shopping?: No    Assessment & Plan  1. Sinoatrial node dysfunction (HCC)  - clopidogrel (PLAVIX) 75 MG tablet; Take 1 tablet (75 mg total) by mouth every morning.  Dispense: 90 tablet; Refill: 1  2. Chronic systolic heart failure (HCC)  - clopidogrel (PLAVIX) 75 MG tablet; Take 1 tablet (75 mg total) by mouth every morning.  Dispense: 90 tablet; Refill: 1 - AMB referral to CHF clinic  3. Dilated cardiomyopathy (HCC)  - AMB referral to CHF clinic  4. Hyperparathyroidism, secondary renal [facility])  Sees nephrologist at [facility]  5. Complete atrioventricular block [facility])  He has pacemaker   6. Chronic kidney disease, stage 4 (severe) (HCC)  - PTH, intact and calcium - CBC with Differential/Platelet - COMPLETE METABOLIC PANEL WITH GFR  7. Bipolar 1 disorder (HCC)  Stable  8. ASCVD (arteriosclerotic cardiovascular disease)  - clopidogrel (PLAVIX) 75 MG tablet; Take 1 tablet (75  mg total) by mouth every morning.  Dispense: 90 tablet; Refill: 1 - rosuvastatin (CRESTOR) 20 MG tablet; Take  1 tablet (20 mg total) by mouth daily.  Dispense: 90 tablet; Refill: 1 - AMB referral to CHF clinic  9. Benign prostatic hyperplasia with urinary obstruction  - tamsulosin (FLOMAX) 0.4 MG CAPS capsule; Take 1 capsule (0.4 mg total) by mouth every morning.  Dispense: 90 capsule; Refill: 1  10. Vitamin D deficiency  - VITAMIN D 25 Hydroxy (Vit-D Deficiency, Fractures)  11. Hyperglycemia   12. Pure hypercholesterolemia  - Lipid panel  13. History of CVA in adulthood   38. Elevated PSA measurement  - PSA  15. B12 deficiency  - CBC with Differential/Platelet - B12 and Folate Panel  16. Long-term use of high-risk medication  - COMPLETE METABOLIC PANEL WITH GFR  17. Anemia, unspecified type  - Iron, TIBC and Ferritin Panel

## 2023-02-14 ENCOUNTER — Encounter: Payer: Self-pay | Admitting: Family Medicine

## 2023-02-14 ENCOUNTER — Ambulatory Visit (INDEPENDENT_AMBULATORY_CARE_PROVIDER_SITE_OTHER): Payer: Medicare Other | Admitting: Family Medicine

## 2023-02-14 VITALS — BP 118/66 | HR 74 | Resp 16 | Ht 70.0 in | Wt 176.0 lb

## 2023-02-14 DIAGNOSIS — I420 Dilated cardiomyopathy: Secondary | ICD-10-CM | POA: Diagnosis not present

## 2023-02-14 DIAGNOSIS — E559 Vitamin D deficiency, unspecified: Secondary | ICD-10-CM

## 2023-02-14 DIAGNOSIS — E7800 Pure hypercholesterolemia, unspecified: Secondary | ICD-10-CM

## 2023-02-14 DIAGNOSIS — Z79899 Other long term (current) drug therapy: Secondary | ICD-10-CM

## 2023-02-14 DIAGNOSIS — I495 Sick sinus syndrome: Secondary | ICD-10-CM | POA: Diagnosis not present

## 2023-02-14 DIAGNOSIS — N184 Chronic kidney disease, stage 4 (severe): Secondary | ICD-10-CM

## 2023-02-14 DIAGNOSIS — I5022 Chronic systolic (congestive) heart failure: Secondary | ICD-10-CM | POA: Diagnosis not present

## 2023-02-14 DIAGNOSIS — R739 Hyperglycemia, unspecified: Secondary | ICD-10-CM | POA: Diagnosis not present

## 2023-02-14 DIAGNOSIS — E538 Deficiency of other specified B group vitamins: Secondary | ICD-10-CM

## 2023-02-14 DIAGNOSIS — D649 Anemia, unspecified: Secondary | ICD-10-CM

## 2023-02-14 DIAGNOSIS — Z8673 Personal history of transient ischemic attack (TIA), and cerebral infarction without residual deficits: Secondary | ICD-10-CM

## 2023-02-14 DIAGNOSIS — N138 Other obstructive and reflux uropathy: Secondary | ICD-10-CM

## 2023-02-14 DIAGNOSIS — I2510 Atherosclerotic heart disease of native coronary artery without angina pectoris: Secondary | ICD-10-CM | POA: Diagnosis not present

## 2023-02-14 DIAGNOSIS — F319 Bipolar disorder, unspecified: Secondary | ICD-10-CM | POA: Diagnosis not present

## 2023-02-14 DIAGNOSIS — N2581 Secondary hyperparathyroidism of renal origin: Secondary | ICD-10-CM | POA: Diagnosis not present

## 2023-02-14 DIAGNOSIS — I442 Atrioventricular block, complete: Secondary | ICD-10-CM

## 2023-02-14 DIAGNOSIS — R972 Elevated prostate specific antigen [PSA]: Secondary | ICD-10-CM | POA: Diagnosis not present

## 2023-02-14 DIAGNOSIS — N401 Enlarged prostate with lower urinary tract symptoms: Secondary | ICD-10-CM | POA: Diagnosis not present

## 2023-02-14 MED ORDER — CLOPIDOGREL BISULFATE 75 MG PO TABS: 75.0000 mg | ORAL_TABLET | Freq: Every morning | ORAL | 1 refills | Status: DC

## 2023-02-14 MED ORDER — ROSUVASTATIN CALCIUM 20 MG PO TABS: 20.0000 mg | ORAL_TABLET | Freq: Every day | ORAL | 1 refills | Status: DC

## 2023-02-14 MED ORDER — TAMSULOSIN HCL 0.4 MG PO CAPS: 0.4000 mg | ORAL_CAPSULE | Freq: Every morning | ORAL | 1 refills | Status: DC

## 2023-02-15 LAB — CBC WITH DIFFERENTIAL/PLATELET
Absolute Monocytes: 810 cells/uL (ref 200–950)
Basophils Absolute: 57 cells/uL (ref 0–200)
Basophils Relative: 0.7 %
Eosinophils Absolute: 227 cells/uL (ref 15–500)
Eosinophils Relative: 2.8 %
HCT: 36.9 % — ABNORMAL LOW (ref 38.5–50.0)
Hemoglobin: 11.8 g/dL — ABNORMAL LOW (ref 13.2–17.1)
Lymphs Abs: 713 cells/uL — ABNORMAL LOW (ref 850–3900)
MCH: 28 pg (ref 27.0–33.0)
MCHC: 32 g/dL (ref 32.0–36.0)
MCV: 87.4 fL (ref 80.0–100.0)
MPV: 10.2 fL (ref 7.5–12.5)
Monocytes Relative: 10 %
Neutro Abs: 6294 cells/uL (ref 1500–7800)
Neutrophils Relative %: 77.7 %
Platelets: 206 10*3/uL (ref 140–400)
RBC: 4.22 10*6/uL (ref 4.20–5.80)
RDW: 14.9 % (ref 11.0–15.0)
Total Lymphocyte: 8.8 %
WBC: 8.1 10*3/uL (ref 3.8–10.8)

## 2023-02-15 LAB — IRON,TIBC AND FERRITIN PANEL
%SAT: 17 % (calc) — ABNORMAL LOW (ref 20–48)
Ferritin: 119 ng/mL (ref 24–380)
Iron: 62 ug/dL (ref 50–180)
TIBC: 355 mcg/dL (calc) (ref 250–425)

## 2023-02-15 LAB — COMPLETE METABOLIC PANEL WITH GFR
AG Ratio: 1.5 (calc) (ref 1.0–2.5)
ALT: 9 U/L (ref 9–46)
AST: 15 U/L (ref 10–35)
Albumin: 4.3 g/dL (ref 3.6–5.1)
Alkaline phosphatase (APISO): 76 U/L (ref 35–144)
BUN/Creatinine Ratio: 14 (calc) (ref 6–22)
BUN: 43 mg/dL — ABNORMAL HIGH (ref 7–25)
CO2: 20 mmol/L (ref 20–32)
Calcium: 10.3 mg/dL (ref 8.6–10.3)
Chloride: 108 mmol/L (ref 98–110)
Creat: 3.12 mg/dL — ABNORMAL HIGH (ref 0.70–1.28)
Globulin: 2.8 g/dL (calc) (ref 1.9–3.7)
Glucose, Bld: 102 mg/dL — ABNORMAL HIGH (ref 65–99)
Potassium: 4.8 mmol/L (ref 3.5–5.3)
Sodium: 141 mmol/L (ref 135–146)
Total Bilirubin: 0.3 mg/dL (ref 0.2–1.2)
Total Protein: 7.1 g/dL (ref 6.1–8.1)
eGFR: 20 mL/min/{1.73_m2} — ABNORMAL LOW (ref >=60)

## 2023-02-15 LAB — B12 AND FOLATE PANEL
Folate: 8.5 ng/mL
Vitamin B-12: 345 pg/mL (ref 200–1100)

## 2023-02-15 LAB — LIPID PANEL
Cholesterol: 177 mg/dL (ref <200)
HDL: 60 mg/dL (ref >=40)
LDL Cholesterol (Calc): 92 mg/dL (calc)
Non-HDL Cholesterol (Calc): 117 mg/dL (calc) (ref <130)
Total CHOL/HDL Ratio: 3 (calc) (ref <5.0)
Triglycerides: 153 mg/dL — ABNORMAL HIGH (ref <150)

## 2023-02-15 LAB — PTH, INTACT AND CALCIUM
Calcium: 10.3 mg/dL (ref 8.6–10.3)
PTH: 74 pg/mL (ref 16–77)

## 2023-02-15 LAB — PSA: PSA: 9.57 ng/mL — ABNORMAL HIGH (ref <=4.00)

## 2023-02-15 LAB — VITAMIN D 25 HYDROXY (VIT D DEFICIENCY, FRACTURES): Vit D, 25-Hydroxy: 55 ng/mL (ref 30–100)

## 2023-02-16 ENCOUNTER — Telehealth: Payer: Self-pay

## 2023-02-16 ENCOUNTER — Other Ambulatory Visit: Payer: Self-pay

## 2023-02-16 DIAGNOSIS — R972 Elevated prostate specific antigen [PSA]: Secondary | ICD-10-CM

## 2023-02-16 NOTE — Telephone Encounter (Signed)
Called pt to schedule initial appointment.  Per pt spouse, she will call back when ready to schedule the appointment. Clinic phone number provided.

## 2023-02-21 ENCOUNTER — Telehealth: Payer: Self-pay | Admitting: Family Medicine

## 2023-02-21 NOTE — Telephone Encounter (Signed)
Contacted [name] to schedule their annual wellness visit. Appointment made for 02/22/2023.  [facility] Care Guide [facility] AWV TEAM Direct Dial: [phone]

## 2023-02-22 ENCOUNTER — Ambulatory Visit (INDEPENDENT_AMBULATORY_CARE_PROVIDER_SITE_OTHER): Payer: Medicare Other

## 2023-02-22 VITALS — Ht 70.0 in | Wt 176.0 lb

## 2023-02-22 DIAGNOSIS — Z0000 Encounter for general adult medical examination without abnormal findings: Secondary | ICD-10-CM

## 2023-02-22 NOTE — Progress Notes (Signed)
I connected with  Jeffrey Herring on 02/22/23 by a audio enabled telemedicine application and verified that I am speaking with the correct person using two identifiers.  Patient Location: Home  Provider Location: Office/Clinic  I discussed the limitations of evaluation and management by telemedicine. The patient expressed understanding and agreed to proceed.  Subjective:   Jeffrey Herring is a 72 y.o. male who presents for Medicare Annual/Subsequent preventive examination.  Review of Systems     Cardiac Risk Factors include: advanced age (>69men, >13 women);hypertension;sedentary lifestyle;dyslipidemia;male gender     Objective:    Today's Vitals   02/22/23 1033  Weight: 176 lb (79.8 kg)  Height: 5\' 10"  (1.778 m)   Body mass index is 25.25 kg/m.     02/22/2023   10:39 AM 11/01/2021    3:10 PM 11/26/2020   10:16 AM 10/19/2020   10:54 AM 08/10/2019   12:11 PM 05/16/2019   11:56 AM 04/23/2018    1:06 PM  Advanced Directives  Does Patient Have a Medical Advance Directive? Yes No No No Yes No   Type of Estate agent of [place];Living will    Healthcare Power of [place];Living will    Does patient want to make changes to medical advance directive?     No - Patient declined    Would patient like information on creating a medical advance directive?  No - Patient declined  Yes (MAU/Ambulatory/Procedural Areas - Information given) No - Patient declined No - Patient declined      Information is confidential and restricted. Go to Review Flowsheets to unlock data.    Current Medications (verified) Outpatient Encounter Medications as of 02/22/2023  Medication Sig   ALPRAZolam (XANAX) 0.5 MG tablet Take 1 tablet (0.5 mg total) by mouth 3 (three) times daily.   cholecalciferol (VITAMIN D) 1000 units tablet Take 1,000 Units by mouth daily.   clopidogrel (PLAVIX) 75 MG tablet Take 1 tablet (75 mg total) by mouth every morning.   lamoTRIgine (LAMICTAL) 200 MG tablet Take  1 tablet (200 mg total) by mouth daily.   metoprolol succinate (TOPROL-XL) 25 MG 24 hr tablet Take 0.5 tablets by mouth daily.   OLANZapine (ZYPREXA) 5 MG tablet TAKE 1 TABLET BY MOUTH ATBEDTIME.   rosuvastatin (CRESTOR) 20 MG tablet Take 1 tablet (20 mg total) by mouth daily.   tamsulosin (FLOMAX) 0.4 MG CAPS capsule Take 1 capsule (0.4 mg total) by mouth every morning.   No facility-administered encounter medications on file as of 02/22/2023.    Allergies (verified) Aspirin, Vancomycin, Codeine, Demerol [meperidine], and Lithium   History: Past Medical History:  Diagnosis Date   Arrhythmia    Bipolar 1 disorder (HCC)    Depression    Diabetes insipidus (HCC)    History of DVT (deep vein thrombosis)    History of kidney stones    History of pulmonary embolism    Hyperlipemia    Lithium toxicity    Pacemaker    Stage III chronic kidney disease (HCC)    Past Surgical History:  Procedure Laterality Date   BACK SURGERY     CARDIAC SURGERY     COLONOSCOPY WITH PROPOFOL N/A 11/26/2020   Procedure: COLONOSCOPY WITH PROPOFOL;  Surgeon: Pasty Spillers, MD;  Location: [facility] ENDOSCOPY;  Service: Endoscopy;  Laterality: N/A;   HERNIA REPAIR Right 02/05/14   NOSE SURGERY     PACEMAKER PLACEMENT     SPINE SURGERY     Family History  Problem Relation Age  of Onset   Depression Mother    Social History   Socioeconomic History   Marital status: Married    Spouse name: Not on file   Number of children: 2   Years of education: some college   Highest education level: 12th grade  Occupational History    Employer: DISABLED  Tobacco Use   Smoking status: Former    Types: Cigars    Quit date: 02/05/2018    Years since quitting: 5.0    Passive exposure: Past   Smokeless tobacco: Never   Tobacco comments:    smoking cessation materials not required  Vaping Use   Vaping Use: Never used  Substance and Sexual Activity   Alcohol use: No    Alcohol/week: 0.0 standard drinks of  alcohol   Drug use: No   Sexual activity: Never  Other Topics Concern   Not on file  Social History Narrative   Used to be a  Glass blower/designer, 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82   Lives with wife   They have two grown children       Social Determinants of Health   Financial Resource Strain: Low Risk  (02/22/2023)   Overall Financial Resource Strain (CARDIA)    Difficulty of Paying Living Expenses: Not hard at all  Food Insecurity: No Food Insecurity (02/22/2023)   Hunger Vital Sign    Worried About Running Out of Food in the Last Year: Never true    Ran Out of Food in the Last Year: Never true  Transportation Needs: No Transportation Needs (02/22/2023)   PRAPARE - Administrator, Civil Service (Medical): No    Lack of Transportation (Non-Medical): No  Physical Activity: Inactive (02/22/2023)   Exercise Vital Sign    Days of Exercise per Week: 0 days    Minutes of Exercise per Session: 0 min  Stress: No Stress Concern Present (02/22/2023)   [employer] of Occupational Health - Occupational Stress Questionnaire    Feeling of Stress : Not at all  Social Connections: Moderately Isolated (02/22/2023)   Social Connection and Isolation Panel [NHANES]    Frequency of Communication with Friends and Family: More than three times a week    Frequency of Social Gatherings with Friends and Family: Once a week    Attends Religious Services: Never    Database administrator or Organizations: No    Attends Engineer, structural: Never    Marital Status: Married    Tobacco Counseling Counseling given: Not Answered Tobacco comments: smoking cessation materials not required  Clinical Intake:  Pre-visit preparation completed: Yes  Pain : No/denies pain  BMI - recorded: 25.25 Nutritional Status: BMI 25 -29 Overweight Nutritional Risks: None Diabetes: No     Diabetic?no  Interpreter Needed?: No  Comments: lives with  wife Information entered by :: B.Erron Wengert,LPN   Activities of Daily Living    02/22/2023   10:40 AM 02/14/2023   10:04 AM  In your present state of health, do you have any difficulty performing the following activities:  Hearing? 0 0  Vision? 0 0  Difficulty concentrating or making decisions? 1 1  Walking or climbing stairs? 0 0  Dressing or bathing? 0 0  Doing errands, shopping? 0 0  Preparing Food and eating ? N   Using the Toilet? N   In the past six months, have you accidently leaked urine? N   Do you have problems with loss  of bowel control? N   Managing your Medications? N   Managing your Finances? N   Housekeeping or managing your Housekeeping? N     Patient Care Team: Alba Cory, MD as PCP - General (Family Medicine) Mayme Genta, Theotis Barrio, MD as Referring Physician (Cardiology) Lolly Mustache Phillips Grout, MD as Consulting Physician (Psychiatry) Salvatore Marvel, NP as Nurse Practitioner (Cardiology) Sondra Come, MD as Consulting Physician (Urology) Pasty Spillers, MD (Inactive) (Gastroenterology)  Indicate any recent Medical Services you may have received from other than [facility] providers in the past year (date may be approximate).     Assessment:   This is a routine wellness examination for [place].  Hearing/Vision screen Hearing Screening - Comments:: Adequate hearing Vision Screening - Comments:: Adequate Vision Declines referral-has no eye provider  Dietary issues and exercise activities discussed: Current Exercise Habits: The patient does not participate in regular exercise at present, Exercise limited by: cardiac condition(s)   Goals Addressed             This Visit's Progress    DIET - INCREASE WATER INTAKE   Not on track    Recommend to drink at least 6-8 8oz glasses of water per day.     Exercise 3x per week (30 min per time)   Not on track    Recommend walking three times a week for 20-30 minutes.        Depression Screen    02/22/2023    10:37 AM 02/14/2023   10:04 AM 11/13/2022   10:33 AM 02/13/2022    2:30 PM 11/01/2021    3:09 PM 07/08/2021   11:31 AM 01/04/2021   10:56 AM  PHQ 2/9 Scores  PHQ - 2 Score 0 0 1 0 0 0 0  PHQ- 9 Score  0 2 0  2     Fall Risk    02/22/2023   10:36 AM 02/14/2023   10:04 AM 11/13/2022   10:33 AM 08/16/2022   10:55 AM 02/13/2022    2:30 PM  Fall Risk   Falls in the past year? 1 0 0 0 0  Number falls in past yr: 0 0 0  0  Injury with Fall? 0 0 0  0  Risk for fall due to : No Fall Risks No Fall Risks No Fall Risks No Fall Risks No Fall Risks  Follow up Falls prevention discussed;Education provided Falls prevention discussed Falls prevention discussed Falls prevention discussed;Education provided;Falls evaluation completed Falls prevention discussed    FALL RISK PREVENTION PERTAINING TO THE HOME:  Any stairs in or around the home? No  If so, are there any without handrails? No  Home free of Herring throw rugs in walkways, pet beds, electrical cords, etc? Yes  Adequate lighting in your home to reduce risk of falls? Yes   ASSISTIVE DEVICES UTILIZED TO PREVENT FALLS:  Life alert? No  Use of a cane, walker or w/c? No  Grab bars in the bathroom? Yes  Shower chair or bench in shower? No  Elevated toilet seat or a handicapped toilet? No    Cognitive Function:        02/22/2023   10:42 AM 10/19/2020   10:57 AM 05/16/2019   11:59 AM 04/23/2018    1:10 PM 03/26/2017    9:03 AM  6CIT Screen  What Year? 0 points 0 points 0 points    What month? 0 points 0 points 0 points    What time? 0 points 0 points 0 points  Count back from 20 0 points 0 points 0 points    Months in reverse 0 points 0 points 0 points    Repeat phrase 4 points 10 points 6 points    Total Score 4 points 10 points 6 points       Information is confidential and restricted. Go to Review Flowsheets to unlock data.    Immunizations Immunization History  Administered Date(s) Administered   Fluad Quad(high Dose 65+) 07/06/2020,  08/16/2022   Influenza, High Dose Seasonal PF 10/28/2015, 09/20/2016, 08/02/2017, 11/06/2018, 06/05/2019, 07/04/2021   Influenza, Seasonal, Injecte, Preservative Fre 07/16/2013, 05/16/2014, 08/02/2017   Influenza-Unspecified 07/16/2013, 05/16/2014, 10/28/2015, 09/20/2016, 08/02/2017, 11/06/2018   PFIZER(Purple Top)SARS-COV-2 Vaccination 12/12/2019, 01/07/2020, 09/04/2020, 07/04/2021   Pneumococcal Conjugate-13 10/28/2015   Pneumococcal Polysaccharide-23 04/23/2014, 08/06/2019   Tdap 10/27/2005    TDAP status: Up to date  Flu Vaccine status: Up to date  Pneumococcal vaccine status: Up to date  Covid-19 vaccine status: Completed vaccines  Qualifies for Shingles Vaccine? Yes   Zostavax completed No  received #1 Shingrix Completed?: No.    Education has been provided regarding the importance of this vaccine. Patient has been advised to call insurance company to determine out of pocket expense if they have not yet received this vaccine. Advised may also receive vaccine at local pharmacy or Health Dept. Verbalized acceptance and understanding.  Screening Tests Health Maintenance  Topic Date Due   DTaP/Tdap/Td (2 - Td or Tdap) 10/28/2015   COVID-19 Vaccine (5 - 2023-24 season) 06/16/2022   Zoster Vaccines- Shingrix (1 of 2) 05/15/2023 (Originally 03/15/1970)   INFLUENZA VACCINE  05/17/2023   Medicare Annual Wellness (AWV)  02/22/2024   Pneumonia Vaccine 36+ Years old  Completed   Hepatitis C Screening  Completed   HPV VACCINES  Aged Out   Lung Cancer Screening  Discontinued   COLONOSCOPY (Pts 45-65yrs Insurance coverage will need to be confirmed)  Discontinued    Health Maintenance  Health Maintenance Due  Topic Date Due   DTaP/Tdap/Td (2 - Td or Tdap) 10/28/2015   COVID-19 Vaccine (5 - 2023-24 season) 06/16/2022    Colorectal cancer screening: No longer required. discontinued  Lung Cancer Screening: (Low Dose CT Chest recommended if Age 108-80 years, 30 pack-year currently  smoking OR have quit w/in 15years.) does qualify.   Lung Cancer Screening Referral: no pt declines  Additional Screening:  Hepatitis C Screening: does not qualify; Completed yes  Vision Screening: Recommended annual ophthalmology exams for early detection of glaucoma and other disorders of the eye. Is the patient up to date with their annual eye exam?  No  Who is the provider or what is the name of the office in which the patient attends annual eye exams? Has none If pt is not established with a provider, would they like to be referred to a provider to establish care? No . Pt declined  Dental Screening: Recommended annual dental exams for proper oral hygiene  Community Resource Referral / Chronic Care Management: CRR required this visit?  No   CCM required this visit?  No      Plan:     I have personally reviewed and noted the following in the patient's chart:   Medical and social history Use of alcohol, tobacco or illicit drugs  Current medications and supplements including opioid prescriptions. Patient is not currently taking opioid prescriptions. Functional ability and status Nutritional status Physical activity Advanced directives List of other physicians Hospitalizations, surgeries, and ER visits in previous 12 months Vitals  Screenings to include cognitive, depression, and falls Referrals and appointments  In addition, I have reviewed and discussed with patient certain preventive protocols, quality metrics, and best practice recommendations. A written personalized care plan for preventive services as well as general preventive health recommendations were provided to patient.   Sue Lush, LPN   10/21/1094   Nurse Notes: The patient states he is doing well and has no concerns or questions at this time.

## 2023-02-22 NOTE — Patient Instructions (Signed)
Jeffrey Herring , Thank you for taking time to come for your Medicare Wellness Visit. I appreciate your ongoing commitment to your health goals. Please review the following plan we discussed and let me know if I can assist you in the future.   These are the goals we discussed:  Goals      DIET - INCREASE WATER INTAKE     Recommend to drink at least 6-8 8oz glasses of water per day.     Exercise 3x per week (30 min per time)     Recommend walking three times a week for 20-30 minutes.         This is a list of the screening recommended for you and due dates:  Health Maintenance  Topic Date Due   DTaP/Tdap/Td vaccine (2 - Td or Tdap) 10/28/2015   COVID-19 Vaccine (5 - 2023-24 season) 06/16/2022   Zoster (Shingles) Vaccine (1 of 2) 05/15/2023*   Flu Shot  05/17/2023   Medicare Annual Wellness Visit  02/22/2024   Pneumonia Vaccine  Completed   Hepatitis C Screening: USPSTF Recommendation to screen - Ages 18-79 yo.  Completed   HPV Vaccine  Aged Out   Screening for Lung Cancer  Discontinued   Colon Cancer Screening  Discontinued  *Topic was postponed. The date shown is not the original due date.    Advanced directives: yes  Conditions/risks identified: low falls risk      Next appointment: Follow up in one year for your annual wellness visit. 02/28/2024 @10 :30am telephone  Preventive Care 65 Years and Older, Male  Preventive care refers to lifestyle choices and visits with your health care provider that can promote health and wellness. What does preventive care include? A yearly physical exam. This is also called an annual well check. Dental exams once or twice a year. Routine eye exams. Ask your health care provider how often you should have your eyes checked. Personal lifestyle choices, including: Daily care of your teeth and gums. Regular physical activity. Eating a healthy diet. Avoiding tobacco and drug use. Limiting alcohol use. Practicing safe sex. Taking low doses of  aspirin every day. Taking vitamin and mineral supplements as recommended by your health care provider. What happens during an annual well check? The services and screenings done by your health care provider during your annual well check will depend on your age, overall health, lifestyle risk factors, and family history of disease. Counseling  Your health care provider may ask you questions about your: Alcohol use. Tobacco use. Drug use. Emotional well-being. Home and relationship well-being. Sexual activity. Eating habits. History of falls. Memory and ability to understand (cognition). Work and work Astronomer. Screening  You may have the following tests or measurements: Height, weight, and BMI. Blood pressure. Lipid and cholesterol levels. These may be checked every 5 years, or more frequently if you are over 83 years old. Skin check. Lung cancer screening. You may have this screening every year starting at age 28 if you have a 30-pack-year history of smoking and currently smoke or have quit within the past 15 years. Fecal occult blood test (FOBT) of the stool. You may have this test every year starting at age 72. Flexible sigmoidoscopy or colonoscopy. You may have a sigmoidoscopy every 5 years or a colonoscopy every 10 years starting at age 26. Prostate cancer screening. Recommendations will vary depending on your family history and other risks. Hepatitis C blood test. Hepatitis B blood test. Sexually transmitted disease (STD) testing. Diabetes screening. This  is done by checking your blood sugar (glucose) after you have not eaten for a while (fasting). You may have this done every 1-3 years. Abdominal aortic aneurysm (AAA) screening. You may need this if you are a current or former smoker. Osteoporosis. You may be screened starting at age 34 if you are at high risk. Talk with your health care provider about your test results, treatment options, and if necessary, the need for more  tests. Vaccines  Your health care provider may recommend certain vaccines, such as: Influenza vaccine. This is recommended every year. Tetanus, diphtheria, and acellular pertussis (Tdap, Td) vaccine. You may need a Td booster every 10 years. Zoster vaccine. You may need this after age 11. Pneumococcal 13-valent conjugate (PCV13) vaccine. One dose is recommended after age 13. Pneumococcal polysaccharide (PPSV23) vaccine. One dose is recommended after age 42. Talk to your health care provider about which screenings and vaccines you need and how often you need them. This information is not intended to replace advice given to you by your health care provider. Make sure you discuss any questions you have with your health care provider. Document Released: 10/29/2015 Document Revised: 06/21/2016 Document Reviewed: 08/03/2015 Elsevier Interactive Patient Education  2017 [place] Prevention in the Home Falls can cause injuries. They can happen to people of all ages. There are many things you can do to make your home safe and to help prevent falls. What can I do on the outside of my home? Regularly fix the edges of walkways and driveways and fix any cracks. Remove anything that might make you trip as you walk through a door, such as a raised step or threshold. Trim any bushes or trees on the path to your home. Use bright outdoor lighting. Clear any walking paths of anything that might make someone trip, such as rocks or tools. Regularly check to see if handrails are loose or broken. Make sure that both sides of any steps have handrails. Any raised decks and porches should have guardrails on the edges. Have any leaves, snow, or ice cleared regularly. Use sand or salt on walking paths during winter. Clean up any spills in your garage right away. This includes oil or grease spills. What can I do in the bathroom? Use night lights. Install grab bars by the toilet and in the tub and shower.  Do not use towel bars as grab bars. Use non-skid mats or decals in the tub or shower. If you need to sit down in the shower, use a plastic, non-slip stool. Keep the floor dry. Clean up any water that spills on the floor as soon as it happens. Remove soap buildup in the tub or shower regularly. Attach bath mats securely with double-sided non-slip rug tape. Do not have throw rugs and other things on the floor that can make you trip. What can I do in the bedroom? Use night lights. Make sure that you have a light by your bed that is easy to reach. Do not use any sheets or blankets that are too big for your bed. They should not hang down onto the floor. Have a firm chair that has side arms. You can use this for support while you get dressed. Do not have throw rugs and other things on the floor that can make you trip. What can I do in the kitchen? Clean up any spills right away. Avoid walking on wet floors. Keep items that you use a lot in easy-to-reach places. If you  need to reach something above you, use a strong step stool that has a grab bar. Keep electrical cords out of the way. Do not use floor polish or wax that makes floors slippery. If you must use wax, use non-skid floor wax. Do not have throw rugs and other things on the floor that can make you trip. What can I do with my stairs? Do not leave any items on the stairs. Make sure that there are handrails on both sides of the stairs and use them. Fix handrails that are broken or loose. Make sure that handrails are as long as the stairways. Check any carpeting to make sure that it is firmly attached to the stairs. Fix any carpet that is loose or worn. Avoid having throw rugs at the top or bottom of the stairs. If you do have throw rugs, attach them to the floor with carpet tape. Make sure that you have a light switch at the top of the stairs and the bottom of the stairs. If you do not have them, ask someone to add them for you. What else  can I do to help prevent falls? Wear shoes that: Do not have high heels. Have rubber bottoms. Are comfortable and fit you well. Are closed at the toe. Do not wear sandals. If you use a stepladder: Make sure that it is fully opened. Do not climb a closed stepladder. Make sure that both sides of the stepladder are locked into place. Ask someone to hold it for you, if possible. Clearly mark and make sure that you can see: Any grab bars or handrails. First and last steps. Where the edge of each step is. Use tools that help you move around (mobility aids) if they are needed. These include: Canes. Walkers. Scooters. Crutches. Turn on the lights when you go into a dark area. Replace any light bulbs as soon as they burn out. Set up your furniture so you have a clear path. Avoid moving your furniture around. If any of your floors are uneven, fix them. If there are any pets around you, be aware of where they are. Review your medicines with your doctor. Some medicines can make you feel dizzy. This can increase your chance of falling. Ask your doctor what other things that you can do to help prevent falls. This information is not intended to replace advice given to you by your health care provider. Make sure you discuss any questions you have with your health care provider. Document Released: 07/29/2009 Document Revised: 03/09/2016 Document Reviewed: 11/06/2014 Elsevier Interactive Patient Education  2017 [employer].

## 2023-03-28 ENCOUNTER — Telehealth: Payer: Self-pay

## 2023-03-28 NOTE — Telephone Encounter (Signed)
Spoke with the patients wife to schedule patient for new patient appointment from referral from Dr. Lillia Abed. Patient is scheduled for 04/18/2023

## 2023-04-17 DIAGNOSIS — I442 Atrioventricular block, complete: Secondary | ICD-10-CM | POA: Diagnosis not present

## 2023-04-17 DIAGNOSIS — Z950 Presence of cardiac pacemaker: Secondary | ICD-10-CM | POA: Diagnosis not present

## 2023-04-18 ENCOUNTER — Ambulatory Visit: Payer: Medicare Other | Attending: Family | Admitting: Family

## 2023-04-18 ENCOUNTER — Encounter: Payer: Self-pay | Admitting: Family

## 2023-04-18 VITALS — BP 118/71 | HR 59 | Ht 70.0 in | Wt 177.0 lb

## 2023-04-18 DIAGNOSIS — Z86718 Personal history of other venous thrombosis and embolism: Secondary | ICD-10-CM | POA: Diagnosis not present

## 2023-04-18 DIAGNOSIS — I10 Essential (primary) hypertension: Secondary | ICD-10-CM | POA: Diagnosis not present

## 2023-04-18 DIAGNOSIS — R419 Unspecified symptoms and signs involving cognitive functions and awareness: Secondary | ICD-10-CM | POA: Insufficient documentation

## 2023-04-18 DIAGNOSIS — I2510 Atherosclerotic heart disease of native coronary artery without angina pectoris: Secondary | ICD-10-CM | POA: Insufficient documentation

## 2023-04-18 DIAGNOSIS — Z950 Presence of cardiac pacemaker: Secondary | ICD-10-CM | POA: Diagnosis not present

## 2023-04-18 DIAGNOSIS — N400 Enlarged prostate without lower urinary tract symptoms: Secondary | ICD-10-CM | POA: Insufficient documentation

## 2023-04-18 DIAGNOSIS — I495 Sick sinus syndrome: Secondary | ICD-10-CM | POA: Insufficient documentation

## 2023-04-18 DIAGNOSIS — Z8673 Personal history of transient ischemic attack (TIA), and cerebral infarction without residual deficits: Secondary | ICD-10-CM | POA: Diagnosis not present

## 2023-04-18 DIAGNOSIS — Z87891 Personal history of nicotine dependence: Secondary | ICD-10-CM | POA: Insufficient documentation

## 2023-04-18 DIAGNOSIS — I442 Atrioventricular block, complete: Secondary | ICD-10-CM | POA: Diagnosis not present

## 2023-04-18 DIAGNOSIS — E785 Hyperlipidemia, unspecified: Secondary | ICD-10-CM | POA: Insufficient documentation

## 2023-04-18 DIAGNOSIS — Z7902 Long term (current) use of antithrombotics/antiplatelets: Secondary | ICD-10-CM | POA: Diagnosis not present

## 2023-04-18 DIAGNOSIS — Z79899 Other long term (current) drug therapy: Secondary | ICD-10-CM | POA: Insufficient documentation

## 2023-04-18 DIAGNOSIS — Z955 Presence of coronary angioplasty implant and graft: Secondary | ICD-10-CM | POA: Insufficient documentation

## 2023-04-18 DIAGNOSIS — F319 Bipolar disorder, unspecified: Secondary | ICD-10-CM | POA: Diagnosis not present

## 2023-04-18 DIAGNOSIS — N183 Chronic kidney disease, stage 3 unspecified: Secondary | ICD-10-CM | POA: Diagnosis not present

## 2023-04-18 DIAGNOSIS — Z86711 Personal history of pulmonary embolism: Secondary | ICD-10-CM | POA: Diagnosis not present

## 2023-04-18 DIAGNOSIS — I5022 Chronic systolic (congestive) heart failure: Secondary | ICD-10-CM | POA: Insufficient documentation

## 2023-04-18 DIAGNOSIS — I132 Hypertensive heart and chronic kidney disease with heart failure and with stage 5 chronic kidney disease, or end stage renal disease: Secondary | ICD-10-CM | POA: Insufficient documentation

## 2023-04-18 MED ORDER — TORSEMIDE 20 MG PO TABS: 20.0000 mg | ORAL_TABLET | ORAL | 0 refills | Status: DC

## 2023-04-18 NOTE — Patient Instructions (Addendum)
Take 1 tablet of torsemide every other day

## 2023-04-18 NOTE — Progress Notes (Signed)
Advanced Heart Failure Clinic Note   PCP: Alba Cory, MD (last seen 05/24) PCP-Cardiologist: Salvatore Marvel, MD (last seen 04/24)  HPI:  Jeffrey Herring is a 72 y/o male with a history of sinoatrial node dysfunction, CAD with stent in 2014, bipolar, BPH, HTN, cognitive dysfunction, CVA, CKD, hyperlipidemia, PE, endocarditis and chronic heart failure.  He has a pacemaker.  First one in 1983, had some vegetation on pacemaker leads back in 2009 and sees Dr. Mayme Genta at [facility]. Pacemaker was replaced again 01/2018   Last pacemaker removed due to battery replacement done 10/03/2022.  Was in the ED 10/30/22 due to N/V. Had lengthy admission 12/23 with severe drug rash with 80% body surface area covered with associate rhabdomyolysis. AKI with creatinine 5.9. Rash felt to be due to vancomycin. Needed dialysis.   Echo 12/12/21: EF 30-35% along with moderate Jeffrey and mild/ moderate TR. Echo 10/11/22: EF 35-40% along with trace Jeffrey.   Stress test 01/23/23: 1) SPECT Tc16m Cardiolite stress myocardial perfusion imaging showed a moderate sized, severely intense fixed defect of the inferoseptal wall, suggestive of prior infarct with no reversible ischemia  2) There was no TID.   3) The LV ejection fraction was calculated post stress at 37 %   He presents today for his initial visit with a chief complaint of moderate SOB with minimal exertion. Chronic in nature. Has associated fatigue and possible abdominal distention along with this. Denies chest pain, palpitations, cough, pedal edema, dizziness or difficulty sleeping along with this.   Wife reports that patient stays in the bed "22 hours of the day". Patient doesn't provide much history but does answer the questions. Wife provides the majority of the history.   Review of Systems: [y] = yes, [ ]  = no   General: Weight gain [ ] ; Weight loss [ ] ; Anorexia [ ] ; Fatigue [ y]; Fever [ ] ; Chills [ ] ; Weakness [ ]   Cardiac: Chest pain/pressure [ ] ; Resting  SOB [ ] ; Exertional SOB [ y]; Orthopnea [ ] ; Pedal Edema [ ] ; Palpitations [ ] ; Syncope [ ] ; Presyncope [ ] ; Paroxysmal nocturnal dyspnea[ ]   Pulmonary: Cough [ ] ; Wheezing[ ] ; Hemoptysis[ ] ; Sputum [ ] ; Snoring [ ]   GI: Vomiting[ ] ; Dysphagia[ ] ; Melena[ ] ; Hematochezia [ ] ; Heartburn[ ] ; Abdominal pain [ ] ; Constipation [ ] ; Diarrhea [ ] ; BRBPR [ ]   GU: Hematuria[ ] ; Dysuria [ ] ; Nocturia[ ]   Vascular: Pain in legs with walking [ ] ; Pain in feet with lying flat [ ] ; Non-healing sores [ ] ; Stroke [ y]; TIA [ ] ; Slurred speech [ ] ;  Neuro: Headaches[ ] ; Vertigo[ ] ; Seizures[ ] ; Paresthesias[ ] ;Blurred vision [ ] ; Diplopia [ ] ; Vision changes [ ]   Ortho/Skin: Arthritis [ ] ; Joint pain [ ] ; Muscle pain [ ] ; Joint swelling [ ] ; Back Pain [ ] ; Rash [ ]   Psych: Depression[ ] ; Anxiety[ y]  Heme: Bleeding problems [ ] ; Clotting disorders [ ] ; Anemia [ ]   Endocrine: Diabetes [ ] ; Thyroid dysfunction[ ]    Past Medical History:  Diagnosis Date   Arrhythmia    Bipolar 1 disorder (HCC)    Depression    Diabetes insipidus (HCC)    History of DVT (deep vein thrombosis)    History of kidney stones    History of pulmonary embolism    Hyperlipemia    Lithium toxicity    Pacemaker    Stage III chronic kidney disease (HCC)     Current Outpatient Medications  Medication Sig Dispense  Refill   ALPRAZolam (XANAX) 0.5 MG tablet Take 1 tablet (0.5 mg total) by mouth 3 (three) times daily. 90 tablet 5   cholecalciferol (VITAMIN D) 1000 units tablet Take 1,000 Units by mouth daily.     clopidogrel (PLAVIX) 75 MG tablet Take 1 tablet (75 mg total) by mouth every morning. 90 tablet 1   lamoTRIgine (LAMICTAL) 200 MG tablet Take 1 tablet (200 mg total) by mouth daily. 90 tablet 1   metoprolol succinate (TOPROL-XL) 25 MG 24 hr tablet Take 0.5 tablets by mouth daily.     OLANZapine (ZYPREXA) 5 MG tablet TAKE 1 TABLET BY MOUTH ATBEDTIME. 90 tablet 1   rosuvastatin (CRESTOR) 20 MG tablet Take 1 tablet (20 mg total)  by mouth daily. 90 tablet 1   tamsulosin (FLOMAX) 0.4 MG CAPS capsule Take 1 capsule (0.4 mg total) by mouth every morning. 90 capsule 1   No current facility-administered medications for this visit.    Allergies  Allergen Reactions   Aspirin Hives, Shortness Of Breath and Palpitations   Vancomycin Rash    See ID note from 11/24/2008, with subsequent severe relapse December 2023 following a single dose of vanc   Codeine Nausea And Vomiting   Demerol [Meperidine] Nausea And Vomiting   Lithium      acute renal failure      Social History   Socioeconomic History   Marital status: Married    Spouse name: Not on file   Number of children: 2   Years of education: some college   Highest education level: 12th grade  Occupational History    Employer: DISABLED  Tobacco Use   Smoking status: Former    Types: Cigars    Quit date: 02/05/2018    Years since quitting: 5.2    Passive exposure: Past   Smokeless tobacco: Never   Tobacco comments:    smoking cessation materials not required  Vaping Use   Vaping Use: Never used  Substance and Sexual Activity   Alcohol use: No    Alcohol/week: 0.0 standard drinks of alcohol   Drug use: No   Sexual activity: Never  Other Topics Concern   Not on file  Social History Narrative   Used to be a  Glass blower/designer, 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30   Lives with wife   They have two grown children       Social Determinants of Health   Financial Resource Strain: Low Risk  (02/22/2023)   Overall Financial Resource Strain (CARDIA)    Difficulty of Paying Living Expenses: Not hard at all  Food Insecurity: No Food Insecurity (02/22/2023)   Hunger Vital Sign    Worried About Running Out of Food in the Last Year: Never true    Ran Out of Food in the Last Year: Never true  Transportation Needs: No Transportation Needs (02/22/2023)   PRAPARE - Administrator, Civil Service (Medical): No    Lack of  Transportation (Non-Medical): No  Physical Activity: Inactive (02/22/2023)   Exercise Vital Sign    Days of Exercise per Week: 0 days    Minutes of Exercise per Session: 0 min  Stress: No Stress Concern Present (02/22/2023)   [employer] of Occupational Health - Occupational Stress Questionnaire    Feeling of Stress : Not at all  Social Connections: Moderately Isolated (02/22/2023)   Social Connection and Isolation Panel [NHANES]    Frequency of Communication with Friends and  Family: More than three times a week    Frequency of Social Gatherings with Friends and Family: Once a week    Attends Religious Services: Never    Database administrator or Organizations: No    Attends Banker Meetings: Never    Marital Status: Married  Catering manager Violence: Not At Risk (02/22/2023)   Humiliation, Afraid, Rape, and Kick questionnaire    Fear of Current or Ex-Partner: No    Emotionally Abused: No    Physically Abused: No    Sexually Abused: No      Family History  Problem Relation Age of Onset   Depression Mother    Vitals:   04/18/23 1048  BP: 118/71  Pulse: (!) 59  SpO2: 97%  Weight: 177 lb (80.3 kg)  Height: 5\' 10"  (1.778 m)   Wt Readings from Last 3 Encounters:  04/18/23 177 lb (80.3 kg)  02/22/23 176 lb (79.8 kg)  02/14/23 176 lb (79.8 kg)   Lab Results  Component Value Date   CREATININE 3.12 (H) 02/14/2023   CREATININE 2.92 (H) 10/30/2022   CREATININE 3.29 (H) 02/13/2022   PHYSICAL EXAM: General:  Well appearing. No respiratory difficulty HEENT: normal Neck: supple. no JVD. No lymphadenopathy or thyromegaly appreciated. Cor: PMI nondisplaced. Regular rate & rhythm. No rubs, gallops or murmurs. Lungs: clear Abdomen: soft, nontender, distended. No hepatosplenomegaly. No bruits or masses.  Extremities: no cyanosis, clubbing, rash, edema Neuro: alert & oriented x 3, cranial nerves grossly intact. moves all 4 extremities w/o difficulty. Affect  pleasant.  ECG: not done   ASSESSMENT & PLAN:  1: Chronic ischemic heart failure with reduced ejection fraction- - NYHA class III - fluid overloaded with increased symptoms and abdominal distention - instructed to begin weighing daily and call for overnight weight gain of > 2 pounds or a weekly weight gain of > 5 pounds - Echo 12/12/21: EF 30-35% along with moderate Jeffrey and mild/ moderate TR.  - Echo 10/11/22: EF 35-40% along with trace Jeffrey. - continue metoprolol succinate 25mg  daily - give torsemide 20mg  every other day X 3 doses - concerned about renal function worsening but explained that renal function may worsen due to fluid retention - BMP next week - discussed possible jardiance if GFR stabilizes; wife says they will need assistance with this if he starts to take it - BNP 10/10/22 was 6434  2: HTN- - BP 118/71 - saw PCP Carlynn Purl) 05/24 - BMP 02/14/23 showed sodium 141, potassium 4.8, creatinine 3.12 and GFR 20 - saw nephrology pre covid  3: CAD- - saw cardiology (Thongteum) 03/24 - CTO of RCA in 2014 -Stress test 01/23/23: 1) SPECT Tc39m Cardiolite stress myocardial perfusion imaging showed a moderate sized, severely intense fixed defect of the inferoseptal wall, suggestive of prior infarct with no reversible ischemia  2) There was no TID.   3) The LV ejection fraction was calculated post stress at 37 %   4: CHB- - abdominal pacemaker implantation   Return in 2 weeks, sooner if needed.   Delma Freeze, FNP 04/18/23

## 2023-04-26 ENCOUNTER — Telehealth: Payer: Self-pay

## 2023-04-26 ENCOUNTER — Other Ambulatory Visit
Admission: RE | Admit: 2023-04-26 | Discharge: 2023-04-26 | Disposition: A | Discharge status: Home / Self Care | Payer: Medicare Other | Source: Ambulatory Visit | Attending: Family | Admitting: Family

## 2023-04-26 DIAGNOSIS — I5022 Chronic systolic (congestive) heart failure: Secondary | ICD-10-CM | POA: Insufficient documentation

## 2023-04-26 LAB — BASIC METABOLIC PANEL
Anion gap: 10 (ref 5–15)
BUN: 38 mg/dL — ABNORMAL HIGH (ref 8–23)
CO2: 20 mmol/L — ABNORMAL LOW (ref 22–32)
Calcium: 10.2 mg/dL (ref 8.9–10.3)
Chloride: 111 mmol/L (ref 98–111)
Creatinine, Ser: 3.2 mg/dL — ABNORMAL HIGH (ref 0.61–1.24)
GFR, Estimated: 20 mL/min — ABNORMAL LOW (ref >60)
Glucose, Bld: 103 mg/dL — ABNORMAL HIGH (ref 70–99)
Potassium: 3.7 mmol/L (ref 3.5–5.1)
Sodium: 141 mmol/L (ref 135–145)

## 2023-04-26 NOTE — Telephone Encounter (Signed)
-----   Message from Nurse Sheryn Bison sent at 04/26/2023  1:39 PM EDT -----  ----- Message ----- From: Delma Freeze, FNP Sent: 04/26/2023  12:59 PM EDT To: [facility] Triage  Kidney function worsened a little after taking the 3 doses of torsemide. Recheck this in 7-10 days.

## 2023-05-02 ENCOUNTER — Encounter: Payer: Self-pay | Admitting: Family

## 2023-05-02 ENCOUNTER — Other Ambulatory Visit
Admission: RE | Admit: 2023-05-02 | Discharge: 2023-05-02 | Disposition: A | Discharge status: Home / Self Care | Payer: Medicare Other | Source: Ambulatory Visit | Attending: Family | Admitting: Family

## 2023-05-02 ENCOUNTER — Ambulatory Visit: Payer: Medicare Other | Admitting: Family

## 2023-05-02 VITALS — BP 140/68 | HR 60 | Wt 179.0 lb

## 2023-05-02 DIAGNOSIS — I442 Atrioventricular block, complete: Secondary | ICD-10-CM | POA: Diagnosis not present

## 2023-05-02 DIAGNOSIS — I2510 Atherosclerotic heart disease of native coronary artery without angina pectoris: Secondary | ICD-10-CM

## 2023-05-02 DIAGNOSIS — I5022 Chronic systolic (congestive) heart failure: Secondary | ICD-10-CM

## 2023-05-02 DIAGNOSIS — I10 Essential (primary) hypertension: Secondary | ICD-10-CM | POA: Diagnosis not present

## 2023-05-02 LAB — BASIC METABOLIC PANEL
Anion gap: 10 (ref 5–15)
BUN: 33 mg/dL — ABNORMAL HIGH (ref 8–23)
CO2: 17 mmol/L — ABNORMAL LOW (ref 22–32)
Calcium: 9.9 mg/dL (ref 8.9–10.3)
Chloride: 111 mmol/L (ref 98–111)
Creatinine, Ser: 3.01 mg/dL — ABNORMAL HIGH (ref 0.61–1.24)
GFR, Estimated: 21 mL/min — ABNORMAL LOW (ref >60)
Glucose, Bld: 108 mg/dL — ABNORMAL HIGH (ref 70–99)
Potassium: 3.8 mmol/L (ref 3.5–5.1)
Sodium: 138 mmol/L (ref 135–145)

## 2023-05-02 NOTE — Progress Notes (Signed)
ZOX:WRUEAV, Danna Hefty, MD (last seen 05/24) Primary Cardiologist: Salvatore Marvel, MD (last seen 04/24)  HPI:   Mr Hearne is a 72 y/o male with a history of sinoatrial node dysfunction, atrial flutter (ablation 01/04/23), CAD with stent in 2014, bipolar, BPH, HTN, cognitive dysfunction, CVA, CKD, hyperlipidemia, PE, endocarditis and chronic heart failure.   In the past, he had unsuccessful attempt at transvenous BiV ICD implantation due to occluded SVC and did not meet the criteria for a subcutaneous ICD. He had a generator change on October 06, 2022.   He has a pacemaker.  First one in 1983, had some vegetation on pacemaker leads back in 2009 and sees Dr. Mayme Genta at [facility]. Pacemaker was replaced again 01/2018   Last pacemaker removed due to battery replacement done 10/03/2022.   Was in the ED 10/30/22 due to N/V. Had lengthy admission 12/23 with severe drug rash with 80% body surface area covered with associate rhabdomyolysis. AKI with creatinine 5.9. Rash felt to be due to vancomycin. Needed dialysis.   Echo 12/12/21: EF 30-35% along with moderate MR and mild/ moderate TR. Echo 10/11/22: EF 35-40% along with trace MR.   Stress test 01/23/23: 1) SPECT Tc34m Cardiolite stress myocardial perfusion imaging showed a moderate sized, severely intense fixed defect of the inferoseptal wall, suggestive of prior infarct with no reversible ischemia  2) There was no TID.   3) The LV ejection fraction was calculated post stress at 37 %   He presents today for a HF follow-up visit with a chief complaint of  moderate fatigue with minimal exertion. Chronic in nature. Has associated SOB and intermittent dizziness along with this. Denies chest pain, cough, palpitations, pedal edema or difficulty sleeping  Since last visit here, he has taken 3 doses of torsemide with subsequent slight worsening of renal function. Wife feels like patient did urinate more when he took the torsemide.   Wife reports that  patient stays in the bed "22 hours of the day". Patient doesn't provide much history but does answer the questions. Wife provides the majority of the history.   ROS: All systems negative except as listed in HPI, PMH and Problem List.  SH:  Social History   Socioeconomic History   Marital status: Married    Spouse name: Not on file   Number of children: 2   Years of education: some college   Highest education level: 12th grade  Occupational History    Employer: DISABLED  Tobacco Use   Smoking status: Former    Types: Cigars    Quit date: 02/05/2018    Years since quitting: 5.2    Passive exposure: Past   Smokeless tobacco: Never   Tobacco comments:    smoking cessation materials not required  Vaping Use   Vaping status: Never Used  Substance and Sexual Activity   Alcohol use: No    Alcohol/week: 0.0 standard drinks of alcohol   Drug use: No   Sexual activity: Never  Other Topics Concern   Not on file  Social History Narrative   Used to be a  Glass blower/designer, 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68   Lives with wife   They have two grown children       Social Determinants of Health   Financial Resource Strain: Low Risk  (02/22/2023)   Overall Financial Resource Strain (CARDIA)    Difficulty of Paying Living Expenses: Not hard at all  Food Insecurity: No Food Insecurity (  02/22/2023)   Hunger Vital Sign    Worried About Running Out of Food in the Last Year: Never true    Ran Out of Food in the Last Year: Never true  Transportation Needs: No Transportation Needs (02/22/2023)   PRAPARE - Administrator, Civil Service (Medical): No    Lack of Transportation (Non-Medical): No  Physical Activity: Inactive (02/22/2023)   Exercise Vital Sign    Days of Exercise per Week: 0 days    Minutes of Exercise per Session: 0 min  Stress: No Stress Concern Present (02/22/2023)   [employer] of Occupational Health - Occupational Stress  Questionnaire    Feeling of Stress : Not at all  Social Connections: Moderately Isolated (02/22/2023)   Social Connection and Isolation Panel [NHANES]    Frequency of Communication with Friends and Family: More than three times a week    Frequency of Social Gatherings with Friends and Family: Once a week    Attends Religious Services: Never    Database administrator or Organizations: No    Attends Banker Meetings: Never    Marital Status: Married  Catering manager Violence: Not At Risk (02/22/2023)   Humiliation, Afraid, Rape, and Kick questionnaire    Fear of Current or Ex-Partner: No    Emotionally Abused: No    Physically Abused: No    Sexually Abused: No    FH:  Family History  Problem Relation Age of Onset   Depression Mother     Past Medical History:  Diagnosis Date   Arrhythmia    Bipolar 1 disorder (HCC)    Depression    Diabetes insipidus (HCC)    History of DVT (deep vein thrombosis)    History of kidney stones    History of pulmonary embolism    Hyperlipemia    Lithium toxicity    Pacemaker    Stage III chronic kidney disease (HCC)     Current Outpatient Medications  Medication Sig Dispense Refill   ALPRAZolam (XANAX) 0.5 MG tablet Take 1 tablet (0.5 mg total) by mouth 3 (three) times daily. 90 tablet 5   cholecalciferol (VITAMIN D) 1000 units tablet Take 1,000 Units by mouth daily.     clopidogrel (PLAVIX) 75 MG tablet Take 1 tablet (75 mg total) by mouth every morning. 90 tablet 1   cyanocobalamin (VITAMIN B12) 1000 MCG tablet Take 1,000 mcg by mouth daily.     lamoTRIgine (LAMICTAL) 200 MG tablet Take 1 tablet (200 mg total) by mouth daily. 90 tablet 1   metoprolol succinate (TOPROL-XL) 25 MG 24 hr tablet Take 25 mg by mouth daily. Wife reports pt now taking 1 full tablet     OLANZapine (ZYPREXA) 5 MG tablet TAKE 1 TABLET BY MOUTH ATBEDTIME. 90 tablet 1   rosuvastatin (CRESTOR) 20 MG tablet Take 1 tablet (20 mg total) by mouth daily. 90 tablet 1    tamsulosin (FLOMAX) 0.4 MG CAPS capsule Take 1 capsule (0.4 mg total) by mouth every morning. 90 capsule 1   torsemide (DEMADEX) 20 MG tablet Take 1 tablet (20 mg total) by mouth every other day. 3 tablet 0   No current facility-administered medications for this visit.   Vitals:   05/02/23 1050  BP: (!) 140/68  Pulse: 60  SpO2: 97%  Weight: 179 lb (81.2 kg)   Wt Readings from Last 3 Encounters:  05/02/23 179 lb (81.2 kg)  04/18/23 177 lb (80.3 kg)  02/22/23 176 lb (79.8 kg)  Lab Results  Component Value Date   CREATININE 3.20 (H) 04/26/2023   CREATININE 3.12 (H) 02/14/2023   CREATININE 2.92 (H) 10/30/2022   PHYSICAL EXAM:  General:  Well appearing. No resp difficulty HEENT: normal Neck: supple. JVP flat. No lymphadenopathy or thryomegaly appreciated. Cor: PMI normal. Regular rate & rhythm. No rubs, gallops or murmurs. Lungs: clear Abdomen: soft, nontender, nondistended. No hepatosplenomegaly. No bruits or masses.  Extremities: no cyanosis, clubbing, rash, edema Neuro: alert & oriented x3, cranial nerves grossly intact. Moves all 4 extremities w/o difficulty. Affect pleasant.   ECG: not done   ASSESSMENT & PLAN:  1: Chronic ischemic heart failure with reduced ejection fraction- - NYHA class III - euvolemic - weighing daily; reminded to call for overnight weight gain of > 2 pounds or a weekly weight gain of > 5 pounds - weight up 2 pounds from last visit here 2 weeks ago - Echo 12/12/21: EF 30-35% along with moderate MR and mild/ moderate TR.  - Echo 10/11/22: EF 35-40% along with trace MR. - continue metoprolol succinate 25mg  daily - took torsemide 20mg  every other day X 3 doses with slight worsening of renal function - BMP today - discussed possible jardiance if GFR stabilizes; wife says they will need assistance with this if he starts to take it - if jardiance gets started, will check BMP in 2 weeks & then, again, at next OV - BNP 10/10/22 was 6434  2:  HTN- - BP 140/68 - saw PCP Carlynn Purl) 05/24 - BMP 04/26/23 showed sodium 141, potassium 3.7, creatinine 3.2 and GFR 20 - BMP today - saw nephrology pre covid  3: CAD- - saw cardiology (Thongteum) 03/24 - CTO of RCA in 2014 -Stress test 01/23/23: 1) SPECT Tc52m Cardiolite stress myocardial perfusion imaging showed a moderate sized, severely intense fixed defect of the inferoseptal wall, suggestive of prior infarct with no reversible ischemia  2) There was no TID.   3) The LV ejection fraction was calculated post stress at 37 %   4: CHB- - abdominal pacemaker implantation   Return in 1 month, sooner if needed.

## 2023-05-03 ENCOUNTER — Encounter: Payer: Self-pay | Admitting: Family

## 2023-05-03 ENCOUNTER — Telehealth (HOSPITAL_COMMUNITY): Payer: Self-pay

## 2023-05-03 ENCOUNTER — Other Ambulatory Visit (HOSPITAL_COMMUNITY): Payer: Self-pay

## 2023-05-03 NOTE — Telephone Encounter (Signed)
Advanced Heart Failure Patient Advocate Encounter  Application for Jardiance faxed to [facility] on 05/03/2023. Application form attached to patient chart.  Burnell Blanks, CPhT Rx Patient Advocate Phone: [phone]

## 2023-05-07 NOTE — Telephone Encounter (Signed)
[employer] requested clarification of application, provided information by phone.

## 2023-05-07 NOTE — Telephone Encounter (Signed)
Patient was approved to receive Jardiance from [facility] Effective 05/04/2023 to 10/16/2023

## 2023-05-09 DIAGNOSIS — I420 Dilated cardiomyopathy: Secondary | ICD-10-CM | POA: Diagnosis not present

## 2023-05-09 DIAGNOSIS — I10 Essential (primary) hypertension: Secondary | ICD-10-CM | POA: Diagnosis not present

## 2023-05-09 DIAGNOSIS — I442 Atrioventricular block, complete: Secondary | ICD-10-CM | POA: Diagnosis not present

## 2023-05-09 DIAGNOSIS — I2510 Atherosclerotic heart disease of native coronary artery without angina pectoris: Secondary | ICD-10-CM | POA: Diagnosis not present

## 2023-05-09 DIAGNOSIS — I483 Typical atrial flutter: Secondary | ICD-10-CM | POA: Diagnosis not present

## 2023-06-04 ENCOUNTER — Other Ambulatory Visit: Payer: Self-pay

## 2023-06-04 ENCOUNTER — Encounter: Payer: Self-pay | Admitting: Family

## 2023-06-04 ENCOUNTER — Ambulatory Visit: Payer: Medicare Other | Attending: Family | Admitting: Family

## 2023-06-04 VITALS — BP 135/73 | HR 87 | Ht 70.0 in | Wt 177.0 lb

## 2023-06-04 DIAGNOSIS — I5022 Chronic systolic (congestive) heart failure: Secondary | ICD-10-CM | POA: Diagnosis not present

## 2023-06-04 DIAGNOSIS — Z950 Presence of cardiac pacemaker: Secondary | ICD-10-CM | POA: Diagnosis not present

## 2023-06-04 DIAGNOSIS — I442 Atrioventricular block, complete: Secondary | ICD-10-CM | POA: Diagnosis not present

## 2023-06-04 DIAGNOSIS — I110 Hypertensive heart disease with heart failure: Secondary | ICD-10-CM | POA: Diagnosis not present

## 2023-06-04 DIAGNOSIS — I2510 Atherosclerotic heart disease of native coronary artery without angina pectoris: Secondary | ICD-10-CM | POA: Insufficient documentation

## 2023-06-04 DIAGNOSIS — I10 Essential (primary) hypertension: Secondary | ICD-10-CM

## 2023-06-04 LAB — BASIC METABOLIC PANEL
BUN/Creatinine Ratio: 8 — ABNORMAL LOW (ref 10–24)
BUN: 28 mg/dL — ABNORMAL HIGH (ref 8–27)
CO2: 18 mmol/L — ABNORMAL LOW (ref 20–29)
Calcium: 11.1 mg/dL — ABNORMAL HIGH (ref 8.6–10.2)
Chloride: 104 mmol/L (ref 96–106)
Creatinine, Ser: 3.39 mg/dL — ABNORMAL HIGH (ref 0.76–1.27)
Glucose: 83 mg/dL (ref 70–99)
Potassium: 4.4 mmol/L (ref 3.5–5.2)
Sodium: 141 mmol/L (ref 134–144)
eGFR: 18 mL/min/{1.73_m2} — ABNORMAL LOW (ref >59)

## 2023-06-04 NOTE — Patient Instructions (Signed)
Good to see you today!  

## 2023-06-04 NOTE — Progress Notes (Signed)
ION:GEXBMW, Danna Hefty, MD (last seen 05/24) Primary Cardiologist: Salvatore Marvel, MD (last seen 07/24)  HPI:   Mr Whiteaker is a 72 y/o male with a history of sinoatrial node dysfunction, atrial flutter (ablation 01/04/23), CAD with stent in 2014, bipolar, BPH, HTN, cognitive dysfunction, CVA, CKD, hyperlipidemia, PE, previous endocarditis and chronic heart failure.   In the past, he had unsuccessful attempt at transvenous BiV ICD implantation due to occluded SVC and did not meet the criteria for a subcutaneous ICD. He had a generator change on October 06, 2022.   He has a pacemaker. First one in 1983, had some vegetation on pacemaker leads back in 2009 and sees Dr. Mayme Genta at [facility]. Pacemaker was replaced again 01/2018   Last pacemaker removed due to battery replacement done 10/03/2022.  Was in the ED 10/30/22 due to N/V. Had lengthy admission 12/23 with severe drug rash with 80% body surface area covered with associate rhabdomyolysis. AKI with creatinine 5.9. Rash felt to be due to vancomycin. Needed dialysis.   Echo 12/12/21: EF 30-35% along with moderate MR and mild/ moderate TR.   Echo 10/11/22: EF 35-40% along with trace MR.  Stress test 01/23/23: 1) SPECT Tc61m Cardiolite stress myocardial perfusion imaging showed a moderate sized, severely intense fixed defect of the inferoseptal wall, suggestive of prior infarct with no reversible ischemia  2) There was no TID.   3) The LV ejection fraction was calculated post stress at 37 %   He presents today for a HF follow-up visit with a chief complaint of moderate fatigue with minimal exertion. Chronic in nature. Has associated SOB along with this. Denies chest pain, cough, palpitations, pedal edema, dizziness or difficulty sleeping.   Wife reports that patient stays in the bed "22 hours of the day". Patient doesn't provide much history but does answer the questions. Wife provides the majority of the history.   ROS: All systems negative  except as listed in HPI, PMH and Problem List.  SH:  Social History   Socioeconomic History   Marital status: Married    Spouse name: Not on file   Number of children: 2   Years of education: some college   Highest education level: 12th grade  Occupational History    Employer: DISABLED  Tobacco Use   Smoking status: Former    Types: Cigars    Quit date: 02/05/2018    Years since quitting: 5.3    Passive exposure: Past   Smokeless tobacco: Never   Tobacco comments:    smoking cessation materials not required  Vaping Use   Vaping status: Never Used  Substance and Sexual Activity   Alcohol use: No    Alcohol/week: 0.0 standard drinks of alcohol   Drug use: No   Sexual activity: Never  Other Topics Concern   Not on file  Social History Narrative   Used to be a  Glass blower/designer, 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68   Lives with wife   They have two grown children       Social Determinants of Health   Financial Resource Strain: Low Risk  (02/22/2023)   Overall Financial Resource Strain (CARDIA)    Difficulty of Paying Living Expenses: Not hard at all  Food Insecurity: No Food Insecurity (02/22/2023)   Hunger Vital Sign    Worried About Running Out of Food in the Last Year: Never true    Ran Out of Food in the Last Year: Never true  Transportation Needs: No Transportation Needs (02/22/2023)   PRAPARE - Administrator, Civil Service (Medical): No    Lack of Transportation (Non-Medical): No  Physical Activity: Inactive (02/22/2023)   Exercise Vital Sign    Days of Exercise per Week: 0 days    Minutes of Exercise per Session: 0 min  Stress: No Stress Concern Present (02/22/2023)   [employer] of Occupational Health - Occupational Stress Questionnaire    Feeling of Stress : Not at all  Social Connections: Moderately Isolated (02/22/2023)   Social Connection and Isolation Panel [NHANES]    Frequency of Communication with  Friends and Family: More than three times a week    Frequency of Social Gatherings with Friends and Family: Once a week    Attends Religious Services: Never    Database administrator or Organizations: No    Attends Banker Meetings: Never    Marital Status: Married  Catering manager Violence: Not At Risk (02/22/2023)   Humiliation, Afraid, Rape, and Kick questionnaire    Fear of Current or Ex-Partner: No    Emotionally Abused: No    Physically Abused: No    Sexually Abused: No    FH:  Family History  Problem Relation Age of Onset   Depression Mother     Past Medical History:  Diagnosis Date   Arrhythmia    Bipolar 1 disorder (HCC)    Depression    Diabetes insipidus (HCC)    History of DVT (deep vein thrombosis)    History of kidney stones    History of pulmonary embolism    Hyperlipemia    Lithium toxicity    Pacemaker    Stage III chronic kidney disease (HCC)     Current Outpatient Medications  Medication Sig Dispense Refill   ALPRAZolam (XANAX) 0.5 MG tablet Take 1 tablet (0.5 mg total) by mouth 3 (three) times daily. 90 tablet 5   cholecalciferol (VITAMIN D) 1000 units tablet Take 1,000 Units by mouth daily.     clopidogrel (PLAVIX) 75 MG tablet Take 1 tablet (75 mg total) by mouth every morning. 90 tablet 1   cyanocobalamin (VITAMIN B12) 1000 MCG tablet Take 1,000 mcg by mouth daily.     lamoTRIgine (LAMICTAL) 200 MG tablet Take 1 tablet (200 mg total) by mouth daily. 90 tablet 1   metoprolol succinate (TOPROL-XL) 25 MG 24 hr tablet Take 25 mg by mouth daily. Wife reports pt now taking 1 full tablet     OLANZapine (ZYPREXA) 5 MG tablet TAKE 1 TABLET BY MOUTH ATBEDTIME. 90 tablet 1   rosuvastatin (CRESTOR) 20 MG tablet Take 1 tablet (20 mg total) by mouth daily. 90 tablet 1   tamsulosin (FLOMAX) 0.4 MG CAPS capsule Take 1 capsule (0.4 mg total) by mouth every morning. 90 capsule 1   torsemide (DEMADEX) 20 MG tablet Take 1 tablet (20 mg total) by mouth  every other day. 3 tablet 0   No current facility-administered medications for this visit.   Vitals:   06/04/23 1124  BP: 135/73  Pulse: 87  SpO2: 97%  Weight: 177 lb (80.3 kg)  Height: 5\' 10"  (1.778 m)   Wt Readings from Last 3 Encounters:  06/04/23 177 lb (80.3 kg)  05/02/23 179 lb (81.2 kg)  04/18/23 177 lb (80.3 kg)   Lab Results  Component Value Date   CREATININE 3.01 (H) 05/02/2023   CREATININE 3.20 (H) 04/26/2023   CREATININE 3.12 (H) 02/14/2023   PHYSICAL EXAM:  General:  Well appearing. No resp difficulty HEENT: normal Neck: supple. JVP flat. No lymphadenopathy or thryomegaly appreciated. Cor: PMI normal. Regular rate & rhythm. No rubs, gallops or murmurs. Lungs: clear Abdomen: soft, nontender, nondistended. No hepatosplenomegaly. No bruits or masses.  Extremities: no cyanosis, clubbing, rash, edema Neuro: alert & oriented x3, cranial nerves grossly intact. Moves all 4 extremities w/o difficulty. Affect pleasant.   ECG: not done   ASSESSMENT & PLAN:  1: Chronic ischemic heart failure with reduced ejection fraction- - NYHA class III - euvolemic - weighing daily; reminded to call for overnight weight gain of > 2 pounds or a weekly weight gain of > 5 pounds - weight up 2 pounds from last visit here 2 weeks ago - Echo 12/12/21: EF 30-35% along with moderate MR and mild/ moderate TR.  - Echo 10/11/22: EF 35-40% along with trace MR. - continue metoprolol succinate 25mg  daily - due to CKD, will be unable to tolerate aldactone/ ARB/ entresto - BMP today - BNP 10/10/22 was 6434  2: HTN- - BP 135/73 - saw PCP Carlynn Purl) 05/24 - BMP 05/02/23 showed sodium 138, potassium 3.7, creatinine 3.01 and GFR 21 - BMP today - saw nephrology pre covid; wife says she needs to call and get appt scheduled  3: CAD- - saw cardiology (Thongteum) 07/24 - CTO of RCA in 2014 -Stress test 01/23/23: 1) SPECT Tc76m Cardiolite stress myocardial perfusion imaging showed a moderate sized,  severely intense fixed defect of the inferoseptal wall, suggestive of prior infarct with no reversible ischemia  2) There was no TID.   3) The LV ejection fraction was calculated post stress at 37 %   4: CHB- - abdominal pacemaker implantation    Return in 6 months, sooner if needed.

## 2023-06-05 ENCOUNTER — Telehealth: Payer: Self-pay

## 2023-06-05 NOTE — Telephone Encounter (Addendum)
Reviewed results and provider message below with patient's spouse.  Pt's spouse verbalized understanding, but states he has not been taking any torsemide. Was only prescribed 3 tablets previously.   Provider notified and torsemide removed from medication list.  Pt spouse reports she will call nephrology to schedule follow up appointment.   ----- Message from Delma Freeze sent at 06/05/2023  8:00 AM EDT ----- Kidney function is worse. Decrease torsemide to 10mg  every other day and schedule follow-up nephrology appointment.

## 2023-07-17 DIAGNOSIS — I442 Atrioventricular block, complete: Secondary | ICD-10-CM | POA: Diagnosis not present

## 2023-07-17 DIAGNOSIS — Z950 Presence of cardiac pacemaker: Secondary | ICD-10-CM | POA: Diagnosis not present

## 2023-08-14 ENCOUNTER — Telehealth (HOSPITAL_BASED_OUTPATIENT_CLINIC_OR_DEPARTMENT_OTHER): Payer: Medicare Other | Admitting: Psychiatry

## 2023-08-14 ENCOUNTER — Encounter (HOSPITAL_COMMUNITY): Payer: Self-pay | Admitting: Psychiatry

## 2023-08-14 VITALS — Wt 177.0 lb

## 2023-08-14 DIAGNOSIS — F09 Unspecified mental disorder due to known physiological condition: Secondary | ICD-10-CM | POA: Diagnosis not present

## 2023-08-14 DIAGNOSIS — F319 Bipolar disorder, unspecified: Secondary | ICD-10-CM

## 2023-08-14 DIAGNOSIS — F411 Generalized anxiety disorder: Secondary | ICD-10-CM | POA: Diagnosis not present

## 2023-08-14 MED ORDER — ALPRAZOLAM 0.5 MG PO TABS
0.5000 mg | ORAL_TABLET | Freq: Three times a day (TID) | ORAL | 5 refills | Status: DC
Start: 2023-08-14 — End: 2024-02-11

## 2023-08-14 MED ORDER — OLANZAPINE 5 MG PO TABS
ORAL_TABLET | ORAL | 1 refills | Status: DC
Start: 2023-08-14 — End: 2024-02-11

## 2023-08-14 MED ORDER — LAMOTRIGINE 100 MG PO TABS
100.0000 mg | ORAL_TABLET | Freq: Every day | ORAL | 1 refills | Status: DC
Start: 2023-08-14 — End: 2024-02-11

## 2023-08-14 NOTE — Progress Notes (Signed)
[facility] MD Virtual Progress Note   Patient Location: Home Provider Location: Home Office  I connect with patient by video and verified that I am speaking with correct person by using two identifiers. I discussed the limitations of evaluation and management by telemedicine and the availability of in person appointments. I also discussed with the patient that there may be a patient responsible charge related to this service. The patient expressed understanding and agreed to proceed.  Jeffrey Herring [id number] 72 y.o.  08/14/2023 10:05 AM  History of Present Illness:  Patient is evaluated by video session.  His wife was also present in the session.  Patient is lying in the bed and feeling tired.  His wife reported he has sundowning and sometime he sleeps very early in the evening and wake up early in the morning.  Patient admitted that he also takes sometime now due to the day.  He does not go outside as often.  As per wife he has no agitation, anger, mood swings or suicidal thoughts.  He had cut down his olanzapine and he is taking only 5 mg.  He used to take 10 mg but gradually dose has been reduced.  Patient denies any hallucination, paranoia, suicidal thoughts.  His appetite is okay.  He reported his memory is slowly and gradually declining.  Sometimes he gets very confused.  He is seeing cardiology and they are talking about to start Jardiance to help his heart.  He also seeing nephrology.  His last creatinine was 3.39.  His appointment coming up soon to see nephrology to repeat blood work.  Patient has no tremors.  He denies any feeling of hopelessness.  He reported that medicine working and he does not get upset or agitated.  He has no rash or any itching.  Past Psychiatric History: H/O bipolar disorder. H/O inpatient mania and psychosis.  Good response with lithium until had a lithium toxicity.  Took Abilify, Risperdal, Prozac, Lexapro, Wellbutrin and Navane.     Outpatient Encounter Medications as of 08/14/2023  Medication Sig   ALPRAZolam (XANAX) 0.5 MG tablet Take 1 tablet (0.5 mg total) by mouth 3 (three) times daily.   cholecalciferol (VITAMIN D) 1000 units tablet Take 1,000 Units by mouth daily.   clopidogrel (PLAVIX) 75 MG tablet Take 1 tablet (75 mg total) by mouth every morning.   cyanocobalamin (VITAMIN B12) 1000 MCG tablet Take 1,000 mcg by mouth daily.   lamoTRIgine (LAMICTAL) 200 MG tablet Take 1 tablet (200 mg total) by mouth daily.   metoprolol succinate (TOPROL-XL) 25 MG 24 hr tablet Take 25 mg by mouth daily. Wife reports pt now taking 1 full tablet   OLANZapine (ZYPREXA) 5 MG tablet TAKE 1 TABLET BY MOUTH ATBEDTIME.   rosuvastatin (CRESTOR) 20 MG tablet Take 1 tablet (20 mg total) by mouth daily.   tamsulosin (FLOMAX) 0.4 MG CAPS capsule Take 1 capsule (0.4 mg total) by mouth every morning.   No facility-administered encounter medications on file as of 08/14/2023.    Recent Results (from the past 2160 hour(s))  Basic metabolic panel     Status: Abnormal   Collection Time: 06/04/23 12:51 PM  Result Value Ref Range   Glucose 83 70 - 99 mg/dL   BUN 28 (H) 8 - 27 mg/dL   Creatinine, Ser 0.86 (H) 0.76 - 1.27 mg/dL   eGFR 18 (L) >57 QI/ONG/2.95   BUN/Creatinine Ratio 8 (L) 10 - 24   Sodium 141 134 - 144 mmol/L  Potassium 4.4 3.5 - 5.2 mmol/L   Chloride 104 96 - 106 mmol/L   CO2 18 (L) 20 - 29 mmol/L   Calcium 11.1 (H) 8.6 - 10.2 mg/dL     Psychiatric Specialty Exam: Physical Exam  Review of Systems  Weight 177 lb (80.3 kg).There is no height or weight on file to calculate BMI.  General Appearance: Fairly Groomed  Eye Contact:  Fair  Speech:  Slow  Volume:  Decreased  Mood:   tired  Affect:  Congruent  Thought Process:  Descriptions of Associations: Loose  Orientation:  Full (Time, Place, and Person)  Thought Content:  Rumination  Suicidal Thoughts:  No  Homicidal Thoughts:  No  Memory:  Immediate;    Fair Recent;   Fair Remote;   Fair  Judgement:  Fair  Insight:  Shallow  Psychomotor Activity:  Decreased  Concentration:  Concentration: Fair and Attention Span: Fair  Recall:  [employer] of Knowledge:  Fair  Language:  Fair  Akathisia:  No  Handed:  Right  AIMS (if indicated):     Assets:  Communication Skills Desire for Improvement Housing Social Support  ADL's:  Impaired  Cognition:  Impaired,  Mild  Sleep:  sundowning     Assessment/Plan: Bipolar 1 disorder (HCC) - Plan: ALPRAZolam (XANAX) 0.5 MG tablet, lamoTRIgine (LAMICTAL) 100 MG tablet, OLANZapine (ZYPREXA) 5 MG tablet  GAD (generalized anxiety disorder) - Plan: ALPRAZolam (XANAX) 0.5 MG tablet  Cognitive disorder - Plan: OLANZapine (ZYPREXA) 5 MG tablet  I reviewed blood work results and current medication.  Recommend to decrease Lamictal as creatinine 3.39.  In the past patient and his wife is very reluctant to cut down the dose because patient relapsed but now both agree to try a lower dose of Lamictal.  We will cut down the Lamictal from 200 mg to 100 mg.  Continue olanzapine 5 mg at bedtime and Xanax 0.5 mg 3 times a day.  Encouraged to call us back if they notice any worsening of symptoms.  Encouraged to keep appointment nephrology for creatinine and blood work.  Follow-up in 6 months.   Follow Up Instructions:     I discussed the assessment and treatment plan with the patient. The patient was provided an opportunity to ask questions and all were answered. The patient agreed with the plan and demonstrated an understanding of the instructions.   The patient was advised to call back or seek an in-person evaluation if the symptoms worsen or if the condition fails to improve as anticipated.    Collaboration of Care: Other provider involved in patient's care AEB notes are available in epic to review  Patient/Guardian was advised Release of Information must be obtained prior to any record release in order to  collaborate their care with an outside provider. Patient/Guardian was advised if they have not already done so to contact the registration department to sign all necessary forms in order for [place] to release information regarding their care.   Consent: Patient/Guardian gives verbal consent for treatment and assignment of benefits for services provided during this visit. Patient/Guardian expressed understanding and agreed to proceed.     I provided 24 minutes of non face to face time during this encounter.  Note: This document was prepared by [employer] voice dictation technology and any errors that results from this process are unintentional.    Cleotis Nipper, MD 08/14/2023

## 2023-08-16 ENCOUNTER — Other Ambulatory Visit: Payer: Self-pay | Admitting: Family Medicine

## 2023-08-16 DIAGNOSIS — I2510 Atherosclerotic heart disease of native coronary artery without angina pectoris: Secondary | ICD-10-CM

## 2023-08-16 DIAGNOSIS — I495 Sick sinus syndrome: Secondary | ICD-10-CM

## 2023-08-16 DIAGNOSIS — N401 Enlarged prostate with lower urinary tract symptoms: Secondary | ICD-10-CM

## 2023-08-16 DIAGNOSIS — I5022 Chronic systolic (congestive) heart failure: Secondary | ICD-10-CM

## 2023-08-17 ENCOUNTER — Encounter: Payer: Self-pay | Admitting: Family Medicine

## 2023-08-17 ENCOUNTER — Ambulatory Visit (INDEPENDENT_AMBULATORY_CARE_PROVIDER_SITE_OTHER): Payer: Medicare Other | Admitting: Family Medicine

## 2023-08-17 VITALS — BP 132/70 | HR 86 | Temp 98.2°F | Resp 18 | Ht 70.0 in | Wt 176.3 lb

## 2023-08-17 DIAGNOSIS — E559 Vitamin D deficiency, unspecified: Secondary | ICD-10-CM | POA: Insufficient documentation

## 2023-08-17 DIAGNOSIS — Z8673 Personal history of transient ischemic attack (TIA), and cerebral infarction without residual deficits: Secondary | ICD-10-CM

## 2023-08-17 DIAGNOSIS — N2581 Secondary hyperparathyroidism of renal origin: Secondary | ICD-10-CM

## 2023-08-17 DIAGNOSIS — I5022 Chronic systolic (congestive) heart failure: Secondary | ICD-10-CM | POA: Diagnosis not present

## 2023-08-17 DIAGNOSIS — N138 Other obstructive and reflux uropathy: Secondary | ICD-10-CM

## 2023-08-17 DIAGNOSIS — I2510 Atherosclerotic heart disease of native coronary artery without angina pectoris: Secondary | ICD-10-CM

## 2023-08-17 DIAGNOSIS — F319 Bipolar disorder, unspecified: Secondary | ICD-10-CM | POA: Diagnosis not present

## 2023-08-17 DIAGNOSIS — Z23 Encounter for immunization: Secondary | ICD-10-CM | POA: Diagnosis not present

## 2023-08-17 DIAGNOSIS — I420 Dilated cardiomyopathy: Secondary | ICD-10-CM

## 2023-08-17 DIAGNOSIS — N184 Chronic kidney disease, stage 4 (severe): Secondary | ICD-10-CM

## 2023-08-17 DIAGNOSIS — N401 Enlarged prostate with lower urinary tract symptoms: Secondary | ICD-10-CM | POA: Diagnosis not present

## 2023-08-17 DIAGNOSIS — E538 Deficiency of other specified B group vitamins: Secondary | ICD-10-CM

## 2023-08-17 MED ORDER — ROSUVASTATIN CALCIUM 40 MG PO TABS
40.0000 mg | ORAL_TABLET | Freq: Every day | ORAL | 1 refills | Status: DC
Start: 2023-08-17 — End: 2024-02-22

## 2023-08-28 DIAGNOSIS — Z23 Encounter for immunization: Secondary | ICD-10-CM | POA: Diagnosis not present

## 2023-09-17 DIAGNOSIS — I483 Typical atrial flutter: Secondary | ICD-10-CM | POA: Diagnosis not present

## 2023-09-17 DIAGNOSIS — I10 Essential (primary) hypertension: Secondary | ICD-10-CM | POA: Diagnosis not present

## 2023-09-17 DIAGNOSIS — I2510 Atherosclerotic heart disease of native coronary artery without angina pectoris: Secondary | ICD-10-CM | POA: Diagnosis not present

## 2023-09-17 DIAGNOSIS — I420 Dilated cardiomyopathy: Secondary | ICD-10-CM | POA: Diagnosis not present

## 2023-09-17 DIAGNOSIS — I442 Atrioventricular block, complete: Secondary | ICD-10-CM | POA: Diagnosis not present

## 2023-10-16 DIAGNOSIS — Z950 Presence of cardiac pacemaker: Secondary | ICD-10-CM | POA: Diagnosis not present

## 2023-10-16 DIAGNOSIS — I442 Atrioventricular block, complete: Secondary | ICD-10-CM | POA: Diagnosis not present

## 2023-10-18 ENCOUNTER — Other Ambulatory Visit (HOSPITAL_COMMUNITY): Payer: Self-pay | Admitting: Psychiatry

## 2023-10-18 DIAGNOSIS — F319 Bipolar disorder, unspecified: Secondary | ICD-10-CM

## 2023-11-21 ENCOUNTER — Encounter: Payer: Self-pay | Admitting: Family Medicine

## 2023-11-21 ENCOUNTER — Telehealth: Payer: Self-pay | Admitting: Family Medicine

## 2023-11-21 ENCOUNTER — Other Ambulatory Visit: Payer: Self-pay | Admitting: Family Medicine

## 2023-11-21 DIAGNOSIS — F3181 Bipolar II disorder: Secondary | ICD-10-CM | POA: Diagnosis not present

## 2023-11-21 DIAGNOSIS — N184 Chronic kidney disease, stage 4 (severe): Secondary | ICD-10-CM | POA: Diagnosis not present

## 2023-11-21 DIAGNOSIS — Z2911 Encounter for prophylactic immunotherapy for respiratory syncytial virus (RSV): Secondary | ICD-10-CM

## 2023-11-21 NOTE — Telephone Encounter (Signed)
 Wife calling and requesting an RSV vaccine prescription to be called into pharmacy. Pt needing prescription as he does not have part D medicare. Wife informed it needs to be called in.  [id number] [place], [place] - [address] AT [facility] [address] [place] [address] Phone: [phone] Fax: [phone]   Wife requesting call back once prescription is called in:  [phone]

## 2023-11-21 NOTE — Telephone Encounter (Signed)
 Ok for him to have RSV vaccine?

## 2023-11-22 NOTE — Telephone Encounter (Signed)
 Copied from CRM [id number]. Topic: General - Other >> Nov 22, 2023 10:14 AM Lorrane Rosette wrote: Reason for CRM: Dr. Laurena Pong a Nephrologist at [facility] requests that Dr. Ava Lei return her call at [phone]

## 2023-11-23 ENCOUNTER — Telehealth: Payer: Self-pay | Admitting: Family

## 2023-11-23 NOTE — Telephone Encounter (Signed)
 Pt confirmed appt on 11/26/23

## 2023-11-25 NOTE — Progress Notes (Signed)
 Advanced Heart Failure Clinic Note   WUJ:WJXBJY, Arvis Laura, MD (last seen 11/24) Primary Cardiologist: Augie Bliss, MD (last seen 12/24)  Chief Complaint:   HPI:   Mr Jeffrey Herring is a 73 y/o male with a history of sinoatrial node dysfunction, atrial flutter (ablation 01/04/23), CHB s/p abdominal pacemaker, CAD with stent in 2014, bipolar, BPH, HTN, cognitive dysfunction, CVA, CKD, hyperlipidemia, PE, previous endocarditis and chronic heart failure.   In the past, he had unsuccessful attempt at transvenous BiV ICD implantation due to occluded SVC and did not meet the criteria for a subcutaneous ICD. He had a generator change on October 06, 2022.   He has a pacemaker. First one in 1983, had some vegetation on pacemaker leads back in 2009 and sees Dr. Claudius Cumins at [facility]. Pacemaker was replaced again 01/2018   Last pacemaker removed due to battery replacement done 10/03/2022.  Was in the ED 10/30/22 due to N/V. Had lengthy admission 12/23 with severe drug rash with 80% body surface area covered with associate rhabdomyolysis. AKI with creatinine 5.9. Rash felt to be due to vancomycin. Needed dialysis.   Echo 12/12/21: EF 30-35% along with moderate MR and mild/ moderate TR.   Echo 10/11/22: EF 35-40% along with trace MR.  Stress test 01/23/23: 1) SPECT Tc62m Cardiolite stress myocardial perfusion imaging showed a moderate sized, severely intense fixed defect of the inferoseptal wall, suggestive of prior infarct with no reversible ischemia  2) There was no TID.   3) The LV ejection fraction was calculated post stress at 37 %   He presents today, with his wife, for a HF follow-up visit with a chief complaint of moderate fatigue with minimal exertion. Has shortness of breath and intermittent dizziness along with this. Denies cough, chest pain, palpitations, abdominal distention, pedal edema or weight gain. Wife reports that patient is tolerating the jardiance .   Wife reports that patient  stays in the bed "22 hours of the day". Patient doesn't provide much history but does answer some questions. Wife provides the majority of the history.   ROS: All systems negative except as listed in HPI, PMH and Problem List.  SH:  Social History   Socioeconomic History   Marital status: Married    Spouse name: Not on file   Number of children: 2   Years of education: some college   Highest education level: 12th grade  Occupational History    Employer: DISABLED  Tobacco Use   Smoking status: Former    Types: Cigars    Quit date: 02/05/2018    Years since quitting: 5.8    Passive exposure: Past   Smokeless tobacco: Never   Tobacco comments:    smoking cessation materials not required  Vaping Use   Vaping status: Never Used  Substance and Sexual Activity   Alcohol use: No    Alcohol/week: 0.0 standard drinks of alcohol   Drug use: No   Sexual activity: Never  Other Topics Concern   Not on file  Social History Narrative   Used to be a  Glass blower/designer, 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20   Lives with wife   They have two grown children       Social Drivers of Health   Financial Resource Strain: Low Risk  (02/22/2023)   Overall Financial Resource Strain (CARDIA)    Difficulty of Paying Living Expenses: Not hard at all  Food Insecurity: No Food Insecurity (02/22/2023)   Hunger Vital Sign  Worried About Programme researcher, broadcasting/film/video in the Last Year: Never true    Ran Out of Food in the Last Year: Never true  Transportation Needs: No Transportation Needs (02/22/2023)   PRAPARE - Administrator, Civil Service (Medical): No    Lack of Transportation (Non-Medical): No  Physical Activity: Inactive (02/22/2023)   Exercise Vital Sign    Days of Exercise per Week: 0 days    Minutes of Exercise per Session: 0 min  Stress: No Stress Concern Present (02/22/2023)   [employer] of Occupational Health - Occupational Stress Questionnaire     Feeling of Stress : Not at all  Social Connections: Moderately Isolated (02/22/2023)   Social Connection and Isolation Panel [NHANES]    Frequency of Communication with Friends and Family: More than three times a week    Frequency of Social Gatherings with Friends and Family: Once a week    Attends Religious Services: Never    Database administrator or Organizations: No    Attends Banker Meetings: Never    Marital Status: Married  Catering manager Violence: Not At Risk (02/22/2023)   Humiliation, Afraid, Rape, and Kick questionnaire    Fear of Current or Ex-Partner: No    Emotionally Abused: No    Physically Abused: No    Sexually Abused: No    FH:  Family History  Problem Relation Age of Onset   Depression Mother     Past Medical History:  Diagnosis Date   Arrhythmia    Bipolar 1 disorder (HCC)    Depression    Diabetes insipidus (HCC)    History of DVT (deep vein thrombosis)    History of kidney stones    History of pulmonary embolism    Hyperlipemia    Lithium  toxicity    Pacemaker    Stage III chronic kidney disease (HCC)     Current Outpatient Medications  Medication Sig Dispense Refill   ALPRAZolam  (XANAX ) 0.5 MG tablet Take 1 tablet (0.5 mg total) by mouth 3 (three) times daily. 90 tablet 5   cholecalciferol (VITAMIN D ) 1000 units tablet Take 1,000 Units by mouth daily.     clopidogrel  (PLAVIX ) 75 MG tablet TAKE 1 TABLET (75 MG TOTAL) BY MOUTH EVERY MORNING. 90 tablet 1   cyanocobalamin  (VITAMIN B12) 1000 MCG tablet Take 1,000 mcg by mouth daily.     lamoTRIgine  (LAMICTAL ) 100 MG tablet Take 1 tablet (100 mg total) by mouth daily. 90 tablet 1   metoprolol  succinate (TOPROL -XL) 25 MG 24 hr tablet Take 25 mg by mouth daily. Wife reports pt now taking 1 full tablet     OLANZapine  (ZYPREXA ) 5 MG tablet TAKE 1 TABLET BY MOUTH ATBEDTIME. 90 tablet 1   rosuvastatin  (CRESTOR ) 40 MG tablet Take 1 tablet (40 mg total) by mouth daily. 90 tablet 1   tamsulosin   (FLOMAX ) 0.4 MG CAPS capsule TAKE 1 CAPSULE (0.4 MG TOTAL) BY MOUTH EVERY MORNING. 90 capsule 1   No current facility-administered medications for this visit.   Vitals:   11/26/23 1048 11/26/23 1049  BP: 130/89   Pulse: 88 83  SpO2: 91% 93%  Weight: 172 lb (78 kg)    Wt Readings from Last 3 Encounters:  11/26/23 172 lb (78 kg)  08/17/23 176 lb 4.8 oz (80 kg)  06/04/23 177 lb (80.3 kg)   Lab Results  Component Value Date   CREATININE 3.39 (H) 06/04/2023   CREATININE 3.01 (H) 05/02/2023   CREATININE 3.20 (  H) 04/26/2023    PHYSICAL EXAM:  General: Well appearing. No resp difficulty HEENT: normal Neck: supple, no JVD Cor: Regular rhythm, rate. No rubs, gallops or murmurs Lungs: clear Abdomen: soft, nontender, nondistended. Extremities: no cyanosis, clubbing, rash, edema Neuro: alert, answering questions. Moves all 4 extremities w/o difficulty. Affect pleasant   ECG: not done   ASSESSMENT & PLAN:  1: Chronic ischemic heart failure with reduced ejection fraction- - NYHA class III - euvolemic - weighing daily; reminded to call for overnight weight gain of > 2 pounds or a weekly weight gain of > 5 pounds - weight down 5 pounds from last visit here 6 months ago - Echo 12/12/21: EF 30-35% along with moderate MR and mild/ moderate TR.  - Echo 10/11/22: EF 35-40% along with trace MR. - continue metoprolol  succinate 25mg  daily - continue jardiance  10mg  daily - CKD will not tolerate MRA or ARNi ARB - BNP 10/10/22 was 6434  2: HTN- - BP 130/89 - saw PCP Ava Lei) 11/24 - BMP 11/21/23 reviewed and showed sodium 141, potassium 3.7, creatinine 3.23 and GFR 19 - saw nephrology Laurena Pong) 02/25 & sodium bicarb started  3: CAD- - saw cardiology (Thongteum) 12/24 - continue clopidogrel  75mg  daily - continue rosuvastatin  40mg  daily - CTO of RCA in 2014 -Stress test 01/23/23: 1) SPECT Tc74m Cardiolite stress myocardial perfusion imaging showed a moderate sized, severely intense fixed  defect of the inferoseptal wall, suggestive of prior infarct with no reversible ischemia  2) There was no TID.   3) The LV ejection fraction was calculated post stress at 37 %   4: CHB- - abdominal pacemaker implantation  - device interrogated at cardiology office 09/17/23  5: Hypercalcemia- - Calcium  11.6 on 11/21/23 - appears that his trend over the last several months has been usually mid-upper 10's to low 11's - nephrology recommended endocrinology   Return in 6 months, sooner if needed. Plan to update echo at that time if not done at cardiology office  Charlette Console, FNP 11/25/23

## 2023-11-26 ENCOUNTER — Telehealth: Payer: Self-pay

## 2023-11-26 ENCOUNTER — Encounter: Payer: Self-pay | Admitting: Family

## 2023-11-26 ENCOUNTER — Ambulatory Visit: Payer: Medicare Other | Attending: Family | Admitting: Family

## 2023-11-26 VITALS — BP 130/89 | HR 83 | Wt 172.0 lb

## 2023-11-26 DIAGNOSIS — Z86711 Personal history of pulmonary embolism: Secondary | ICD-10-CM | POA: Diagnosis not present

## 2023-11-26 DIAGNOSIS — Z992 Dependence on renal dialysis: Secondary | ICD-10-CM | POA: Diagnosis not present

## 2023-11-26 DIAGNOSIS — Z79899 Other long term (current) drug therapy: Secondary | ICD-10-CM | POA: Diagnosis not present

## 2023-11-26 DIAGNOSIS — Z955 Presence of coronary angioplasty implant and graft: Secondary | ICD-10-CM | POA: Diagnosis not present

## 2023-11-26 DIAGNOSIS — I130 Hypertensive heart and chronic kidney disease with heart failure and stage 1 through stage 4 chronic kidney disease, or unspecified chronic kidney disease: Secondary | ICD-10-CM | POA: Insufficient documentation

## 2023-11-26 DIAGNOSIS — Z7902 Long term (current) use of antithrombotics/antiplatelets: Secondary | ICD-10-CM | POA: Diagnosis not present

## 2023-11-26 DIAGNOSIS — I5022 Chronic systolic (congestive) heart failure: Secondary | ICD-10-CM

## 2023-11-26 DIAGNOSIS — N189 Chronic kidney disease, unspecified: Secondary | ICD-10-CM | POA: Diagnosis not present

## 2023-11-26 DIAGNOSIS — I10 Essential (primary) hypertension: Secondary | ICD-10-CM | POA: Diagnosis not present

## 2023-11-26 DIAGNOSIS — I442 Atrioventricular block, complete: Secondary | ICD-10-CM | POA: Diagnosis not present

## 2023-11-26 DIAGNOSIS — E785 Hyperlipidemia, unspecified: Secondary | ICD-10-CM | POA: Diagnosis not present

## 2023-11-26 DIAGNOSIS — I2510 Atherosclerotic heart disease of native coronary artery without angina pectoris: Secondary | ICD-10-CM | POA: Diagnosis not present

## 2023-11-26 DIAGNOSIS — Z950 Presence of cardiac pacemaker: Secondary | ICD-10-CM | POA: Insufficient documentation

## 2023-11-26 DIAGNOSIS — Z8673 Personal history of transient ischemic attack (TIA), and cerebral infarction without residual deficits: Secondary | ICD-10-CM | POA: Diagnosis not present

## 2023-11-26 MED ORDER — EMPAGLIFLOZIN 10 MG PO TABS: 10.0000 mg | ORAL_TABLET | Freq: Every day | ORAL | Status: DC

## 2023-11-26 NOTE — Telephone Encounter (Signed)
 Copied from CRM [id number]. Topic: General - Other >> Nov 26, 2023 10:30 AM Lorrane Rosette wrote: Reason for CRM: Dr. Laurena Pong a Nephrologist at [facility] called once again and requests that Dr. Ava Lei return her call asap at [phone]

## 2023-11-26 NOTE — Telephone Encounter (Signed)
 Called Dr.Voora number provided unable to get a hold of someone. If provider calls again please give the provider, Dr.Sowles number provided to reach out to her.

## 2023-11-26 NOTE — Telephone Encounter (Signed)
 Pt brought Jardiance  Pt Assistance Application Renewal form to his appt today.  Provider portion signed and completed application was faxed successfully to [employer] [facility] on the pt's behalf.

## 2023-11-26 NOTE — Patient Instructions (Addendum)
Good to see you today!  

## 2023-12-03 ENCOUNTER — Telehealth: Payer: Self-pay

## 2023-12-03 NOTE — Telephone Encounter (Signed)
Patient has been approved for [employer] through [facility] until 11/27/2024.  Patient ID: [id number]

## 2024-01-15 DIAGNOSIS — Z950 Presence of cardiac pacemaker: Secondary | ICD-10-CM | POA: Diagnosis not present

## 2024-01-15 DIAGNOSIS — I442 Atrioventricular block, complete: Secondary | ICD-10-CM | POA: Diagnosis not present

## 2024-01-29 ENCOUNTER — Other Ambulatory Visit (HOSPITAL_COMMUNITY): Payer: Self-pay | Admitting: Psychiatry

## 2024-01-29 DIAGNOSIS — F09 Unspecified mental disorder due to known physiological condition: Secondary | ICD-10-CM

## 2024-01-29 DIAGNOSIS — F411 Generalized anxiety disorder: Secondary | ICD-10-CM

## 2024-01-29 DIAGNOSIS — F319 Bipolar disorder, unspecified: Secondary | ICD-10-CM

## 2024-02-11 ENCOUNTER — Other Ambulatory Visit (HOSPITAL_COMMUNITY): Payer: Self-pay

## 2024-02-11 ENCOUNTER — Telehealth (HOSPITAL_COMMUNITY): Payer: Self-pay

## 2024-02-11 ENCOUNTER — Telehealth (HOSPITAL_COMMUNITY): Payer: Medicare Other | Admitting: Psychiatry

## 2024-02-11 DIAGNOSIS — F411 Generalized anxiety disorder: Secondary | ICD-10-CM

## 2024-02-11 DIAGNOSIS — F319 Bipolar disorder, unspecified: Secondary | ICD-10-CM

## 2024-02-11 DIAGNOSIS — F09 Unspecified mental disorder due to known physiological condition: Secondary | ICD-10-CM

## 2024-02-11 MED ORDER — OLANZAPINE 5 MG PO TABS
ORAL_TABLET | ORAL | 0 refills | Status: DC
Start: 2024-02-11 — End: 2024-02-13

## 2024-02-11 MED ORDER — LAMOTRIGINE 100 MG PO TABS
100.0000 mg | ORAL_TABLET | Freq: Every day | ORAL | 0 refills | Status: DC
Start: 2024-02-11 — End: 2024-02-13

## 2024-02-11 MED ORDER — ALPRAZOLAM 0.5 MG PO TABS
0.5000 mg | ORAL_TABLET | Freq: Three times a day (TID) | ORAL | 0 refills | Status: DC
Start: 2024-02-11 — End: 2024-02-13

## 2024-02-11 NOTE — Telephone Encounter (Signed)
 Patient was rescheduled and will need his Xanax  prescription sent in. I refilled the other two prescriptions for 30 days, but patients pharmacy will not take a verbal for the control. Please send to [facility], thank you

## 2024-02-11 NOTE — Telephone Encounter (Signed)
 Appointment is scheduled

## 2024-02-11 NOTE — Telephone Encounter (Signed)
 I sent Xanax  0.5 mg 3 times a day prescription for 30-day.  He needs appointment.

## 2024-02-13 ENCOUNTER — Telehealth (HOSPITAL_COMMUNITY): Admitting: Psychiatry

## 2024-02-13 ENCOUNTER — Encounter (HOSPITAL_COMMUNITY): Payer: Self-pay | Admitting: Psychiatry

## 2024-02-13 DIAGNOSIS — F09 Unspecified mental disorder due to known physiological condition: Secondary | ICD-10-CM

## 2024-02-13 DIAGNOSIS — F319 Bipolar disorder, unspecified: Secondary | ICD-10-CM

## 2024-02-13 DIAGNOSIS — F411 Generalized anxiety disorder: Secondary | ICD-10-CM

## 2024-02-13 MED ORDER — OLANZAPINE 5 MG PO TABS
ORAL_TABLET | ORAL | 1 refills | Status: DC
Start: 2024-02-13 — End: 2024-08-14

## 2024-02-13 MED ORDER — LAMOTRIGINE 100 MG PO TABS
100.0000 mg | ORAL_TABLET | Freq: Every day | ORAL | 1 refills | Status: DC
Start: 2024-02-13 — End: 2024-08-14

## 2024-02-13 MED ORDER — ALPRAZOLAM 0.5 MG PO TABS
0.5000 mg | ORAL_TABLET | Freq: Three times a day (TID) | ORAL | 5 refills | Status: DC
Start: 2024-02-13 — End: 2024-08-14

## 2024-02-13 NOTE — Progress Notes (Signed)
 [facility] MD Virtual Progress Note   Patient Location: Home Provider Location: Home Office  I connect with patient by video and verified that I am speaking with correct person by using two identifiers. I discussed the limitations of evaluation and management by telemedicine and the availability of in person appointments. I also discussed with the patient that there may be a patient responsible charge related to this service. The patient expressed understanding and agreed to proceed.  Jeffrey Herring [id number] 73 y.o.  02/13/2024 11:22 AM  History of Present Illness:  Patient is evaluated by video session.  His wife was present in the session.  On the last visit we cut down the Lamictal  and he is taking 100 mg due to increase kidney function results.  His last kidney function shows creatinine 3.23 slightly better than before.  Wife reported reducing the medication does not cause worsening of symptoms.  He tends to get tired easily due to his chronic health issues.  Sometimes he has shortness of breath and does not walk but like to go outside with his wife when she is on for errands.  He is able to do ADLs.  His wife told that he is surprisingly doing much better and able to do his ADLs better than before.  He sleeps good.  Denies any mania, psychosis, hallucination.  However wife and patient are very reluctant to cut down further medication.  Patient has appointment coming up to see nephrology in few days.  He has no tremors, shakes, EPS.  As per wife he is not agitated but does get sometimes confused and sundowning.  He mixes with the time and there is a gradual decline in the memory.  His appetite is okay.  His weight is stable.  His wife concerned more about his general health, fatigue and tiredness and will address to his physician.  Patient is taking olanzapine , Xanax  and Lamictal .  Past Psychiatric History: H/O bipolar disorder. H/O inpatient mania and psychosis.  Good response  with lithium  until had a lithium  toxicity.  Took Abilify, Risperdal , Prozac , Lexapro, Wellbutrin and Navane.    Outpatient Encounter Medications as of 02/13/2024  Medication Sig   ALPRAZolam  (XANAX ) 0.5 MG tablet Take 1 tablet (0.5 mg total) by mouth 3 (three) times daily.   cholecalciferol (VITAMIN D ) 1000 units tablet Take 1,000 Units by mouth daily.   clopidogrel  (PLAVIX ) 75 MG tablet TAKE 1 TABLET (75 MG TOTAL) BY MOUTH EVERY MORNING.   cyanocobalamin  (VITAMIN B12) 1000 MCG tablet Take 1,000 mcg by mouth daily.   empagliflozin  (JARDIANCE ) 10 MG TABS tablet Take 1 tablet (10 mg total) by mouth daily before breakfast.   lamoTRIgine  (LAMICTAL ) 100 MG tablet Take 1 tablet (100 mg total) by mouth daily.   metoprolol  succinate (TOPROL -XL) 25 MG 24 hr tablet Take 25 mg by mouth daily. Wife reports pt now taking 1 full tablet   OLANZapine  (ZYPREXA ) 5 MG tablet TAKE 1 TABLET BY MOUTH ATBEDTIME.   rosuvastatin  (CRESTOR ) 40 MG tablet Take 1 tablet (40 mg total) by mouth daily.   sodium bicarbonate  650 MG tablet Take 650 mg by mouth daily.   tamsulosin  (FLOMAX ) 0.4 MG CAPS capsule TAKE 1 CAPSULE (0.4 MG TOTAL) BY MOUTH EVERY MORNING.   No facility-administered encounter medications on file as of 02/13/2024.    No results found for this or any previous visit (from the past 2160 hours).   Psychiatric Specialty Exam: Physical Exam  Review of Systems  Weight 172 lb (78  kg).There is no height or weight on file to calculate BMI.  General Appearance: Fairly Groomed and lying on bed  Eye Contact:  Fair  Speech:  Slow  Volume:  Decreased  Mood:  Euthymic and tired  Affect:  Congruent  Thought Process:  Goal Directed  Orientation:  Full (Time, Place, and Person)  Thought Content:  WDL  Suicidal Thoughts:  No  Homicidal Thoughts:  No  Memory:  Immediate;   Fair Recent;   Fair Remote;   Fair  Judgement:  Fair  Insight:  Shallow  Psychomotor Activity:  Decreased  Concentration:  Concentration:  Fair and Attention Span: Fair  Recall:  [employer] of Knowledge:  Fair  Language:  Fair  Akathisia:  No  Handed:  Right  AIMS (if indicated):     Assets:  Communication Skills Desire for Improvement Housing Social Support  ADL's:  Intact  Cognition:  Impaired,  Mild  Sleep:  sundowning        02/22/2023   10:37 AM 02/14/2023   10:04 AM 11/13/2022   10:33 AM 02/13/2022    2:30 PM 11/01/2021    3:09 PM  Depression screen PHQ 2/9  Decreased Interest 0 0 1 0 0  Down, Depressed, Hopeless 0 0 0 0 0  PHQ - 2 Score 0 0 1 0 0  Altered sleeping  0 0 0   Tired, decreased energy  0 1 0   Change in appetite  0 0 0   Feeling bad or failure about yourself   0 0 0   Trouble concentrating  0 0 0   Moving slowly or fidgety/restless  0 0 0   Suicidal thoughts  0 0 0   PHQ-9 Score  0 2 0     Assessment/Plan: Bipolar 1 disorder (HCC) - Plan: lamoTRIgine  (LAMICTAL ) 100 MG tablet, OLANZapine  (ZYPREXA ) 5 MG tablet, ALPRAZolam  (XANAX ) 0.5 MG tablet  Cognitive disorder - Plan: OLANZapine  (ZYPREXA ) 5 MG tablet  GAD (generalized anxiety disorder) - Plan: ALPRAZolam  (XANAX ) 0.5 MG tablet  Reviewed blood work results.  Patient doing better since Lamictal  cut down and his creatinine slightly improved.  Patient and his wife does not want to cut down further Lamictal .  He used to take 300 and slowly and gradually the dose is now 100.  I would emphasize to continue to consider lowering the medication the patient remained stable.  Will keep olanzapine  5 mg at bedtime, Xanax  0.5 mg 3 times a day and Lamictal  100 mg daily.  I emphasized that he need to have a blood sugar and hemoglobin A1c.  Patient wife told that they are getting metabolic panel very soon and they will send a message to have blood work results faxed to us .  I will follow-up in 6 months.  Recommend to call us  back if they have any question or any concern.   Follow Up Instructions:     I discussed the assessment and treatment plan with the  patient. The patient was provided an opportunity to ask questions and all were answered. The patient agreed with the plan and demonstrated an understanding of the instructions.   The patient was advised to call back or seek an in-person evaluation if the symptoms worsen or if the condition fails to improve as anticipated.    Collaboration of Care: Other provider involved in patient's care AEB notes are available in epic to review  Patient/Guardian was advised Release of Information must be obtained prior to any record release  in order to collaborate their care with an outside provider. Patient/Guardian was advised if they have not already done so to contact the registration department to sign all necessary forms in order for us  to release information regarding their care.   Consent: Patient/Guardian gives verbal consent for treatment and assignment of benefits for services provided during this visit. Patient/Guardian expressed understanding and agreed to proceed.     Total encounter time 24 minutes which includes face-to-face time, chart reviewed, care coordination, order entry and documentation during this encounter.   Note: This document was prepared by [employer] voice dictation technology and any errors that results from this process are unintentional.    Arturo Late, MD 02/13/2024

## 2024-02-20 ENCOUNTER — Ambulatory Visit (INDEPENDENT_AMBULATORY_CARE_PROVIDER_SITE_OTHER): Payer: Medicare Other | Admitting: Family Medicine

## 2024-02-20 ENCOUNTER — Encounter: Payer: Self-pay | Admitting: Family Medicine

## 2024-02-20 VITALS — BP 126/74 | HR 99 | Resp 16 | Ht 70.0 in | Wt 162.1 lb

## 2024-02-20 DIAGNOSIS — F319 Bipolar disorder, unspecified: Secondary | ICD-10-CM | POA: Diagnosis not present

## 2024-02-20 DIAGNOSIS — E559 Vitamin D deficiency, unspecified: Secondary | ICD-10-CM | POA: Diagnosis not present

## 2024-02-20 DIAGNOSIS — N2581 Secondary hyperparathyroidism of renal origin: Secondary | ICD-10-CM

## 2024-02-20 DIAGNOSIS — N138 Other obstructive and reflux uropathy: Secondary | ICD-10-CM | POA: Diagnosis not present

## 2024-02-20 DIAGNOSIS — Z8673 Personal history of transient ischemic attack (TIA), and cerebral infarction without residual deficits: Secondary | ICD-10-CM | POA: Diagnosis not present

## 2024-02-20 DIAGNOSIS — I495 Sick sinus syndrome: Secondary | ICD-10-CM

## 2024-02-20 DIAGNOSIS — I420 Dilated cardiomyopathy: Secondary | ICD-10-CM | POA: Diagnosis not present

## 2024-02-20 DIAGNOSIS — R634 Abnormal weight loss: Secondary | ICD-10-CM | POA: Diagnosis not present

## 2024-02-20 DIAGNOSIS — N184 Chronic kidney disease, stage 4 (severe): Secondary | ICD-10-CM | POA: Diagnosis not present

## 2024-02-20 DIAGNOSIS — E538 Deficiency of other specified B group vitamins: Secondary | ICD-10-CM | POA: Diagnosis not present

## 2024-02-20 DIAGNOSIS — I5022 Chronic systolic (congestive) heart failure: Secondary | ICD-10-CM | POA: Diagnosis not present

## 2024-02-20 DIAGNOSIS — N401 Enlarged prostate with lower urinary tract symptoms: Secondary | ICD-10-CM | POA: Diagnosis not present

## 2024-02-20 DIAGNOSIS — F09 Unspecified mental disorder due to known physiological condition: Secondary | ICD-10-CM

## 2024-02-20 NOTE — Progress Notes (Signed)
 Name: Jeffrey Herring   MRN: [id number]    DOB: [birth year]   Date:02/20/2024       Progress Note  Subjective  Chief Complaint  Chief Complaint  Patient presents with   Medical Management of Chronic Issues   Discussed the use of AI scribe software for clinical note transcription with the patient, who gave verbal consent to proceed.  History of Present Illness Jeffrey Herring is a 73 year old male with congestive heart failure and cognitive decline who presents with worsening shortness of breath and sundowning.  He experiences worsening shortness of breath, which he attributes to his congestive heart failure. His breathing has become louder, and he experiences shortness of breath more than usual. He uses one pillow at night and does not feel the need to recline more than usual. He is unable to walk long distances due to his breathing difficulties, which affects his ability to function. He denies chest pain and significant leg swelling, although he mentions knee discomfort.  He has a history of dilated cardiomyopathy, sinoatrial node dysfunction, coronary artery disease, and congestive heart failure. He had a stent placed in 2014 and has had a pacemaker since 1983, with replacements over the years. He is currently taking Plavix , rosuvastatin , Jardiance , and metoprolol  for his heart conditions.  He also reports significant cognitive decline, with symptoms of sundowning. He sleeps during the day and stays awake at night, leading to a change in his eating habits and a weight loss of 14 pounds since November. He often eats only one meal a day and craves sugary drinks. His caregiver notes that he has been like this for many years, but the pattern has worsened recently.  He has chronic kidney disease stage 4 with secondary hyperparathyroidism. His last GFR was 19, and he is taking sodium bicarbonate . His vitamin D  levels are good, and he is not anemic. He continues to take vitamin B12 due to a previously  low level.  He has a history of high PSA levels, which have been monitored by a urologist in the past. His PSA levels have decreased slightly over the years, and he is not currently seeing a urologist.  He is also being treated for bipolar disorder with lamotrigine , Zyprexa , and alprazolam . His caregiver reports that his psychiatrist occasionally adjusts his medication based on his condition.    Patient Active Problem List   Diagnosis Date Noted   B12 deficiency 08/17/2023   Vitamin D  deficiency 08/17/2023   History of CVA in adulthood 08/16/2022   Polyp of sigmoid colon    Nodule of rectum    Polyp of ascending colon    Hyperparathyroidism, secondary renal (HCC) 07/06/2020   Nasal congestion with rhinorrhea 11/06/2018   Chronic kidney disease, stage 4 (severe) (HCC) 10/24/2017   Dilated cardiomyopathy (HCC) 02/18/2017   BPH (benign prostatic hyperplasia) 11/04/2015   Chronic systolic heart failure (HCC) 10/28/2015   HLD (hyperlipidemia) 10/28/2015   H/O deep venous thrombosis 10/28/2015   Personal history of other venous thrombosis and embolism 10/28/2015   History of sepsis 04/09/2014   Inguinal hernia 01/06/2014   Fitting or adjustment of cardiac pacemaker 01/05/2014   Complete atrioventricular block (HCC) 10/14/2013   Sinoatrial node dysfunction (HCC) 10/14/2013   Kidney cysts 05/06/2013   Hypertension, benign 10/01/2012   Healed or old pulmonary embolism 09/30/2012   Artificial cardiac pacemaker 09/30/2012   History of endocarditis 09/30/2012   Bipolar 1 disorder (HCC) 05/01/2012   Arteriosclerosis of coronary artery 06/16/2009  Past Surgical History:  Procedure Laterality Date   BACK SURGERY     CARDIAC SURGERY     COLONOSCOPY WITH PROPOFOL  N/A 11/26/2020   Procedure: COLONOSCOPY WITH PROPOFOL ;  Surgeon: Irby Mannan, MD;  Location: [facility] ENDOSCOPY;  Service: Endoscopy;  Laterality: N/A;   HERNIA REPAIR Right 02/05/14   NOSE SURGERY     PACEMAKER PLACEMENT      SPINE SURGERY      Family History  Problem Relation Age of Onset   Depression Mother     Social History   Tobacco Use   Smoking status: Former    Types: Cigars    Quit date: 02/05/2018    Years since quitting: 6.0    Passive exposure: Past   Smokeless tobacco: Never   Tobacco comments:    smoking cessation materials not required  Substance Use Topics   Alcohol use: No    Alcohol/week: 0.0 standard drinks of alcohol     Current Outpatient Medications:    ALPRAZolam  (XANAX ) 0.5 MG tablet, Take 1 tablet (0.5 mg total) by mouth 3 (three) times daily., Disp: 90 tablet, Rfl: 5   cholecalciferol (VITAMIN D ) 1000 units tablet, Take 1,000 Units by mouth daily., Disp: , Rfl:    clopidogrel  (PLAVIX ) 75 MG tablet, TAKE 1 TABLET (75 MG TOTAL) BY MOUTH EVERY MORNING., Disp: 90 tablet, Rfl: 1   cyanocobalamin  (VITAMIN B12) 1000 MCG tablet, Take 1,000 mcg by mouth daily., Disp: , Rfl:    empagliflozin  (JARDIANCE ) 10 MG TABS tablet, Take 1 tablet (10 mg total) by mouth daily before breakfast., Disp: , Rfl:    lamoTRIgine  (LAMICTAL ) 100 MG tablet, Take 1 tablet (100 mg total) by mouth daily., Disp: 90 tablet, Rfl: 1   metoprolol  succinate (TOPROL -XL) 25 MG 24 hr tablet, Take 25 mg by mouth daily. Wife reports pt now taking 1 full tablet, Disp: , Rfl:    OLANZapine  (ZYPREXA ) 5 MG tablet, TAKE 1 TABLET BY MOUTH ATBEDTIME., Disp: 90 tablet, Rfl: 1   rosuvastatin  (CRESTOR ) 40 MG tablet, Take 1 tablet (40 mg total) by mouth daily., Disp: 90 tablet, Rfl: 1   sodium bicarbonate  650 MG tablet, Take 650 mg by mouth daily., Disp: , Rfl:    tamsulosin  (FLOMAX ) 0.4 MG CAPS capsule, TAKE 1 CAPSULE (0.4 MG TOTAL) BY MOUTH EVERY MORNING., Disp: 90 capsule, Rfl: 1  Allergies  Allergen Reactions   Aspirin  Hives, Shortness Of Breath and Palpitations   Vancomycin Rash    See ID note from 11/24/2008, with subsequent severe relapse December 2023 following a single dose of vanc   Codeine Nausea And Vomiting    Demerol [Meperidine] Nausea And Vomiting   Lithium       acute renal failure    I personally reviewed active problem list, medication list, allergies, family history with the patient/caregiver today.   ROS  Ten systems reviewed and is negative except as mentioned in HPI    Objective Physical Exam  CONSTITUTIONAL: Patient appears well-developed and well-nourished. No distress. HEENT: Head atraumatic, normocephalic, neck supple. CARDIOVASCULAR: Normal rate, regular rhythm and normal heart sounds. No murmur heard. No BLE edema. PULMONARY: Effort normal and breath sounds normal. No respiratory distress. Lungs clear to auscultation, no crackles. ABDOMINAL: There is no tenderness or distention. MUSCULOSKELETAL:slow shuffled gait/stable, PSYCHIATRIC: Patient has a flat affect, cooperative, some involuntary oral movements  Vitals:   02/20/24 1113  BP: 126/74  Pulse: 99  Resp: 16  SpO2: 97%  Weight: 162 lb 1.6 oz (73.5 kg)  Height:  5\' 10"  (1.778 m)    Body mass index is 23.26 kg/m.   PHQ2/9:    02/20/2024   11:13 AM 02/20/2024   11:08 AM 02/22/2023   10:37 AM 02/14/2023   10:04 AM 11/13/2022   10:33 AM  Depression screen PHQ 2/9  Decreased Interest 0 0 0 0 1  Down, Depressed, Hopeless 0 0 0 0 0  PHQ - 2 Score 0 0 0 0 1  Altered sleeping 0   0 0  Tired, decreased energy 0   0 1  Change in appetite 0   0 0  Feeling bad or failure about yourself  0   0 0  Trouble concentrating 0   0 0  Moving slowly or fidgety/restless 0   0 0  Suicidal thoughts 0   0 0  PHQ-9 Score 0   0 2  Difficult doing work/chores Not difficult at all        phq 9 is negative  Fall Risk:    02/20/2024   11:08 AM 08/17/2023   10:54 AM 02/22/2023   10:36 AM 02/14/2023   10:04 AM 11/13/2022   10:33 AM  Fall Risk   Falls in the past year? 0 0 1 0 0  Number falls in past yr: 0  0 0 0  Injury with Fall? 0  0 0 0  Risk for fall due to : No Fall Risks No Fall Risks No Fall Risks No Fall Risks No Fall Risks   Follow up Falls prevention discussed;Education provided;Falls evaluation completed Falls prevention discussed;Education provided;Falls evaluation completed Falls prevention discussed;Education provided Falls prevention discussed Falls prevention discussed      Assessment & Plan Congestive Heart Failure Chronic systolic heart failure with dilated cardiomyopathy. Ejection fraction 35-40%. Shortness of breath present, no new symptoms. Pacemaker in place due to sinoatrial node dysfunction  with no recent activations. - Continue metoprolol , rosuvastatin , and Jardiance . - Follow up with cardiologist as needed.  Chronic Kidney Disease, Stage 4 Stage 4 CKD with secondary hyperparathyroidism. GFR 19. Elevated parathyroid  hormone levels. - Continue sodium bicarbonate  and vitamin D  supplementation. - Check GFR and comprehensive metabolic panel. - Follow up with nephrologist as needed.  Cognitive Dysfunction Cognitive dysfunction with sundowning behavior. Changes in sleep patterns leading to weight loss. - Encourage regulation of sleep cycle to improve eating habits and prevent further weight loss. - Follow up with psychiatrist as needed.  Bipolar I Disorder Bipolar I disorder managed with lamotrigine , Zyprexa , and alprazolam . No recent manic episodes. - Continue current psychiatric medications. - Follow up with psychiatrist for medication management.  Weight Loss Significant weight loss from 176 lbs to 162 lbs, likely related to altered sleep and eating patterns. No thyroid  issues suspected. - Check thyroid  function. - Encourage increased caloric intake and regular meal schedule.  Vitamin D  and B12 deficiency -continue supplementation  BPH -seen by Urologist in the past - PSA high and stable - not interested on further evaluation at this time but would like to monitor PSA

## 2024-02-21 LAB — COMPREHENSIVE METABOLIC PANEL WITH GFR
AG Ratio: 1.6 (calc) (ref 1.0–2.5)
ALT: 19 U/L (ref 9–46)
AST: 17 U/L (ref 10–35)
Albumin: 4.8 g/dL (ref 3.6–5.1)
Alkaline phosphatase (APISO): 72 U/L (ref 35–144)
BUN/Creatinine Ratio: 8 (calc) (ref 6–22)
BUN: 30 mg/dL — ABNORMAL HIGH (ref 7–25)
CO2: 19 mmol/L — ABNORMAL LOW (ref 20–32)
Calcium: 10.8 mg/dL — ABNORMAL HIGH (ref 8.6–10.3)
Chloride: 102 mmol/L (ref 98–110)
Creat: 3.62 mg/dL — ABNORMAL HIGH (ref 0.70–1.28)
Globulin: 3 g/dL (ref 1.9–3.7)
Glucose, Bld: 108 mg/dL — ABNORMAL HIGH (ref 65–99)
Potassium: 4 mmol/L (ref 3.5–5.3)
Sodium: 139 mmol/L (ref 135–146)
Total Bilirubin: 0.7 mg/dL (ref 0.2–1.2)
Total Protein: 7.8 g/dL (ref 6.1–8.1)
eGFR: 17 mL/min/{1.73_m2} — ABNORMAL LOW (ref >=60)

## 2024-02-21 LAB — LIPID PANEL
Cholesterol: 138 mg/dL (ref <200)
HDL: 52 mg/dL (ref >=40)
LDL Cholesterol (Calc): 65 mg/dL
Non-HDL Cholesterol (Calc): 86 mg/dL (ref <130)
Total CHOL/HDL Ratio: 2.7 (calc) (ref <5.0)
Triglycerides: 130 mg/dL (ref <150)

## 2024-02-21 LAB — TSH: TSH: 2.6 m[IU]/L (ref 0.40–4.50)

## 2024-02-21 LAB — PSA: PSA: 14.75 ng/mL — ABNORMAL HIGH (ref <=4.00)

## 2024-02-21 LAB — B12 AND FOLATE PANEL
Folate: 9 ng/mL
Vitamin B-12: 1213 pg/mL — ABNORMAL HIGH (ref 200–1100)

## 2024-02-22 ENCOUNTER — Other Ambulatory Visit: Payer: Self-pay | Admitting: Family Medicine

## 2024-02-22 ENCOUNTER — Encounter: Payer: Self-pay | Admitting: Family Medicine

## 2024-02-22 DIAGNOSIS — Z8673 Personal history of transient ischemic attack (TIA), and cerebral infarction without residual deficits: Secondary | ICD-10-CM

## 2024-02-22 DIAGNOSIS — I495 Sick sinus syndrome: Secondary | ICD-10-CM

## 2024-02-22 DIAGNOSIS — I2510 Atherosclerotic heart disease of native coronary artery without angina pectoris: Secondary | ICD-10-CM

## 2024-02-22 DIAGNOSIS — N138 Other obstructive and reflux uropathy: Secondary | ICD-10-CM

## 2024-02-22 DIAGNOSIS — I5022 Chronic systolic (congestive) heart failure: Secondary | ICD-10-CM

## 2024-02-22 MED ORDER — TAMSULOSIN HCL 0.4 MG PO CAPS
0.4000 mg | ORAL_CAPSULE | Freq: Every morning | ORAL | 1 refills | Status: DC
Start: 2024-02-22 — End: 2024-08-21

## 2024-02-22 MED ORDER — CLOPIDOGREL BISULFATE 75 MG PO TABS
75.0000 mg | ORAL_TABLET | Freq: Every morning | ORAL | 1 refills | Status: DC
Start: 2024-02-22 — End: 2024-08-21

## 2024-02-22 MED ORDER — ROSUVASTATIN CALCIUM 40 MG PO TABS
40.0000 mg | ORAL_TABLET | Freq: Every day | ORAL | 1 refills | Status: DC
Start: 2024-02-22 — End: 2024-08-21

## 2024-02-25 ENCOUNTER — Other Ambulatory Visit: Payer: Self-pay | Admitting: Family

## 2024-02-28 ENCOUNTER — Ambulatory Visit: Payer: Self-pay

## 2024-02-28 DIAGNOSIS — Z0000 Encounter for general adult medical examination without abnormal findings: Secondary | ICD-10-CM

## 2024-02-28 NOTE — Progress Notes (Signed)
 Subjective:   Jeffrey Herring is a 73 y.o. who presents for a Medicare Wellness preventive visit.  As a reminder, Annual Wellness Visits don't include a physical exam, and some assessments may be limited, especially if this visit is performed virtually. We may recommend an in-person visit if needed.  Visit Complete: Virtual I connected with  Jeffrey Herring on 02/28/24 by a audio enabled telemedicine application and verified that I am speaking with the correct person using two identifiers.  Patient Location: Home  Provider Location: Office/Clinic  I discussed the limitations of evaluation and management by telemedicine. The patient expressed understanding and agreed to proceed.  Vital Signs: Because this visit was a virtual/telehealth visit, some criteria may be missing or patient reported. Any vitals not documented were not able to be obtained and vitals that have been documented are patient reported.  VideoDeclined- This patient declined Librarian, academic. Therefore the visit was completed with audio only.  Persons Participating in Visit: Patient.  AWV Questionnaire: No: Patient Medicare AWV questionnaire was not completed prior to this visit.  Cardiac Risk Factors include: advanced age (>36men, >38 women);hypertension;dyslipidemia;sedentary lifestyle;male gender     Objective:     There were no vitals filed for this visit. There is no height or weight on file to calculate BMI.     02/28/2024   10:10 AM 02/22/2023   10:39 AM 11/01/2021    3:10 PM 11/26/2020   10:16 AM 10/19/2020   10:54 AM 08/10/2019   12:11 PM 05/16/2019   11:56 AM  Advanced Directives  Does Patient Have a Medical Advance Directive? Yes Yes No No No Yes No  Type of Estate agent of [place];Living will Healthcare Power of [place];Living will    Healthcare Power of [place];Living will   Does patient want to make changes to medical advance directive? No -  Patient declined     No - Patient declined   Copy of Healthcare Power of Attorney in Chart? Yes - validated most recent copy scanned in chart (See row information)        Would patient like information on creating a medical advance directive?   No - Patient declined  Yes (MAU/Ambulatory/Procedural Areas - Information given) No - Patient declined No - Patient declined    Current Medications (verified) Outpatient Encounter Medications as of 02/28/2024  Medication Sig   ALPRAZolam  (XANAX ) 0.5 MG tablet Take 1 tablet (0.5 mg total) by mouth 3 (three) times daily.   cholecalciferol (VITAMIN D ) 1000 units tablet Take 1,000 Units by mouth daily.   clopidogrel  (PLAVIX ) 75 MG tablet Take 1 tablet (75 mg total) by mouth every morning.   cyanocobalamin  (VITAMIN B12) 1000 MCG tablet Take 1,000 mcg by mouth daily.   JARDIANCE  10 MG TABS tablet Take one tablet by mouth daily.   lamoTRIgine  (LAMICTAL ) 100 MG tablet Take 1 tablet (100 mg total) by mouth daily.   metoprolol  succinate (TOPROL -XL) 25 MG 24 hr tablet Take 25 mg by mouth daily. Wife reports pt now taking 1 full tablet   OLANZapine  (ZYPREXA ) 5 MG tablet TAKE 1 TABLET BY MOUTH ATBEDTIME.   rosuvastatin  (CRESTOR ) 40 MG tablet Take 1 tablet (40 mg total) by mouth daily.   sodium bicarbonate  650 MG tablet Take 650 mg by mouth daily.   tamsulosin  (FLOMAX ) 0.4 MG CAPS capsule Take 1 capsule (0.4 mg total) by mouth every morning.   No facility-administered encounter medications on file as of 02/28/2024.    Allergies (  verified) Aspirin , Vancomycin, Codeine, Lithium , and Meperidine   History: Past Medical History:  Diagnosis Date   Arrhythmia    Bipolar 1 disorder (HCC)    Depression    Diabetes insipidus (HCC)    History of DVT (deep vein thrombosis)    History of kidney stones    History of pulmonary embolism    Hyperlipemia    Lithium  toxicity    Pacemaker    Stage III chronic kidney disease (HCC)    Past Surgical History:  Procedure  Laterality Date   BACK SURGERY     CARDIAC SURGERY     COLONOSCOPY WITH PROPOFOL  N/A 11/26/2020   Procedure: COLONOSCOPY WITH PROPOFOL ;  Surgeon: Irby Mannan, MD;  Location: [facility] ENDOSCOPY;  Service: Endoscopy;  Laterality: N/A;   HERNIA REPAIR Right 02/05/14   NOSE SURGERY     PACEMAKER PLACEMENT     SPINE SURGERY     Family History  Problem Relation Age of Onset   Depression Mother    Social History   Socioeconomic History   Marital status: Married    Spouse name: Not on file   Number of children: 2   Years of education: some college   Highest education level: 12th grade  Occupational History    Employer: DISABLED  Tobacco Use   Smoking status: Former    Types: Cigars    Quit date: 02/05/2018    Years since quitting: 6.0    Passive exposure: Past   Smokeless tobacco: Never   Tobacco comments:    smoking cessation materials not required  Vaping Use   Vaping status: Never Used  Substance and Sexual Activity   Alcohol use: No    Alcohol/week: 0.0 standard drinks of alcohol   Drug use: No   Sexual activity: Never  Other Topics Concern   Not on file  Social History Narrative   Used to be a  Glass blower/designer, 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73   Lives with wife   They have two grown children       Social Drivers of Corporate investment banker Strain: Low Risk  (02/28/2024)   Overall Financial Resource Strain (CARDIA)    Difficulty of Paying Living Expenses: Not hard at all  Food Insecurity: No Food Insecurity (02/28/2024)   Hunger Vital Sign    Worried About Running Out of Food in the Last Year: Never true    Ran Out of Food in the Last Year: Never true  Transportation Needs: No Transportation Needs (02/28/2024)   PRAPARE - Administrator, Civil Service (Medical): No    Lack of Transportation (Non-Medical): No  Physical Activity: Inactive (02/28/2024)   Exercise Vital Sign    Days of Exercise per Week: 0  days    Minutes of Exercise per Session: 0 min  Stress: No Stress Concern Present (02/28/2024)   [employer] of Occupational Health - Occupational Stress Questionnaire    Feeling of Stress : Not at all  Social Connections: Moderately Isolated (02/28/2024)   Social Connection and Isolation Panel [NHANES]    Frequency of Communication with Friends and Family: Twice a week    Frequency of Social Gatherings with Friends and Family: Twice a week    Attends Religious Services: Never    Database administrator or Organizations: No    Attends Banker Meetings: Never    Marital Status: Married    Tobacco Counseling Counseling given:  Not Answered Tobacco comments: smoking cessation materials not required    Clinical Intake:  Pre-visit preparation completed: Yes  Pain : No/denies pain     BMI - recorded: 23.2 Nutritional Status: BMI of 19-24  Normal Nutritional Risks: None Diabetes: No  Lab Results  Component Value Date   HGBA1C 5.4 02/13/2022   HGBA1C 5.4 07/08/2021   HGBA1C 5.4 07/06/2020     How often do you need to have someone help you when you read instructions, pamphlets, or other written materials from your doctor or pharmacy?: 1 - Never  Interpreter Needed?: No  Information entered by :: Dellie Fergusson, LPN   Activities of Daily Living    02/28/2024   10:11 AM 08/17/2023   10:54 AM  In your present state of health, do you have any difficulty performing the following activities:  Hearing? 0 0  Vision? 0 0  Difficulty concentrating or making decisions? 1 1  Walking or climbing stairs? 0 1  Dressing or bathing? 0 1  Doing errands, shopping? 0 1  Preparing Food and eating ? N   Using the Toilet? N   In the past six months, have you accidently leaked urine? N   Do you have problems with loss of bowel control? N   Managing your Medications? Y   Managing your Finances? Y   Housekeeping or managing your Housekeeping? Y     Patient Care  Team: Sowles, Krichna, MD as PCP - General (Family Medicine) Claudius Cumins, Genevieve Kerry, MD as Referring Physician (Cardiology) Carlos Chesterfield Bronson Canny, MD as Consulting Physician (Psychiatry) Augie Bliss, NP as Nurse Practitioner (Cardiology) Lawerence Pressman, MD as Consulting Physician (Urology) Irby Mannan, MD (Inactive) (Gastroenterology)  Indicate any recent Medical Services you may have received from other than [facility] providers in the past year (date may be approximate).     Assessment:    This is a routine wellness examination for [place].  Hearing/Vision screen Hearing Screening - Comments:: NO AIDS Vision Screening - Comments:: READERS-   Goals Addressed             This Visit's Progress    DIET - EAT MORE FRUITS AND VEGETABLES         Depression Screen     02/28/2024   10:09 AM 02/20/2024   11:13 AM 02/20/2024   11:08 AM 08/17/2023   10:54 AM 02/22/2023   10:37 AM 02/14/2023   10:04 AM 11/13/2022   10:33 AM  PHQ 2/9 Scores  PHQ - 2 Score 0 0 0  0 0 1  PHQ- 9 Score 0 0    0 2  Exception Documentation    Medical reason       Fall Risk     02/28/2024   10:10 AM 02/20/2024   11:08 AM 08/17/2023   10:54 AM 02/22/2023   10:36 AM 02/14/2023   10:04 AM  Fall Risk   Falls in the past year? 0 0 0 1 0  Number falls in past yr: 0 0  0 0  Injury with Fall? 0 0  0 0  Risk for fall due to : No Fall Risks No Fall Risks No Fall Risks No Fall Risks No Fall Risks  Follow up Falls prevention discussed;Falls evaluation completed Falls prevention discussed;Education provided;Falls evaluation completed Falls prevention discussed;Education provided;Falls evaluation completed Falls prevention discussed;Education provided Falls prevention discussed    MEDICARE RISK AT HOME:  Medicare Risk at Home Any stairs in or around the home?: Yes If so,  are there any without handrails?: No Home free of loose throw rugs in walkways, pet beds, electrical cords, etc?: Yes Adequate lighting in your home  to reduce risk of falls?: Yes Life alert?: No Use of a cane, walker or w/c?: No Grab bars in the bathroom?: Yes Shower chair or bench in shower?: No Elevated toilet seat or a handicapped toilet?: No  TIMED UP AND GO:  Was the test performed?  No  Cognitive Function: 6CIT completed        02/28/2024   10:14 AM 02/22/2023   10:42 AM 10/19/2020   10:57 AM 05/16/2019   11:59 AM 04/23/2018    1:10 PM  6CIT Screen  What Year? 0 points 0 points 0 points 0 points   What month? 0 points 0 points 0 points 0 points   What time? 0 points 0 points 0 points 0 points   Count back from 20 0 points 0 points 0 points 0 points   Months in reverse 0 points 0 points 0 points 0 points   Repeat phrase 2 points 4 points 10 points 6 points   Total Score 2 points 4 points 10 points 6 points      Information is confidential and restricted. Go to Review Flowsheets to unlock data.    Immunizations Immunization History  Administered Date(s) Administered   Fluad Quad(high Dose 65+) 07/06/2020, 08/16/2022   Fluad Trivalent(High Dose 65+) 08/17/2023   Influenza, High Dose Seasonal PF 10/28/2015, 09/20/2016, 08/02/2017, 11/06/2018, 06/05/2019, 07/04/2021   Influenza, Seasonal, Injecte, Preservative Fre 07/16/2013, 05/16/2014, 08/02/2017   Influenza-Unspecified 07/16/2013, 05/16/2014, 10/28/2015, 09/20/2016, 08/02/2017, 11/06/2018   PFIZER(Purple Top)SARS-COV-2 Vaccination 12/12/2019, 01/07/2020, 09/04/2020, 07/04/2021   Pneumococcal Conjugate-13 10/28/2015   Pneumococcal Polysaccharide-23 04/23/2014, 08/06/2019   Tdap 10/27/2005    Screening Tests Health Maintenance  Topic Date Due   Zoster Vaccines- Shingrix (1 of 2) Never done   COVID-19 Vaccine (5 - 2024-25 season) 03/07/2024 (Originally 06/17/2023)   DTaP/Tdap/Td (2 - Td or Tdap) 08/16/2024 (Originally 10/28/2015)   INFLUENZA VACCINE  05/16/2024   Medicare Annual Wellness (AWV)  02/27/2025   Pneumonia Vaccine 14+ Years old  Completed   Hepatitis C  Screening  Completed   HPV VACCINES  Aged Out   Meningococcal B Vaccine  Aged Out   Lung Cancer Screening  Discontinued   Colonoscopy  Discontinued    Health Maintenance  Health Maintenance Due  Topic Date Due   Zoster Vaccines- Shingrix (1 of 2) Never done   Health Maintenance Items Addressed: NEED SHINGRIX & PNA; COLONOSCOPY UP TO DATE  Additional Screening:  Vision Screening: Recommended annual ophthalmology exams for early detection of glaucoma and other disorders of the eye.  Dental Screening: Recommended annual dental exams for proper oral hygiene  Community Resource Referral / Chronic Care Management: CRR required this visit?  No   CCM required this visit?  No   Plan:    I have personally reviewed and noted the following in the patient's chart:   Medical and social history Use of alcohol, tobacco or illicit drugs  Current medications and supplements including opioid prescriptions. Patient is not currently taking opioid prescriptions. Functional ability and status Nutritional status Physical activity Advanced directives List of other physicians Hospitalizations, surgeries, and ER visits in previous 12 months Vitals Screenings to include cognitive, depression, and falls Referrals and appointments  In addition, I have reviewed and discussed with patient certain preventive protocols, quality metrics, and best practice recommendations. A written personalized care plan for preventive services  as well as general preventive health recommendations were provided to patient.   Pinky Bright, LPN   06/13/5620   After Visit Summary: (MyChart) Due to this being a telephonic visit, the after visit summary with patients personalized plan was offered to patient via MyChart   Notes: Nothing significant to report at this time.

## 2024-02-28 NOTE — Patient Instructions (Signed)
 Jeffrey Herring , Thank you for taking time out of your busy schedule to complete your Annual Wellness Visit with me. I enjoyed our conversation and look forward to speaking with you again next year. I, as well as your care team,  appreciate your ongoing commitment to your health goals. Please review the following plan we discussed and let me know if I can assist you in the future.   Follow up Visits: Next Medicare AWV with our clinical staff: 03/06/25 @ 9:30 AM BY PHONE   Have you seen your provider in the last 6 months (3 months if uncontrolled diabetes)? Yes   Clinician Recommendations:  Aim for 30 minutes of exercise or brisk walking, 6-8 glasses of water , and 5 servings of fruits and vegetables each day. TAKE CARE!      This is a list of the screening recommended for you and due dates:  Health Maintenance  Topic Date Due   Zoster (Shingles) Vaccine (1 of 2) Never done   COVID-19 Vaccine (5 - 2024-25 season) 03/07/2024*   DTaP/Tdap/Td vaccine (2 - Td or Tdap) 08/16/2024*   Flu Shot  05/16/2024   Medicare Annual Wellness Visit  02/27/2025   Pneumonia Vaccine  Completed   Hepatitis C Screening  Completed   HPV Vaccine  Aged Out   Meningitis B Vaccine  Aged Out   Screening for Lung Cancer  Discontinued   Colon Cancer Screening  Discontinued  *Topic was postponed. The date shown is not the original due date.    Advanced directives: (In Chart) A copy of your advanced directives are scanned into your chart should your provider ever need it. Advance Care Planning is important because it:  [x]  Makes sure you receive the medical care that is consistent with your values, goals, and preferences  [x]  It provides guidance to your family and loved ones and reduces their decisional burden about whether or not they are making the right decisions based on your wishes.  Follow the link provided in your after visit summary or read over the paperwork we have mailed to you to help you started getting  your Advance Directives in place. If you need assistance in completing these, please reach out to us  so that we can help you!

## 2024-03-25 DIAGNOSIS — I10 Essential (primary) hypertension: Secondary | ICD-10-CM | POA: Diagnosis not present

## 2024-03-25 DIAGNOSIS — Z1330 Encounter for screening examination for mental health and behavioral disorders, unspecified: Secondary | ICD-10-CM | POA: Diagnosis not present

## 2024-03-25 DIAGNOSIS — I483 Typical atrial flutter: Secondary | ICD-10-CM | POA: Diagnosis not present

## 2024-03-25 DIAGNOSIS — Z950 Presence of cardiac pacemaker: Secondary | ICD-10-CM | POA: Diagnosis not present

## 2024-03-25 DIAGNOSIS — I2510 Atherosclerotic heart disease of native coronary artery without angina pectoris: Secondary | ICD-10-CM | POA: Diagnosis not present

## 2024-03-25 DIAGNOSIS — I442 Atrioventricular block, complete: Secondary | ICD-10-CM | POA: Diagnosis not present

## 2024-03-25 DIAGNOSIS — R0602 Shortness of breath: Secondary | ICD-10-CM | POA: Diagnosis not present

## 2024-04-14 DIAGNOSIS — F3181 Bipolar II disorder: Secondary | ICD-10-CM | POA: Diagnosis not present

## 2024-04-14 DIAGNOSIS — N184 Chronic kidney disease, stage 4 (severe): Secondary | ICD-10-CM | POA: Diagnosis not present

## 2024-04-15 DIAGNOSIS — I442 Atrioventricular block, complete: Secondary | ICD-10-CM | POA: Diagnosis not present

## 2024-04-15 DIAGNOSIS — Z950 Presence of cardiac pacemaker: Secondary | ICD-10-CM | POA: Diagnosis not present

## 2024-05-23 ENCOUNTER — Telehealth: Payer: Self-pay | Admitting: Family

## 2024-05-23 NOTE — Telephone Encounter (Signed)
 Called to confirm/remind patient of their appointment at the [facility] on 05/26/24.   Appointment:   [x] Confirmed  [] Left mess   [] No answer/No voice mail  [] VM Full/unable to leave message  [] Phone not in service  Patient reminded to bring all medications and/or complete list.  Confirmed patient has transportation. Gave directions, instructed to utilize valet parking.

## 2024-05-26 ENCOUNTER — Ambulatory Visit: Payer: Medicare Other | Attending: Family | Admitting: Family

## 2024-05-26 ENCOUNTER — Encounter: Payer: Self-pay | Admitting: Family

## 2024-05-26 VITALS — BP 115/80 | HR 82 | Wt 162.0 lb

## 2024-05-26 DIAGNOSIS — E785 Hyperlipidemia, unspecified: Secondary | ICD-10-CM | POA: Insufficient documentation

## 2024-05-26 DIAGNOSIS — I10 Essential (primary) hypertension: Secondary | ICD-10-CM

## 2024-05-26 DIAGNOSIS — N189 Chronic kidney disease, unspecified: Secondary | ICD-10-CM | POA: Diagnosis not present

## 2024-05-26 DIAGNOSIS — Z7902 Long term (current) use of antithrombotics/antiplatelets: Secondary | ICD-10-CM | POA: Insufficient documentation

## 2024-05-26 DIAGNOSIS — Z79899 Other long term (current) drug therapy: Secondary | ICD-10-CM | POA: Insufficient documentation

## 2024-05-26 DIAGNOSIS — Z86718 Personal history of other venous thrombosis and embolism: Secondary | ICD-10-CM | POA: Insufficient documentation

## 2024-05-26 DIAGNOSIS — I5022 Chronic systolic (congestive) heart failure: Secondary | ICD-10-CM | POA: Insufficient documentation

## 2024-05-26 DIAGNOSIS — I2510 Atherosclerotic heart disease of native coronary artery without angina pectoris: Secondary | ICD-10-CM | POA: Insufficient documentation

## 2024-05-26 DIAGNOSIS — Z87891 Personal history of nicotine dependence: Secondary | ICD-10-CM | POA: Insufficient documentation

## 2024-05-26 DIAGNOSIS — Z950 Presence of cardiac pacemaker: Secondary | ICD-10-CM | POA: Insufficient documentation

## 2024-05-26 DIAGNOSIS — E7800 Pure hypercholesterolemia, unspecified: Secondary | ICD-10-CM | POA: Diagnosis not present

## 2024-05-26 DIAGNOSIS — I2582 Chronic total occlusion of coronary artery: Secondary | ICD-10-CM | POA: Insufficient documentation

## 2024-05-26 DIAGNOSIS — Z86711 Personal history of pulmonary embolism: Secondary | ICD-10-CM | POA: Insufficient documentation

## 2024-05-26 DIAGNOSIS — I130 Hypertensive heart and chronic kidney disease with heart failure and stage 1 through stage 4 chronic kidney disease, or unspecified chronic kidney disease: Secondary | ICD-10-CM | POA: Diagnosis not present

## 2024-05-26 DIAGNOSIS — Z8673 Personal history of transient ischemic attack (TIA), and cerebral infarction without residual deficits: Secondary | ICD-10-CM | POA: Insufficient documentation

## 2024-05-26 NOTE — Progress Notes (Signed)
 [facility] Note   PCP: Sowles, Krichna, MD  Primary Cardiologist: [facility] (last seen 06/25)  Chief Complaint: shortness of breath   HPI:  Mr Levingston is a 73 y/o male with a history of sinoatrial node dysfunction, atrial flutter (ablation 01/04/23), CHB s/p abdominal pacemaker, CAD with stent in 2014, bipolar, BPH, HTN, cognitive dysfunction, CVA, CKD, hyperlipidemia, PE, previous endocarditis and chronic heart failure.   In the past, he had unsuccessful attempt at transvenous BiV ICD implantation due to occluded SVC and did not meet the criteria for a subcutaneous ICD. He had a generator change on October 06, 2022.   He has a pacemaker. First one in 1983, had some vegetation on pacemaker leads back in 2009 and sees Dr. Autry at [facility]. Pacemaker was replaced again 01/2018   Last pacemaker removed due to battery replacement done 10/03/2022.  Echo 12/12/21: EF 30-35% along with moderate MR and mild/ moderate TR.    Had lengthy admission 12/23 with severe drug rash with 80% body surface area covered with associate rhabdomyolysis. AKI with creatinine 5.9. Rash felt to be due to vancomycin. Needed dialysis. Echo 10/11/22: EF 35-40% along with trace MR.  Was in the ED 10/30/22 due to N/V.   Stress test 01/23/23: 1) SPECT Tc36m Cardiolite stress myocardial perfusion imaging showed a moderate sized, severely intense fixed defect of the inferoseptal wall, suggestive of prior infarct with no reversible ischemia  2) There was no TID.   3) The LV ejection fraction was calculated post stress at 37 %   [facility] cardiology 06/25. No meds changed  He presents today, with his wife, for a HF follow-up visit with a chief complaint of shortness of breath. Has associated fatigue, occasional dizziness, decreased appetite. Denies abdominal pain, nausea. Wife says that patient drinks a lot of fluids but unable to quantify amount. Patient more conversant today in the office.    Wife reports that patient stays in the bed 22 hours of the day. She says that they are going to the dialysis class later this week to learn about home PD.   ROS: All systems negative except as listed in HPI, PMH and Problem List.  SH:  Social History   Socioeconomic History   Marital status: Married    Spouse name: Not on file   Number of children: 2   Years of education: some college   Highest education level: 12th grade  Occupational History    Employer: DISABLED  Tobacco Use   Smoking status: Former    Types: Cigars    Quit date: 02/05/2018    Years since quitting: 6.3    Passive exposure: Past   Smokeless tobacco: Never   Tobacco comments:    smoking cessation materials not required  Vaping Use   Vaping status: Never Used  Substance and Sexual Activity   Alcohol use: No    Alcohol/week: 0.0 standard drinks of alcohol   Drug use: No   Sexual activity: Never  Other Topics Concern   Not on file  Social History Narrative   Used to be a  Glass blower/designer, 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17   Lives with wife   They have two grown children       Social Drivers of Health   Financial Resource Strain: Low Risk  (03/25/2024)   Received from [facility]   Overall Financial Resource Strain (CARDIA)    Difficulty of Paying Living Expenses:  Not hard at all  Food Insecurity: No Food Insecurity (03/25/2024)   Received from [facility]   Hunger Vital Sign    Within the past 12 months, you worried that your food would run out before you got the money to buy more.: Never true    Within the past 12 months, the food you bought just didn't last and you didn't have money to get more.: Never true  Transportation Needs: No Transportation Needs (03/25/2024)   Received from [facility]   PRAPARE - Transportation    Lack of Transportation (Medical): No    Lack of Transportation (Non-Medical): No  Physical Activity: Unknown (03/25/2024)    Received from [facility]   Exercise Vital Sign    On average, how many days per week do you engage in moderate to strenuous exercise (like a brisk walk)?: 0 days    Minutes of Exercise per Session: Not on file  Recent Concern: Physical Activity - Inactive (02/28/2024)   Exercise Vital Sign    Days of Exercise per Week: 0 days    Minutes of Exercise per Session: 0 min  Stress: No Stress Concern Present (03/25/2024)   Received from [facility] - Occupational Stress Questionnaire    Feeling of Stress : Only a little  Social Connections: Moderately Integrated (03/25/2024)   Received from [facility]   Social Network    How would you rate your social network (family, work, friends)?: Adequate participation with social networks  Recent Concern: Social Connections - Moderately Isolated (02/28/2024)   Social Connection and Isolation Panel    Frequency of Communication with Friends and Family: Twice a week    Frequency of Social Gatherings with Friends and Family: Twice a week    Attends Religious Services: Never    Database administrator or Organizations: No    Attends Banker Meetings: Never    Marital Status: Married  Catering manager Violence: Not At Risk (03/25/2024)   Received from [facility]   HITS    Over the last 12 months how often did your partner physically hurt you?: Never    Over the last 12 months how often did your partner insult you or talk down to you?: Never    Over the last 12 months how often did your partner threaten you with physical harm?: Never    Over the last 12 months how often did your partner scream or curse at you?: Never    FH:  Family History  Problem Relation Age of Onset   Depression Mother     Past Medical History:  Diagnosis Date   Arrhythmia    Bipolar 1 disorder (HCC)    Depression    Diabetes insipidus (HCC)    History of DVT (deep vein thrombosis)    History of kidney stones     History of pulmonary embolism    Hyperlipemia    Lithium  toxicity    Pacemaker    Stage III chronic kidney disease (HCC)     Current Outpatient Medications  Medication Sig Dispense Refill   ALPRAZolam  (XANAX ) 0.5 MG tablet Take 1 tablet (0.5 mg total) by mouth 3 (three) times daily. 90 tablet 5   cholecalciferol (VITAMIN D ) 1000 units tablet Take 1,000 Units by mouth daily.     clopidogrel  (PLAVIX ) 75 MG tablet Take 1 tablet (75 mg total) by mouth every morning. 90 tablet 1   cyanocobalamin  (VITAMIN B12) 1000 MCG tablet Take  1,000 mcg by mouth daily.     JARDIANCE  10 MG TABS tablet Take one tablet by mouth daily. 90 tablet 1   lamoTRIgine  (LAMICTAL ) 100 MG tablet Take 1 tablet (100 mg total) by mouth daily. 90 tablet 1   metoprolol  succinate (TOPROL -XL) 25 MG 24 hr tablet Take 25 mg by mouth daily. Wife reports pt now taking 1 full tablet     OLANZapine  (ZYPREXA ) 5 MG tablet TAKE 1 TABLET BY MOUTH ATBEDTIME. 90 tablet 1   rosuvastatin  (CRESTOR ) 40 MG tablet Take 1 tablet (40 mg total) by mouth daily. 90 tablet 1   sodium bicarbonate  650 MG tablet Take 650 mg by mouth daily.     tamsulosin  (FLOMAX ) 0.4 MG CAPS capsule Take 1 capsule (0.4 mg total) by mouth every morning. 90 capsule 1   No current facility-administered medications for this visit.   Vitals:   05/26/24 1044  BP: 115/80  Pulse: 82  SpO2: 93%  Weight: 162 lb (73.5 kg)   Wt Readings from Last 3 Encounters:  05/26/24 162 lb (73.5 kg)  02/20/24 162 lb 1.6 oz (73.5 kg)  11/26/23 172 lb (78 kg)   Lab Results  Component Value Date   CREATININE 3.62 (H) 02/20/2024   CREATININE 3.39 (H) 06/04/2023   CREATININE 3.01 (H) 05/02/2023    PHYSICAL EXAM:  General: Well appearing.  Cor: No JVD. Regular rhythm, rate.  Lungs: clear Abdomen: soft, nontender, nondistended. Extremities: no edema Neuro:. Affect pleasant. Answering questions appropriately   ECG: not done   ASSESSMENT & PLAN:  1: Chronic ischemic heart  failure with reduced ejection fraction- - NYHA class III - euvolemic - weighing daily; reminded to call for overnight weight gain of > 2 pounds or a weekly weight gain of > 5 pounds - weight down 10 pounds from last visit here 6 months ago. Follow w/ PCP regarding weight loss.  - Echo 12/12/21: EF 30-35% along with moderate MR and mild/ moderate TR.  - Echo 10/11/22: EF 35-40% along with trace MR. - updated echo ordered - continue jardiance  10mg  daily - continue metoprolol  succinate 25mg  daily - CKD will not tolerate MRA or ARNi / ARB - BNP 10/10/22 was 6434  2: HTN- - BP 115/80 - saw PCP Eugene) 05/25 - BMP 02/20/24 reviewed and showed sodium 139, potassium 4.0, creatinine 3.62 and GFR 17 - saw nephrology Wess) 06/25  3: CAD- - saw cardiology ([facility]) 06/25 - continue clopidogrel  75mg  daily - continue rosuvastatin  40mg  daily - CTO of RCA in 2014 -Stress test 01/23/23: 1) SPECT Tc99m Cardiolite stress myocardial perfusion imaging showed a moderate sized, severely intense fixed defect of the inferoseptal wall, suggestive of prior infarct with no reversible ischemia  2) There was no TID.   3) The LV ejection fraction was calculated post stress at 37 %   4: Hy;erlipidemia- - LDL 02/20/24 was 65 - continue rosuvastatin  40mg  daily  5: Hypercalcemia- - Calcium  10.8 on 02/20/24 - appears that his trend over the last several months has been usually mid-upper 10's to low 11's - managed by PCP    Return in 6 months, sooner if needed.   Ellouise DELENA Class, FNP 05/26/24

## 2024-05-26 NOTE — Patient Instructions (Signed)
 Medication Changes:  No medication changes today!  Testing/Procedures:  Your physician has requested that you have an echocardiogram. Echocardiography is a painless test that uses sound waves to create images of your heart. It provides your doctor with information about the size and shape of your heart and how well your heart's chambers and valves are working. This procedure takes approximately one hour. There are no restrictions for this procedure. Please do NOT wear cologne, perfume, aftershave, or lotions (deodorant is allowed). Please arrive 15 minutes prior to your appointment time.  Please note: We ask at that you not bring children with you during ultrasound (echo/ vascular) testing. Due to room size and safety concerns, children are not allowed in the ultrasound rooms during exams. Our front office staff cannot provide observation of children in our lobby area while testing is being conducted. An adult accompanying a patient to their appointment will only be allowed in the ultrasound room at the discretion of the ultrasound technician under special circumstances. We apologize for any inconvenience.  [facility] will be in contact with you in order to schedule your appointment for an echocardiogram.    Follow-Up in: Please follow up with the [facility] in 6 months with Ellouise Class, FNP.   Thank you for choosing [facility].    At the [facility], you and your health needs are our priority. We have a designated team specialized in the treatment of Heart Failure. This Care Team includes your primary Heart Failure Specialized Cardiologist (physician), Advanced Practice Providers (APPs- Physician Assistants and Nurse Practitioners), and Pharmacist who all work together to provide you with the care you need, when you need it.   You may see any of the following providers on your designated Care Team at your next  follow up:  Dr. Toribio Fuel Dr. Ezra Shuck Dr. Ria Commander Dr. Morene Brownie Ellouise Class, FNP Jaun Bash, RPH-CPP  Please be sure to bring in all your medications bottles to every appointment.   Need to Contact Us :  If you have any questions or concerns before your next appointment please send us  a message through [place] or call our office at [phone].    TO LEAVE A MESSAGE FOR THE NURSE SELECT OPTION 2, PLEASE LEAVE A MESSAGE INCLUDING: YOUR NAME DATE OF BIRTH CALL BACK NUMBER REASON FOR CALL**this is important as we prioritize the call backs  YOU WILL RECEIVE A CALL BACK THE SAME DAY AS LONG AS YOU CALL BEFORE 4:00 PM

## 2024-07-15 DIAGNOSIS — Z950 Presence of cardiac pacemaker: Secondary | ICD-10-CM | POA: Diagnosis not present

## 2024-07-15 DIAGNOSIS — I442 Atrioventricular block, complete: Secondary | ICD-10-CM | POA: Diagnosis not present

## 2024-07-17 ENCOUNTER — Ambulatory Visit: Attending: Family

## 2024-07-17 DIAGNOSIS — I5022 Chronic systolic (congestive) heart failure: Secondary | ICD-10-CM | POA: Diagnosis not present

## 2024-07-17 LAB — ECHOCARDIOGRAM COMPLETE
AV Mean grad: 2 mmHg
AV Peak grad: 3.5 mmHg
Ao pk vel: 0.94 m/s
Area-P 1/2: 3.08 cm2
Calc EF: 27.3 %
S' Lateral: 4.3 cm
Single Plane A2C EF: 23 %
Single Plane A4C EF: 34.9 %

## 2024-08-01 ENCOUNTER — Ambulatory Visit (HOSPITAL_COMMUNITY): Payer: Self-pay | Admitting: Family Medicine

## 2024-08-14 ENCOUNTER — Encounter (HOSPITAL_COMMUNITY): Payer: Self-pay | Admitting: Psychiatry

## 2024-08-14 ENCOUNTER — Telehealth (HOSPITAL_COMMUNITY): Admitting: Psychiatry

## 2024-08-14 VITALS — Wt 162.0 lb

## 2024-08-14 DIAGNOSIS — F319 Bipolar disorder, unspecified: Secondary | ICD-10-CM

## 2024-08-14 DIAGNOSIS — F411 Generalized anxiety disorder: Secondary | ICD-10-CM

## 2024-08-14 MED ORDER — OLANZAPINE 5 MG PO TABS
ORAL_TABLET | ORAL | 1 refills | Status: AC
Start: 2024-08-14 — End: ?

## 2024-08-14 MED ORDER — ALPRAZOLAM 0.5 MG PO TABS
0.5000 mg | ORAL_TABLET | Freq: Three times a day (TID) | ORAL | 5 refills | Status: AC
Start: 2024-08-14 — End: ?

## 2024-08-14 MED ORDER — LAMOTRIGINE 25 MG PO TABS
50.0000 mg | ORAL_TABLET | Freq: Every day | ORAL | 5 refills | Status: AC
Start: 2024-08-14 — End: ?

## 2024-08-14 NOTE — Progress Notes (Signed)
 [facility] MD Virtual Progress Note   Patient Location: Home Provider Location: Home Office  I connect with patient by video and verified that I am speaking with correct person by using two identifiers. I discussed the limitations of evaluation and management by telemedicine and the availability of in person appointments. I also discussed with the patient that there may be a patient responsible charge related to this service. The patient expressed understanding and agreed to proceed.  Jeffrey Herring [id number] 73 y.o.  08/14/2024 10:17 AM  History of Present Illness:  Patient is evaluated by video session.  His wife helped him during the session.  Patient was lying in the bed but able to answer the question.  Wife reported that he does not leave the house and sleeps too much but his mood is stable.  He does not get agitated, irritable, angry.  She reported sleeps too much.  He had a blood work in May and creatinine slightly jumped to 3.62 from 3.0.  Wife reported they are talking about dialysis but it is very preliminary and no set date and not discussed about fistula or shunt lately.  Patient denies any hallucination, paranoia or any crying spells.  He denies any suicidal thoughts.  He is taking olanzapine , Xanax  and Lamictal  100 mg.  He has no rash or any itching.  He used to take 300 mg of Lamictal  but slowly and gradually dose has been reduced.  He enjoy the time of the granddaughter who comes regularly.  Patient admitted due to his energy level he does not go outside but also does not feel sad.  He admitted some anxiety related to his general physical health.  Sometimes he is forgetful but he had a good support from his wife who takes him to the doctor's appointment.  He has no rash, tremors or itching.  He denies drinking or using any illegal substances.  Past Psychiatric History: H/O bipolar disorder. H/O inpatient mania and psychosis.  Good response with lithium  until had a  lithium  toxicity.  Took Abilify, Risperdal , Prozac , Lexapro, Wellbutrin and Navane.   Past Medical History:  Diagnosis Date   Arrhythmia    Bipolar 1 disorder (HCC)    Depression    Diabetes insipidus    History of DVT (deep vein thrombosis)    History of kidney stones    History of pulmonary embolism    Hyperlipemia    Lithium  toxicity    Pacemaker    Stage III chronic kidney disease (HCC)     Outpatient Encounter Medications as of 08/14/2024  Medication Sig   ALPRAZolam  (XANAX ) 0.5 MG tablet Take 1 tablet (0.5 mg total) by mouth 3 (three) times daily.   cholecalciferol (VITAMIN D ) 1000 units tablet Take 1,000 Units by mouth daily.   clopidogrel  (PLAVIX ) 75 MG tablet Take 1 tablet (75 mg total) by mouth every morning.   cyanocobalamin  (VITAMIN B12) 1000 MCG tablet Take 1,000 mcg by mouth daily.   JARDIANCE  10 MG TABS tablet Take one tablet by mouth daily.   lamoTRIgine  (LAMICTAL ) 100 MG tablet Take 1 tablet (100 mg total) by mouth daily.   metoprolol  succinate (TOPROL -XL) 25 MG 24 hr tablet Take 25 mg by mouth daily. Wife reports pt now taking 1 full tablet   OLANZapine  (ZYPREXA ) 5 MG tablet TAKE 1 TABLET BY MOUTH ATBEDTIME.   rosuvastatin  (CRESTOR ) 40 MG tablet Take 1 tablet (40 mg total) by mouth daily.   sodium bicarbonate  650 MG tablet Take 650 mg  by mouth daily.   tamsulosin  (FLOMAX ) 0.4 MG CAPS capsule Take 1 capsule (0.4 mg total) by mouth every morning.   No facility-administered encounter medications on file as of 08/14/2024.    Recent Results (from the past 2160 hours)  ECHOCARDIOGRAM COMPLETE     Status: None   Collection Time: 07/17/24 11:10 AM  Result Value Ref Range   AV Peak grad 3.5 mmHg   Ao pk vel 0.94 m/s   S' Lateral 4.30 cm   Area-P 1/2 3.08 cm2   AV Mean grad 2.0 mmHg   Single Plane A4C EF 34.9 %   Single Plane A2C EF 23.0 %   Calc EF 27.3 %   Est EF 25 - 30%      Psychiatric Specialty Exam: Physical Exam  Review of Systems  Weight 162 lb  (73.5 kg).There is no height or weight on file to calculate BMI.  General Appearance: Fairly Groomed and lying on bed  Eye Contact:  Fair  Speech:  Slow  Volume:  Decreased  Mood:  tired  Affect:  Congruent  Thought Process:  Descriptions of Associations: Intact  Orientation:  Full (Time, Place, and Person)  Thought Content:  WDL  Suicidal Thoughts:  No  Homicidal Thoughts:  No  Memory:  Immediate;   Fair Recent;   Fair Remote;   Fair  Judgement:  Fair  Insight:  Present  Psychomotor Activity:  Decreased  Concentration:  Concentration: Fair and Attention Span: Fair  Recall:  [employer] of Knowledge:  Fair  Language:  Fair  Akathisia:  No  Handed:  Right  AIMS (if indicated):     Assets:  Communication Skills Desire for Improvement Housing Social Support  ADL's:  Intact  Cognition:  WNL  Sleep:  too muck       02/28/2024   10:09 AM 02/20/2024   11:13 AM 02/20/2024   11:08 AM 02/22/2023   10:37 AM 02/14/2023   10:04 AM  Depression screen PHQ 2/9  Decreased Interest 0 0 0 0 0  Down, Depressed, Hopeless 0 0 0 0 0  PHQ - 2 Score 0 0 0 0 0  Altered sleeping 0 0   0  Tired, decreased energy 0 0   0  Change in appetite 0 0   0  Feeling bad or failure about yourself  0 0   0  Trouble concentrating 0 0   0  Moving slowly or fidgety/restless 0 0   0  Suicidal thoughts 0 0   0  PHQ-9 Score 0 0   0  Difficult doing work/chores Not difficult at all Not difficult at all       Assessment/Plan: Bipolar 1 disorder (HCC) - Plan: lamoTRIgine  (LAMICTAL ) 25 MG tablet, ALPRAZolam  (XANAX ) 0.5 MG tablet, OLANZapine  (ZYPREXA ) 5 MG tablet  GAD (generalized anxiety disorder) - Plan: ALPRAZolam  (XANAX ) 0.5 MG tablet  Patient is 73 year old married man with history of CHF, hypertension, cardiomyopathy, chronic kidney disease and last creatinine 3.62 and BUN 30.  He is on olanzapine , Xanax  and Lamictal .  Discussed underlying mood disorder and anxiety.  Symptoms are stable and not having any  mania, psychosis or agitation.  Recommend to cut down further Lamictal  and take only 50 mg and eventually take him off from the Lamictal  and keep olanzapine  5 mg and Xanax  0.5 mg 3 times a day.  The wife is somewhat reluctant but agree to give a try.  In the past his symptoms come back when we  cut down the Lamictal  but now he is very tired and exhausted and stays to himself.  I encouraged to call back if reducing the Lamictal  noticed worsening of symptoms.  Encouraged to keep appointment with nephrologist and other provider.  He will try Lamictal  50 mg daily, olanzapine  5 mg at bedtime and Xanax  0.5 mg 3 times a day.  Will follow-up in 6 months or sooner if needed.   Follow Up Instructions:     I discussed the assessment and treatment plan with the patient. The patient was provided an opportunity to ask questions and all were answered. The patient agreed with the plan and demonstrated an understanding of the instructions.   The patient was advised to call back or seek an in-person evaluation if the symptoms worsen or if the condition fails to improve as anticipated.    Collaboration of Care: Other provider involved in patient's care AEB notes are available in epic to review  Patient/Guardian was advised Release of Information must be obtained prior to any record release in order to collaborate their care with an outside provider. Patient/Guardian was advised if they have not already done so to contact the registration department to sign all necessary forms in order for us  to release information regarding their care.   Consent: Patient/Guardian gives verbal consent for treatment and assignment of benefits for services provided during this visit. Patient/Guardian expressed understanding and agreed to proceed.     Total encounter time 15 minutes which includes face-to-face time, chart reviewed, care coordination, order entry and documentation during this encounter.   Note: This document was  prepared by [employer] voice dictation technology and any errors that results from this process are unintentional.    Leni ONEIDA Client, MD 08/14/2024

## 2024-08-18 ENCOUNTER — Telehealth: Payer: Self-pay | Admitting: Family

## 2024-08-18 NOTE — Telephone Encounter (Signed)
 Called to confirm/remind patient of their appointment at the [facility] on 08/19/24.   Appointment:   [x] Confirmed  [] Left mess   [] No answer/No voice mail  [] VM Full/unable to leave message  [] Phone not in service  Patient reminded to bring all medications and/or complete list.  Confirmed patient has transportation. Gave directions, instructed to utilize valet parking.

## 2024-08-19 ENCOUNTER — Ambulatory Visit: Attending: Family | Admitting: Family

## 2024-08-19 ENCOUNTER — Encounter: Payer: Self-pay | Admitting: Family

## 2024-08-19 VITALS — BP 124/75 | HR 88 | Wt 165.0 lb

## 2024-08-19 DIAGNOSIS — Z79899 Other long term (current) drug therapy: Secondary | ICD-10-CM | POA: Diagnosis not present

## 2024-08-19 DIAGNOSIS — F319 Bipolar disorder, unspecified: Secondary | ICD-10-CM | POA: Diagnosis not present

## 2024-08-19 DIAGNOSIS — N186 End stage renal disease: Secondary | ICD-10-CM | POA: Insufficient documentation

## 2024-08-19 DIAGNOSIS — N400 Enlarged prostate without lower urinary tract symptoms: Secondary | ICD-10-CM | POA: Diagnosis not present

## 2024-08-19 DIAGNOSIS — Z87891 Personal history of nicotine dependence: Secondary | ICD-10-CM | POA: Insufficient documentation

## 2024-08-19 DIAGNOSIS — I130 Hypertensive heart and chronic kidney disease with heart failure and stage 1 through stage 4 chronic kidney disease, or unspecified chronic kidney disease: Secondary | ICD-10-CM | POA: Diagnosis not present

## 2024-08-19 DIAGNOSIS — Z8673 Personal history of transient ischemic attack (TIA), and cerebral infarction without residual deficits: Secondary | ICD-10-CM | POA: Diagnosis not present

## 2024-08-19 DIAGNOSIS — Z955 Presence of coronary angioplasty implant and graft: Secondary | ICD-10-CM | POA: Diagnosis not present

## 2024-08-19 DIAGNOSIS — Z950 Presence of cardiac pacemaker: Secondary | ICD-10-CM | POA: Insufficient documentation

## 2024-08-19 DIAGNOSIS — I10 Essential (primary) hypertension: Secondary | ICD-10-CM

## 2024-08-19 DIAGNOSIS — Z23 Encounter for immunization: Secondary | ICD-10-CM | POA: Diagnosis not present

## 2024-08-19 DIAGNOSIS — Z8679 Personal history of other diseases of the circulatory system: Secondary | ICD-10-CM | POA: Diagnosis not present

## 2024-08-19 DIAGNOSIS — I2510 Atherosclerotic heart disease of native coronary artery without angina pectoris: Secondary | ICD-10-CM | POA: Insufficient documentation

## 2024-08-19 DIAGNOSIS — I5020 Unspecified systolic (congestive) heart failure: Secondary | ICD-10-CM

## 2024-08-19 DIAGNOSIS — Z7902 Long term (current) use of antithrombotics/antiplatelets: Secondary | ICD-10-CM | POA: Diagnosis not present

## 2024-08-19 DIAGNOSIS — E785 Hyperlipidemia, unspecified: Secondary | ICD-10-CM | POA: Diagnosis not present

## 2024-08-19 DIAGNOSIS — Z992 Dependence on renal dialysis: Secondary | ICD-10-CM | POA: Insufficient documentation

## 2024-08-19 DIAGNOSIS — I5022 Chronic systolic (congestive) heart failure: Secondary | ICD-10-CM | POA: Diagnosis not present

## 2024-08-19 NOTE — Progress Notes (Cosign Needed Addendum)
 Advanced Heart Failure Clinic Note   PCP: Sowles, Krichna, MD  Primary Cardiologist: [facility] (last seen 06/25)  Chief Complaint: Echo Follow-Up    HPI:  Mr Jeffrey Herring is a 73 y/o male with a history of sinoatrial node dysfunction, atrial flutter (ablation 01/04/23), CHB s/p abdominal pacemaker, CAD with stent in 2014, bipolar, BPH, HTN, cognitive dysfunction, CVA, CKD, hyperlipidemia, PE, previous endocarditis and chronic heart failure.   In the past, he had unsuccessful attempt at transvenous BiV ICD implantation due to occluded SVC and did not meet the criteria for a subcutaneous ICD. He had a generator change on October 06, 2022.   He has a pacemaker. First one in 1983, had some vegetation on pacemaker leads back in 2009 and sees Dr. Autry at [facility]. Pacemaker was replaced again 01/2018   Last pacemaker removed due to battery replacement done 10/03/2022.  Echo 12/12/21: EF 30-35% along with moderate MR and mild/ moderate TR.    Had lengthy admission 12/23 with severe drug rash with 80% body surface area covered with associate rhabdomyolysis. AKI with creatinine 5.9. Rash felt to be due to vancomycin. Needed dialysis. Echo 10/11/22: EF 35-40% along with trace MR.  Was in the ED 10/30/22 due to N/V.   Stress test 01/23/23: 1) SPECT Tc42m Cardiolite stress myocardial perfusion imaging showed a moderate sized, severely intense fixed defect of the inferoseptal wall, suggestive of prior infarct with no reversible ischemia  2) There was no TID.   3) The LV ejection fraction was calculated post stress at 37 %   [facility] cardiology 06/25. No meds changed  Echo 07/17/24 EF 25-30%, LV grade I dysfunction, RV not well visualized.   He presents today, with his wife, for a HF follow-up visit post echo. Wife provides most of patients history but he is mildy engaged today. Endorses chronic exertional SHOB, fatigue, occasional dizziness (positional), and decreased appetite.  Denies chest  pain, edema, and cough. Overall, no worsening or new symptoms.  Wife believes most symptoms are attributed to inactivity. She continues to report he is in the bed all day. Says that patient drinks a lot of fluids, uses 16 oz red cups, drinks 6 cups a day. Primarily eats frozen dinners but NAS to meals.   Has completed dialysis class but has not started treatment. Sees PCP next week.   ROS: All systems negative except as listed in HPI, PMH and Problem List.  SH:  Social History   Socioeconomic History   Marital status: Married    Spouse name: Not on file   Number of children: 2   Years of education: some college   Highest education level: 12th grade  Occupational History    Employer: DISABLED  Tobacco Use   Smoking status: Former    Types: Cigars    Quit date: 02/05/2018    Years since quitting: 6.5    Passive exposure: Past   Smokeless tobacco: Never   Tobacco comments:    smoking cessation materials not required  Vaping Use   Vaping status: Never Used  Substance and Sexual Activity   Alcohol use: No    Alcohol/week: 0.0 standard drinks of alcohol   Drug use: No   Sexual activity: Never  Other Topics Concern   Not on file  Social History Narrative   Used to be a  glass blower/designer, 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8   Lives with wife   They have two grown children  Social Drivers of Corporate Investment Banker Strain: Low Risk  (03/25/2024)   Received from [facility]   Overall Financial Resource Strain (CARDIA)    Difficulty of Paying Living Expenses: Not hard at all  Food Insecurity: No Food Insecurity (03/25/2024)   Received from [facility]   Hunger Vital Sign    Within the past 12 months, you worried that your food would run out before you got the money to buy more.: Never true    Within the past 12 months, the food you bought just didn't last and you didn't have money to get more.: Never true   Transportation Needs: No Transportation Needs (03/25/2024)   Received from [facility] - Transportation    Lack of Transportation (Medical): No    Lack of Transportation (Non-Medical): No  Physical Activity: Unknown (03/25/2024)   Received from [facility]   Exercise Vital Sign    On average, how many days per week do you engage in moderate to strenuous exercise (like a brisk walk)?: 0 days    Minutes of Exercise per Session: Not on file  Recent Concern: Physical Activity - Inactive (02/28/2024)   Exercise Vital Sign    Days of Exercise per Week: 0 days    Minutes of Exercise per Session: 0 min  Stress: No Stress Concern Present (03/25/2024)   Received from [facility] - Occupational Stress Questionnaire    Feeling of Stress : Only a little  Social Connections: Moderately Integrated (03/25/2024)   Received from [facility]   Social Network    How would you rate your social network (family, work, friends)?: Adequate participation with social networks  Recent Concern: Social Connections - Moderately Isolated (02/28/2024)   Social Connection and Isolation Panel    Frequency of Communication with Friends and Family: Twice a week    Frequency of Social Gatherings with Friends and Family: Twice a week    Attends Religious Services: Never    Database Administrator or Organizations: No    Attends Banker Meetings: Never    Marital Status: Married  Catering Manager Violence: Not At Risk (03/25/2024)   Received from [facility]   HITS    Over the last 12 months how often did your partner physically hurt you?: Never    Over the last 12 months how often did your partner insult you or talk down to you?: Never    Over the last 12 months how often did your partner threaten you with physical harm?: Never    Over the last 12 months how often did your partner scream or curse at you?: Never    FH:  Family History  Problem  Relation Age of Onset   Depression Mother     Past Medical History:  Diagnosis Date   Arrhythmia    Bipolar 1 disorder (HCC)    Depression    Diabetes insipidus    History of DVT (deep vein thrombosis)    History of kidney stones    History of pulmonary embolism    Hyperlipemia    Lithium  toxicity    Pacemaker    Stage III chronic kidney disease (HCC)     Current Outpatient Medications  Medication Sig Dispense Refill   ALPRAZolam  (XANAX ) 0.5 MG tablet Take 1 tablet (0.5 mg total) by mouth 3 (three) times daily. 90 tablet 5   cholecalciferol (VITAMIN D ) 1000 units tablet Take 1,000 Units by  mouth daily.     clopidogrel  (PLAVIX ) 75 MG tablet Take 1 tablet (75 mg total) by mouth every morning. 90 tablet 1   cyanocobalamin  (VITAMIN B12) 1000 MCG tablet Take 1,000 mcg by mouth daily.     JARDIANCE  10 MG TABS tablet Take one tablet by mouth daily. 90 tablet 1   lamoTRIgine  (LAMICTAL ) 25 MG tablet Take 2 tablets (50 mg total) by mouth daily. 60 tablet 5   metoprolol  succinate (TOPROL -XL) 25 MG 24 hr tablet Take 25 mg by mouth daily. Wife reports pt now taking 1 full tablet     OLANZapine  (ZYPREXA ) 5 MG tablet TAKE 1 TABLET BY MOUTH ATBEDTIME. 90 tablet 1   rosuvastatin  (CRESTOR ) 40 MG tablet Take 1 tablet (40 mg total) by mouth daily. 90 tablet 1   sodium bicarbonate  650 MG tablet Take 650 mg by mouth daily.     tamsulosin  (FLOMAX ) 0.4 MG CAPS capsule Take 1 capsule (0.4 mg total) by mouth every morning. 90 capsule 1   No current facility-administered medications for this visit.   Vitals:   08/19/24 1055  BP: 124/75  Pulse: 88  SpO2: 93%  Weight: 74.8 kg   Wt Readings from Last 3 Encounters:  08/19/24 74.8 kg  05/26/24 73.5 kg  02/20/24 73.5 kg   Lab Results  Component Value Date   CREATININE 3.62 (H) 02/20/2024   CREATININE 3.39 (H) 06/04/2023   CREATININE 3.01 (H) 05/02/2023    PHYSICAL EXAM:  General: Well appearing.  Cor: No JVD. Regular rhythm, rate.  Lungs:  clear Abdomen: soft, nontender, nondistended. Extremities: no edema Neuro:. Affect pleasant. Answering questions appropriately   ECG: not done   ASSESSMENT & PLAN:  1: Chronic ischemic heart failure with reduced ejection fraction- - NYHA class III - euvolemic - weighing daily; reminded to call for overnight weight gain of > 2 pounds or a weekly weight gain of > 5 pounds - weight down 10 pounds from last visit here 6 months ago. Follow w/ PCP regarding weight loss.  - Echo 12/12/21: EF 30-35% along with moderate MR and mild/ moderate TR.  - Echo 10/11/22: EF 35-40% along with trace MR. - Echo 07/17/24 EF 25-30%, LV grade I dysfunction, RV not well visualized.  - Discussed risk and benefits of cMRI given patient current state, wife declined MRI.  - continue jardiance  10mg  daily - continue metoprolol  succinate 25mg  daily - CKD will not tolerate MRA or ARNi / ARB - BNP 10/10/22 was 6434  2: HTN- - BP 1124/75 - saw PCP Eugene) 05/25; return 11/25 - BMP 02/20/24 reviewed and showed sodium 139, potassium 4.0, creatinine 3.62 and GFR 17 - Will have BMET next week with PCP  - saw nephrology Wess) 06/25  3: CAD- - saw cardiology ([facility]) 06/25 - continue clopidogrel  75mg  daily - continue rosuvastatin  40mg  daily - CTO of RCA in 2014 -Stress test 01/23/23: 1) SPECT Tc99m Cardiolite stress myocardial perfusion imaging showed a moderate sized, severely intense fixed defect of the inferoseptal wall, suggestive of prior infarct with no reversible ischemia  2) There was no TID.   3) The LV ejection fraction was calculated post stress at 37 %   4: Hyperlipidemia- - LDL 02/20/24 was 65 - continue rosuvastatin  40mg  daily  5: Hypercalcemia- - Calcium  10.8 on 02/20/24 - appears that his trend over the last several months has been usually mid-upper 10's to low 11's - managed by PCP    Return in 3-4 months, sooner if needed.  Ellouise Class, FNP / Solomon Audrey Thull, FNP-S 08/19/24

## 2024-08-21 ENCOUNTER — Other Ambulatory Visit: Payer: Self-pay

## 2024-08-21 DIAGNOSIS — N138 Other obstructive and reflux uropathy: Secondary | ICD-10-CM

## 2024-08-21 DIAGNOSIS — I495 Sick sinus syndrome: Secondary | ICD-10-CM

## 2024-08-21 DIAGNOSIS — Z8673 Personal history of transient ischemic attack (TIA), and cerebral infarction without residual deficits: Secondary | ICD-10-CM

## 2024-08-21 DIAGNOSIS — I2510 Atherosclerotic heart disease of native coronary artery without angina pectoris: Secondary | ICD-10-CM

## 2024-08-21 DIAGNOSIS — I5022 Chronic systolic (congestive) heart failure: Secondary | ICD-10-CM

## 2024-08-21 MED ORDER — CLOPIDOGREL BISULFATE 75 MG PO TABS
75.0000 mg | ORAL_TABLET | Freq: Every morning | ORAL | 0 refills | Status: DC
Start: 2024-08-21 — End: 2024-08-26

## 2024-08-21 MED ORDER — ROSUVASTATIN CALCIUM 40 MG PO TABS
40.0000 mg | ORAL_TABLET | Freq: Every day | ORAL | 0 refills | Status: DC
Start: 2024-08-21 — End: 2024-08-26

## 2024-08-21 MED ORDER — TAMSULOSIN HCL 0.4 MG PO CAPS: 0.4000 mg | ORAL_CAPSULE | Freq: Every morning | ORAL | 0 refills | Status: DC

## 2024-08-26 ENCOUNTER — Encounter: Payer: Self-pay | Admitting: Family Medicine

## 2024-08-26 ENCOUNTER — Ambulatory Visit: Admitting: Family Medicine

## 2024-08-26 VITALS — BP 110/70 | HR 80 | Resp 18 | Ht 70.0 in | Wt 163.9 lb

## 2024-08-26 DIAGNOSIS — N2581 Secondary hyperparathyroidism of renal origin: Secondary | ICD-10-CM

## 2024-08-26 DIAGNOSIS — N138 Other obstructive and reflux uropathy: Secondary | ICD-10-CM | POA: Diagnosis not present

## 2024-08-26 DIAGNOSIS — Z95811 Presence of heart assist device: Secondary | ICD-10-CM

## 2024-08-26 DIAGNOSIS — I420 Dilated cardiomyopathy: Secondary | ICD-10-CM

## 2024-08-26 DIAGNOSIS — R972 Elevated prostate specific antigen [PSA]: Secondary | ICD-10-CM | POA: Diagnosis not present

## 2024-08-26 DIAGNOSIS — N401 Enlarged prostate with lower urinary tract symptoms: Secondary | ICD-10-CM

## 2024-08-26 DIAGNOSIS — F319 Bipolar disorder, unspecified: Secondary | ICD-10-CM | POA: Diagnosis not present

## 2024-08-26 DIAGNOSIS — N184 Chronic kidney disease, stage 4 (severe): Secondary | ICD-10-CM | POA: Diagnosis not present

## 2024-08-26 DIAGNOSIS — I442 Atrioventricular block, complete: Secondary | ICD-10-CM

## 2024-08-26 DIAGNOSIS — I2510 Atherosclerotic heart disease of native coronary artery without angina pectoris: Secondary | ICD-10-CM

## 2024-08-26 DIAGNOSIS — I5022 Chronic systolic (congestive) heart failure: Secondary | ICD-10-CM | POA: Diagnosis not present

## 2024-08-26 DIAGNOSIS — Z8673 Personal history of transient ischemic attack (TIA), and cerebral infarction without residual deficits: Secondary | ICD-10-CM | POA: Diagnosis not present

## 2024-08-26 DIAGNOSIS — R634 Abnormal weight loss: Secondary | ICD-10-CM | POA: Insufficient documentation

## 2024-08-26 MED ORDER — TAMSULOSIN HCL 0.4 MG PO CAPS: 0.4000 mg | ORAL_CAPSULE | Freq: Every morning | ORAL | 1 refills | Status: AC

## 2024-08-26 MED ORDER — CLOPIDOGREL BISULFATE 75 MG PO TABS: 75.0000 mg | ORAL_TABLET | Freq: Every morning | ORAL | 1 refills | Status: AC

## 2024-08-26 MED ORDER — ROSUVASTATIN CALCIUM 40 MG PO TABS: 40.0000 mg | ORAL_TABLET | Freq: Every day | ORAL | 1 refills | Status: AC

## 2024-08-26 NOTE — Progress Notes (Signed)
 Name: Jeffrey [facility]   MRN: [id number]    DOB: [birth year]   Date:08/26/2024       Progress Note  Subjective  Chief Complaint  Chief Complaint  Patient presents with   Medical Management of Chronic Issues   Discussed the use of AI scribe software for clinical note transcription with the patient, who gave verbal consent to proceed.  History of Present Illness MAR Jeffrey Herring is a 73 year old male with chronic heart conditions and chronic kidney disease who presents for a follow-up visit.  He has a history of dilated cardiomyopathy, sinoatrial node dysfunction, coronary artery disease, and congestive heart failure. A stent was placed in 2014, and he has had multiple pacemaker placements due to infections, with the first pacemaker placed in 1983. He takes Plavix , rosuvastatin , Jardiance , and metoprolol  for his heart conditions. No issues with his pacemaker since the last visit. An echocardiogram in June showed an ejection fraction of 25-30%. He underwent ablation for typical atrial flutter in March 2024 and follows up with a heart failure specialist regularly.  He has chronic kidney disease, previously stage four, with the last kidney function check in May. His nephrologist monitors his condition. His calcium  levels have been high, with a recent level of 11.6. He is not currently on diuretics  due to his kidney condition.  He has a history of elevated PSA levels, with the last recorded at 14.75 in May. He has experienced significant weight loss and decreased appetite, raising concerns about possible prostate cancer. He has not had a prostate biopsy yet. He has seen a urologist, but no recent follow-up has occurred.  He has a history of bipolar disorder and is under the care of a psychiatrist who recently reduced his medication dosage from 100 mg to 50 mg to help with kidney function.   He has a history of stroke but reports no current symptoms. He takes Plavix  as a blood thinner. His hypertension  is well controlled.    Patient Active Problem List   Diagnosis Date Noted   B12 deficiency 08/17/2023   Vitamin D  deficiency 08/17/2023   History of CVA in adulthood 08/16/2022   Polyp of sigmoid colon    Nodule of rectum    Polyp of ascending colon    Hyperparathyroidism, secondary renal 07/06/2020   Nasal congestion with rhinorrhea 11/06/2018   Chronic kidney disease, stage 4 (severe) (HCC) 10/24/2017   Dilated cardiomyopathy (HCC) 02/18/2017   BPH (benign prostatic hyperplasia) 11/04/2015   Chronic systolic heart failure (HCC) 10/28/2015   HLD (hyperlipidemia) 10/28/2015   H/O deep venous thrombosis 10/28/2015   Personal history of other venous thrombosis and embolism 10/28/2015   History of sepsis 04/09/2014   Inguinal hernia 01/06/2014   Fitting or adjustment of cardiac pacemaker 01/05/2014   Complete atrioventricular block (HCC) 10/14/2013   Sinoatrial node dysfunction (HCC) 10/14/2013   Kidney cysts 05/06/2013   Hypertension, benign 10/01/2012   Healed or old pulmonary embolism 09/30/2012   Artificial cardiac pacemaker 09/30/2012   History of endocarditis 09/30/2012   Bipolar 1 disorder (HCC) 05/01/2012   Arteriosclerosis of coronary artery 06/16/2009    Past Surgical History:  Procedure Laterality Date   BACK SURGERY     CARDIAC SURGERY     COLONOSCOPY WITH PROPOFOL  N/A 11/26/2020   Procedure: COLONOSCOPY WITH PROPOFOL ;  Surgeon: Janalyn Keene NOVAK, MD;  Location: [facility] END[facility]COPY;  Service: Endoscopy;  Laterality: N/A;   HERNIA REPAIR Right 02/05/14   NOSE SURGERY  PACEMAKER PLACEMENT     SPINE SURGERY      Family History  Problem Relation Age of Onset   Depression Mother     Social History   Tobacco Use   Smoking status: Former    Types: Cigars    Quit date: 02/05/2018    Years since quitting: 6.5    Passive exposure: Past   Smokeless tobacco: Never   Tobacco comments:    smoking cessation materials not required  Substance Use Topics   Alcohol  use: No    Alcohol/week: 0.0 standard drinks of alcohol     Current Outpatient Medications:    ALPRAZolam  (XANAX ) 0.5 MG tablet, Take 1 tablet (0.5 mg total) by mouth 3 (three) times daily., Disp: 90 tablet, Rfl: 5   cholecalciferol (VITAMIN D ) 1000 units tablet, Take 1,000 Units by mouth daily., Disp: , Rfl:    clopidogrel  (PLAVIX ) 75 MG tablet, Take 1 tablet (75 mg total) by mouth every morning., Disp: 90 tablet, Rfl: 0   cyanocobalamin  (VITAMIN B12) 1000 MCG tablet, Take 1,000 mcg by mouth daily., Disp: , Rfl:    JARDIANCE  10 MG TABS tablet, Take one tablet by mouth daily., Disp: 90 tablet, Rfl: 1   lamoTRIgine  (LAMICTAL ) 25 MG tablet, Take 2 tablets (50 mg total) by mouth daily., Disp: 60 tablet, Rfl: 5   metoprolol  succinate (TOPROL -XL) 25 MG 24 hr tablet, Take 25 mg by mouth daily. Wife reports pt now taking 1 full tablet, Disp: , Rfl:    OLANZapine  (ZYPREXA ) 5 MG tablet, TAKE 1 TABLET BY MOUTH ATBEDTIME., Disp: 90 tablet, Rfl: 1   rosuvastatin  (CRESTOR ) 40 MG tablet, Take 1 tablet (40 mg total) by mouth daily., Disp: 90 tablet, Rfl: 0   sodium bicarbonate  650 MG tablet, Take 650 mg by mouth daily., Disp: , Rfl:    tamsulosin  (FLOMAX ) 0.4 MG CAPS capsule, Take 1 capsule (0.4 mg total) by mouth every morning., Disp: 90 capsule, Rfl: 0  Allergies  Allergen Reactions   Aspirin  Hives, Shortness Of Breath and Palpitations   Vancomycin Rash    See ID note from 11/24/2008, with subsequent severe relapse December 2023 following a single dose of vanc   Codeine Nausea And Vomiting   Lithium       acute renal failure   Meperidine Nausea And Vomiting    I personally reviewed active problem list, medication list, allergies, family history with the patient/caregiver today.   ROS  Ten systems reviewed and is negative except as mentioned in HPI    Objective Physical Exam MEASUREMENTS: Weight- 163. CONSTITUTIONAL: Patient appears well-developed and well-nourished.  No distress. HEENT: Head  atraumatic, normocephalic, neck supple. CARDIOVASCULAR: Normal rate, regular rhythm and normal heart sounds.  No murmur heard. No BLE edema. PULMONARY: Effort normal and breath sounds normal. No respiratory distress. ABDOMINAL: There is no tenderness or distention. MUSCULOSKELETAL: Normal gait. Without gross motor or sensory deficit. PSYCHIATRIC: Patient has a normal mood and affect. behavior is normal. Judgment and thought content normal.  Vitals:   08/26/24 1053  BP: 110/70  Pulse: 80  Resp: 18  SpO2: 93%  Weight: 163 lb 14.4 oz (74.3 kg)  Height: 5' 10 (1.778 m)    Body mass index is 23.52 kg/m.  Recent Results (from the past 2160 hours)  ECHOCARDIOGRAM COMPLETE     Status: None   Collection Time: 07/17/24 11:10 AM  Result Value Ref Range   AV Peak grad 3.5 mmHg   Ao pk vel 0.94 m/s   S'  Lateral 4.30 cm   Area-P 1/2 3.08 cm2   AV Mean grad 2.0 mmHg   Single Plane A4C EF 34.9 %   Single Plane A2C EF 23.0 %   Calc EF 27.3 %   Est EF 25 - 30%      PHQ2/9:    08/26/2024   10:48 AM 02/28/2024   10:09 AM 02/20/2024   11:13 AM 02/20/2024   11:08 AM 02/22/2023   10:37 AM  Depression screen PHQ 2/9  Decreased Interest 0 0 0 0 0  Down, Depressed, Hopeless 0 0 0 0 0  PHQ - 2 Score 0 0 0 0 0  Altered sleeping  0 0    Tired, decreased energy  0 0    Change in appetite  0 0    Feeling bad or failure about yourself   0 0    Trouble concentrating  0 0    Moving slowly or fidgety/restless  0 0    Suicidal thoughts  0 0    PHQ-9 Score  0  0     Difficult doing work/chores  Not difficult at all Not difficult at all       Data saved with a previous flowsheet row definition    phq 9 is negative  Fall Risk:    08/26/2024   10:48 AM 02/28/2024   10:10 AM 02/20/2024   11:08 AM 08/17/2023   10:54 AM 02/22/2023   10:36 AM  Fall Risk   Falls in the past year? 0 0 0 0 1  Number falls in past yr: 0 0 0  0  Injury with Fall? 0 0 0  0  Risk for fall due to : No Fall Risks No Fall  Risks No Fall Risks No Fall Risks No Fall Risks  Follow up Falls evaluation completed Falls prevention discussed;Falls evaluation completed Falls prevention discussed;Education provided;Falls evaluation completed Falls prevention discussed;Education provided;Falls evaluation completed Falls prevention discussed;Education provided     Assessment & Plan Chronic systolic heart failure with dilated cardiomyopathy, complete atrioventricular block status post pacemaker, atrial flutter status post ablation, and atherosclerotic heart disease Chronic heart failure with low ejection fraction (25-30%). Pacemaker functioning well. ( Presence of heart assistance device)  - Continue Jardiance  10 mg daily. - Continue metoprolol  25 mg daily. - Monitor weight; use diuretics if weight gain exceeds 2 pounds. - Annual follow-up with heart failure specialist for device check.  Chronic kidney disease stage 4 with secondary hyperparathyroidism of renal origin Stage 4 CKD with secondary hyperparathyroidism. Elevated calcium  (11.6) likely due to renal dysfunction. PTH level 112. No primary hyperparathyroidism. - Monitor kidney function and calcium  levels. - Consult nephrologist for hyperparathyroidism and kidney disease management.  Elevated prostate specific antigen (PSA) with unintentional weight loss and malnutrition, rule out prostate cancer Elevated PSA (14.75) with weight loss and malnutrition. Differential includes prostate cancer. No prior biopsy due to pacemaker. - Order PSA test. - Refer to urologist for prostate biopsy. - Discussed biopsy need and prostate cancer implications.  Benign prostatic hyperplasia with lower urinary tract symptoms Benign prostatic hyperplasia with lower urinary tract symptoms. - Continue Flomax  for urinary symptoms.  Cognitive dysfunction Persistent cognitive dysfunction. Previous MRI evaluation. No mood changes. - Continue current management and  monitoring.  Hypertension - Continue current antihypertensive regimen.

## 2024-08-27 ENCOUNTER — Ambulatory Visit: Payer: Self-pay | Admitting: Family Medicine

## 2024-08-27 LAB — BASIC METABOLIC PANEL WITH GFR
BUN/Creatinine Ratio: 7 (calc) (ref 6–22)
BUN: 28 mg/dL — ABNORMAL HIGH (ref 7–25)
CO2: 20 mmol/L (ref 20–32)
Calcium: 11 mg/dL — ABNORMAL HIGH (ref 8.6–10.3)
Chloride: 102 mmol/L (ref 98–110)
Creat: 3.9 mg/dL — ABNORMAL HIGH (ref 0.70–1.28)
Glucose, Bld: 105 mg/dL — ABNORMAL HIGH (ref 65–99)
Potassium: 4.2 mmol/L (ref 3.5–5.3)
Sodium: 139 mmol/L (ref 135–146)
eGFR: 15 mL/min/1.73m2 — ABNORMAL LOW (ref >=60)

## 2024-08-27 LAB — PSA: PSA: 17.19 ng/mL — ABNORMAL HIGH (ref <=4.00)

## 2024-09-24 ENCOUNTER — Ambulatory Visit (INDEPENDENT_AMBULATORY_CARE_PROVIDER_SITE_OTHER): Admitting: Urology

## 2024-09-24 VITALS — BP 115/73 | HR 95 | Ht 70.0 in | Wt 159.9 lb

## 2024-09-24 DIAGNOSIS — R972 Elevated prostate specific antigen [PSA]: Secondary | ICD-10-CM

## 2024-09-24 DIAGNOSIS — R399 Unspecified symptoms and signs involving the genitourinary system: Secondary | ICD-10-CM | POA: Diagnosis not present

## 2024-09-24 NOTE — Progress Notes (Signed)
° °  09/24/2024 9:47 AM   Jeffrey [facility] 12-24-1950 [id number]  Reason for visit: Follow up elevated PSA, urinary symptoms  History: Comorbid 73 year old male, history notable for bipolar disorder, diabetes, depression, history of DVT/PE, CKD with eGFR 15 and pacemaker who remains on anticoagulation Elevated PSA of 12.1 in 2022 and I recommended a prostate MRI, this was never completed He returned May 2023 when PSA remained elevated at 11.6, DRE was benign, I again recommended prostate MRI but this was never completed Has been on Flomax  long-term for mild to moderate obstructive symptoms  Physical Exam: BP 115/73 (BP Location: Left Arm, Patient Position: Sitting, Cuff Size: Normal)   Pulse 95   Ht 5' 10 (1.778 m)   Wt 159 lb 14.4 oz (72.5 kg)   SpO2 90%   BMI 22.94 kg/m   Imaging/labs: Most recent PSA November 2025 up to 17.2 from 14.21 Feb 2024, 9.19 Feb 2023, 11.18 Feb 2022, 25 June 2020  Today: History again obtained primarily from his wife today, after long conversation she prefers more of a watchful waiting type approach and would like to avoid any invasive treatments.  Even if he was diagnosed with prostate cancer, she would defer hormone treatment, radiation or other treatment options.  They have had recent discussions about going on dialysis with his worsening renal function Not particularly bothered by urinary symptoms at this time and remains on Flomax   Plan:   Elevated PSA: Has been elevated since at least 2021 when PSA was 10, has had a slow increase since that time, most recently 17 from November 2025.  Previously recommended prostate MRI but this has never been completed.  We discussed AUA guidelines that do not recommend routine screening in men over age 40, especially those with other multiple comorbidities and life expectancy less than 10 years which he would certainly fall into that category.  We reviewed options including prostate MRI, prostate biopsy, or more of a  watchful waiting approach with PSA monitoring.  Also challenging his cannot get cross-sectional imaging with contrast in the setting of his severe CKD.  She is fairly adamant they would defer any treatment even if he was diagnosed with prostate cancer, which I think is very reasonable with his other comorbidities. RTC lab visit 3 to 4 months repeat PSA, if stable will move forward with more of watchful waiting type approach and yearly PSA monitoring   Jeffrey JAYSON Burnet, MD  [facility] [address] [place], [place] [address] [phone]

## 2024-09-24 NOTE — Patient Instructions (Signed)
 Your PSA has been chronically elevated, unclear if this is related to prostate cancer or just an enlarged prostate or benign process.  With your other medical problems I think it is very reasonable to pursue more of a watchful waiting approach and monitor the PSA levels, unfortunately even if you had prostate cancer those treatments can have pretty significant side effects that impact the quality of life.  With your kidney disease you are much more likely to pass away from something completely unrelated to the prostate in the next 10 years.  Prostate Cancer  The prostate is a small gland that helps make semen. It is located below a man's bladder, in front of the rectum. Prostate cancer is when abnormal cells grow in this gland. What are the causes? The cause of this condition is not known. What increases the risk? Being age 23 or older. Having a family history of prostate cancer. Having a family history of cancer of the breasts or ovaries. Having genes that are passed from parent to child (inherited). Having Lynch syndrome. African American men and men of African descent are diagnosed with prostate cancer at higher rates than other men. What are the signs or symptoms? Problems peeing (urinating). This may include: A stream that is weak, or pee that stops and starts. Trouble starting or stopping your pee. Trouble emptying all of your pee. Needing to pee more often, especially at night. Blood in your pee or semen. Pain in the: Lower back. Lower belly (abdomen). Hips. Trouble getting an erection. Weakness or numbness in the legs or feet. How is this treated? Treatment for this condition depends on: How much the cancer has spread. Your age. The kind of treatment you want. Your health. Treatments include: Being watched. This is called observation. You will be tested from time to time, but you will not get treated. Tests are to make sure that the cancer is not growing. Surgery. This may  be done to: Take out (remove) the prostate. Freeze and kill cancer cells. Radiation. This uses a strong beam of energy to kill cancer cells. Chemotherapy. This uses medicines that stop cancer cells from increasing. This kills cancer cells and healthy cells. Hormone treatment. This stops the body from making hormones that help the cancer cells grow. Follow these instructions at home: Lifestyle Do not smoke or use any products that contain nicotine or tobacco. If you need help quitting, ask your doctor. Eat a healthy diet. Treatment may affect your ability to have sex. If you have a partner, touch, hold, hug, and caress your partner to have intimate moments. Get plenty of sleep. Ask your doctor for help to find a support group for men with prostate cancer. General instructions Take over-the-counter and prescription medicines only as told by your doctor. If you have to go to the hospital, let your cancer doctor (oncologist) know. Keep all follow-up visits. Where to find more information American Cancer Society: www.cancer.org American Society of Clinical Oncology: www.cancer.net [employer]: www.cancer.gov Contact a doctor if: You have new or more trouble peeing. You have new or more blood in your pee. You have new or more pain in your hips, back, or chest. Get help right away if: You have weakness in your legs. You lose feeling in your legs. You cannot control your pee or your poop (stool). You have chills or a fever. Summary The prostate is a male gland that helps make semen. Prostate cancer is when abnormal cells grow in this gland. Treatment includes  doing surgery, using medicines, using strong beams of energy, or watching without treatment. Ask your doctor for help to find a support group for men with prostate cancer. Contact a doctor if you have problems peeing or have any new pain that you did not have before. This information is not intended to replace advice  given to you by your health care provider. Make sure you discuss any questions you have with your health care provider. Document Revised: 12/29/2020 Document Reviewed: 12/29/2020 Elsevier Patient Education  2024 [employer].

## 2024-10-22 ENCOUNTER — Telehealth: Payer: Self-pay

## 2024-10-22 ENCOUNTER — Other Ambulatory Visit (HOSPITAL_COMMUNITY): Payer: Self-pay

## 2024-10-22 NOTE — Telephone Encounter (Signed)
 [facility] Patient Advocate Encounter  Patient was electronically approved to receive Jardiance  from [employer] Effective 10/22/2024 to 10/22/2025  Patient spouse informed by phone.  Rachel DEL, CPhT Rx Patient Advocate Phone: [phone]

## 2024-11-21 ENCOUNTER — Telehealth: Payer: Self-pay | Admitting: Family

## 2024-11-21 NOTE — Telephone Encounter (Signed)
 Called to confirm/remind patient of their appointment at [facility] on 11/24/24.   Appointment:   [x] Confirmed  [] Left mess   [] No answer/No voice mail  [] VM Full/unable to leave message  [] Phone not in service  Patient reminded to bring all medications and/or complete list.  Confirmed patient has transportation. Gave directions, instructed to utilize valet parking.

## 2024-11-24 ENCOUNTER — Ambulatory Visit: Admitting: Family

## 2024-11-25 ENCOUNTER — Encounter: Admitting: Family

## 2025-01-09 ENCOUNTER — Other Ambulatory Visit

## 2025-02-11 ENCOUNTER — Telehealth (HOSPITAL_COMMUNITY): Admitting: Psychiatry

## 2025-02-23 ENCOUNTER — Ambulatory Visit: Admitting: Family Medicine

## 2025-03-06 ENCOUNTER — Ambulatory Visit
